# Patient Record
Sex: Male | Born: 1945 | Race: White | Hispanic: No | Marital: Married | Smoking: Former smoker
Health system: Southern US, Community
[De-identification: ages and names within clinical notes are randomized; demographics above are authoritative.]

## PROBLEM LIST (undated history)

## (undated) DIAGNOSIS — L8990 Pressure ulcer of unspecified site, unspecified stage: Secondary | ICD-10-CM

## (undated) DIAGNOSIS — I83009 Varicose veins of unspecified lower extremity with ulcer of unspecified site: Secondary | ICD-10-CM

## (undated) DIAGNOSIS — E785 Hyperlipidemia, unspecified: Secondary | ICD-10-CM

## (undated) DIAGNOSIS — L97909 Non-pressure chronic ulcer of unspecified part of unspecified lower leg with unspecified severity: Secondary | ICD-10-CM

## (undated) DIAGNOSIS — L0390 Cellulitis, unspecified: Secondary | ICD-10-CM

## (undated) DIAGNOSIS — I10 Essential (primary) hypertension: Secondary | ICD-10-CM

## (undated) DIAGNOSIS — R600 Localized edema: Secondary | ICD-10-CM

## (undated) DIAGNOSIS — G4730 Sleep apnea, unspecified: Secondary | ICD-10-CM

## (undated) DIAGNOSIS — I82409 Acute embolism and thrombosis of unspecified deep veins of unspecified lower extremity: Secondary | ICD-10-CM

## (undated) DIAGNOSIS — I4891 Unspecified atrial fibrillation: Secondary | ICD-10-CM

## (undated) DIAGNOSIS — N200 Calculus of kidney: Secondary | ICD-10-CM

## (undated) DIAGNOSIS — E119 Type 2 diabetes mellitus without complications: Secondary | ICD-10-CM

## (undated) DIAGNOSIS — I2721 Secondary pulmonary arterial hypertension: Secondary | ICD-10-CM

## (undated) DIAGNOSIS — R339 Retention of urine, unspecified: Secondary | ICD-10-CM

## (undated) HISTORY — PX: HERNIA REPAIR: SHX51

---

## 1992-04-10 DIAGNOSIS — I82409 Acute embolism and thrombosis of unspecified deep veins of unspecified lower extremity: Secondary | ICD-10-CM

## 1992-04-10 HISTORY — DX: Acute embolism and thrombosis of unspecified deep veins of unspecified lower extremity: I82.409

## 1998-03-11 ENCOUNTER — Ambulatory Visit (HOSPITAL_BASED_OUTPATIENT_CLINIC_OR_DEPARTMENT_OTHER): Admission: RE | Admit: 1998-03-11 | Discharge: 1998-03-11 | Payer: Self-pay | Admitting: Oral Surgery

## 1998-09-08 ENCOUNTER — Ambulatory Visit (HOSPITAL_COMMUNITY): Admission: RE | Admit: 1998-09-08 | Discharge: 1998-09-09 | Payer: Self-pay | Admitting: *Deleted

## 2001-05-16 ENCOUNTER — Encounter: Admission: RE | Admit: 2001-05-16 | Discharge: 2001-05-16 | Payer: Self-pay | Admitting: Internal Medicine

## 2003-06-23 ENCOUNTER — Ambulatory Visit (HOSPITAL_COMMUNITY): Admission: RE | Admit: 2003-06-23 | Discharge: 2003-06-23 | Payer: Self-pay | Admitting: Orthopaedic Surgery

## 2010-06-19 ENCOUNTER — Emergency Department (INDEPENDENT_AMBULATORY_CARE_PROVIDER_SITE_OTHER): Payer: Medicare Other

## 2010-06-19 ENCOUNTER — Emergency Department (HOSPITAL_BASED_OUTPATIENT_CLINIC_OR_DEPARTMENT_OTHER)
Admission: EM | Admit: 2010-06-19 | Discharge: 2010-06-19 | Disposition: A | Discharge status: Home / Self Care | Payer: Medicare Other | Attending: Emergency Medicine | Admitting: Emergency Medicine

## 2010-06-19 DIAGNOSIS — S1093XA Contusion of unspecified part of neck, initial encounter: Secondary | ICD-10-CM | POA: Insufficient documentation

## 2010-06-19 DIAGNOSIS — H0589 Other disorders of orbit: Secondary | ICD-10-CM

## 2010-06-19 DIAGNOSIS — Z79899 Other long term (current) drug therapy: Secondary | ICD-10-CM | POA: Insufficient documentation

## 2010-06-19 DIAGNOSIS — I4891 Unspecified atrial fibrillation: Secondary | ICD-10-CM | POA: Insufficient documentation

## 2010-06-19 DIAGNOSIS — Y92009 Unspecified place in unspecified non-institutional (private) residence as the place of occurrence of the external cause: Secondary | ICD-10-CM | POA: Insufficient documentation

## 2010-06-19 DIAGNOSIS — S0003XA Contusion of scalp, initial encounter: Secondary | ICD-10-CM

## 2010-06-19 DIAGNOSIS — I10 Essential (primary) hypertension: Secondary | ICD-10-CM | POA: Insufficient documentation

## 2010-06-19 DIAGNOSIS — H05819 Cyst of unspecified orbit: Secondary | ICD-10-CM | POA: Insufficient documentation

## 2010-06-19 DIAGNOSIS — W010XXA Fall on same level from slipping, tripping and stumbling without subsequent striking against object, initial encounter: Secondary | ICD-10-CM

## 2010-06-19 DIAGNOSIS — E119 Type 2 diabetes mellitus without complications: Secondary | ICD-10-CM | POA: Insufficient documentation

## 2010-06-19 DIAGNOSIS — W1809XA Striking against other object with subsequent fall, initial encounter: Secondary | ICD-10-CM | POA: Insufficient documentation

## 2010-06-19 DIAGNOSIS — W07XXXA Fall from chair, initial encounter: Secondary | ICD-10-CM

## 2010-06-19 LAB — DIFFERENTIAL
Basophils Absolute: 0.1 10*3/uL (ref 0.0–0.1)
Basophils Relative: 1 % (ref 0–1)
Eosinophils Absolute: 0.1 10*3/uL (ref 0.0–0.7)
Neutro Abs: 8.9 10*3/uL — ABNORMAL HIGH (ref 1.7–7.7)
Neutrophils Relative %: 80 % — ABNORMAL HIGH (ref 43–77)

## 2010-06-19 LAB — COMPREHENSIVE METABOLIC PANEL
ALT: 25 U/L (ref 0–53)
AST: 23 U/L (ref 0–37)
Albumin: 4.3 g/dL (ref 3.5–5.2)
Alkaline Phosphatase: 76 U/L (ref 39–117)
CO2: 26 mEq/L (ref 19–32)
Chloride: 104 mEq/L (ref 96–112)
Creatinine, Ser: 1.1 mg/dL (ref 0.4–1.5)
GFR calc Af Amer: >60 mL/min (ref >60)
Potassium: 4.4 mEq/L (ref 3.5–5.1)
Sodium: 141 mEq/L (ref 135–145)
Total Bilirubin: 1.3 mg/dL — ABNORMAL HIGH (ref 0.3–1.2)

## 2010-06-19 LAB — CBC
Hemoglobin: 14.4 g/dL (ref 13.0–17.0)
Platelets: 321 10*3/uL (ref 150–400)
RBC: 4.99 MIL/uL (ref 4.22–5.81)
WBC: 11.2 10*3/uL — ABNORMAL HIGH (ref 4.0–10.5)

## 2010-06-19 MED ORDER — IOHEXOL 300 MG/ML  SOLN
90.0000 mL | Freq: Once | INTRAMUSCULAR | Status: AC | PRN
  Administered 2010-06-19: 90 mL via INTRAVENOUS

## 2011-01-10 ENCOUNTER — Encounter (HOSPITAL_BASED_OUTPATIENT_CLINIC_OR_DEPARTMENT_OTHER): Payer: Medicare Other

## 2011-02-14 ENCOUNTER — Encounter (HOSPITAL_BASED_OUTPATIENT_CLINIC_OR_DEPARTMENT_OTHER): Payer: Medicare Other

## 2011-10-14 ENCOUNTER — Encounter (HOSPITAL_COMMUNITY): Payer: Self-pay | Admitting: *Deleted

## 2011-10-14 ENCOUNTER — Inpatient Hospital Stay (HOSPITAL_COMMUNITY)
Admission: EM | Admit: 2011-10-14 | Discharge: 2011-10-18 | DRG: 603 | Disposition: A | Discharge status: Home / Self Care | Payer: Medicare Other | Attending: Internal Medicine | Admitting: Internal Medicine

## 2011-10-14 DIAGNOSIS — I83009 Varicose veins of unspecified lower extremity with ulcer of unspecified site: Secondary | ICD-10-CM | POA: Diagnosis present

## 2011-10-14 DIAGNOSIS — Z6841 Body Mass Index (BMI) 40.0 and over, adult: Secondary | ICD-10-CM

## 2011-10-14 DIAGNOSIS — L97909 Non-pressure chronic ulcer of unspecified part of unspecified lower leg with unspecified severity: Secondary | ICD-10-CM | POA: Diagnosis present

## 2011-10-14 DIAGNOSIS — R339 Retention of urine, unspecified: Secondary | ICD-10-CM | POA: Diagnosis not present

## 2011-10-14 DIAGNOSIS — I4891 Unspecified atrial fibrillation: Secondary | ICD-10-CM | POA: Diagnosis present

## 2011-10-14 DIAGNOSIS — E871 Hypo-osmolality and hyponatremia: Secondary | ICD-10-CM | POA: Diagnosis present

## 2011-10-14 DIAGNOSIS — Z86718 Personal history of other venous thrombosis and embolism: Secondary | ICD-10-CM

## 2011-10-14 DIAGNOSIS — I2789 Other specified pulmonary heart diseases: Secondary | ICD-10-CM | POA: Diagnosis present

## 2011-10-14 DIAGNOSIS — N19 Unspecified kidney failure: Secondary | ICD-10-CM

## 2011-10-14 DIAGNOSIS — Z79899 Other long term (current) drug therapy: Secondary | ICD-10-CM

## 2011-10-14 DIAGNOSIS — E872 Acidosis, unspecified: Secondary | ICD-10-CM | POA: Diagnosis present

## 2011-10-14 DIAGNOSIS — E861 Hypovolemia: Secondary | ICD-10-CM | POA: Diagnosis present

## 2011-10-14 DIAGNOSIS — L0390 Cellulitis, unspecified: Secondary | ICD-10-CM

## 2011-10-14 DIAGNOSIS — Z7901 Long term (current) use of anticoagulants: Secondary | ICD-10-CM

## 2011-10-14 DIAGNOSIS — N400 Enlarged prostate without lower urinary tract symptoms: Secondary | ICD-10-CM | POA: Diagnosis present

## 2011-10-14 DIAGNOSIS — I2721 Secondary pulmonary arterial hypertension: Secondary | ICD-10-CM | POA: Diagnosis present

## 2011-10-14 DIAGNOSIS — Z87442 Personal history of urinary calculi: Secondary | ICD-10-CM

## 2011-10-14 DIAGNOSIS — E860 Dehydration: Secondary | ICD-10-CM

## 2011-10-14 DIAGNOSIS — L0291 Cutaneous abscess, unspecified: Secondary | ICD-10-CM

## 2011-10-14 DIAGNOSIS — L03116 Cellulitis of left lower limb: Secondary | ICD-10-CM | POA: Diagnosis present

## 2011-10-14 DIAGNOSIS — R3911 Hesitancy of micturition: Secondary | ICD-10-CM

## 2011-10-14 DIAGNOSIS — L02419 Cutaneous abscess of limb, unspecified: Principal | ICD-10-CM | POA: Diagnosis present

## 2011-10-14 DIAGNOSIS — L03119 Cellulitis of unspecified part of limb: Principal | ICD-10-CM

## 2011-10-14 DIAGNOSIS — E119 Type 2 diabetes mellitus without complications: Secondary | ICD-10-CM | POA: Diagnosis present

## 2011-10-14 DIAGNOSIS — N179 Acute kidney failure, unspecified: Secondary | ICD-10-CM | POA: Diagnosis present

## 2011-10-14 DIAGNOSIS — R600 Localized edema: Secondary | ICD-10-CM | POA: Diagnosis present

## 2011-10-14 DIAGNOSIS — R110 Nausea: Secondary | ICD-10-CM | POA: Diagnosis present

## 2011-10-14 DIAGNOSIS — I959 Hypotension, unspecified: Secondary | ICD-10-CM | POA: Diagnosis present

## 2011-10-14 HISTORY — DX: Calculus of kidney: N20.0

## 2011-10-14 HISTORY — DX: Morbid (severe) obesity due to excess calories: E66.01

## 2011-10-14 HISTORY — DX: Unspecified atrial fibrillation: I48.91

## 2011-10-14 HISTORY — DX: Non-pressure chronic ulcer of unspecified part of unspecified lower leg with unspecified severity: I83.009

## 2011-10-14 HISTORY — DX: Localized edema: R60.0

## 2011-10-14 HISTORY — DX: Type 2 diabetes mellitus without complications: E11.9

## 2011-10-14 HISTORY — DX: Retention of urine, unspecified: R33.9

## 2011-10-14 HISTORY — DX: Acute embolism and thrombosis of unspecified deep veins of unspecified lower extremity: I82.409

## 2011-10-14 HISTORY — DX: Essential (primary) hypertension: I10

## 2011-10-14 HISTORY — DX: Varicose veins of unspecified lower extremity with ulcer of unspecified site: L97.909

## 2011-10-14 HISTORY — DX: Secondary pulmonary arterial hypertension: I27.21

## 2011-10-14 HISTORY — DX: Hyperlipidemia, unspecified: E78.5

## 2011-10-14 LAB — CBC WITH DIFFERENTIAL/PLATELET
Basophils Absolute: 0.1 10*3/uL (ref 0.0–0.1)
Basophils Relative: 0 % (ref 0–1)
Eosinophils Absolute: 0.2 10*3/uL (ref 0.0–0.7)
Hemoglobin: 13.3 g/dL (ref 13.0–17.0)
MCH: 29.7 pg (ref 26.0–34.0)
MCHC: 33.5 g/dL (ref 30.0–36.0)
Monocytes Relative: 6 % (ref 3–12)
Neutrophils Relative %: 82 % — ABNORMAL HIGH (ref 43–77)
RDW: 14.1 % (ref 11.5–15.5)

## 2011-10-14 LAB — BASIC METABOLIC PANEL
BUN: 47 mg/dL — ABNORMAL HIGH (ref 6–23)
Creatinine, Ser: 2.35 mg/dL — ABNORMAL HIGH (ref 0.50–1.35)
GFR calc Af Amer: 32 mL/min — ABNORMAL LOW (ref >90)
GFR calc non Af Amer: 27 mL/min — ABNORMAL LOW (ref >90)
Potassium: 5.1 mEq/L (ref 3.5–5.1)

## 2011-10-14 LAB — TSH: TSH: 1.743 u[IU]/mL (ref 0.350–4.500)

## 2011-10-14 LAB — LIPASE, BLOOD: Lipase: 38 U/L (ref 11–59)

## 2011-10-14 LAB — HEPATIC FUNCTION PANEL
Alkaline Phosphatase: 74 U/L (ref 39–117)
Indirect Bilirubin: 0.3 mg/dL (ref 0.3–0.9)
Total Protein: 7.2 g/dL (ref 6.0–8.3)

## 2011-10-14 LAB — LACTIC ACID, PLASMA: Lactic Acid, Venous: 1.2 mmol/L (ref 0.5–2.2)

## 2011-10-14 MED ORDER — SODIUM CHLORIDE 0.9 % IV SOLN
INTRAVENOUS | Status: DC
  Administered 2011-10-14 – 2011-10-17 (×7): via INTRAVENOUS

## 2011-10-14 MED ORDER — MORPHINE SULFATE 4 MG/ML IJ SOLN
4.0000 mg | Freq: Once | INTRAMUSCULAR | Status: AC
  Administered 2011-10-14: 4 mg via INTRAVENOUS
  Filled 2011-10-14: qty 1

## 2011-10-14 MED ORDER — DILTIAZEM HCL 30 MG PO TABS
30.0000 mg | ORAL_TABLET | Freq: Four times a day (QID) | ORAL | Status: DC
  Administered 2011-10-14 – 2011-10-15 (×5): 30 mg via ORAL
  Filled 2011-10-14 (×6): qty 1

## 2011-10-14 MED ORDER — ALBUTEROL SULFATE (5 MG/ML) 0.5% IN NEBU: 2.5000 mg | INHALATION_SOLUTION | RESPIRATORY_TRACT | Status: DC | PRN

## 2011-10-14 MED ORDER — DOCUSATE SODIUM 100 MG PO CAPS
100.0000 mg | ORAL_CAPSULE | Freq: Two times a day (BID) | ORAL | Status: DC
  Administered 2011-10-14 – 2011-10-18 (×8): 100 mg via ORAL
  Filled 2011-10-14 (×12): qty 1

## 2011-10-14 MED ORDER — VANCOMYCIN HCL 1000 MG IV SOLR
1500.0000 mg | Freq: Once | INTRAVENOUS | Status: AC
  Administered 2011-10-14: 1500 mg via INTRAVENOUS
  Filled 2011-10-14: qty 1500

## 2011-10-14 MED ORDER — ACETAMINOPHEN 650 MG RE SUPP: 650.0000 mg | Freq: Four times a day (QID) | RECTAL | Status: DC | PRN

## 2011-10-14 MED ORDER — OXYCODONE HCL 5 MG PO TABS
5.0000 mg | ORAL_TABLET | ORAL | Status: DC | PRN
  Administered 2011-10-15 – 2011-10-16 (×4): 10 mg via ORAL
  Administered 2011-10-16: 5 mg via ORAL
  Administered 2011-10-17: 10 mg via ORAL
  Administered 2011-10-18: 5 mg via ORAL
  Filled 2011-10-14 (×2): qty 2
  Filled 2011-10-14: qty 1
  Filled 2011-10-14 (×3): qty 2
  Filled 2011-10-14: qty 1
  Filled 2011-10-14: qty 2

## 2011-10-14 MED ORDER — DABIGATRAN ETEXILATE MESYLATE 150 MG PO CAPS
150.0000 mg | ORAL_CAPSULE | Freq: Two times a day (BID) | ORAL | Status: DC
  Administered 2011-10-14 – 2011-10-18 (×8): 150 mg via ORAL
  Filled 2011-10-14 (×12): qty 1

## 2011-10-14 MED ORDER — VANCOMYCIN HCL IN DEXTROSE 1-5 GM/200ML-% IV SOLN
1000.0000 mg | Freq: Once | INTRAVENOUS | Status: AC
  Administered 2011-10-14: 1000 mg via INTRAVENOUS
  Filled 2011-10-14: qty 200

## 2011-10-14 MED ORDER — SODIUM CHLORIDE 0.9 % IV BOLUS (SEPSIS): 250.0000 mL | Freq: Once | INTRAVENOUS | Status: DC

## 2011-10-14 MED ORDER — ONDANSETRON HCL 4 MG PO TABS: 4.0000 mg | ORAL_TABLET | Freq: Four times a day (QID) | ORAL | Status: DC | PRN

## 2011-10-14 MED ORDER — DABIGATRAN ETEXILATE MESYLATE 150 MG PO CAPS
ORAL_CAPSULE | ORAL | Status: AC
  Filled 2011-10-14: qty 1

## 2011-10-14 MED ORDER — ALUM & MAG HYDROXIDE-SIMETH 200-200-20 MG/5ML PO SUSP: 30.0000 mL | Freq: Four times a day (QID) | ORAL | Status: DC | PRN

## 2011-10-14 MED ORDER — ONDANSETRON HCL 4 MG/2ML IJ SOLN: 4.0000 mg | Freq: Four times a day (QID) | INTRAMUSCULAR | Status: DC | PRN

## 2011-10-14 MED ORDER — INSULIN ASPART 100 UNIT/ML ~~LOC~~ SOLN
0.0000 [IU] | Freq: Every day | SUBCUTANEOUS | Status: DC
  Administered 2011-10-14: 2 [IU] via SUBCUTANEOUS

## 2011-10-14 MED ORDER — SIMVASTATIN 10 MG PO TABS
10.0000 mg | ORAL_TABLET | Freq: Every day | ORAL | Status: DC
  Administered 2011-10-14 – 2011-10-18 (×5): 10 mg via ORAL
  Filled 2011-10-14 (×7): qty 1

## 2011-10-14 MED ORDER — SODIUM CHLORIDE 0.9 % IV BOLUS (SEPSIS): 1000.0000 mL | Freq: Once | INTRAVENOUS | Status: DC

## 2011-10-14 MED ORDER — VANCOMYCIN HCL 1000 MG IV SOLR
2000.0000 mg | INTRAVENOUS | Status: DC
  Filled 2011-10-14: qty 2000

## 2011-10-14 MED ORDER — MORPHINE SULFATE 4 MG/ML IJ SOLN
4.0000 mg | INTRAMUSCULAR | Status: DC | PRN
  Administered 2011-10-14 – 2011-10-18 (×4): 4 mg via INTRAVENOUS
  Filled 2011-10-14 (×4): qty 1

## 2011-10-14 MED ORDER — ACETAMINOPHEN 325 MG PO TABS: 650.0000 mg | ORAL_TABLET | Freq: Four times a day (QID) | ORAL | Status: DC | PRN

## 2011-10-14 MED ORDER — ADULT MULTIVITAMIN W/MINERALS CH
1.0000 | ORAL_TABLET | Freq: Every day | ORAL | Status: DC
  Administered 2011-10-14 – 2011-10-18 (×5): 1 via ORAL
  Filled 2011-10-14 (×5): qty 1

## 2011-10-14 MED ORDER — INSULIN ASPART 100 UNIT/ML ~~LOC~~ SOLN
0.0000 [IU] | Freq: Three times a day (TID) | SUBCUTANEOUS | Status: DC
  Administered 2011-10-15 – 2011-10-16 (×2): 2 [IU] via SUBCUTANEOUS
  Administered 2011-10-16 (×2): 1 [IU] via SUBCUTANEOUS
  Administered 2011-10-17: 2 [IU] via SUBCUTANEOUS
  Administered 2011-10-17: 1 [IU] via SUBCUTANEOUS
  Administered 2011-10-17: 4 [IU] via SUBCUTANEOUS
  Administered 2011-10-18: 3 [IU] via SUBCUTANEOUS
  Administered 2011-10-18 (×2): 1 [IU] via SUBCUTANEOUS

## 2011-10-14 NOTE — Progress Notes (Signed)
Pt states that he has "had trouble" with his legs since 2010, but they have been much worse over the past month and a half. Pt states that he recently decided to apply silvadene to both legs with a clean paint brush. Pt has bilateral redness, and 3+ edema. Left lower leg has skin sloughing off, both legs are weeping.

## 2011-10-14 NOTE — Progress Notes (Signed)
Noted to nurse pt hr 130, no resp distress, no chest pain , only increased hr probable due to discomfort. Pt is obese

## 2011-10-14 NOTE — ED Notes (Signed)
Pt states weeping to the legs. NAD. Poor historian.

## 2011-10-14 NOTE — Progress Notes (Signed)
ANTIBIOTIC CONSULT NOTE - INITIAL  Pharmacy Consult for Vancomycin Indication: cellulitis   Allergies  Allergen Reactions  . Furosemide Other (See Comments)    Dizziness , drowsiness    Patient Measurements: Height: 6\' 1"  (185.4 cm) Weight: 355 lb (161.027 kg) IBW/kg (Calculated) : 79.9    Vital Signs: Temp: 97.7 F (36.5 C) (07/06 1315) Temp src: Oral (07/06 1315) BP: 95/53 mmHg (07/06 1502) Pulse Rate: 77  (07/06 1502) Intake/Output from previous day:   Intake/Output from this shift:    Labs:  Basename 10/14/11 1247  WBC 13.5*  HGB 13.3  PLT 394  LABCREA --  CREATININE 2.35*   Estimated Creatinine Clearance: 49.1 ml/min (by C-G formula based on Cr of 2.35). No results found for this basename: VANCOTROUGH:2,VANCOPEAK:2,VANCORANDOM:2,GENTTROUGH:2,GENTPEAK:2,GENTRANDOM:2,TOBRATROUGH:2,TOBRAPEAK:2,TOBRARND:2,AMIKACINPEAK:2,AMIKACINTROU:2,AMIKACIN:2, in the last 72 hours   Microbiology: Recent Results (from the past 720 hour(s))  CULTURE, BLOOD (ROUTINE X 2)     Status: Normal (Preliminary result)   Collection Time   10/14/11  1:55 PM      Component Value Range Status Comment   Specimen Description BLOOD LEFT HAND   Final    Special Requests BOTTLES DRAWN AEROBIC ONLY 4CC   Final    Culture PENDING   Incomplete    Report Status PENDING   Incomplete     Medical History: Past Medical History  Diagnosis Date  . Type 2 diabetes mellitus   . Hypertension   . Bilateral lower extremity edema   . Venous stasis ulcers   . Atrial fibrillation   . Hyperlipidemia   . Morbid obesity   . DVT (deep venous thrombosis) 1994    LLE. Tx with coumadin  . Kidney stones     Medications:  Prescriptions prior to admission  Medication Sig Dispense Refill  . aspirin 325 MG tablet Take 81.25 mg by mouth daily. Takes one-quarter tablet (81.25 mg) daily      . cephALEXin (KEFLEX) 500 MG capsule Take 500 mg by mouth 2 (two) times daily. Started on September 24, 2011      . dabigatran  (PRADAXA) 150 MG CAPS Take 150 mg by mouth every 12 (twelve) hours.      Marland Kitchen diltiazem (TIAZAC) 360 MG 24 hr capsule Take 360 mg by mouth daily.      . Flaxseed, Linseed, (FLAX SEED OIL) 1000 MG CAPS Take 1 capsule by mouth daily.      Marland Kitchen glipiZIDE (GLUCOTROL XL) 10 MG 24 hr tablet Take 10 mg by mouth 2 (two) times daily.      . pioglitazone (ACTOS) 45 MG tablet Take 45 mg by mouth daily.      . pravastatin (PRAVACHOL) 20 MG tablet Take 20 mg by mouth daily.      . quinapril-hydrochlorothiazide (ACCURETIC) 20-12.5 MG per tablet Take 1 tablet by mouth 2 (two) times daily.      . silver sulfADIAZINE (SILVADENE) 1 % cream Apply 1 application topically as needed. Application to legs as needed daily      . Wound Dressings (UNNA-FLEX ELASTIC UNNA BOOT EX) Apply 1 application topically daily.        Assessment: Okay for Protocol Received Vancomycin 1000mg  in ED, Obese DM male with weeping cellulitis Estimated Creatinine Clearance: 49.1 ml/min (by C-G formula based on Cr of 2.35).   Goal of Therapy:  Vancomycin trough = 10-15  Plan:  Additional 1500mg  IV Vancomycin this afternoon for a total load of 2500mg . Vancomycin 2000mg  IV every 24 hours starting tomorrow. Trough at steady state.  Mady Gemma 10/14/2011,5:03 PM

## 2011-10-14 NOTE — ED Provider Notes (Signed)
History  This chart was scribed for Joya Gaskins, MD by Erskine Emery. This patient was seen in room APA03/[facility]03 and the patient's care was started at 11:19AM.  CSN: [id number]  Arrival date & time 10/14/11  1052   First MD Initiated Contact with Patient 10/14/11 1119      Chief Complaint  Patient presents with  . Legs weeping      The history is provided by the patient. No language interpreter was used.    Jonathan Wise is a 66 y.o. male who presents to the Emergency Department complaining of left leg weeping, onset over a month ago. Pt reports he repeatedly soaks through multiple bandages and that recently he attached diapers to his legs to soak up leakage that resulted in deterioration of the skin. Pt reports dizziness, nausea, blurred vision, and weakness as associated symptoms. Pt also reports chronic thirst attributed to DM (drinks multiple bottles of Gatorade without quenched thirst). Pt denies emesis, SOB, and abdominal pain. Pt is currently on antibiotics. Pt has a h/o DM and HTN. Pt is currently on blood thinners, 150 mg of Pradaxa, he is supposed to take 2x/day but takes them 1x/day. Rest improves symptoms Walking worsens symptoms   Dr. Andrey Campanile is the pt's PCP.   Past Medical History  Diagnosis Date  . Diabetes mellitus   . Hypertension   . Poor historian     Past Surgical History  Procedure Date  . Hernia repair     No family history on file.  History  Substance Use Topics  . Smoking status: Never Smoker   . Smokeless tobacco: Not on file  . Alcohol Use: No      Review of Systems  Constitutional: Negative for fever and chills.  Eyes: Positive for visual disturbance.  Respiratory: Negative for shortness of breath.   Gastrointestinal: Positive for nausea. Negative for vomiting and abdominal pain.  Skin: Positive for wound (chronic leg swelling and weeping).  Neurological: Positive for dizziness and weakness.  All other systems reviewed and are  negative.    Allergies  Furosemide  Home Medications  No current outpatient prescriptions on file.  Triage Vitals: Ht 6\' 1"  (1.854 m)  Wt 355 lb (161.027 kg)  BMI 46.84 kg/m2 BP 95/53  Pulse 77  Temp 97.7 F (36.5 C) (Oral)  Resp 18  Ht 6\' 1"  (1.854 m)  Wt 355 lb (161.027 kg)  BMI 46.84 kg/m2  SpO2 99%   Physical Exam CONSTITUTIONAL: Well developed/well nourished HEAD AND FACE: Normocephalic/atraumatic EYES: EOMI/PERRL ENMT: Mucous membranes moist NECK: supple no meningeal signs SPINE:entire spine nontender CV: S1/S2 noted, no murmurs/rubs/gallops noted LUNGS: Lungs are clear to auscultation bilaterally, no apparent distress ABDOMEN: soft, nontender, no rebound or guarding, obese GU:no cva tenderness NEURO: Pt is awake/alert, moves all extremitiesx4 EXTREMITIES: pulses normal, full ROM, bilateral lower extremity edema, with the left greater than the right, redness excoriation and skin breakdown to calf surface, overlying erythema noted.  Distally he has no numbness and foot is warm to touch.  No crepitance noted SKIN: warm, color normal PSYCH: no abnormalities of mood noted   ED Course  Procedures   COORDINATION OF CARE:  12:12PM: I discussed a treatment plan of blood work and admission to the hospital (at least 48 hours, possibly less) with the pt and pt agreed.   2:05 PM BP low but pt does not appear septic, IV fluids ordered and lactate ordered 3:22 PM Vitals improved Lactate normal He responded to fluid He is  nontoxic Doubt deep space infection/necrotizing fascitis Will admit to tele after discussion with triad  MDM  Nursing notes including past medical history and social history reviewed and considered in documentation All labs/vitals reviewed and considered     I personally performed the services described in this documentation, which was scribed in my presence. The recorded information has been reviewed and considered.         Joya Gaskins, MD 10/14/11 [phone]

## 2011-10-14 NOTE — H&P (Addendum)
Jonathan Wise MRN: [id number] DOB/AGE: 67-Feb-1947 67 y.o. Primary Care Physician: Benedetto Goad, M.D. [facility]. Admit date: 10/14/2011 Chief Complaint: Left leg ulcerations, left leg weeping, and pain. HPI: The patient is a 66 year old man with a past medical history significant for venous stasis ulcers of the left leg, remote left leg DVT, diabetes mellitus, and paroxysmal atrial fibrillation and anticoagulated with Pradaxa. He presents today with a chief complaint of worsening left leg weeping, redness, and pain. He has had left leg ulcerations on and off for the past few years. When the drainage and ulcerations worsen, he treats them with Unna boot dressings and antibiotics prescribed by his primary care physician. Recently, he had been prescribed Keflex which he has taken on and off for the past 3 weeks 2-3 times daily. Over the last week to 2 weeks, he has had to change the Unna boot on his left leg on almost a daily basis due to the increased drainage. Before, he could leave it on at least 3-5 days before changing it. When the [employer] was unsuccessful, he bought diapers. After he applied to the diapers to his leg with tape, they were actually effective in absorbing the drainage, but they pulled off a lot of his skin. He has also used Silvadene and a zinc infused ointment over the past week. The drainage has been serous. He denies pustular drainage. He has had some nausea but no vomiting. He has had generalize malaise and generalized weakness. He denies fever, chills, diarrhea, abdominal pain, pain with urination, chest pain, or shortness of breath.   In the emergency department, he was initially hypotensive with a blood pressure of 79/50. Following a liter of IV fluids, his blood pressure improved to 95/53. He is afebrile. His lab data are significant for a serum sodium of 128, potassium of 5.1, BUN of 47, creatinine of 2.35, glucose 145, and WBC of 13.2. His lactic acid is within  normal limits at 1.2. His EKG reveals atrial fibrillation with a heart rate of 105 beats per minute. He is being admitted for further evaluation and management.  Past Medical History  Diagnosis Date  . Type 2 diabetes mellitus   . Hypertension   . Bilateral lower extremity edema   . Venous stasis ulcers   . Atrial fibrillation   . Hyperlipidemia   . Morbid obesity   . DVT (deep venous thrombosis) 1994    LLE. Tx with coumadin  . Kidney stones     Past Surgical History  Procedure Date  . Hernia repair     Umbilical    Prior to Admission medications   Medication Sig Start Date End Date Taking? Authorizing Provider  aspirin 325 MG tablet Take 81.25 mg by mouth daily. Takes one-quarter tablet (81.25 mg) daily   Yes Historical Provider, MD  cephALEXin (KEFLEX) 500 MG capsule Take 500 mg by mouth 2 (two) times daily. Started on September 24, 2011   Yes Historical Provider, MD  dabigatran (PRADAXA) 150 MG CAPS Take 150 mg by mouth every 12 (twelve) hours.   Yes Historical Provider, MD  diltiazem (TIAZAC) 360 MG 24 hr capsule Take 360 mg by mouth daily.   Yes Historical Provider, MD  Flaxseed, Linseed, (FLAX SEED OIL) 1000 MG CAPS Take 1 capsule by mouth daily.   Yes Historical Provider, MD  glipiZIDE (GLUCOTROL XL) 10 MG 24 hr tablet Take 10 mg by mouth 2 (two) times daily.   Yes Historical Provider, MD  pioglitazone (ACTOS) 45 MG  tablet Take 45 mg by mouth daily.   Yes Historical Provider, MD  pravastatin (PRAVACHOL) 20 MG tablet Take 20 mg by mouth daily.   Yes Historical Provider, MD  quinapril-hydrochlorothiazide (ACCURETIC) 20-12.5 MG per tablet Take 1 tablet by mouth 2 (two) times daily.   Yes Historical Provider, MD  silver sulfADIAZINE (SILVADENE) 1 % cream Apply 1 application topically as needed. Application to legs as needed daily   Yes Historical Provider, MD  Wound Dressings (UNNA-FLEX ELASTIC UNNA BOOT EX) Apply 1 application topically daily.   Yes Historical Provider, MD     Allergies:  Allergies  Allergen Reactions  . Furosemide Other (See Comments)    Dizziness , drowsiness   Family history: His mother is deceased. He does not recall the etiology of her death. His father is also deceased. He died of complications of pneumonia and emphysema.  Social History: He is married. He lives in [place]. He stopped smoking in 2005 after smoking 3-4 packs of cigarettes per day. He denies alcohol and illicit drug use. He is a retired [employer].      ROS: As above in history present illness. In addition, his blood sugars have been ranging in the 80s to 90s at home. He has had no problems with urination. He has noticed a slight decrease in his urine output. Otherwise, review of systems is negative.  PHYSICAL EXAM: Blood pressure 95/53, pulse 77, temperature 97.7 F (36.5 C), temperature source Oral, resp. rate 18, height 6\' 1"  (1.854 m), weight 161.027 kg (355 lb), SpO2 99.00%.  General: Pleasant alert morbidly obese 66 year old Caucasian man sitting up in bed, in no acute distress. HEENT: Head is normocephalic, nontraumatic. Pupils are equal, round, and reactive to light. Extraocular movements are intact. Conjunctivae are clear. Sclerae are white. Oropharynx reveals dry mucous membranes. Very poor dentition with cavities and broken off teeth noted. No posterior exudates or erythema. Neck: Supple, no adenopathy, no thyromegaly, no JVD. Lungs: Clear to auscultation bilaterally. Heart: Irregular, irregular, with borderline tachycardia. Abdomen: Morbidly obese, positive bowel sounds, soft, nontender, nondistended. GU and rectal are deferred. Extremities: Left leg with some discoloration from topical Silvadene and brawny skin changes.. Otherwise, multiple superficial and stage II ulcerations on the leg below the knee. General moderate erythema between and over the ulcerations on the left leg below-the-knee. 2+ nonpitting edema. Serous non-malodorous drainage.  Moderately tender to palpation. Left foot with  calluses on the plantar surface and erythema over the dorsal surface, no active drainage from the feet. Right lower extremity with 1+ nonpitting edema and brawny skin changes. Neurologic: He is alert and oriented x3. Cranial nerves II through XII are intact. Strength is 5 over 5 throughout except the left lower extremity which she finds difficult to lift because of the edema and discomfort. Sensation grossly intact throughout. Psychiatric: He has a pleasant affect. He is cooperative. Speech is clear.  Basic Metabolic Panel:  Basename 10/14/11 1247  NA 128*  K 5.1  CL 97  CO2 17*  GLUCOSE 145*  BUN 47*  CREATININE 2.35*  CALCIUM 10.0  MG --  PHOS --   Liver Function Tests: No results found for this basename: AST:2,ALT:2,ALKPHOS:2,BILITOT:2,PROT:2,ALBUMIN:2 in the last 72 hours No results found for this basename: LIPASE:2,AMYLASE:2 in the last 72 hours No results found for this basename: AMMONIA:2 in the last 72 hours CBC:  Basename 10/14/11 1247  WBC 13.5*  NEUTROABS 11.0*  HGB 13.3  HCT 39.7  MCV 88.6  PLT 394   Cardiac Enzymes:  No results found for this basename: CKTOTAL:3,CKMB:3,CKMBINDEX:3,TROPONINI:3 in the last 72 hours BNP: No results found for this basename: PROBNP:3 in the last 72 hours D-Dimer: No results found for this basename: DDIMER:2 in the last 72 hours CBG: No results found for this basename: GLUCAP:6 in the last 72 hours Hemoglobin A1C: No results found for this basename: HGBA1C in the last 72 hours Fasting Lipid Panel: No results found for this basename: CHOL,HDL,LDLCALC,TRIG,CHOLHDL,LDLDIRECT in the last 72 hours Thyroid Function Tests: No results found for this basename: TSH,T4TOTAL,FREET4,T3FREE,THYROIDAB in the last 72 hours Anemia Panel: No results found for this basename: VITAMINB12,FOLATE,FERRITIN,TIBC,IRON,RETICCTPCT in the last 72 hours Coagulation: No results found for this basename:  LABPROT:2,INR:2 in the last 72 hours Urine Drug Screen: Drugs of Abuse  No results found for this basename: labopia, cocainscrnur, labbenz, amphetmu, thcu, labbarb    Alcohol Level: No results found for this basename: ETH:2 in the last 72 hours Urinalysis: No results found for this basename: COLORURINE:2,APPERANCEUR:2,LABSPEC:2,PHURINE:2,GLUCOSEU:2,HGBUR:2,BILIRUBINUR:2,KETONESUR:2,PROTEINUR:2,UROBILINOGEN:2,NITRITE:2,LEUKOCYTESUR:2 in the last 72 hours Misc. Labs:  EKG: Atrial fibrillation with a heart rate of 105 beats per minute.  Recent Results (from the past 240 hour(s))  CULTURE, BLOOD (ROUTINE X 2)     Status: Normal (Preliminary result)   Collection Time   10/14/11  1:55 PM      Component Value Range Status Comment   Specimen Description BLOOD LEFT HAND   Final    Special Requests BOTTLES DRAWN AEROBIC ONLY 4CC   Final    Culture PENDING   Incomplete    Report Status PENDING   Incomplete      Results for orders placed during the hospital encounter of 10/14/11 (from the past 48 hour(s))  CBC WITH DIFFERENTIAL     Status: Abnormal   Collection Time   10/14/11 12:47 PM      Component Value Range Comment   WBC 13.5 (*) 4.0 - 10.5 K/uL    RBC 4.48  4.22 - 5.81 MIL/uL    Hemoglobin 13.3  13.0 - 17.0 g/dL    HCT 16.1  09.6 - 04.5 %    MCV 88.6  78.0 - 100.0 fL    MCH 29.7  26.0 - 34.0 pg    MCHC 33.5  30.0 - 36.0 g/dL    RDW 40.9  81.1 - 91.4 %    Platelets 394  150 - 400 K/uL    Neutrophils Relative 82 (*) 43 - 77 %    Neutro Abs 11.0 (*) 1.7 - 7.7 K/uL    Lymphocytes Relative 11 (*) 12 - 46 %    Lymphs Abs 1.5  0.7 - 4.0 K/uL    Monocytes Relative 6  3 - 12 %    Monocytes Absolute 0.9  0.1 - 1.0 K/uL    Eosinophils Relative 1  0 - 5 %    Eosinophils Absolute 0.2  0.0 - 0.7 K/uL    Basophils Relative 0  0 - 1 %    Basophils Absolute 0.1  0.0 - 0.1 K/uL   BASIC METABOLIC PANEL     Status: Abnormal   Collection Time   10/14/11 12:47 PM      Component Value Range Comment    Sodium 128 (*) 135 - 145 mEq/L    Potassium 5.1  3.5 - 5.1 mEq/L    Chloride 97  96 - 112 mEq/L    CO2 17 (*) 19 - 32 mEq/L    Glucose, Bld 145 (*) 70 - 99 mg/dL    BUN  47 (*) 6 - 23 mg/dL    Creatinine, Ser 4.09 (*) 0.50 - 1.35 mg/dL    Calcium 81.1  8.4 - 10.5 mg/dL    GFR calc non Af Amer 27 (*) >90 mL/min    GFR calc Af Amer 32 (*) >90 mL/min   LACTIC ACID, PLASMA     Status: Normal   Collection Time   10/14/11  1:54 PM      Component Value Range Comment   Lactic Acid, Venous 1.2  0.5 - 2.2 mmol/L   CULTURE, BLOOD (ROUTINE X 2)     Status: Normal (Preliminary result)   Collection Time   10/14/11  1:55 PM      Component Value Range Comment   Specimen Description BLOOD LEFT HAND      Special Requests BOTTLES DRAWN AEROBIC ONLY 4CC      Culture PENDING      Report Status PENDING       No results found.  Impression:  Principal Problem:  *Cellulitis of left leg Active Problems:  Venous stasis ulcers  Bilateral lower extremity edema  Hyponatremia  Hypotension  Hypovolemia  Acute renal failure  Atrial fibrillation  Nausea  Type 2 diabetes mellitus    1. Left lower extremity cellulitis with concomitant acute on chronic venous stasis ulcers and edema. The patient has been self treating with Unna boots. He has also been taking Keflex which was prescribed by his primary care physician 2-3 weeks ago. He is obviously failed outpatient therapy as the ulcerations are weeping/draining more.  2. Hypotension. This is likely secondary to hypovolemia in the setting of antihypertensive medication therapy with an ACE inhibitor and diuretic.  3. Acute renal failure. This is likely secondary to hypovolemia in the setting of ACE inhibitor therapy and diuretic therapy. Also, consider acute renal insufficiency from Keflex and hypoperfusion. Baseline creatinine is unknown, however, the patient reports no history of chronic kidney disease.   4. Chronic atrial fibrillation, chronically  anticoagulated on Pradaxa. He is on diltiazem for rate control. His heart rate is slightly elevated, but this could be mostly secondary to hypovolemia and volume contraction.  5. Type 2 diabetes mellitus. His venous glucose is reasonable. He is treated chronically with glipizide and Actos.  6. Nausea. Likely secondary to overall general malaise, acute renal failure, and cellulitis.    Plan: 1. The patient received a 1 L bolus of normal saline. We'll continue IV fluid hydration with normal saline at 150 cc an hour. 2. We'll hold diltiazem, quinapril, and HCTZ. If his heart rate becomes uncontrolled, will start digoxin or a smaller dose of diltiazem if his blood pressure improves. 3. The patient was given IV vancomycin. We'll continue vancomycin for treatment of cellulitis. 4. We'll hold glipizide and Actos and treat his diabetes with sliding scale NovoLog to avoid symptomatic hypoglycemia. 5. Strict ins and outs. We'll monitor his renal function closely. 6. Symptomatic treatment with analgesics and antiemetics as needed. 7. We'll consult the wound care nurse. 8. For further evaluation, we'll order hepatic function panel, lipase, TSH, hemoglobin A1c, PT/PTT. We'll also order a venous ultrasound of his left leg to rule out DVT.      Yesenia Fontenette 10/14/2011, 4:46 PM

## 2011-10-15 ENCOUNTER — Other Ambulatory Visit (HOSPITAL_COMMUNITY): Payer: Medicare Other

## 2011-10-15 ENCOUNTER — Encounter (HOSPITAL_COMMUNITY): Payer: Self-pay | Admitting: Internal Medicine

## 2011-10-15 ENCOUNTER — Inpatient Hospital Stay (HOSPITAL_COMMUNITY): Payer: Medicare Other

## 2011-10-15 DIAGNOSIS — R3911 Hesitancy of micturition: Secondary | ICD-10-CM

## 2011-10-15 DIAGNOSIS — I4891 Unspecified atrial fibrillation: Secondary | ICD-10-CM

## 2011-10-15 DIAGNOSIS — L97909 Non-pressure chronic ulcer of unspecified part of unspecified lower leg with unspecified severity: Secondary | ICD-10-CM

## 2011-10-15 DIAGNOSIS — R339 Retention of urine, unspecified: Secondary | ICD-10-CM

## 2011-10-15 HISTORY — DX: Retention of urine, unspecified: R33.9

## 2011-10-15 LAB — COMPREHENSIVE METABOLIC PANEL
AST: 32 U/L (ref 0–37)
BUN: 51 mg/dL — ABNORMAL HIGH (ref 6–23)
CO2: 21 mEq/L (ref 19–32)
Chloride: 105 mEq/L (ref 96–112)
Creatinine, Ser: 2.01 mg/dL — ABNORMAL HIGH (ref 0.50–1.35)
GFR calc non Af Amer: 33 mL/min — ABNORMAL LOW (ref >90)
Total Bilirubin: 0.6 mg/dL (ref 0.3–1.2)

## 2011-10-15 LAB — URINALYSIS, ROUTINE W REFLEX MICROSCOPIC
Glucose, UA: NEGATIVE mg/dL
Ketones, ur: NEGATIVE mg/dL
Leukocytes, UA: NEGATIVE
Nitrite: NEGATIVE
Protein, ur: NEGATIVE mg/dL
pH: 5.5 (ref 5.0–8.0)

## 2011-10-15 LAB — CBC
MCH: 28.6 pg (ref 26.0–34.0)
Platelets: 323 10*3/uL (ref 150–400)
RBC: 3.99 MIL/uL — ABNORMAL LOW (ref 4.22–5.81)
WBC: 10.7 10*3/uL — ABNORMAL HIGH (ref 4.0–10.5)

## 2011-10-15 LAB — GLUCOSE, CAPILLARY
Glucose-Capillary: 91 mg/dL (ref 70–99)
Glucose-Capillary: 168 mg/dL — ABNORMAL HIGH (ref 70–99)
Glucose-Capillary: 164 mg/dL — ABNORMAL HIGH (ref 70–99)

## 2011-10-15 LAB — PROTIME-INR: Prothrombin Time: 20.6 seconds — ABNORMAL HIGH (ref 11.6–15.2)

## 2011-10-15 MED ORDER — DIGOXIN 125 MCG PO TABS
0.1250 mg | ORAL_TABLET | Freq: Every day | ORAL | Status: DC
  Filled 2011-10-15: qty 1

## 2011-10-15 MED ORDER — VANCOMYCIN HCL 1000 MG IV SOLR
1500.0000 mg | Freq: Two times a day (BID) | INTRAVENOUS | Status: DC
  Administered 2011-10-15 – 2011-10-16 (×2): 1500 mg via INTRAVENOUS
  Filled 2011-10-15 (×5): qty 1500

## 2011-10-15 MED ORDER — TAMSULOSIN HCL 0.4 MG PO CAPS: 0.4000 mg | ORAL_CAPSULE | Freq: Every day | ORAL | Status: DC

## 2011-10-15 MED ORDER — TAMSULOSIN HCL 0.4 MG PO CAPS
0.4000 mg | ORAL_CAPSULE | Freq: Every day | ORAL | Status: DC
  Administered 2011-10-15 – 2011-10-17 (×3): 0.4 mg via ORAL
  Filled 2011-10-15 (×3): qty 1

## 2011-10-15 MED ORDER — DIGOXIN 0.25 MG/ML IJ SOLN
0.1250 mg | Freq: Once | INTRAMUSCULAR | Status: AC
  Administered 2011-10-15: 0.125 mg via INTRAVENOUS
  Filled 2011-10-15: qty 2

## 2011-10-15 MED ORDER — SODIUM CHLORIDE 0.9 % IJ SOLN
INTRAMUSCULAR | Status: AC
  Administered 2011-10-15: 10:00:00
  Filled 2011-10-15: qty 3

## 2011-10-15 MED ORDER — SODIUM CHLORIDE 0.9 % IJ SOLN
INTRAMUSCULAR | Status: AC
  Administered 2011-10-15: 09:00:00
  Filled 2011-10-15: qty 3

## 2011-10-15 NOTE — Progress Notes (Signed)
Spoke with Dr Sherrie Mustache. Noted elevated heart rate. Added cardizem back but cut dose in half. Now takes 30 mg q 6 hours.

## 2011-10-15 NOTE — Progress Notes (Signed)
Hr 130's pt no chest pain, stated he has afib

## 2011-10-15 NOTE — Progress Notes (Signed)
Pt with low blood pressure, decrease output. Dr. Sherrie Mustache ordered I&O cath, obtained 650cc. Dr. Sherrie Mustache made aware. New order to insert Foley catheter.

## 2011-10-15 NOTE — Progress Notes (Signed)
Subjective: The patient says that the pain in his left leg is tolerable but pain medications are helping. He has had difficulty urinating overnight. He has no pain with urination. Overall, he had a good night sleep.  Objective: Vital signs in last 24 hours: Filed Vitals:   10/14/11 2003 10/14/11 2049 10/15/11 0449 10/15/11 0802  BP:  103/65 91/57   Pulse: 130 111 119 110  Temp:  97.6 F (36.4 C) 97.6 F (36.4 C)   TempSrc:  Oral Oral   Resp: 16 18 18 19   Height:      Weight:      SpO2: 98% 97% 95% 95%    Intake/Output Summary (Last 24 hours) at 10/15/11 1042 Last data filed at 10/15/11 0959  Gross per 24 hour  Intake   2040 ml  Output    350 ml  Net   1690 ml    Weight change:   Physical exam: General: Pleasant alert obese 66 year old Caucasian man sitting up in bed, in no acute distress. Lungs: Decreased breath sounds in the bases, otherwise clear. Heart: Irregular, irregular, with tachycardia. Abdomen: Morbidly obese, positive bowel sounds, soft, nontender, nondistended, but bladder may be full; unable to appreciate this fully due to morbid obesity. Extremities: Sloughed off skin from the multiple ulcerated lesions on the pretibial surfaces and the undersurface of the left leg. Some discoloration from Silvadene. Chronic brawny skin changes. Moderate erythema over the ulcerations and between ulcerations. 2+ nonpitting edema of the left leg. Some serous drainage mixed in with sloughed off skin and scant amount of blood, slightly malodorous. Right lower extremity with only a trace of pedal edema. Pedal pulses not obviously palpable but his feet are warm. Neurologic: He is alert and oriented x3. Cranial nerves II through XII are intact.   Lab Results: Basic Metabolic Panel:  Basename 10/15/11 0656 10/14/11 1247  NA 134* 128*  K 4.9 5.1  CL 105 97  CO2 21 17*  GLUCOSE 96 145*  BUN 51* 47*  CREATININE 2.01* 2.35*  CALCIUM 9.0 10.0  MG -- --  PHOS -- --   Liver  Function Tests:  Basename 10/15/11 0656 10/14/11 1700  AST 32 21  ALT 26 18  ALKPHOS 79 74  BILITOT 0.6 0.5  PROT 6.6 7.2  ALBUMIN 2.8* 2.9*    Basename 10/14/11 1700  LIPASE 38  AMYLASE --   No results found for this basename: AMMONIA:2 in the last 72 hours CBC:  Basename 10/15/11 0656 10/14/11 1247  WBC 10.7* 13.5*  NEUTROABS -- 11.0*  HGB 11.4* 13.3  HCT 36.3* 39.7  MCV 91.0 88.6  PLT 323 394   Cardiac Enzymes: No results found for this basename: CKTOTAL:3,CKMB:3,CKMBINDEX:3,TROPONINI:3 in the last 72 hours BNP: No results found for this basename: PROBNP:3 in the last 72 hours D-Dimer: No results found for this basename: DDIMER:2 in the last 72 hours CBG:  Basename 10/15/11 0719 10/14/11 2048  GLUCAP 91 225*   Hemoglobin A1C:  Basename 10/14/11 1247  HGBA1C 8.0*   Fasting Lipid Panel: No results found for this basename: CHOL,HDL,LDLCALC,TRIG,CHOLHDL,LDLDIRECT in the last 72 hours Thyroid Function Tests:  Basename 10/14/11 1247  TSH 1.743  T4TOTAL --  FREET4 --  T3FREE --  THYROIDAB --   Anemia Panel: No results found for this basename: VITAMINB12,FOLATE,FERRITIN,TIBC,IRON,RETICCTPCT in the last 72 hours Coagulation:  Basename 10/15/11 0656  LABPROT 20.6*  INR 1.73*   Urine Drug Screen: Drugs of Abuse  No results found for this basename: labopia, cocainscrnur, labbenz,  amphetmu, thcu, labbarb    Alcohol Level: No results found for this basename: ETH:2 in the last 72 hours Urinalysis:  Basename 10/15/11 0656  COLORURINE YELLOW  LABSPEC >1.030*  PHURINE 5.5  GLUCOSEU NEGATIVE  HGBUR NEGATIVE  BILIRUBINUR NEGATIVE  KETONESUR NEGATIVE  PROTEINUR NEGATIVE  UROBILINOGEN 1.0  NITRITE NEGATIVE  LEUKOCYTESUR NEGATIVE   Misc. Labs:   Micro: Recent Results (from the past 240 hour(s))  CULTURE, BLOOD (ROUTINE X 2)     Status: Normal (Preliminary result)   Collection Time   10/14/11  1:55 PM      Component Value Range Status Comment    Specimen Description BLOOD LEFT HAND   Final    Special Requests BOTTLES DRAWN AEROBIC ONLY 4CC   Final    Culture PENDING   Incomplete    Report Status PENDING   Incomplete     Studies/Results: US Venous Img Lower Unilateral Left  10/15/2011  *RADIOLOGY REPORT*  Clinical Data: Left leg pain and swelling, ulcerations, history of obesity and diabetes, evaluate for DVT.  LEFT LOWER EXTREMITY VENOUS DUPLEX ULTRASOUND  Technique:  Gray-scale sonography with graded compression, as well as color Doppler and duplex ultrasound were performed to evaluate the deep venous system of the lower extremity from the level of the common femoral vein through the popliteal and proximal calf veins. Spectral Doppler was utilized to evaluate flow at rest and with distal augmentation maneuvers.  Comparison:  None.  Findings:  Examination is degraded secondary to patient body habitus.  Normal compressibility of the common femoral, superficial femoral, and popliteal veins is demonstrated, as well as the visualized proximal calf veins.  No filling defects to suggest DVT on grayscale or color Doppler imaging.  Doppler waveforms show normal direction of venous flow, normal respiratory phasicity and response to augmentation.  IMPRESSION: No evidence of deep vein thrombosis within the left lower extremity.  Original Report Authenticated By: Waynard Reeds, M.D.    Medications:  Scheduled:   . dabigatran  150 mg Oral Q12H  . diltiazem  30 mg Oral QID  . docusate sodium  100 mg Oral BID  . insulin aspart  0-5 Units Subcutaneous QHS  . insulin aspart  0-9 Units Subcutaneous TID WC  .  morphine injection  4 mg Intravenous Once  . multivitamin with minerals  1 tablet Oral Daily  . simvastatin  10 mg Oral q1800  . sodium chloride  1,000 mL Intravenous Once  . sodium chloride  250 mL Intravenous Once  . sodium chloride      . sodium chloride      . vancomycin  1,500 mg Intravenous Once  . vancomycin  1,500 mg Intravenous Q12H    . vancomycin  1,000 mg Intravenous Once  . DISCONTD: vancomycin  2,000 mg Intravenous Q24H   Continuous:   . sodium chloride 150 mL/hr at 10/15/11 0251   KGM:[id number], acetaminophen, albuterol, alum & mag hydroxide-simeth, morphine injection, ondansetron (ZOFRAN) IV, ondansetron, oxyCODONE  Assessment: Principal Problem:  *Cellulitis of left leg Active Problems:  Venous stasis ulcers  Bilateral lower extremity edema  Hyponatremia  Hypotension  Hypovolemia  Acute renal failure  Atrial fibrillation with rapid ventricular response  Nausea  Type 2 diabetes mellitus  Urinary hesitancy    1. Left lower extremity cellulitis with concomitant acute on chronic venous stasis ulcers and edema. We'll continue vancomycin. Wound care consultation was ordered and is pending. We'll continue modest wound care without compressive dressings until the wound care nurse evaluate  the area.  Hypotension. This is secondary to hypovolemia in the setting of antihypertensive medication therapy with an ACE inhibitor and diuretic. He is on IV fluids for hydration. His blood pressure is improving. A lower dose of Cardizem has been started for attempted rate control for atrial fibrillation. Quinapril and hydrochlorothiazide are being held.  Hyponatremia, secondary to hypovolemia. Resolving on normal saline infusion  Chronic atrial fibrillation with rapid ventricular response. His heart rate has increased. Diltiazem was restarted at a lower dose as above. When his blood pressure improves, the dose of diltiazem will be increased. We'll add digoxin.  Type 2 diabetes mellitus. He is treated chronically with glipizide and Actos. His hemoglobin A1c is 8.0 indicating suboptimal control. He is being treated with sliding scale NovoLog only here and his capillary blood glucose is well within normal limits.  Acute renal failure. The patient's urine output has been marginal overnight. His BUN and creatinine have not  improved significantly. Possible bladder outlet obstruction is a concern.  Urinary hesitancy. The patient denied this on admission, but overnight, he has been having difficulty urinating. His urinalysis is not consistent with infection.  Nausea. Resolved. His LFTs and lipase are within normal limits. No abdominal pain on exam.  Plan:  1. Will asked the nursing staff to perform an in and out catheterization to assess for urinary retention. We'll start Flomax each bedtime empirically. 2. Continue IV fluid hydration. 3. We'll give the patient one IV dose of digoxin, followed by daily oral digoxin. Continue every 6 hour dosing of Cardizem at 30 mg. Titrate as his blood pressure improves. 4. Wound consult pending.   LOS: 1 day   Lakina Mcintire 10/15/2011, 10:42 AM

## 2011-10-15 NOTE — Progress Notes (Signed)
ANTIBIOTIC CONSULT NOTE - INITIAL  Pharmacy Consult for Vancomycin Indication: cellulitis   Allergies  Allergen Reactions  . Furosemide Other (See Comments)    Dizziness , drowsiness    Patient Measurements: Height: 6\' 1"  (185.4 cm) Weight: 372 lb 4.8 oz (168.874 kg) (Adjusted to 372 lbs (from erroneous entry of KG).) IBW/kg (Calculated) : 79.9    Vital Signs: Temp: 97.6 F (36.4 C) (07/07 0449) Temp src: Oral (07/07 0449) BP: 91/57 mmHg (07/07 0449) Pulse Rate: 110  (07/07 0802) Intake/Output from previous day: 07/06 0701 - 07/07 0700 In: 2040 [P.O.:240; I.V.:1800] Out: 150 [Urine:150] Intake/Output from this shift:    Labs:  [facility] 10/15/11 0656 10/14/11 1247  WBC 10.7* 13.5*  HGB 11.4* 13.3  PLT 323 394  LABCREA -- --  CREATININE 2.01* 2.35*   Estimated Creatinine Clearance: 59.1 ml/min (by C-G formula based on Cr of 2.01). No results found for this basename: VANCOTROUGH:2,VANCOPEAK:2,VANCORANDOM:2,GENTTROUGH:2,GENTPEAK:2,GENTRANDOM:2,TOBRATROUGH:2,TOBRAPEAK:2,TOBRARND:2,AMIKACINPEAK:2,AMIKACINTROU:2,AMIKACIN:2, in the last 72 hours   Microbiology: Recent Results (from the past 720 hour(s))  CULTURE, BLOOD (ROUTINE X 2)     Status: Normal (Preliminary result)   Collection Time   10/14/11  1:55 PM      Component Value Range Status Comment   Specimen Description BLOOD LEFT HAND   Final    Special Requests BOTTLES DRAWN AEROBIC ONLY 4CC   Final    Culture PENDING   Incomplete    Report Status PENDING   Incomplete     Medical History: Past Medical History  Diagnosis Date  . Type 2 diabetes mellitus   . Hypertension   . Bilateral lower extremity edema   . Venous stasis ulcers   . Atrial fibrillation   . Hyperlipidemia   . Morbid obesity   . DVT (deep venous thrombosis) 1994    LLE. Tx with coumadin  . Kidney stones     Medications:  Prescriptions prior to admission  Medication Sig Dispense Refill  . aspirin 325 MG tablet Take 81.25 mg by mouth  daily. Takes one-quarter tablet (81.25 mg) daily      . cephALEXin (KEFLEX) 500 MG capsule Take 500 mg by mouth 2 (two) times daily. Started on September 24, 2011      . dabigatran (PRADAXA) 150 MG CAPS Take 150 mg by mouth every 12 (twelve) hours.      Marland Kitchen diltiazem (TIAZAC) 360 MG 24 hr capsule Take 360 mg by mouth daily.      . Flaxseed, Linseed, (FLAX SEED OIL) 1000 MG CAPS Take 1 capsule by mouth daily.      Marland Kitchen glipiZIDE (GLUCOTROL XL) 10 MG 24 hr tablet Take 10 mg by mouth 2 (two) times daily.      . pioglitazone (ACTOS) 45 MG tablet Take 45 mg by mouth daily.      . pravastatin (PRAVACHOL) 20 MG tablet Take 20 mg by mouth daily.      . quinapril-hydrochlorothiazide (ACCURETIC) 20-12.5 MG per tablet Take 1 tablet by mouth 2 (two) times daily.      . silver sulfADIAZINE (SILVADENE) 1 % cream Apply 1 application topically as needed. Application to legs as needed daily      . Wound Dressings (UNNA-FLEX ELASTIC UNNA BOOT EX) Apply 1 application topically daily.        Assessment: Okay for Protocol Received Vancomycin 1000mg  in ED, Obese DM male with weeping cellulitis Estimated Creatinine Clearance: 59.1 ml/min (by C-G formula based on Cr of 2.01).   Goal of Therapy:  Vancomycin trough = 10-15  Plan:  Renal Function improved some, will change dose to 1500mg  IV every 12 hours. Trough at steady state.   Mady Gemma 10/15/2011,9:29 AM

## 2011-10-16 ENCOUNTER — Inpatient Hospital Stay (HOSPITAL_COMMUNITY): Payer: Medicare Other

## 2011-10-16 DIAGNOSIS — E872 Acidosis: Secondary | ICD-10-CM | POA: Diagnosis present

## 2011-10-16 LAB — BASIC METABOLIC PANEL
BUN: 34 mg/dL — ABNORMAL HIGH (ref 6–23)
Calcium: 8.9 mg/dL (ref 8.4–10.5)
Creatinine, Ser: 1.03 mg/dL (ref 0.50–1.35)
GFR calc Af Amer: 85 mL/min — ABNORMAL LOW (ref >90)
GFR calc non Af Amer: 74 mL/min — ABNORMAL LOW (ref >90)

## 2011-10-16 LAB — GLUCOSE, CAPILLARY: Glucose-Capillary: 139 mg/dL — ABNORMAL HIGH (ref 70–99)

## 2011-10-16 LAB — PRO B NATRIURETIC PEPTIDE: Pro B Natriuretic peptide (BNP): 695 pg/mL — ABNORMAL HIGH (ref 0–125)

## 2011-10-16 LAB — VANCOMYCIN, TROUGH: Vancomycin Tr: 23.6 ug/mL — ABNORMAL HIGH (ref 10.0–20.0)

## 2011-10-16 MED ORDER — DILTIAZEM HCL 60 MG PO TABS: 60.0000 mg | ORAL_TABLET | Freq: Four times a day (QID) | ORAL | Status: DC

## 2011-10-16 MED ORDER — VANCOMYCIN HCL 1000 MG IV SOLR
2000.0000 mg | INTRAVENOUS | Status: DC
  Administered 2011-10-16 – 2011-10-17 (×2): 2000 mg via INTRAVENOUS
  Filled 2011-10-16 (×3): qty 2000

## 2011-10-16 MED ORDER — DILTIAZEM HCL 60 MG PO TABS
60.0000 mg | ORAL_TABLET | Freq: Four times a day (QID) | ORAL | Status: DC
  Administered 2011-10-16 – 2011-10-17 (×5): 60 mg via ORAL
  Filled 2011-10-16 (×5): qty 1

## 2011-10-16 MED ORDER — METOPROLOL TARTRATE 25 MG PO TABS
25.0000 mg | ORAL_TABLET | Freq: Two times a day (BID) | ORAL | Status: DC
  Administered 2011-10-16 – 2011-10-18 (×4): 25 mg via ORAL
  Filled 2011-10-16 (×4): qty 1

## 2011-10-16 MED ORDER — DIGOXIN 250 MCG PO TABS: 0.2500 mg | ORAL_TABLET | Freq: Every day | ORAL | Status: DC

## 2011-10-16 MED ORDER — DIGOXIN 250 MCG PO TABS
0.2500 mg | ORAL_TABLET | Freq: Every day | ORAL | Status: DC
  Administered 2011-10-16 – 2011-10-18 (×3): 0.25 mg via ORAL
  Filled 2011-10-16 (×3): qty 1

## 2011-10-16 NOTE — Progress Notes (Signed)
PT applied new dressing to patients leg today but it is already weeping through the bandage. Scheduled to be changed on Wednesday but will need to be done each day. Please make sure patient is premedicated for pain prior to changing.

## 2011-10-16 NOTE — Progress Notes (Signed)
Physical Therapy Evaluation Patient Details Name: Jonathan Wise MRN: [id number] DOB: 1946-03-02 Today's Date: 10/16/2011 Time:  -     PT Assessment / Plan / Recommendation Clinical Impression       PT Assessment       Follow Up Recommendations       Barriers to Discharge        Equipment Recommendations       Recommendations for Other Services     Frequency      Precautions / Restrictions     Pertinent Vitals/Pain       Mobility       Exercises     PT Diagnosis:    PT Problem List:   PT Treatment Interventions:     PT Goals    Visit Information       Subjective Data      Prior Functioning       Cognition       Extremity/Trunk Assessment     Balance    End of Session    GP     Konrad Penta 10/16/2011, 4:36 PM

## 2011-10-16 NOTE — Progress Notes (Signed)
ANTIBIOTIC CONSULT NOTE  Pharmacy Consult for Vancomycin Indication: cellulitis   Allergies  Allergen Reactions  . Furosemide Other (See Comments)    Dizziness , drowsiness    Patient Measurements: Height: 6\' 1"  (185.4 cm) Weight: 375 lb 9.6 oz (170.371 kg) IBW/kg (Calculated) : 79.9    Vital Signs: Temp: 97.7 F (36.5 C) (07/08 0528) Temp src: Oral (07/08 0528) BP: 97/60 mmHg (07/08 0528) Pulse Rate: 132  (07/08 0847) Intake/Output from previous day: 07/07 0701 - 07/08 0700 In: 4610 [P.O.:840; I.V.:3270; IV Piggyback:500] Out: 2900 [Urine:2900] Intake/Output from this shift:    Labs:  Basename 10/16/11 0538 10/15/11 0656 10/14/11 1247  WBC -- 10.7* 13.5*  HGB -- 11.4* 13.3  PLT -- 323 394  LABCREA -- -- --  CREATININE 1.03 2.01* 2.35*   Estimated Creatinine Clearance: 115.8 ml/min (by C-G formula based on Cr of 1.03).  Basename 10/16/11 1114  VANCOTROUGH 23.6*  VANCOPEAK --  Drue Dun --  GENTTROUGH --  GENTPEAK --  GENTRANDOM --  TOBRATROUGH --  TOBRAPEAK --  TOBRARND --  AMIKACINPEAK --  AMIKACINTROU --  AMIKACIN --    Microbiology: Recent Results (from the past 720 hour(s))  CULTURE, BLOOD (ROUTINE X 2)     Status: Normal (Preliminary result)   Collection Time   10/14/11  1:55 PM      Component Value Range Status Comment   Specimen Description BLOOD LEFT HAND   Final    Special Requests BOTTLES DRAWN AEROBIC ONLY 4CC   Final    Culture NO GROWTH 2 DAYS   Final    Report Status PENDING   Incomplete   CULTURE, BLOOD (ROUTINE X 2)     Status: Normal (Preliminary result)   Collection Time   10/14/11  2:00 PM      Component Value Range Status Comment   Specimen Description Blood   Final    Special Requests NONE   Final    Culture NO GROWTH 2 DAYS   Final    Report Status PENDING   Incomplete    Assessment: Okay for Protocol Obese DM male with weeping cellulitis Estimated Creatinine Clearance: 115.8 ml/min (by C-G formula based on Cr of  1.03). Elevated Trough Level.  Goal of Therapy:  Vancomycin trough = 10-15  Plan:  Change Vancomycin to 2000mg  IV every 24 hours. Repeat trough later this week if continues.   Mady Gemma 10/16/2011,12:29 PM

## 2011-10-16 NOTE — Progress Notes (Signed)
Subjective: The patient had a Foley catheter inserted yesterday after he was unable to urinate. The Foley catheter drained 650 cc of urine. He has no other complaints currently.  Objective: Vital signs in last 24 hours: Filed Vitals:   10/15/11 1952 10/15/11 2132 10/16/11 0528 10/16/11 0847  BP:  99/62 97/60   Pulse: 132 129 121 132  Temp:  98.1 F (36.7 C) 97.7 F (36.5 C)   TempSrc:  Oral Oral   Resp:  20 20   Height:      Weight:      SpO2: 95% 95% 95% 91%    Intake/Output Summary (Last 24 hours) at 10/16/11 1344 Last data filed at 10/16/11 0700  Gross per 24 hour  Intake   3630 ml  Output   2050 ml  Net   1580 ml    Weight change: 9.344 kg (20 lb 9.6 oz)  Physical exam: General: Pleasant alert obese 66 year old Caucasian man sitting up in bed, in no acute distress. Lungs: Decreased breath sounds in the bases, otherwise clear. Heart: Irregular, irregular, with tachycardia. Abdomen: Morbidly obese, positive bowel sounds, soft, nontender, nondistended, but bladder may be full; unable to appreciate this fully due to morbid obesity. Extremities: Sloughed off skin from the multiple ulcerated lesions on the pretibial surfaces and the undersurface of the left leg. Some discoloration from Silvadene. Chronic brawny skin changes. Less erythema over the ulcerations and between ulcerations, compared to yesterday. 2+ nonpitting edema of the left leg. Some serous drainage mixed in with sloughed off skin and scant amount of blood, slightly malodorous. Right lower extremity with only a trace of pedal edema. Pedal pulses not obviously palpable but his feet are warm. Neurologic: He is alert and oriented x3. Cranial nerves II through XII are intact.   Lab Results: Basic Metabolic Panel:  Basename 10/16/11 0538 10/15/11 0656  NA 137 134*  K 4.7 4.9  CL 108 105  CO2 22 21  GLUCOSE 141* 96  BUN 34* 51*  CREATININE 1.03 2.01*  CALCIUM 8.9 9.0  MG -- --  PHOS -- --   Liver Function  Tests:  Basename 10/15/11 0656 10/14/11 1700  AST 32 21  ALT 26 18  ALKPHOS 79 74  BILITOT 0.6 0.5  PROT 6.6 7.2  ALBUMIN 2.8* 2.9*    Basename 10/14/11 1700  LIPASE 38  AMYLASE --   No results found for this basename: AMMONIA:2 in the last 72 hours CBC:  Basename 10/15/11 0656 10/14/11 1247  WBC 10.7* 13.5*  NEUTROABS -- 11.0*  HGB 11.4* 13.3  HCT 36.3* 39.7  MCV 91.0 88.6  PLT 323 394   Cardiac Enzymes: No results found for this basename: CKTOTAL:3,CKMB:3,CKMBINDEX:3,TROPONINI:3 in the last 72 hours BNP:  Basename 10/16/11 0538  PROBNP 695.0*   D-Dimer: No results found for this basename: DDIMER:2 in the last 72 hours CBG:  Basename 10/16/11 1123 10/16/11 0729 10/15/11 2138 10/15/11 1630 10/15/11 1206 10/15/11 0719  GLUCAP 127* 139* 164* 84 168* 91   Hemoglobin A1C:  Basename 10/14/11 1247  HGBA1C 8.0*   Fasting Lipid Panel: No results found for this basename: CHOL,HDL,LDLCALC,TRIG,CHOLHDL,LDLDIRECT in the last 72 hours Thyroid Function Tests:  Basename 10/14/11 1247  TSH 1.743  T4TOTAL --  FREET4 --  T3FREE --  THYROIDAB --   Anemia Panel: No results found for this basename: VITAMINB12,FOLATE,FERRITIN,TIBC,IRON,RETICCTPCT in the last 72 hours Coagulation:  Basename 10/15/11 0656  LABPROT 20.6*  INR 1.73*   Urine Drug Screen: Drugs of Abuse  No  results found for this basename: labopia,  cocainscrnur,  labbenz,  amphetmu,  thcu,  labbarb    Alcohol Level: No results found for this basename: ETH:2 in the last 72 hours Urinalysis:  Basename 10/15/11 0656  COLORURINE YELLOW  LABSPEC >1.030*  PHURINE 5.5  GLUCOSEU NEGATIVE  HGBUR NEGATIVE  BILIRUBINUR NEGATIVE  KETONESUR NEGATIVE  PROTEINUR NEGATIVE  UROBILINOGEN 1.0  NITRITE NEGATIVE  LEUKOCYTESUR NEGATIVE   Misc. Labs:   Micro: Recent Results (from the past 240 hour(s))  CULTURE, BLOOD (ROUTINE X 2)     Status: Normal (Preliminary result)   Collection Time   10/14/11  1:55 PM       Component Value Range Status Comment   Specimen Description BLOOD LEFT HAND   Final    Special Requests BOTTLES DRAWN AEROBIC ONLY 4CC   Final    Culture NO GROWTH 2 DAYS   Final    Report Status PENDING   Incomplete   CULTURE, BLOOD (ROUTINE X 2)     Status: Normal (Preliminary result)   Collection Time   10/14/11  2:00 PM      Component Value Range Status Comment   Specimen Description Blood   Final    Special Requests NONE   Final    Culture NO GROWTH 2 DAYS   Final    Report Status PENDING   Incomplete     Studies/Results: US Venous Img Lower Unilateral Left  10/15/2011  *RADIOLOGY REPORT*  Clinical Data: Left leg pain and swelling, ulcerations, history of obesity and diabetes, evaluate for DVT.  LEFT LOWER EXTREMITY VENOUS DUPLEX ULTRASOUND  Technique:  Gray-scale sonography with graded compression, as well as color Doppler and duplex ultrasound were performed to evaluate the deep venous system of the lower extremity from the level of the common femoral vein through the popliteal and proximal calf veins. Spectral Doppler was utilized to evaluate flow at rest and with distal augmentation maneuvers.  Comparison:  None.  Findings:  Examination is degraded secondary to patient body habitus.  Normal compressibility of the common femoral, superficial femoral, and popliteal veins is demonstrated, as well as the visualized proximal calf veins.  No filling defects to suggest DVT on grayscale or color Doppler imaging.  Doppler waveforms show normal direction of venous flow, normal respiratory phasicity and response to augmentation.  IMPRESSION: No evidence of deep vein thrombosis within the left lower extremity.  Original Report Authenticated By: Waynard Reeds, M.D.    Medications:  Scheduled:    . dabigatran  150 mg Oral Q12H  . digoxin  0.25 mg Oral Daily  . diltiazem  60 mg Oral QID  . docusate sodium  100 mg Oral BID  . insulin aspart  0-5 Units Subcutaneous QHS  . insulin aspart  0-9  Units Subcutaneous TID WC  . multivitamin with minerals  1 tablet Oral Daily  . simvastatin  10 mg Oral q1800  . sodium chloride  1,000 mL Intravenous Once  . sodium chloride  250 mL Intravenous Once  . Tamsulosin HCl  0.4 mg Oral QHS  . vancomycin  2,000 mg Intravenous Q24H  . DISCONTD: digoxin  0.125 mg Oral Daily  . DISCONTD: digoxin  0.25 mg Oral Daily  . DISCONTD: diltiazem  30 mg Oral QID  . DISCONTD: diltiazem  60 mg Oral QID  . DISCONTD: vancomycin  1,500 mg Intravenous Q12H   Continuous:    . sodium chloride 150 mL/hr at 10/16/11 0820   ZOX:[id number], acetaminophen, albuterol, alum &  mag hydroxide-simeth, morphine injection, ondansetron (ZOFRAN) IV, ondansetron, oxyCODONE  Assessment: Principal Problem:  *Cellulitis of left leg Active Problems:  Venous stasis ulcers  Bilateral lower extremity edema  Hyponatremia  Hypotension  Hypovolemia  Acute renal failure  Atrial fibrillation with rapid ventricular response  Nausea  Type 2 diabetes mellitus  Urinary hesitancy    1. Left lower extremity cellulitis with concomitant acute on chronic venous stasis ulcers and edema. We'll continue vancomycin. Wound care consultation and recommendations noted and appreciated. Continue wound care as recommended.  Hypotension. This is secondary to hypovolemia in the setting of antihypertensive medication therapy with an ACE inhibitor and diuretic. He is on IV fluids for hydration. His blood pressure is improving. A lower dose of Cardizem has been started for attempted rate control for atrial fibrillation. Quinapril and hydrochlorothiazide are being held.  Hyponatremia, secondary to hypovolemia. Resolved on normal saline infusion  Chronic atrial fibrillation with rapid ventricular response. His heart rate has increased. Diltiazem was restarted at a lower dose as above. When his blood pressure improves, the dose of diltiazem will be increased. Added digoxin.  Type 2 diabetes  mellitus. He is treated chronically with glipizide and Actos. His hemoglobin A1c is 8.0 indicating suboptimal control. He is being treated with sliding scale NovoLog only here and his capillary blood glucose is well within normal limits.  Acute renal failure/urinary retention. Status post insertion of Foley catheter. Urine output is now excellent. Creatinine has normalized. Flomax was started.  His urinalysis is not consistent with infection.  Metabolic acidosis secondary to infection and acute renal failure. Resolved.  Nausea. Resolved. His LFTs and lipase are within normal limits. No abdominal pain on exam.  Plan:  1. [facility] consult urology. 2. Will increase the dose of diltiazem to 60 mg every 6 hours. When his blood pressure improves consistently, will change diltiazem to once daily dosing. 3. Will increase the dose of digoxin. 4. Arterial Doppler ordered for evaluation of his arterial system. 5. Ordered a 2-D echocardiogram to assess LV function in light of atrial fibrillation and relative hypotension.   LOS: 2 days   Deangleo Passage 10/16/2011, 1:44 PM

## 2011-10-16 NOTE — Consult Note (Signed)
WOC consult Note Reason for Consult: LLE open wounds Wound type:Venous insufficiency, long standing (chronic), with exacerbation Pressure Ulcer POA: No Measurement:Denuded area in gaiter region secondary to brief trial using baby diapers as a management strategy for LE weeping.  Exacerbation of chronic problem.  Some erythema as well as new (per patient) ulcerations noted in a 17cm x 28cm x .2cm field. Patient has managed this condition with Unna's boots (wife and daughter apply) for some time (greater than 4 years) in the past, but lately his exudate has overwhelmed the Unna's boots. Wound WUJ:WJXBJ, red, irregular margins, moist Drainage (amount, consistency, odor) Serous Periwound:Erythematous and with edema, approximately 2+; dry skin with hemosiderin staining Dressing procedure/placement/frequency:Wound seen in conjunction with PT today for multidisciplinary approach in developing this patient's POC.  We concur that compression therapy is indicated, but also that Unna's boot cannot support the edema and exudate with which the patient presents today.  Recommend placement of a non-contact layer over the denuded area, followed by placement of a 4-layer compression product (Profore) for maximum absorbency.  Initial change frequency will be twice weekly (Monday/Thursday), but this first application is certain to express a fair amount of exudate and may require a dressing change prior to Thursday (i.e., Wednesday).  It is additionally recommended that patient have HHRN for twice weekly dressing changes until the LLE edema is in better control and wounds have decreased in size. I will not follow, but will remain available to my PT colleague, this patient and his nursing and medical teams.  Please re-consult if needed. Thanks, Ladona Mow, MSN, RN, [facility], CWOCN [phone])

## 2011-10-16 NOTE — Progress Notes (Signed)
Called urology consult into office to find that Dr. Jerre Simon is out of town until tomorrow (10/17/11). Will pass on in report for RN to follow-up tomorrow. Schonewitz, Candelaria Stagers 10/16/2011

## 2011-10-16 NOTE — Progress Notes (Signed)
2D Echo will have to be performed when heart rate is less than 130 per [place] tech.  HR is consistently 130-135, md aware.  [place] tech said she would try back tomorrow.  Will continue to monitor. Schonewitz, Candelaria Stagers 10/16/2011

## 2011-10-17 ENCOUNTER — Encounter (HOSPITAL_COMMUNITY): Payer: Self-pay | Admitting: Internal Medicine

## 2011-10-17 LAB — GLUCOSE, CAPILLARY
Glucose-Capillary: 127 mg/dL — ABNORMAL HIGH (ref 70–99)
Glucose-Capillary: 172 mg/dL — ABNORMAL HIGH (ref 70–99)
Glucose-Capillary: 172 mg/dL — ABNORMAL HIGH (ref 70–99)

## 2011-10-17 LAB — BASIC METABOLIC PANEL
BUN: 22 mg/dL (ref 6–23)
CO2: 24 mEq/L (ref 19–32)
Chloride: 108 mEq/L (ref 96–112)
Creatinine, Ser: 0.83 mg/dL (ref 0.50–1.35)
GFR calc Af Amer: >90 mL/min (ref >90)
Glucose, Bld: 141 mg/dL — ABNORMAL HIGH (ref 70–99)
Potassium: 4.7 mEq/L (ref 3.5–5.1)

## 2011-10-17 LAB — URINE CULTURE: Colony Count: NO GROWTH

## 2011-10-17 MED ORDER — DILTIAZEM HCL ER COATED BEADS 180 MG PO CP24
360.0000 mg | ORAL_CAPSULE | Freq: Every day | ORAL | Status: DC
  Administered 2011-10-17 – 2011-10-18 (×2): 360 mg via ORAL
  Filled 2011-10-17 (×2): qty 2

## 2011-10-17 NOTE — Progress Notes (Signed)
Subjective: The patient has no new complaints. Awaiting urology to see him.  Objective: Vital signs in last 24 hours: Filed Vitals:   10/16/11 0847 10/16/11 1401 10/17/11 0701 10/17/11 1429  BP:  114/68 134/77 127/86  Pulse: 132 132 133 119  Temp:  98.2 F (36.8 C) 98.2 F (36.8 C) 97.8 F (36.6 C)  TempSrc:  Oral Oral   Resp:  20 20 20   Height:      Weight:      SpO2: 91% 96% 95% 96%    Intake/Output Summary (Last 24 hours) at 10/17/11 1443 Last data filed at 10/17/11 1300  Gross per 24 hour  Intake    120 ml  Output   3450 ml  Net  -3330 ml    Weight change:   Physical exam: General: Pleasant alert obese 66 year old Caucasian man sitting up in bed, in no acute distress. Lungs: Decreased breath sounds in the bases, otherwise clear. Heart: Irregular, irregular, with tachycardia. Abdomen: Morbidly obese, positive bowel sounds, soft, nontender, nondistended,  Extremities: Compressive dressing in place on the left lower extremity. Trace of edema on the right lower extremity. Neurologic: He is alert and oriented x3. Cranial nerves II through XII are intact.  Lab Results: Basic Metabolic Panel:  Basename 10/17/11 0606 10/16/11 0538  NA 138 137  K 4.7 4.7  CL 108 108  CO2 24 22  GLUCOSE 141* 141*  BUN 22 34*  CREATININE 0.83 1.03  CALCIUM 9.1 8.9  MG -- --  PHOS -- --   Liver Function Tests:  Basename 10/15/11 0656 10/14/11 1700  AST 32 21  ALT 26 18  ALKPHOS 79 74  BILITOT 0.6 0.5  PROT 6.6 7.2  ALBUMIN 2.8* 2.9*    Basename 10/14/11 1700  LIPASE 38  AMYLASE --   No results found for this basename: AMMONIA:2 in the last 72 hours CBC:  Basename 10/15/11 0656  WBC 10.7*  NEUTROABS --  HGB 11.4*  HCT 36.3*  MCV 91.0  PLT 323   Cardiac Enzymes: No results found for this basename: CKTOTAL:3,CKMB:3,CKMBINDEX:3,TROPONINI:3 in the last 72 hours BNP:  Basename 10/16/11 0538  PROBNP 695.0*   D-Dimer: No results found for this basename: DDIMER:2  in the last 72 hours CBG:  Basename 10/17/11 1111 10/17/11 0733 10/16/11 2102 10/16/11 1634 10/16/11 1123 10/16/11 0729  GLUCAP 172* 127* 170* 164* 127* 139*   Hemoglobin A1C: No results found for this basename: HGBA1C in the last 72 hours Fasting Lipid Panel: No results found for this basename: CHOL,HDL,LDLCALC,TRIG,CHOLHDL,LDLDIRECT in the last 72 hours Thyroid Function Tests: No results found for this basename: TSH,T4TOTAL,FREET4,T3FREE,THYROIDAB in the last 72 hours Anemia Panel: No results found for this basename: VITAMINB12,FOLATE,FERRITIN,TIBC,IRON,RETICCTPCT in the last 72 hours Coagulation:  Basename 10/15/11 0656  LABPROT 20.6*  INR 1.73*   Urine Drug Screen: Drugs of Abuse  No results found for this basename: labopia,  cocainscrnur,  labbenz,  amphetmu,  thcu,  labbarb    Alcohol Level: No results found for this basename: ETH:2 in the last 72 hours Urinalysis:  Basename 10/15/11 0656  COLORURINE YELLOW  LABSPEC >1.030*  PHURINE 5.5  GLUCOSEU NEGATIVE  HGBUR NEGATIVE  BILIRUBINUR NEGATIVE  KETONESUR NEGATIVE  PROTEINUR NEGATIVE  UROBILINOGEN 1.0  NITRITE NEGATIVE  LEUKOCYTESUR NEGATIVE   Misc. Labs:   Micro: Recent Results (from the past 240 hour(s))  CULTURE, BLOOD (ROUTINE X 2)     Status: Normal (Preliminary result)   Collection Time   10/14/11  1:55 PM  Component Value Range Status Comment   Specimen Description BLOOD LEFT HAND   Final    Special Requests BOTTLES DRAWN AEROBIC ONLY 4CC   Final    Culture NO GROWTH 3 DAYS   Final    Report Status PENDING   Incomplete   CULTURE, BLOOD (ROUTINE X 2)     Status: Normal (Preliminary result)   Collection Time   10/14/11  2:00 PM      Component Value Range Status Comment   Specimen Description BLOOD LEFT HAND   Final    Special Requests BOTTLES DRAWN AEROBIC ONLY 4CC   Final    Culture NO GROWTH 3 DAYS   Final    Report Status PENDING   Incomplete   URINE CULTURE     Status: Normal   Collection  Time   10/15/11  6:56 AM      Component Value Range Status Comment   Specimen Description URINE, CLEAN CATCH   Final    Special Requests NONE   Final    Culture  Setup Time 10/15/2011 19:37   Final    Colony Count NO GROWTH   Final    Culture NO GROWTH   Final    Report Status 10/17/2011 FINAL   Final     Studies/Results: US Arterial Seg Single  10/16/2011  *RADIOLOGY REPORT*  Clinical Data: Left sided posterior calf ulcer, bilateral lower extremity erythema, history of hypertension, hyperlipidemia, obesity, diabetes, prior history of smoking.  Bilateral lower extremity rest pain.  Evaluate for PAD.  ULTRASOUND ANKLE/BRACHIAL INDICES BILATERAL  Comparison: Left lower extremity venous Doppler ultrasound - 10/15/2011  Findings:  Right lower extremity - ABI - 1.28  Left lower extremity - A B I - 1.5  Wave forms are grossly symmetric bilaterally.  IMPRESSION: 1.  No evidence of vasoocclusive disease within either lower extremity. 2.  Mildly elevated ABIs bilaterally, left greater than right, most suggestive of small-vessel calcification likely secondary to reported history of diabetes.  Original Report Authenticated By: Waynard Reeds, M.D.    Medications:  Scheduled:    . dabigatran  150 mg Oral Q12H  . digoxin  0.25 mg Oral Daily  . diltiazem  360 mg Oral Daily  . docusate sodium  100 mg Oral BID  . insulin aspart  0-5 Units Subcutaneous QHS  . insulin aspart  0-9 Units Subcutaneous TID WC  . metoprolol tartrate  25 mg Oral BID  . multivitamin with minerals  1 tablet Oral Daily  . simvastatin  10 mg Oral q1800  . sodium chloride  1,000 mL Intravenous Once  . sodium chloride  250 mL Intravenous Once  . Tamsulosin HCl  0.4 mg Oral QHS  . vancomycin  2,000 mg Intravenous Q24H  . DISCONTD: diltiazem  60 mg Oral QID   Continuous:    . sodium chloride 100 mL/hr at 10/17/11 4098   JXB:[id number], acetaminophen, albuterol, alum & mag hydroxide-simeth, morphine injection, ondansetron  (ZOFRAN) IV, ondansetron, oxyCODONE  Assessment: Principal Problem:  *Cellulitis of left leg Active Problems:  Venous stasis ulcers  Bilateral lower extremity edema  Hyponatremia  Hypotension  Hypovolemia  Acute renal failure  Atrial fibrillation with rapid ventricular response  Nausea  Type 2 diabetes mellitus  Urinary hesitancy  Metabolic acidosis    1. Left lower extremity cellulitis with concomitant acute on chronic venous stasis ulcers and edema. We'll continue vancomycin. Wound care consultation and recommendations noted and appreciated. Continue wound care as recommended. Of note, arterial ultrasound  revealed peripheral vascular disease.  Hypotension. Resolved following IV fluid hydration.   Hyponatremia, secondary to hypovolemia. Resolved on normal saline infusion  Chronic atrial fibrillation with rapid ventricular response. His heart rate is still above 100 beats per minute. Full dose diltiazem will be restarted. We'll continue digoxin and metoprolol added yesterday.  Type 2 diabetes mellitus. He is treated chronically with glipizide and Actos. His hemoglobin A1c is 8.0 indicating suboptimal control. He is being treated with sliding scale NovoLog only here and his capillary blood glucose is well within normal limits.  Acute renal failure/urinary retention. Status post insertion of Foley catheter. Urine output is now excellent. Creatinine has normalized. Flomax was started.  His urinalysis is not consistent with infection.  Metabolic acidosis secondary to infection and acute renal failure. Resolved.  Nausea. Resolved. His LFTs and lipase are within normal limits. No abdominal pain on exam.  Plan:  1. urology consultation pending. 2. Changed diltiazem to 360 mg daily. 3. Decreased IV fluids. 4. We'll check the results of the 2-D echocardiogram pending. 5. Possible discharge tomorrow with home health assistance for dressing changes.    LOS: 3 days    Jonathan Wise 10/17/2011, 2:43 PM

## 2011-10-17 NOTE — Progress Notes (Signed)
*  PRELIMINARY RESULTS* Echocardiogram 2D Echocardiogram has been performed.  Jonathan Wise 10/17/2011, 1:43 PM

## 2011-10-17 NOTE — Care Management Note (Signed)
    Page 1 of 1   10/18/2011     4:03:48 PM   CARE MANAGEMENT NOTE 10/18/2011  Patient:  ZEESHAN, KORTE   Account Number:  [id number]  Date Initiated:  10/17/2011  Documentation initiated by:  Rosemary Holms  Subjective/Objective Assessment:   Pt admitted from home where he lives with his wife. His wife has been doing his dressing changes PTA. Admitted with L. Leg cellulitis.     Action/Plan:   Plan to DC home with [facility] RN and wound care. Per husband, wife would be able to learn dressing change technique. Lives in [place]   Anticipated DC Date:  10/18/2011   Anticipated DC Plan:  HOME W HOME HEALTH SERVICES      DC Planning Services  CM consult      Choice offered to / List presented to:          [facility] arranged  HH-1 RN  HH-10 DISEASE MANAGEMENT      HH agency  [facility]   Status of service:  Completed, signed off Medicare Important Message given?   (If response is "NO", the following Medicare IM given date fields will be blank) Date Medicare IM given:   Date Additional Medicare IM given:    Discharge Disposition:  HOME W HOME HEALTH SERVICES  Per UR Regulation:    If discussed at Long Length of Stay Meetings, dates discussed:    Comments:  10/17/11 1300 Raziah Funnell Leanord Hawking RN BSN CM

## 2011-10-17 NOTE — Progress Notes (Signed)
Pt was seen twice today to inspect Profore wrap on LLE.  There is NO drainage weeping through the bandage as mentioned in RN note from last night.  The Profore wrap has some absorbent layers underneath the [place] outer wrap which have absorbed drainage, but it is not so excessive that the bandage should be changed.  I will be inspecting the bandage daily and changing it as needed or on Thursday.

## 2011-10-18 ENCOUNTER — Encounter (HOSPITAL_COMMUNITY)
Admission: EM | Disposition: A | Discharge status: Home / Self Care | Payer: Self-pay | Source: Home / Self Care | Attending: Internal Medicine

## 2011-10-18 ENCOUNTER — Encounter (HOSPITAL_COMMUNITY): Payer: Self-pay | Admitting: Internal Medicine

## 2011-10-18 DIAGNOSIS — R339 Retention of urine, unspecified: Secondary | ICD-10-CM

## 2011-10-18 DIAGNOSIS — I2721 Secondary pulmonary arterial hypertension: Secondary | ICD-10-CM | POA: Diagnosis present

## 2011-10-18 HISTORY — PX: CYSTOSCOPY: SHX5120

## 2011-10-18 LAB — SURGICAL PCR SCREEN: Staphylococcus aureus: NEGATIVE

## 2011-10-18 LAB — GLUCOSE, CAPILLARY
Glucose-Capillary: 135 mg/dL — ABNORMAL HIGH (ref 70–99)
Glucose-Capillary: 131 mg/dL — ABNORMAL HIGH (ref 70–99)
Glucose-Capillary: 228 mg/dL — ABNORMAL HIGH (ref 70–99)

## 2011-10-18 LAB — BASIC METABOLIC PANEL
Calcium: 9.4 mg/dL (ref 8.4–10.5)
GFR calc Af Amer: >90 mL/min (ref >90)
GFR calc non Af Amer: >90 mL/min (ref >90)
Glucose, Bld: 143 mg/dL — ABNORMAL HIGH (ref 70–99)
Potassium: 4.6 mEq/L (ref 3.5–5.1)
Sodium: 136 mEq/L (ref 135–145)

## 2011-10-18 SURGERY — CYSTOSCOPY, FLEXIBLE
Anesthesia: LOCAL | Site: Bladder | Wound class: Clean Contaminated

## 2011-10-18 MED ORDER — LIDOCAINE HCL 2 % EX GEL
CUTANEOUS | Status: DC | PRN
  Administered 2011-10-18: 1 via URETHRAL

## 2011-10-18 MED ORDER — LIDOCAINE HCL (CARDIAC) 20 MG/ML IV SOLN
INTRAVENOUS | Status: AC
  Filled 2011-10-18: qty 5

## 2011-10-18 MED ORDER — OXYCODONE-ACETAMINOPHEN 5-325 MG PO TABS: 1.0000 | ORAL_TABLET | ORAL | Status: AC | PRN

## 2011-10-18 MED ORDER — DIGOXIN 125 MCG PO TABS: 0.1250 mg | ORAL_TABLET | Freq: Every day | ORAL | Status: DC

## 2011-10-18 MED ORDER — STERILE WATER FOR IRRIGATION IR SOLN
Status: DC | PRN
  Administered 2011-10-18: 1000 mL

## 2011-10-18 MED ORDER — SODIUM CHLORIDE 0.9 % IR SOLN
Status: DC | PRN
  Administered 2011-10-18: 3000 mL

## 2011-10-18 MED ORDER — AMOXICILLIN 500 MG PO CAPS: 500.0000 mg | ORAL_CAPSULE | Freq: Two times a day (BID) | ORAL | Status: AC

## 2011-10-18 MED ORDER — POLYETHYLENE GLYCOL 3350 17 G PO PACK: 17.0000 g | PACK | Freq: Every day | ORAL | Status: AC

## 2011-10-18 SURGICAL SUPPLY — 21 items
CLOTH BEACON ORANGE TIMEOUT ST (SAFETY) ×2 IMPLANT
DRAPE LAPAROTOMY TRNSV 102X78 (DRAPE) ×2 IMPLANT
DRAPE PROXIMA HALF (DRAPES) ×2 IMPLANT
GLOVE BIO SURGEON STRL SZ7 (GLOVE) ×2 IMPLANT
GLOVE BIOGEL PI IND STRL 6.5 (GLOVE) ×1 IMPLANT
GLOVE BIOGEL PI IND STRL 7.0 (GLOVE) ×1 IMPLANT
GLOVE BIOGEL PI INDICATOR 6.5 (GLOVE) ×1
GLOVE BIOGEL PI INDICATOR 7.0 (GLOVE) ×1
GLOVE EXAM NITRILE MD LF STRL (GLOVE) ×2 IMPLANT
GLOVE OPTIFIT SS 6.5 STRL BRWN (GLOVE) ×2 IMPLANT
GOWN STRL REIN XL XLG (GOWN DISPOSABLE) ×4 IMPLANT
IV NS IRRIG 3000ML ARTHROMATIC (IV SOLUTION) ×2 IMPLANT
MARKER SKIN DUAL TIP RULER LAB (MISCELLANEOUS) ×2 IMPLANT
SET IRRIG Y TYPE TUR BLADDER L (SET/KITS/TRAYS/PACK) ×2 IMPLANT
SPONGE GAUZE 4X4 12PLY (GAUZE/BANDAGES/DRESSINGS) ×2 IMPLANT
SYR 5ML LL (SYRINGE) ×2 IMPLANT
TOWEL OR 17X26 4PK STRL BLUE (TOWEL DISPOSABLE) ×2 IMPLANT
TRAY FOLEY CATH 14FR (SET/KITS/TRAYS/PACK) IMPLANT
VALVE DISPOSABLE (MISCELLANEOUS) ×2 IMPLANT
WATER STERILE IRR 1000ML POUR (IV SOLUTION) ×2 IMPLANT
YANKAUER SUCT BULB TIP 10FT TU (MISCELLANEOUS) ×2 IMPLANT

## 2011-10-18 NOTE — Consult Note (Signed)
[phone]

## 2011-10-18 NOTE — Brief Op Note (Signed)
10/14/2011 - 10/18/2011  2:51 PM  PATIENT:  Jonathan Wise  66 y.o. male  PRE-OPERATIVE DIAGNOSIS:  urinary retention  POST-OPERATIVE DIAGNOSIS:  urinary retention  PROCEDURE:  Procedure(s) (LRB): CYSTOSCOPY FLEXIBLE (N/A)  SURGEON:  Surgeon(s) and Role:    * Ky Barban, MD - Primary  PHYSICIAN ASSISTANT:   ASSISTANTS: none   ANESTHESIA:   local  EBL:  Total I/O In: -  Out: 300 [Urine:300]  BLOOD ADMINISTERED:none  DRAINS: none   LOCAL MEDICATIONS USED:  NONE  SPECIMEN:  No Specimen  DISPOSITION OF SPECIMEN:  N/A  COUNTS:  YES  TOURNIQUET:  * No tourniquets in log *  DICTATION: .Other Dictation: Dictation Number dictation [phone]  PLAN OF CARE: Admit to inpatient   PATIENT DISPOSITION:  PACU - hemodynamically stable.   Delay start of Pharmacological VTE agent (>24hrs) due to surgical blood loss or risk of bleeding: 2

## 2011-10-18 NOTE — Discharge Summary (Signed)
Physician Discharge Summary  Jonathan Wise MRN: [id number] DOB/AGE: 1945/11/15 66 y.o.  PCP: Benedetto Goad, M.D.   Admit date: 10/14/2011 Discharge date: 10/18/2011  Discharge Diagnoses:  1. Cellulitis of the left leg with concomitant chronic venous stasis ulcers.     Left lower extremity venous ultrasound revealed no DVT. Bilateral lower extremity arterial ultrasound revealed no evidence of PVD. 2. Chronic bilateral lower extremity edema. 3. Hyponatremia and hypotension, secondary to hypovolemia. 4. Acute renal failure secondary to prerenal azotemia and possibly urinary retention. 5. Metabolic acidosis secondary to acute renal failure. Resolved. 6. Chronic atrial fibrillation with rapid ventricular response. 7. Type 2 diabetes mellitus, well controlled. Hemoglobin A1c was 8.0. 8. Morbid obesity. 9. Urinary retention. Resolved. Cystoscopy performed by Dr. Jerre Simon on 10/18/2011 was unremarkable per verbal report. 10. Pulmonary artery hypertension, per 2-D echocardiogram on 09/17/2011. The echo was very limited.    Medication List  As of 10/18/2011  5:36 PM   STOP taking these medications         cephALEXin 500 MG capsule      quinapril-hydrochlorothiazide 20-12.5 MG per tablet      silver sulfADIAZINE 1 % cream      UNNA-FLEX ELASTIC UNNA BOOT EX         TAKE these medications         amoxicillin 500 MG capsule   Commonly known as: AMOXIL   Take 1 capsule (500 mg total) by mouth 2 (two) times daily. Antibiotic to take for 7 more days.      aspirin 325 MG tablet   Take 81.25 mg by mouth daily. Takes one-quarter tablet (81.25 mg) daily      dabigatran 150 MG Caps   Commonly known as: PRADAXA   Take 150 mg by mouth every 12 (twelve) hours.      digoxin 0.125 MG tablet   Commonly known as: LANOXIN   Take 1 tablet (0.125 mg total) by mouth daily. Medication for your heart rate.      diltiazem 360 MG 24 hr capsule   Commonly known as: TIAZAC   Take 360 mg by mouth  daily.      Flax Seed Oil 1000 MG Caps   Take 1 capsule by mouth daily.      glipiZIDE 10 MG 24 hr tablet   Commonly known as: GLUCOTROL XL   Take 10 mg by mouth 2 (two) times daily.      oxyCODONE-acetaminophen 5-325 MG per tablet   Commonly known as: PERCOCET   Take 1 tablet by mouth every 4 (four) hours as needed for pain.      pioglitazone 45 MG tablet   Commonly known as: ACTOS   Take 45 mg by mouth daily.      polyethylene glycol packet   Commonly known as: MIRALAX / GLYCOLAX   Take 17 g by mouth daily. For constipation.      pravastatin 20 MG tablet   Commonly known as: PRAVACHOL   Take 20 mg by mouth daily.            Discharge Condition: Improved.  Disposition: 01-Home or Self Care   Consults: Argentina Donovan, M.D.   Significant Diagnostic Studies: US Venous Img Lower Unilateral Left  10/15/2011  *RADIOLOGY REPORT*  Clinical Data: Left leg pain and swelling, ulcerations, history of obesity and diabetes, evaluate for DVT.  LEFT LOWER EXTREMITY VENOUS DUPLEX ULTRASOUND  Technique:  Gray-scale sonography with graded compression, as well as color Doppler and duplex  ultrasound were performed to evaluate the deep venous system of the lower extremity from the level of the common femoral vein through the popliteal and proximal calf veins. Spectral Doppler was utilized to evaluate flow at rest and with distal augmentation maneuvers.  Comparison:  None.  Findings:  Examination is degraded secondary to patient body habitus.  Normal compressibility of the common femoral, superficial femoral, and popliteal veins is demonstrated, as well as the visualized proximal calf veins.  No filling defects to suggest DVT on grayscale or color Doppler imaging.  Doppler waveforms show normal direction of venous flow, normal respiratory phasicity and response to augmentation.  IMPRESSION: No evidence of deep vein thrombosis within the left lower extremity.  Original Report Authenticated By: Waynard Reeds, M.D.   US Arterial Seg Single  10/16/2011  *RADIOLOGY REPORT*  Clinical Data: Left sided posterior calf ulcer, bilateral lower extremity erythema, history of hypertension, hyperlipidemia, obesity, diabetes, prior history of smoking.  Bilateral lower extremity rest pain.  Evaluate for PAD.  ULTRASOUND ANKLE/BRACHIAL INDICES BILATERAL  Comparison: Left lower extremity venous Doppler ultrasound - 10/15/2011  Findings:  Right lower extremity - ABI - 1.28  Left lower extremity - A B I - 1.5  Wave forms are grossly symmetric bilaterally.  IMPRESSION: 1.  No evidence of vasoocclusive disease within either lower extremity. 2.  Mildly elevated ABIs bilaterally, left greater than right, most suggestive of small-vessel calcification likely secondary to reported history of diabetes.  Original Report Authenticated By: Waynard Reeds, M.D.   ECHO:Impressions:  - Severely limited study. No comment can be made about left ventricular size, function or wall motion. The inferior vena cava is dilated, consistent with high right atrial pressure. The left atrium is at least moderately dilated. The mitral and aortic valves, while very poorly seen, ae probably normal in structure. The Doppler data is not interpretable. There is mild tricuspid insufficiency. The systolic PA pressure is at least 46 mm Hg, but could be grossly underestimated. There is probably no meaningful pericardial effusion. Transthoracic echocardiography. M-mode, complete 2D, spectral Doppler, and color Doppler. Height: Height: 185.4cm. Height: 73in. Weight: Weight: 168.7kg. Weight: 371.2lb. Body mass index: BMI: 49.1kg/m^2. Body surface area: BSA: 2.25m^2. Patient status: Inpatient. Location: Bedside.     Microbiology: Recent Results (from the past 240 hour(s))  CULTURE, BLOOD (ROUTINE X 2)     Status: Normal (Preliminary result)   Collection Time   10/14/11  1:55 PM      Component Value Range Status Comment   Specimen  Description BLOOD LEFT HAND   Final    Special Requests BOTTLES DRAWN AEROBIC ONLY 4CC   Final    Culture NO GROWTH 4 DAYS   Final    Report Status PENDING   Incomplete   CULTURE, BLOOD (ROUTINE X 2)     Status: Normal (Preliminary result)   Collection Time   10/14/11  2:00 PM      Component Value Range Status Comment   Specimen Description BLOOD LEFT HAND   Final    Special Requests BOTTLES DRAWN AEROBIC ONLY 4CC   Final    Culture NO GROWTH 4 DAYS   Final    Report Status PENDING   Incomplete   URINE CULTURE     Status: Normal   Collection Time   10/15/11  6:56 AM      Component Value Range Status Comment   Specimen Description URINE, CLEAN CATCH   Final    Special Requests NONE  Final    Culture  Setup Time 10/15/2011 19:37   Final    Colony Count NO GROWTH   Final    Culture NO GROWTH   Final    Report Status 10/17/2011 FINAL   Final   SURGICAL PCR SCREEN     Status: Normal   Collection Time   10/18/11 12:01 PM      Component Value Range Status Comment   MRSA, PCR NEGATIVE  NEGATIVE Final    Staphylococcus aureus NEGATIVE  NEGATIVE Final      Labs: Results for orders placed during the hospital encounter of 10/14/11 (from the past 48 hour(s))  GLUCOSE, CAPILLARY     Status: Abnormal   Collection Time   10/16/11  9:02 PM      Component Value Range Comment   Glucose-Capillary 170 (*) 70 - 99 mg/dL    Comment 1 Notify RN     BASIC METABOLIC PANEL     Status: Abnormal   Collection Time   10/17/11  6:06 AM      Component Value Range Comment   Sodium 138  135 - 145 mEq/L    Potassium 4.7  3.5 - 5.1 mEq/L    Chloride 108  96 - 112 mEq/L    CO2 24  19 - 32 mEq/L    Glucose, Bld 141 (*) 70 - 99 mg/dL    BUN 22  6 - 23 mg/dL DELTA CHECK NOTED   Creatinine, Ser 0.83  0.50 - 1.35 mg/dL    Calcium 9.1  8.4 - 16.1 mg/dL    GFR calc non Af Amer 90 (*) >90 mL/min    GFR calc Af Amer >90  >90 mL/min   GLUCOSE, CAPILLARY     Status: Abnormal   Collection Time   10/17/11  7:33 AM       Component Value Range Comment   Glucose-Capillary 127 (*) 70 - 99 mg/dL    Comment 1 Notify RN     GLUCOSE, CAPILLARY     Status: Abnormal   Collection Time   10/17/11 11:11 AM      Component Value Range Comment   Glucose-Capillary 172 (*) 70 - 99 mg/dL    Comment 1 Notify RN     GLUCOSE, CAPILLARY     Status: Abnormal   Collection Time   10/17/11  4:53 PM      Component Value Range Comment   Glucose-Capillary 144 (*) 70 - 99 mg/dL    Comment 1 Notify RN      Comment 2 Documented in Chart     GLUCOSE, CAPILLARY     Status: Abnormal   Collection Time   10/17/11  8:32 PM      Component Value Range Comment   Glucose-Capillary 172 (*) 70 - 99 mg/dL    Comment 1 Notify RN      Comment 2 Documented in Chart     BASIC METABOLIC PANEL     Status: Abnormal   Collection Time   10/18/11  6:19 AM      Component Value Range Comment   Sodium 136  135 - 145 mEq/L    Potassium 4.6  3.5 - 5.1 mEq/L    Chloride 105  96 - 112 mEq/L    CO2 25  19 - 32 mEq/L    Glucose, Bld 143 (*) 70 - 99 mg/dL    BUN 15  6 - 23 mg/dL    Creatinine, Ser 0.96  0.50 - 1.35  mg/dL    Calcium 9.4  8.4 - 16.1 mg/dL    GFR calc non Af Amer >90  >90 mL/min    GFR calc Af Amer >90  >90 mL/min   GLUCOSE, CAPILLARY     Status: Abnormal   Collection Time   10/18/11  7:39 AM      Component Value Range Comment   Glucose-Capillary 135 (*) 70 - 99 mg/dL   GLUCOSE, CAPILLARY     Status: Abnormal   Collection Time   10/18/11 11:35 AM      Component Value Range Comment   Glucose-Capillary 131 (*) 70 - 99 mg/dL   SURGICAL PCR SCREEN     Status: Normal   Collection Time   10/18/11 12:01 PM      Component Value Range Comment   MRSA, PCR NEGATIVE  NEGATIVE    Staphylococcus aureus NEGATIVE  NEGATIVE   GLUCOSE, CAPILLARY     Status: Abnormal   Collection Time   10/18/11  4:46 PM      Component Value Range Comment   Glucose-Capillary 228 (*) 70 - 99 mg/dL    Comment 1 Notify RN      Comment 2 Documented in Chart        HPI :  The patient is a 66 year old man with a history significant for 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 He presented to the emergency department on 10/14/2011 with a chief complaint of worsening left leg ulcerations, left leg weeping drainage, and pain. In the emergency department, he was initially hypotensive with a blood pressure of 79/50. Following a liter of IV fluids, his blood pressure improved to 95/53. He was afebrile. His lab data were significant for a serum sodium of 128, potassium of 5.1, CO2 of 17, BUN of 47, creatinine of 2.35, glucose of 145, and WBC of 13.2. His EKG revealed atrial fibrillation with a heart rate of 105 beats per minute. He was admitted for further evaluation and management. For additional details please see the dictated history and physical.  HOSPITAL COURSE: The patient was continued on IV fluids for hydration. Given his lab data and clinical appearance, it appeared that he was volume depleted. His hyponatremia, hypotension, and acute renal failure, were all thought to be secondary to hypovolemia and not sepsis. Although his CO2 was decreased, his lactic acid level was within normal limits, and therefore, the metabolic acidosis is likely secondary to acute renal failure. Blood cultures were ordered. He was started empirically on vancomycin for treatment of left lower extremity cellulitis and acute on chronic venous stasis ulcers. Diltiazem and quinapril/HCTZ were withheld due to to his low normal blood pressures. Eventually, his heart rate became slightly uncontrolled in the setting of chronic paroxysmal atrial fibrillation and off of diltiazem. Once his blood pressure improved, diltiazem was restarted at a slightly lower dose and digoxin was added. He remained anticoagulated with Pradaxa. A 2-D echocardiogram was ordered for evaluation. The exam was very limited technically, but appear to have shown  mild pulmonary hypertension. Actos and glipizide were withheld during the hospitalization. His diabetes was treated with sliding scale NovoLog. Although his hemoglobin A1c was 8.0, his glycemic control during the hospitalization was excellent.  The wound care nurse was consulted. Following her evaluation, she recommended Profore dressing changes which appear to have been more effective than the Unna boots he had been treating the chronic ulcers with at home. The dressings were changed  several times during the hospital course, but eventually, the wound care nurse recommended twice weekly dressing changes. The physical therapist was consulted to provide dressing changes as recommended. Toward the end of the hospitalization, the extent of erythema and edema of the left leg subsided. The ulcerations were cleaner but still had some serous weeping. There was no purulent drainage. Occasionally there was some malodor. Home health registered nursing was ordered for wound care monitoring and dressing changes.  For further evaluation of chronic lower extremity edema and left lower extremity ulcerations, a venous duplex ultrasound of his left leg was ordered to rule out DVT. It was negative for DVT. Also, arterial ultrasound was ordered to evaluate for peripheral vascular disease. The arterial ultrasound revealed good blood flow and no evidence of vaso-occlusive disease.  The patient complained of difficulty urinating. Flomax was started. A Foley catheter was placed and it drained 650 cc of urine. A urinalysis was ordered. It was essentially negative for infection. Dr. Jerre Simon was consulted. Following his initial evaluation, he recommended cystoscopy. The cystoscopy was performed today. The official report is unavailable, however, it was reported that the cystoscopy was normal. The Foley catheter was discontinued. The patient was able to spontaneously urinate on 2 different occasions. The patient also was noted to be  constipated, and perhaps, constipation caused some urinary retention. He was given a laxative and subsequently had a large bowel movement.  The patient's blood cultures remained negative. MRSA by PCR was negative. Staphylococcus aureus screen was negative. His renal function normalized. At the time of hospital discharge, his creatinine was 0.77. His serum sodium also normalized. His blood pressure improved enough that full dose diltiazem was restarted. His heart rate improved. However, he was discharged on digoxin, which is new for him. He was instructed to continue to hold quinapril and HCTZ until he was reevaluated by his primary care physician next week. He received intravenous vancomycin for 4 days during hospital course. He was discharged on 7 more days of amoxicillin empirically. He was discharged home in improved and stable condition.      Discharge Exam: Blood pressure 111/68, pulse 102, temperature 98.3 F (36.8 C), temperature source Oral, resp. rate 20, height 6\' 1"  (1.854 m), weight 170.371 kg (375 lb 9.6 oz), SpO2 94.00%.  Lungs: Clear anteriorly with decreased breath sounds in the bases. Heart: Irregular, irregular, with borderline tachycardia. Abdomen: Morbidly obese, positive bowel sounds, soft, nontender, nondistended. Extremities: Trace of pedal edema on the right. Left lower extremity with significantly less erythema and edema. Ulcerations have a clean base. Small amount of slightly malodorous drainage, nonpurulent.    Discharge Orders    Future Orders Please Complete By Expires   Diet - low sodium heart healthy      Increase activity slowly      Discharge wound care:      Comments:   Profore compression dressings to be changed twice weekly until the edema is under better control. Place noncontact layer over the denuded areas on the left leg followed by placement of a 4 layer compression product (Profore) for maximum absorbency. Initial change frequency will be twice  weekly on every Monday and Thursday.   Discharge instructions      Comments:   Do not take quinapril/HCTZ until you followup with your primary care physician next week. Your blood pressure will need to be checked again.      Follow-up Information    Follow up with [facility]. (Your appointment is on July 18  at  11:40am)       Follow up with [facility]. (Wound Care two time a week [phone])          Discharge time: Greater than 35 minutes.   Signed: Shaindy Reader 10/18/2011, 5:36 PM

## 2011-10-18 NOTE — Evaluation (Signed)
Physical Therapy Evaluation Patient Details Name: Jonathan Wise MRN: [id number] DOB: [birth year] Today's Date: 10/18/2011 Time: 4098-1191 PT Time Calculation (min): 76 min  PT Assessment / Plan / Recommendation Clinical Impression  Pt is close to functional baseline and has adequate DME at home.  His primary problem continues to be the wounds on his LLE.  The drainage head seeped through the Coban layer and is very foul smelling .  The bandage was carefully removed, sterile water used to prevant tearing of the tissue.  The wounds on the medial border of his calf are now healed but the lateral wounds remail unchanged.  The drainage is thick, yellow and copious in amount.  I am concerned about the odor present and notified RN and have called the WOC nurse to notify.  she has not called me back.  The wounds were gently cleansed with sterile saline and Profore was applied.  The wrapping keeps him free of pain.   I am going to ask nursing to continue to ambulate him in the hallway to maintain mobility.             PT Assessment  Patient needs continued PT services    Follow Up Recommendations       Barriers to Discharge        Equipment Recommendations       Recommendations for Other Services     Frequency      Precautions / Restrictions Precautions Precautions: None Restrictions Weight Bearing Restrictions: No   Pertinent Vitals/Pain       Mobility  Bed Mobility Bed Mobility: Supine to Sit;Sit to Supine Supine to Sit: 4: Min assist Sit to Supine: 4: Min assist Details for Bed Mobility Assistance: pt needs some assist to move LLE in bed...at home, wife helps him Ambulation/Gait Ambulation/Gait Assistance: 6: Modified independent (Device/Increase time) Ambulation Distance (Feet): 100 Feet Assistive device: Rolling walker Gait Pattern: Within Functional Limits Stairs: No Wheelchair Mobility Wheelchair Mobility: No    Exercises     PT Diagnosis:    PT Problem List:   PT  Treatment Interventions:     PT Goals Acute Rehab PT Goals PT Goal Formulation: With patient  Visit Information  Last PT Received On: 10/18/11    Subjective Data  Subjective: no c/o Patient Stated Goal: wnats LLE healed   Prior Functioning  Home Living Lives With: Spouse Available Help at Discharge: Family Type of Home: House Home Access: Level entry Home Layout: One level Home Adaptive Equipment: Environmental consultant - rolling;Straight cane Prior Function Level of Independence: Independent with assistive device(s) Able to Take Stairs?: Yes Driving: Yes Vocation: Retired Musician: No difficulties    Cognition  Overall Cognitive Status: Appears within functional limits for tasks assessed/performed Arousal/Alertness: Awake/alert Orientation Level: Appears intact for tasks assessed Behavior During Session: [facility] for tasks performed    Extremity/Trunk Assessment Right Upper Extremity Assessment RUE ROM/Strength/Tone: [facility] for tasks assessed Left Upper Extremity Assessment LUE ROM/Strength/Tone: WFL for tasks assessed Right Lower Extremity Assessment RLE ROM/Strength/Tone: [facility] for tasks assessed Left Lower Extremity Assessment LLE ROM/Strength/Tone: Deficits LLE ROM/Strength/Tone Deficits: strength 3-/5 due to wounds, obseity   Balance Balance Balance Assessed: No  End of Session PT - End of Session Equipment Utilized During Treatment: Gait belt Activity Tolerance: Patient tolerated treatment well Patient left: in chair;with call bell/phone within reach Nurse Communication: Mobility status  GP     Konrad Penta 10/18/2011, 10:41 AM

## 2011-10-18 NOTE — OR Nursing (Signed)
Pre-op VS at 1440 BP 144/62, P 100, resp 20, O2% 96%; VS @ 1445 intraop BP 121/85, P 101, Resp 23, Pulse ox 97%; Post op VS @ 1447 BP 137/71, P 96, Resp 27, Pulse ox 97%;

## 2011-10-18 NOTE — Progress Notes (Signed)
SCDs were not able to be applied due to condition of patient's legs.

## 2011-10-19 LAB — CULTURE, BLOOD (ROUTINE X 2): Culture: NO GROWTH

## 2011-10-19 NOTE — Consult Note (Signed)
NAME:  Jonathan Wise, STOCKS NO.:  [id number]  MEDICAL RECORD NO.:  [id number]  LOCATION:  A328                          FACILITY:  [facility]  PHYSICIAN:  Ky Barban, M.D.DATE OF BIRTH:  [birth year]  DATE OF CONSULTATION: DATE OF DISCHARGE:  10/18/2011                                CONSULTATION   CHIEF COMPLAINT:  Acute urinary retention.  HISTORY:  66 year old gentleman he was not having any symptoms of prostatism, until he came to the hospital where he was admitted primarily, he has lot of problems with venous stasis of his left leg, history of remote left leg DVT, diabetes mellitus, atrial fibrillation, anticoagulation with Pradaxa.  His chief complaint of this admission was worsening left leg weeping, redness and pain.  He is being treated with Unna boots, dressing, and antibiotics.  When the symptoms became worse he came to the emergency room where he is admitted for further management of this left leg ulceration and during the hospitalization, he went into urinary retention although he was not having any significant symptoms of prostatism, so Foley catheter was inserted. When I asked him, he denies any history of having prostatism.  However, he says he started to have difficulty voiding and dribbling the day he is admitted in the hospital.  He has multiple other medical problems which include hypertension, morbid obesity, atrial fibrillation, and hyperlipidemia.  He has history of having kidney stones.  PHYSICAL EXAMINATION:  GENERAL:  Moderately obese male, not in acute distress, fully conscious, alert, oriented. ABDOMEN:  Soft, flat.  Liver, spleen, and kidneys are not palpable. GENITOURINARY:  External genitalia has Foley catheter in place. RECTAL:  Normal sphincter tone.  Prostate 1-1/5 plus smooth and firm.  LAB DATA:  Urinalysis shows grossly it is hazy and rest of the urinalysis is negative.  His sodium is 136, potassium 4.6, chloride 105, CO2 is  25, BUN is 15, creatinine 0.77.  His WBC count on October 15, 2011, is 10.7, hematocrit 36.3.  IMPRESSION:  Benign prostatic hypertrophy and acute urinary retention. Recommend cystoscopy under local anesthesia.     Ky Barban, M.D.    MIJ/MEDQ  D:  10/18/2011  T:  10/19/2011  Job:  [id number]

## 2011-10-19 NOTE — Progress Notes (Signed)
Discharge instructions and prescriptions given, verbalized understanding, out in stable condition via w/c with staff. 

## 2011-10-19 NOTE — Op Note (Signed)
NAME:  Jonathan Wise, Jonathan Wise NO.:  [id number]  MEDICAL RECORD NO.:  [id number]  LOCATION:  A328                          FACILITY:  [facility]  PHYSICIAN:  Ky Barban, M.D.DATE OF BIRTH:  [birth year]  DATE OF PROCEDURE: DATE OF DISCHARGE:  10/18/2011                              OPERATIVE REPORT   PREOPERATIVE DIAGNOSES:  Benign prostatic hypertrophy, acute urinary retention.  POSTOPERATIVE DIAGNOSES:  Benign prostatic hypertrophy, no bladder neck obstruction.  PROCEDURE:  Cystoscopy.  DESCRIPTION OF PROCEDURE:  The patient placed in the supine position. After usual prep and drape, flexible cystoscope introduced into the bladder under direct vision.  Anterior urethra looks normal.  Prostatic urethra shows no lateral lobe hypertrophy.  Bladder neck is open. Bladder is smooth.  No tumor, stone, foreign body, or inflammation. Cystoscope was removed.  The patient left the procedure room in satisfactory condition.     Ky Barban, M.D.     MIJ/MEDQ  D:  10/18/2011  T:  10/19/2011  Job:  [id number]

## 2011-10-20 ENCOUNTER — Encounter (HOSPITAL_COMMUNITY): Payer: Self-pay | Admitting: Urology

## 2011-10-28 ENCOUNTER — Other Ambulatory Visit: Payer: Self-pay | Admitting: Internal Medicine

## 2011-11-07 ENCOUNTER — Encounter (HOSPITAL_BASED_OUTPATIENT_CLINIC_OR_DEPARTMENT_OTHER): Payer: Medicare Other | Attending: General Surgery

## 2011-11-07 DIAGNOSIS — L0291 Cutaneous abscess, unspecified: Secondary | ICD-10-CM | POA: Insufficient documentation

## 2011-11-07 DIAGNOSIS — E7800 Pure hypercholesterolemia, unspecified: Secondary | ICD-10-CM | POA: Insufficient documentation

## 2011-11-07 DIAGNOSIS — Z7982 Long term (current) use of aspirin: Secondary | ICD-10-CM | POA: Insufficient documentation

## 2011-11-07 DIAGNOSIS — I872 Venous insufficiency (chronic) (peripheral): Secondary | ICD-10-CM | POA: Insufficient documentation

## 2011-11-07 DIAGNOSIS — I83009 Varicose veins of unspecified lower extremity with ulcer of unspecified site: Secondary | ICD-10-CM | POA: Insufficient documentation

## 2011-11-07 DIAGNOSIS — Z79899 Other long term (current) drug therapy: Secondary | ICD-10-CM | POA: Insufficient documentation

## 2011-11-07 DIAGNOSIS — E119 Type 2 diabetes mellitus without complications: Secondary | ICD-10-CM | POA: Insufficient documentation

## 2011-11-07 DIAGNOSIS — I10 Essential (primary) hypertension: Secondary | ICD-10-CM | POA: Insufficient documentation

## 2011-11-07 NOTE — H&P (Signed)
NAME:  Jonathan Wise, Jonathan Wise NO.:  [id number]  MEDICAL RECORD NO.:  [id number]  LOCATION:  FOOT                         FACILITY:  [facility]  PHYSICIAN:  Joanne Gavel, M.D.        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  HISTORY OF PRESENT ILLNESS:  This is a 66 year old male with a long history of varicose venous insufficiency.  He was treated more than 20 years ago with a thrombophlebitis of the right leg.  The present wounds have been present for approximately 2 months.  He has been self medicating by applying Unna boots and Profore Lite himself.  He was hospitalized recently with medical problem of unknown cause.  He is not sure why he was hospitalized.  He was in urinary retention at that time and after cystoscopy, catheter removed.  He is having no trouble voiding at present.  PAST MEDICAL HISTORY:  Quite extensive and includes chronic atrial fibrillation, treatment of cellulitis recently, diabetes mellitus, hypercholesterolemia, hypertension.  PAST SURGICAL HISTORY:  Hernia repair, knee surgery, and removal of kidney stone.  Cigarettes none since 2005.  Alcohol occasionally.  MEDICATIONS:  Amoxicillin, aspirin, dabigatran 150 b.i.d., digoxin, diltiazem, glipizide, pioglitazone, and pravastatin.  ALLERGY:  FENOFIBRATE causes dizziness.  REVIEW OF SYSTEMS:  The patient's main problem lately has been severe drainage from the leg requiring multiple changes.  He is being seen by a home health care 3 times a week.  PHYSICAL EXAMINATION:  VITAL SIGNS:  Temperature of 98.1, pulse 100 and slightly irregular, glucose was 98.1, blood pressure 122/75. GENERAL APPEARANCE:  Well developed, obese, in no distress. HEENT:  Cranium normocephalic. NECK:  Supple. CHEST:  Clear. HEART:  Slightly irregular. ABDOMEN:  Not  examined. EXTREMITIES:  Examination of extremities reveals severe stasis changes. There are numerous ulcers covered with thick slough in the left lower extremity.  The total area of ulceration is 15.4 x 28 cm.  The surrounding skin is extremely red and blanches.  There is no tenderness however.  Peripheral pulses are only weakly palpable, although ABI was measured at 0.96 on the left.  IMPRESSION:  Severe varicose insufficiency.  He has recently been treated and is now on treatment for cellulitis.  PLAN OF TREATMENT:  Silver alginate and Unna boots.  I have urged elevation at every opportunity.  We have taken a culture.  I have also urged him to take his temperature at least 4 times a day as he may be having some systemic symptoms of cellulitis and might need intravenous antibiotics.     Joanne Gavel, M.D.     RA/MEDQ  D:  11/07/2011  T:  11/07/2011  Job:  [id number]  cc:   Marjory Lies, M.D.

## 2011-11-14 ENCOUNTER — Encounter (HOSPITAL_BASED_OUTPATIENT_CLINIC_OR_DEPARTMENT_OTHER): Payer: Medicare Other | Attending: General Surgery

## 2011-11-14 DIAGNOSIS — I872 Venous insufficiency (chronic) (peripheral): Secondary | ICD-10-CM | POA: Insufficient documentation

## 2011-11-14 DIAGNOSIS — L97809 Non-pressure chronic ulcer of other part of unspecified lower leg with unspecified severity: Secondary | ICD-10-CM | POA: Insufficient documentation

## 2011-11-14 DIAGNOSIS — L02419 Cutaneous abscess of limb, unspecified: Secondary | ICD-10-CM | POA: Insufficient documentation

## 2011-12-05 ENCOUNTER — Encounter (HOSPITAL_BASED_OUTPATIENT_CLINIC_OR_DEPARTMENT_OTHER): Payer: Medicare Other

## 2011-12-12 ENCOUNTER — Encounter (HOSPITAL_BASED_OUTPATIENT_CLINIC_OR_DEPARTMENT_OTHER): Payer: Medicare Other | Attending: General Surgery

## 2011-12-12 DIAGNOSIS — L97809 Non-pressure chronic ulcer of other part of unspecified lower leg with unspecified severity: Secondary | ICD-10-CM | POA: Insufficient documentation

## 2011-12-12 DIAGNOSIS — I872 Venous insufficiency (chronic) (peripheral): Secondary | ICD-10-CM | POA: Insufficient documentation

## 2012-01-09 ENCOUNTER — Encounter (HOSPITAL_BASED_OUTPATIENT_CLINIC_OR_DEPARTMENT_OTHER): Payer: Medicare Other | Attending: General Surgery

## 2012-01-09 DIAGNOSIS — I872 Venous insufficiency (chronic) (peripheral): Secondary | ICD-10-CM | POA: Insufficient documentation

## 2012-01-09 DIAGNOSIS — I890 Lymphedema, not elsewhere classified: Secondary | ICD-10-CM | POA: Insufficient documentation

## 2012-01-09 DIAGNOSIS — L97809 Non-pressure chronic ulcer of other part of unspecified lower leg with unspecified severity: Secondary | ICD-10-CM | POA: Insufficient documentation

## 2012-02-13 ENCOUNTER — Encounter (HOSPITAL_BASED_OUTPATIENT_CLINIC_OR_DEPARTMENT_OTHER): Payer: Medicare Other | Attending: General Surgery

## 2012-02-13 DIAGNOSIS — L97909 Non-pressure chronic ulcer of unspecified part of unspecified lower leg with unspecified severity: Secondary | ICD-10-CM | POA: Insufficient documentation

## 2012-02-13 DIAGNOSIS — I87319 Chronic venous hypertension (idiopathic) with ulcer of unspecified lower extremity: Secondary | ICD-10-CM | POA: Insufficient documentation

## 2012-02-14 ENCOUNTER — Encounter (HOSPITAL_BASED_OUTPATIENT_CLINIC_OR_DEPARTMENT_OTHER): Payer: Medicare Other

## 2012-03-12 ENCOUNTER — Encounter (HOSPITAL_BASED_OUTPATIENT_CLINIC_OR_DEPARTMENT_OTHER): Payer: Medicare Other | Attending: General Surgery

## 2012-04-16 ENCOUNTER — Encounter (HOSPITAL_BASED_OUTPATIENT_CLINIC_OR_DEPARTMENT_OTHER): Payer: Medicare Other

## 2012-06-22 ENCOUNTER — Emergency Department (HOSPITAL_COMMUNITY): Payer: Medicare Other

## 2012-06-22 ENCOUNTER — Observation Stay (HOSPITAL_COMMUNITY): Payer: Medicare Other

## 2012-06-22 ENCOUNTER — Encounter (HOSPITAL_COMMUNITY): Payer: Self-pay

## 2012-06-22 ENCOUNTER — Inpatient Hospital Stay (HOSPITAL_COMMUNITY)
Admission: EM | Admit: 2012-06-22 | Discharge: 2012-06-24 | DRG: 872 | Disposition: A | Discharge status: Home / Self Care | Payer: Medicare Other | Attending: Internal Medicine | Admitting: Internal Medicine

## 2012-06-22 DIAGNOSIS — I4891 Unspecified atrial fibrillation: Secondary | ICD-10-CM | POA: Diagnosis present

## 2012-06-22 DIAGNOSIS — Z86718 Personal history of other venous thrombosis and embolism: Secondary | ICD-10-CM

## 2012-06-22 DIAGNOSIS — N179 Acute kidney failure, unspecified: Secondary | ICD-10-CM

## 2012-06-22 DIAGNOSIS — R55 Syncope and collapse: Secondary | ICD-10-CM | POA: Diagnosis present

## 2012-06-22 DIAGNOSIS — E785 Hyperlipidemia, unspecified: Secondary | ICD-10-CM | POA: Diagnosis present

## 2012-06-22 DIAGNOSIS — Z7901 Long term (current) use of anticoagulants: Secondary | ICD-10-CM

## 2012-06-22 DIAGNOSIS — R6510 Systemic inflammatory response syndrome (SIRS) of non-infectious origin without acute organ dysfunction: Secondary | ICD-10-CM

## 2012-06-22 DIAGNOSIS — R824 Acetonuria: Secondary | ICD-10-CM | POA: Diagnosis present

## 2012-06-22 DIAGNOSIS — I872 Venous insufficiency (chronic) (peripheral): Secondary | ICD-10-CM | POA: Diagnosis present

## 2012-06-22 DIAGNOSIS — I959 Hypotension, unspecified: Secondary | ICD-10-CM

## 2012-06-22 DIAGNOSIS — Z79899 Other long term (current) drug therapy: Secondary | ICD-10-CM

## 2012-06-22 DIAGNOSIS — Z9181 History of falling: Secondary | ICD-10-CM

## 2012-06-22 DIAGNOSIS — Z7982 Long term (current) use of aspirin: Secondary | ICD-10-CM

## 2012-06-22 DIAGNOSIS — M1710 Unilateral primary osteoarthritis, unspecified knee: Secondary | ICD-10-CM | POA: Diagnosis present

## 2012-06-22 DIAGNOSIS — A419 Sepsis, unspecified organism: Principal | ICD-10-CM | POA: Diagnosis present

## 2012-06-22 DIAGNOSIS — R339 Retention of urine, unspecified: Secondary | ICD-10-CM

## 2012-06-22 DIAGNOSIS — M1711 Unilateral primary osteoarthritis, right knee: Secondary | ICD-10-CM | POA: Diagnosis present

## 2012-06-22 DIAGNOSIS — R110 Nausea: Secondary | ICD-10-CM

## 2012-06-22 DIAGNOSIS — Z66 Do not resuscitate: Secondary | ICD-10-CM | POA: Diagnosis present

## 2012-06-22 DIAGNOSIS — R739 Hyperglycemia, unspecified: Secondary | ICD-10-CM | POA: Diagnosis present

## 2012-06-22 DIAGNOSIS — I493 Ventricular premature depolarization: Secondary | ICD-10-CM | POA: Diagnosis present

## 2012-06-22 DIAGNOSIS — E861 Hypovolemia: Secondary | ICD-10-CM

## 2012-06-22 DIAGNOSIS — I10 Essential (primary) hypertension: Secondary | ICD-10-CM | POA: Diagnosis present

## 2012-06-22 DIAGNOSIS — L03116 Cellulitis of left lower limb: Secondary | ICD-10-CM | POA: Diagnosis present

## 2012-06-22 DIAGNOSIS — L03119 Cellulitis of unspecified part of limb: Secondary | ICD-10-CM | POA: Diagnosis present

## 2012-06-22 DIAGNOSIS — E119 Type 2 diabetes mellitus without complications: Secondary | ICD-10-CM | POA: Diagnosis present

## 2012-06-22 DIAGNOSIS — Z6841 Body Mass Index (BMI) 40.0 and over, adult: Secondary | ICD-10-CM

## 2012-06-22 DIAGNOSIS — E1165 Type 2 diabetes mellitus with hyperglycemia: Secondary | ICD-10-CM | POA: Diagnosis present

## 2012-06-22 DIAGNOSIS — L02419 Cutaneous abscess of limb, unspecified: Secondary | ICD-10-CM | POA: Diagnosis present

## 2012-06-22 DIAGNOSIS — R7309 Other abnormal glucose: Secondary | ICD-10-CM

## 2012-06-22 DIAGNOSIS — I83009 Varicose veins of unspecified lower extremity with ulcer of unspecified site: Secondary | ICD-10-CM | POA: Diagnosis present

## 2012-06-22 DIAGNOSIS — IMO0001 Reserved for inherently not codable concepts without codable children: Secondary | ICD-10-CM | POA: Diagnosis present

## 2012-06-22 DIAGNOSIS — E871 Hypo-osmolality and hyponatremia: Secondary | ICD-10-CM | POA: Diagnosis present

## 2012-06-22 DIAGNOSIS — Z87442 Personal history of urinary calculi: Secondary | ICD-10-CM

## 2012-06-22 DIAGNOSIS — R600 Localized edema: Secondary | ICD-10-CM

## 2012-06-22 LAB — COMPREHENSIVE METABOLIC PANEL
AST: 28 U/L (ref 0–37)
BUN: 11 mg/dL (ref 6–23)
CO2: 24 mEq/L (ref 19–32)
Calcium: 9.3 mg/dL (ref 8.4–10.5)
Chloride: 96 mEq/L (ref 96–112)
Creatinine, Ser: 0.74 mg/dL (ref 0.50–1.35)
GFR calc Af Amer: >90 mL/min (ref >90)
GFR calc non Af Amer: >90 mL/min (ref >90)
Glucose, Bld: 392 mg/dL — ABNORMAL HIGH (ref 70–99)
Total Bilirubin: 1.4 mg/dL — ABNORMAL HIGH (ref 0.3–1.2)

## 2012-06-22 LAB — CBC WITH DIFFERENTIAL/PLATELET
Eosinophils Absolute: 0 10*3/uL (ref 0.0–0.7)
Eosinophils Relative: 0 % (ref 0–5)
HCT: 42.5 % (ref 39.0–52.0)
Lymphocytes Relative: 2 % — ABNORMAL LOW (ref 12–46)
Lymphs Abs: 0.4 10*3/uL — ABNORMAL LOW (ref 0.7–4.0)
MCH: 28.4 pg (ref 26.0–34.0)
MCV: 88 fL (ref 78.0–100.0)
Monocytes Absolute: 1 10*3/uL (ref 0.1–1.0)
Monocytes Relative: 4 % (ref 3–12)
Platelets: 271 10*3/uL (ref 150–400)
RBC: 4.83 MIL/uL (ref 4.22–5.81)
WBC: 28.8 10*3/uL — ABNORMAL HIGH (ref 4.0–10.5)

## 2012-06-22 LAB — URINALYSIS, ROUTINE W REFLEX MICROSCOPIC
Bilirubin Urine: NEGATIVE
Glucose, UA: >1000 mg/dL — AB
Specific Gravity, Urine: 1.01 (ref 1.005–1.030)
pH: 6 (ref 5.0–8.0)

## 2012-06-22 LAB — BLOOD GAS, VENOUS
Bicarbonate: 24.6 mEq/L — ABNORMAL HIGH (ref 20.0–24.0)
TCO2: 21.4 mmol/L (ref 0–100)
pCO2, Ven: 35.1 mmHg — ABNORMAL LOW (ref 45.0–50.0)
pH, Ven: 7.458 — ABNORMAL HIGH (ref 7.250–7.300)

## 2012-06-22 LAB — GLUCOSE, CAPILLARY
Glucose-Capillary: 388 mg/dL — ABNORMAL HIGH (ref 70–99)
Glucose-Capillary: 348 mg/dL — ABNORMAL HIGH (ref 70–99)
Glucose-Capillary: 345 mg/dL — ABNORMAL HIGH (ref 70–99)
Glucose-Capillary: 246 mg/dL — ABNORMAL HIGH (ref 70–99)

## 2012-06-22 LAB — HEPATIC FUNCTION PANEL
AST: 28 U/L (ref 0–37)
Bilirubin, Direct: 0.3 mg/dL (ref 0.0–0.3)

## 2012-06-22 LAB — URINE MICROSCOPIC-ADD ON

## 2012-06-22 MED ORDER — SODIUM CHLORIDE 0.9 % IV SOLN
1500.0000 mg | Freq: Once | INTRAVENOUS | Status: AC
  Administered 2012-06-22: 1500 mg via INTRAVENOUS
  Filled 2012-06-22: qty 1500

## 2012-06-22 MED ORDER — DABIGATRAN ETEXILATE MESYLATE 150 MG PO CAPS
ORAL_CAPSULE | ORAL | Status: AC
  Filled 2012-06-22: qty 1

## 2012-06-22 MED ORDER — GLIPIZIDE ER 10 MG PO TB24
10.0000 mg | ORAL_TABLET | Freq: Two times a day (BID) | ORAL | Status: DC
  Administered 2012-06-22 – 2012-06-24 (×4): 10 mg via ORAL
  Filled 2012-06-22 (×2): qty 2
  Filled 2012-06-22 (×2): qty 1

## 2012-06-22 MED ORDER — DABIGATRAN ETEXILATE MESYLATE 150 MG PO CAPS
150.0000 mg | ORAL_CAPSULE | Freq: Two times a day (BID) | ORAL | Status: DC
  Administered 2012-06-23 – 2012-06-24 (×4): 150 mg via ORAL
  Filled 2012-06-22 (×8): qty 1

## 2012-06-22 MED ORDER — SODIUM CHLORIDE 0.9 % IV SOLN
INTRAVENOUS | Status: DC
  Filled 2012-06-22 (×7): qty 1000

## 2012-06-22 MED ORDER — SODIUM CHLORIDE 0.9 % IV SOLN
INTRAVENOUS | Status: DC
  Administered 2012-06-22: 3.3 [IU]/h via INTRAVENOUS
  Administered 2012-06-22: 5.8 [IU]/h via INTRAVENOUS
  Filled 2012-06-22: qty 1

## 2012-06-22 MED ORDER — INSULIN ASPART 100 UNIT/ML ~~LOC~~ SOLN
0.0000 [IU] | Freq: Three times a day (TID) | SUBCUTANEOUS | Status: DC
  Administered 2012-06-23: 5 [IU] via SUBCUTANEOUS
  Administered 2012-06-23: 8 [IU] via SUBCUTANEOUS
  Administered 2012-06-23 – 2012-06-24 (×3): 5 [IU] via SUBCUTANEOUS

## 2012-06-22 MED ORDER — SODIUM CHLORIDE 0.9 % IV SOLN
INTRAVENOUS | Status: DC
  Administered 2012-06-22: 17:00:00 via INTRAVENOUS

## 2012-06-22 MED ORDER — PIPERACILLIN-TAZOBACTAM 3.375 G IVPB
3.3750 g | Freq: Three times a day (TID) | INTRAVENOUS | Status: DC
  Administered 2012-06-23 – 2012-06-24 (×5): 3.375 g via INTRAVENOUS
  Filled 2012-06-22 (×9): qty 50

## 2012-06-22 MED ORDER — POLYETHYLENE GLYCOL 3350 17 G PO PACK: 17.0000 g | PACK | Freq: Every day | ORAL | Status: DC | PRN

## 2012-06-22 MED ORDER — SODIUM CHLORIDE 0.9 % IV BOLUS (SEPSIS)
1000.0000 mL | Freq: Once | INTRAVENOUS | Status: AC
  Administered 2012-06-23: 1000 mL via INTRAVENOUS

## 2012-06-22 MED ORDER — ACETAMINOPHEN 325 MG PO TABS: 650.0000 mg | ORAL_TABLET | ORAL | Status: DC | PRN

## 2012-06-22 MED ORDER — MAGNESIUM CITRATE PO SOLN: 1.0000 | Freq: Once | ORAL | Status: AC | PRN

## 2012-06-22 MED ORDER — SODIUM CHLORIDE 0.9 % IV BOLUS (SEPSIS)
1000.0000 mL | Freq: Once | INTRAVENOUS | Status: AC
  Administered 2012-06-22: 1000 mL via INTRAVENOUS

## 2012-06-22 MED ORDER — TRAZODONE HCL 50 MG PO TABS: 50.0000 mg | ORAL_TABLET | Freq: Every evening | ORAL | Status: DC | PRN

## 2012-06-22 MED ORDER — ASPIRIN EC 81 MG PO TBEC
81.0000 mg | DELAYED_RELEASE_TABLET | Freq: Every day | ORAL | Status: DC
  Administered 2012-06-22 – 2012-06-24 (×3): 81 mg via ORAL
  Filled 2012-06-22 (×3): qty 1

## 2012-06-22 MED ORDER — DEXTROSE-NACL 5-0.45 % IV SOLN: INTRAVENOUS | Status: DC

## 2012-06-22 MED ORDER — RISAQUAD PO CAPS
2.0000 | ORAL_CAPSULE | Freq: Every day | ORAL | Status: DC
  Administered 2012-06-23 – 2012-06-24 (×3): 2 via ORAL
  Filled 2012-06-22 (×5): qty 2

## 2012-06-22 MED ORDER — INSULIN ASPART 100 UNIT/ML ~~LOC~~ SOLN
0.0000 [IU] | Freq: Every day | SUBCUTANEOUS | Status: DC
  Administered 2012-06-22: 2 [IU] via SUBCUTANEOUS
  Administered 2012-06-23: 3 [IU] via SUBCUTANEOUS

## 2012-06-22 MED ORDER — SORBITOL 70 % SOLN: 30.0000 mL | Freq: Every day | Status: DC | PRN

## 2012-06-22 MED ORDER — PIPERACILLIN-TAZOBACTAM 3.375 G IVPB
INTRAVENOUS | Status: AC
  Filled 2012-06-22: qty 100

## 2012-06-22 MED ORDER — METFORMIN HCL 500 MG PO TABS
500.0000 mg | ORAL_TABLET | Freq: Two times a day (BID) | ORAL | Status: DC
  Administered 2012-06-23 – 2012-06-24 (×3): 500 mg via ORAL
  Filled 2012-06-22 (×3): qty 1

## 2012-06-22 MED ORDER — INSULIN REGULAR HUMAN 100 UNIT/ML IJ SOLN
INTRAMUSCULAR | Status: AC
  Filled 2012-06-22: qty 3

## 2012-06-22 MED ORDER — ONDANSETRON HCL 4 MG/2ML IJ SOLN: 4.0000 mg | INTRAMUSCULAR | Status: DC | PRN

## 2012-06-22 MED ORDER — LINAGLIPTIN 5 MG PO TABS
5.0000 mg | ORAL_TABLET | Freq: Every day | ORAL | Status: DC
  Administered 2012-06-22 – 2012-06-24 (×3): 5 mg via ORAL
  Filled 2012-06-22 (×3): qty 1

## 2012-06-22 MED ORDER — DILTIAZEM HCL ER COATED BEADS 240 MG PO CP24
360.0000 mg | ORAL_CAPSULE | Freq: Every day | ORAL | Status: DC
  Administered 2012-06-22 – 2012-06-24 (×3): 360 mg via ORAL
  Filled 2012-06-22 (×7): qty 1

## 2012-06-22 MED ORDER — SIMVASTATIN 10 MG PO TABS
10.0000 mg | ORAL_TABLET | Freq: Every day | ORAL | Status: DC
  Administered 2012-06-23: 10 mg via ORAL
  Filled 2012-06-22: qty 1

## 2012-06-22 MED ORDER — DEXTROSE 50 % IV SOLN: 25.0000 mL | INTRAVENOUS | Status: DC | PRN

## 2012-06-22 MED ORDER — DIGOXIN 125 MCG PO TABS
0.1250 mg | ORAL_TABLET | Freq: Every day | ORAL | Status: DC
  Administered 2012-06-22 – 2012-06-24 (×3): 0.125 mg via ORAL
  Filled 2012-06-22 (×3): qty 1

## 2012-06-22 NOTE — H&P (Signed)
[facility] History and Physical  Jonathan Wise  WUJ:[id number]  DOB: July 14, 1945   DOA: 06/22/2012   PCP:   Pamelia Hoit, MD   Chief Complaint:  Weakness since this morning  HPI: Jonathan Wise is an 67 y.o. male.   Morbidly obese Caucasian gentleman diabetes and chronic venous stasis ulcers, started having cold chills and nausea at about 4:30 this morning. He did not notice any specific fever or any focal painful symptoms. He went back to bed the symptoms continued at about 10:30 he got up to use the bathroom. Used to walk up up to the bathroom door, then walked into the bathroom unaided but then fell and couldn't get back up. He called EMS and was brought to the emergency room for evaluation.  there is no history of head trauma  Patient notes that she's been having marked polyuria and polydipsia increased blurring of vision the past few days. Denies increase chest pains or shortness of denies cough denies urinary frequency. He has UNNA boots applied by his family members.   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  Rewiew of Systems:   All systems negative except as marked bold or noted in the HPI;  Constitutional:    malaise, fever and chills. ;  Eyes:   eye pain, redness and discharge. ;  ENMT:   ear pain, hoarseness, nasal congestion, sinus pressure and sore throat. ;  Cardiovascular:    chest pain, palpitations, diaphoresis, dyspnea and   Respiratory:    hemoptysis, wheezing and stridor. ;  Gastrointestinal:  nausea, vomiting, diarrhea, constipation, abdominal pain, melena, blood in stool, hematemesis, jaundice and rectal bleeding. unusual weight loss..   Genitourinary:     dysuria, incontinence,flank pain and hematuria; Musculoskeletal:   back pain and neck pain.  swelling and trauma.;  Skin: .  pruritus, rash, abrasions, bruising and skin lesion.;  ulcerations Neuro:    headache, lightheadedness and neck stiffness.  weakness, altered level of consciousness, altered mental status, extremity weakness, burning feet, involuntary movement, seizure and syncope.  Psych:    anxiety, depression, insomnia, tearfulness, panic attacks, hallucinations, paranoia, suicidal or homicidal ideation    Past Medical History  Diagnosis Date  . Type 2 diabetes mellitus   . Hypertension   . Bilateral lower extremity edema   . Venous stasis ulcers   . Atrial fibrillation     On Pradaxa  . Hyperlipidemia   . Morbid obesity   . DVT (deep venous thrombosis) 1994    LLE. Completed tx with coumadin  . Kidney stones   . Urinary retention 10/15/2011  . Pulmonary artery hypertension 10/18/2011    Past Surgical History  Procedure Laterality Date  . Hernia repair      Umbilical  . Cystoscopy  10/18/2011    Procedure: CYSTOSCOPY FLEXIBLE;  Surgeon: Ky Barban, MD;  Location: [facility];  Service: Urology;  Laterality: N/A;    Medications:  HOME MEDS: Prior to Admission medications   Medication Sig Start Date End Date Taking? Authorizing Provider  aspirin 325 MG tablet Take 81.25 mg by mouth daily. Takes one-quarter tablet (81.25 mg) daily   Yes Historical Provider, MD  dabigatran (PRADAXA) 150 MG CAPS Take 150 mg by mouth every 12 (twelve) hours.   Yes Historical Provider, MD  digoxin (LANOXIN) 0.125 MG tablet Take 1 tablet (0.125 mg total) by mouth daily. Medication for your  heart rate. 10/18/11 10/17/12 Yes [name], MD  diltiazem (TIAZAC) 360 MG 24 hr capsule Take 360 mg by mouth daily.   Yes Historical Provider, MD  Flaxseed, Linseed, (FLAX SEED OIL) 1000 MG CAPS Take 1-4 capsules by mouth daily.    Yes Historical Provider, MD  glipiZIDE (GLUCOTROL XL) 10 MG 24 hr tablet Take 10 mg by mouth 2 (two) times daily.   Yes Historical Provider, MD  linagliptin (TRADJENTA) 5 MG TABS tablet Take 5 mg by mouth daily.   Yes Historical Provider, MD  metFORMIN  (GLUCOPHAGE) 500 MG tablet Take 500 mg by mouth 2 (two) times daily.   Yes Historical Provider, MD  pravastatin (PRAVACHOL) 20 MG tablet Take 20 mg by mouth daily.   Yes Historical Provider, MD     Allergies:  Allergies  Allergen Reactions  . Furosemide Other (See Comments)    Dizziness , drowsiness    Social History:   reports that he has never smoked. He does not have any smokeless tobacco history on file. He reports that he does not drink alcohol. His drug history is not on file.  Family History: Family History  Problem Relation Age of Onset  . Diabetes Mother   . Emphysema Father      Physical Exam: Filed Vitals:   06/22/12 1229 06/22/12 1523 06/22/12 1655 06/22/12 1747  BP: 135/67 104/54 154/63 135/62  Pulse: 114 113  107  Temp: 98.7 F (37.1 C)   99.6 F (37.6 C)  TempSrc: Oral     Resp: 24 20 28 23   SpO2: 93% 94%  94%   Blood pressure 135/62, pulse 107, temperature 99.6 F (37.6 C), temperature source Oral, resp. rate 23, SpO2 94.00%.  GEN:  Pleasant obese Caucasian gentleman  lying bed in no acute distress; cooperative with exam PSYCH:  alert and oriented x4;  neither  anxious or depressed; affect is appropriate. HEENT: Mucous membranes pink, dry and anicteric; PERRLA; EOM intact; thick neck no carotid bruit; no JVD; Breasts:: Not examined CHEST WALL: No tenderness CHEST: Normal respiration, clear to auscultation bilaterally HEART: Regular rate and rhythm; no murmurs rubs or gallops BACK: No kyphosis no scoliosis; no CVA tenderness ABDOMEN: massively obese , soft non-tender; no masses, no organomegaly, normal abdominal bowel sounds;  large pannus; no intertriginous candida. Rectal Exam: Not done EXTREMITIES:  age-appropriate arthropathy of the hands and knees; both legs in Unna boots; but left leg from the top of the boot to above his knee markedly red and inflamed compared to the right  Genitalia: not examined PULSES: 2+ and symmetric SKIN: Normal hydration  no rash or ulceration, other than noted to his legs CNS: Cranial nerves 2-12 grossly intact no focal lateralizing neurologic deficit   Labs on Admission:  Basic Metabolic Panel:  Recent Labs Lab 06/22/12 1408  NA 133*  K 4.2  CL 96  CO2 24  GLUCOSE 392*  BUN 11  CREATININE 0.74  CALCIUM 9.3   Liver Function Tests:  Recent Labs Lab 06/22/12 1408  AST 28  ALT 23  ALKPHOS 70  BILITOT 1.4*  PROT 7.2  ALBUMIN 3.4*   No results found for this basename: LIPASE, AMYLASE,  in the last 168 hours No results found for this basename: AMMONIA,  in the last 168 hours CBC:  Recent Labs Lab 06/22/12 1408  WBC 28.8*  NEUTROABS 27.3*  HGB 13.7  HCT 42.5  MCV 88.0  PLT 271   Cardiac Enzymes: No results found for this basename: CKTOTAL,  CKMB, CKMBINDEX, TROPONINI,  in the last 168 hours BNP: No components found with this basename: POCBNP,  D-dimer: No components found with this basename: D-DIMER,  CBG:  Recent Labs Lab 06/22/12 1636 06/22/12 1746 06/22/12 1927  GLUCAP 388* 348* 299*    Radiological Exams on Admission: Dg Chest 2 View  06/22/2012  *RADIOLOGY REPORT*  Clinical Data: Fatigue.  Obesity.  Hyperglycemia.  CHEST - 2 VIEW  Comparison: 06/19/2003  Findings: Heart is enlarged.  There is pulmonary vascular congestion.  No overt edema.  The there are no focal consolidations or pleural effusions.  Degenerative changes are seen in the spine.  IMPRESSION: Cardiomegaly and vascular congestion.   Original Report Authenticated By: Norva Pavlov, M.D.    Dg Knee Complete 4 Views Left  06/22/2012  *RADIOLOGY REPORT*  Clinical Data: Fall.  Knee weakness and pain.  LEFT KNEE - COMPLETE 4+ VIEW  Comparison: None.  Findings: No evidence of fracture or dislocation.  No evidence of knee joint effusion.  Osteoarthritis is seen with moderate to severe medial joint space narrowing and secondary tibia varus.  IMPRESSION:  1.  No acute findings. 2.  Osteoarthritis with predominant  involvement of the medial compartment, and secondary tibia varus.   Original Report Authenticated By: Myles Rosenthal, M.D.     EKG: Independently reviewed. sinus rhythm no ST segment changes    Assessment/Plan Present on Admission:  . SIRS (systemic inflammatory response syndrome)  We'll treat with hydration, antibiotics after blood cultures. and correction of elevated blood sugars; monitor white count and left shift . Cellulitis of leg, left  The likely cause of SIRS . Hyperglycemia  There is no evidence of DKA nor hyperosmolar state; Once blood sugar at is in a reasonable range we'll switch to a sliding scale insulin with his home dose of antidiabetic medication  . Venous stasis ulcers  Continue Unna boots  . Morbid obesity  The diabetic low calorie diet  . Osteoarthritis of right knee  Patient will be mostly nonweightbearing in hospital physical therapy when he is improved    Other plans as per orders.  Code Status: FULL CODE  Family Communication:  discussed with patient at bedside  Disposition Plan:  likely home when more stable  Critical care time: 60 minutes.  Chiyoko Torrico Nocturnist [facility] Pager [phone]   06/22/2012, 8:38 PM

## 2012-06-22 NOTE — Progress Notes (Signed)
ANTIBIOTIC CONSULT NOTE-Preliminary  Pharmacy Consult for Vancomycin and Zosyn  Indication: Cellulitis  Allergies  Allergen Reactions  . Furosemide Other (See Comments)    Dizziness , drowsiness    Patient Measurements:   Weight 170kg in July 2013, Current Height and Weight pending.  Vital Signs: Temp: 99.6 F (37.6 C) (03/15 1747) Temp src: Oral (03/15 1229) BP: 135/62 mmHg (03/15 1747) Pulse Rate: 107 (03/15 1747)  Labs:  Recent Labs  06/22/12 1408  WBC 28.8*  HGB 13.7  PLT 271  CREATININE 0.74    The CrCl is unknown because both a height and weight (above a minimum accepted value) are required for this calculation.  No results found for this basename: VANCOTROUGH, VANCOPEAK, VANCORANDOM, GENTTROUGH, GENTPEAK, GENTRANDOM, TOBRATROUGH, TOBRAPEAK, TOBRARND, AMIKACINPEAK, AMIKACINTROU, AMIKACIN,  in the last 72 hours   Microbiology: No results found for this or any previous visit (from the past 720 hour(s)).  Medical History: Past Medical History  Diagnosis Date  . Type 2 diabetes mellitus   . Hypertension   . Bilateral lower extremity edema   . Venous stasis ulcers   . Atrial fibrillation     On Pradaxa  . Hyperlipidemia   . Morbid obesity   . DVT (deep venous thrombosis) 1994    LLE. Completed tx with coumadin  . Kidney stones   . Urinary retention 10/15/2011  . Pulmonary artery hypertension 10/18/2011    Medications:  Prescriptions prior to admission  Medication Sig Dispense Refill  . aspirin 325 MG tablet Take 81.25 mg by mouth daily. Takes one-quarter tablet (81.25 mg) daily      . dabigatran (PRADAXA) 150 MG CAPS Take 150 mg by mouth every 12 (twelve) hours.      . digoxin (LANOXIN) 0.125 MG tablet Take 1 tablet (0.125 mg total) by mouth daily. Medication for your heart rate.  30 tablet  2  . diltiazem (TIAZAC) 360 MG 24 hr capsule Take 360 mg by mouth daily.      . Flaxseed, Linseed, (FLAX SEED OIL) 1000 MG CAPS Take 1-4 capsules by mouth daily.        Marland Kitchen glipiZIDE (GLUCOTROL XL) 10 MG 24 hr tablet Take 10 mg by mouth 2 (two) times daily.      Marland Kitchen linagliptin (TRADJENTA) 5 MG TABS tablet Take 5 mg by mouth daily.      . metFORMIN (GLUCOPHAGE) 500 MG tablet Take 500 mg by mouth 2 (two) times daily.      . pravastatin (PRAVACHOL) 20 MG tablet Take 20 mg by mouth daily.        Assessment: Okay for Protocol.  Goal of Therapy:  Vancomycin trough level 10-15 mcg/ml  Plan:  Preliminary review of pertinent patient information completed.  Protocol will be initiated with a one-time dose(s) of Vancomycin 1500mg  x 1 and Zosyn 3.375gm IV every 8 hours.  I will complete review during morning rounds to assess patient and finalize treatment regimen.  Mady Gemma, [facility] 06/22/2012,8:48 PM

## 2012-06-22 NOTE — ED Provider Notes (Signed)
History     This chart was scribed for Gerhard Munch, MD, MD by Smitty Pluck, ED Scribe. The patient was seen in room APA04/[facility]04 and the patient's care was started at 1:07 PM.   CSN: [id number]  Arrival date & time 06/22/12  1203     Chief Complaint  Patient presents with  . Fall    The history is provided by the patient. No language interpreter was used.   Jonathan Wise is a 67 y.o. male with hx of DM, HTN, DVT (legs) and atrial fibrillation who presents to the Emergency Department complaining of fall today due to weakness and his L knee giving way. Pt uses walker at home for ambulation. Pt states he fell while walking to the bathroom. Patient was not able to ambulate after the fall and is complaining of weakness.  He states that he does not have any pain currently. Pt states that he has increased urinary frequency, nausea and chills. He denies changes in wounds on lower extremities. Pt denies fever, nausea, vomiting, diarrhea, cough, SOB and any other symptoms. Denies hx of MI and CHF, does have Hx of afib.  Prior Hx of knee repair on R  Pt reports that he stopped smoking cigarettes years ago.    Past Medical History  Diagnosis Date  . Type 2 diabetes mellitus   . Hypertension   . Bilateral lower extremity edema   . Venous stasis ulcers   . Atrial fibrillation     On Pradaxa  . Hyperlipidemia   . Morbid obesity   . DVT (deep venous thrombosis) 1994    LLE. Completed tx with coumadin  . Kidney stones   . Urinary retention 10/15/2011  . Pulmonary artery hypertension 10/18/2011    Past Surgical History  Procedure Laterality Date  . Hernia repair      Umbilical  . Cystoscopy  10/18/2011    Procedure: CYSTOSCOPY FLEXIBLE;  Surgeon: Ky Barban, MD;  Location: [facility];  Service: Urology;  Laterality: N/A;    No family history on file.  History  Substance Use Topics  . Smoking status: Never Smoker   . Smokeless tobacco: Not on file  . Alcohol Use: No       Review of Systems  Constitutional: Positive for chills.       Per HPI, otherwise negative  HENT:       Per HPI, otherwise negative  Respiratory:       Per HPI, otherwise negative  Cardiovascular:       Per HPI, otherwise negative  Endocrine:       Negative aside from HPI  Genitourinary: Positive for frequency.       Neg aside from HPI   Musculoskeletal:       Per HPI, otherwise negative  Skin: Negative.   Neurological: Negative for syncope.    Allergies  Furosemide  Home Medications   Current Outpatient Rx  Name  Route  Sig  Dispense  Refill  . aspirin 325 MG tablet   Oral   Take 81.25 mg by mouth daily. Takes one-quarter tablet (81.25 mg) daily         . dabigatran (PRADAXA) 150 MG CAPS   Oral   Take 150 mg by mouth every 12 (twelve) hours.         . digoxin (LANOXIN) 0.125 MG tablet   Oral   Take 1 tablet (0.125 mg total) by mouth daily. Medication for your heart rate.   30 tablet  2   . diltiazem (TIAZAC) 360 MG 24 hr capsule   Oral   Take 360 mg by mouth daily.         . Flaxseed, Linseed, (FLAX SEED OIL) 1000 MG CAPS   Oral   Take 1 capsule by mouth daily.         Marland Kitchen glipiZIDE (GLUCOTROL XL) 10 MG 24 hr tablet   Oral   Take 10 mg by mouth 2 (two) times daily.         . pioglitazone (ACTOS) 45 MG tablet   Oral   Take 45 mg by mouth daily.         . pravastatin (PRAVACHOL) 20 MG tablet   Oral   Take 20 mg by mouth daily.           BP 135/67  Pulse 114  Temp(Src) 98.7 F (37.1 C) (Oral)  Resp 24  SpO2 93%  Physical Exam  Nursing note and vitals reviewed. Constitutional: He is oriented to person, place, and time. He appears well-developed. No distress.  HENT:  Head: Normocephalic and atraumatic.  Eyes: Conjunctivae and EOM are normal.  Cardiovascular: Regular rhythm.  Tachycardia present.   Pulmonary/Chest: Effort normal. No stridor. No respiratory distress.  Abdominal: He exhibits no distension.  Musculoskeletal:  He exhibits no edema.  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  Skin: Skin is warm and dry.  Psychiatric: He has a normal mood and affect.    ED Course  Procedures (including critical care time) DIAGNOSTIC STUDIES: Oxygen Saturation is 93% on room air, low by my interpretation.    COORDINATION OF CARE: 1:11 PM Discussed ED treatment with pt and pt agrees.     Labs Reviewed  CBC WITH DIFFERENTIAL - Abnormal; Notable for the following:    WBC 28.8 (*)    Neutrophils Relative 95 (*)    Neutro Abs 27.3 (*)    Lymphocytes Relative 2 (*)    Lymphs Abs 0.4 (*)    All other components within normal limits  COMPREHENSIVE METABOLIC PANEL - Abnormal; Notable for the following:    Sodium 133 (*)    Glucose, Bld 392 (*)    Albumin 3.4 (*)    Total Bilirubin 1.4 (*)    All other components within normal limits  URINALYSIS, ROUTINE W REFLEX MICROSCOPIC - Abnormal; Notable for the following:    Glucose, UA >1000 (*)    Hgb urine dipstick TRACE (*)    Ketones, ur 15 (*)    All other components within normal limits  BLOOD GAS, VENOUS - Abnormal; Notable for the following:    pH, Ven 7.458 (*)    pCO2, Ven 35.1 (*)    pO2, Ven 52.1 (*)    Bicarbonate 24.6 (*)    All other components within normal limits  URINE MICROSCOPIC-ADD ON - Abnormal; Notable for the following:    Squamous Epithelial / LPF FEW (*)    All other components within normal limits  LACTIC ACID, PLASMA   Dg Knee Complete 4 Views Left  06/22/2012  *RADIOLOGY REPORT*  Clinical Data: Fall.  Knee weakness and pain.  LEFT KNEE - COMPLETE 4+ VIEW  Comparison: None.  Findings: No evidence of fracture or dislocation.  No evidence of knee joint effusion.  Osteoarthritis is seen with moderate to severe medial joint space narrowing and secondary tibia varus.  IMPRESSION:  1.  No acute findings. 2.  Osteoarthritis with  predominant involvement of the medial compartment, and secondary tibia varus.   Original Report Authenticated By: Myles Rosenthal, M.D.      No diagnosis found.  Cardiac: 110st, abnormal  O2- 95%Christie, abnormal   Date: 06/22/2012  Rate: 114  Rhythm: sinus tachycardia  QRS Axis: normal  Intervals: normal  ST/T Wave abnormalities: normal  Conduction Disutrbances:none  Narrative Interpretation:   Old EKG Reviewed: none available PVC - and tachy, otherwise unremarkable  Update: Patient remains clinically the same.  Mild tachycardia persists.  Patient continues to complain of generalized discomfort.   Update: Initial labs reviewed with the patient, including hyperglycemia, leukocytosis.  Patient continues to receive fluid hydration.  Now: Patient also has ketones in his urine.  The patient does not have evidence of the KA, there is some suspicion for hyperosmolar state.  With the patient's ketones, his leukocytosis, his hyperglycemia he was started on insulin stabilizer protocol.     MDM  I personally performed the services described in this documentation, which was scribed in my presence. The recorded information has been reviewed and is accurate.  This patient with multiple medical problems, including obesity, atrophic relation, pulmonary artery hypertension, non-insulin-dependent diabetes now presents after an episode of weakness, with generalized complaints, generalized fatigue.  On exam the patient is borderline hypoxic, as well as tachycardic, uncomfortable appearing.  The patient does have mild knee pain, though this will required additional evaluation after this episode of metabolic abnormalities is addressed. With consideration of hyperosmolar state, the patient required admission to the step down unit.  Notably, with the patient's leukocytosis, dyspnea, x-ray of the chest is pending on admission, though the patient's denial of significant subjective dyspnea is a somewhat reassuring,  and the patient is afebrile, with low suspicion for concurrent pneumonia.  The patient also has a history of pulmonary artery hypertension which is likely contributory to his hypoxia.  CRITICAL CARE Performed by: Gerhard Munch   Total critical care time: 35  Critical care time was exclusive of separately billable procedures and treating other patients.  Critical care was necessary to treat or prevent imminent or life-threatening deterioration.  Critical care was time spent personally by me on the following activities: development of treatment plan with patient and/or surrogate as well as nursing, discussions with consultants, evaluation of patient's response to treatment, examination of patient, obtaining history from patient or surrogate, ordering and performing treatments and interventions, ordering and review of laboratory studies, ordering and review of radiographic studies, pulse oximetry and re-evaluation of patient's condition.    Gerhard Munch, MD 06/22/12 [phone]

## 2012-06-22 NOTE — ED Notes (Signed)
EMS initially called out for a public assist to help patient out of the floor. Patient uses walker at home for ambulation. Patient was not able to ambulate after the fall and is complaining of weakness. No injuries noted.

## 2012-06-23 DIAGNOSIS — R55 Syncope and collapse: Secondary | ICD-10-CM | POA: Diagnosis present

## 2012-06-23 DIAGNOSIS — I4891 Unspecified atrial fibrillation: Secondary | ICD-10-CM

## 2012-06-23 DIAGNOSIS — I493 Ventricular premature depolarization: Secondary | ICD-10-CM | POA: Diagnosis present

## 2012-06-23 LAB — GLUCOSE, CAPILLARY
Glucose-Capillary: 232 mg/dL — ABNORMAL HIGH (ref 70–99)
Glucose-Capillary: 250 mg/dL — ABNORMAL HIGH (ref 70–99)
Glucose-Capillary: 252 mg/dL — ABNORMAL HIGH (ref 70–99)
Glucose-Capillary: 270 mg/dL — ABNORMAL HIGH (ref 70–99)

## 2012-06-23 LAB — BASIC METABOLIC PANEL
Calcium: 8.5 mg/dL (ref 8.4–10.5)
Chloride: 101 mEq/L (ref 96–112)
Creatinine, Ser: 0.73 mg/dL (ref 0.50–1.35)
GFR calc Af Amer: >90 mL/min (ref >90)

## 2012-06-23 LAB — HEMOGLOBIN A1C
Hgb A1c MFr Bld: 11.4 % — ABNORMAL HIGH (ref <5.7)
Mean Plasma Glucose: 280 mg/dL — ABNORMAL HIGH (ref <117)

## 2012-06-23 LAB — HEPATIC FUNCTION PANEL
ALT: 18 U/L (ref 0–53)
AST: 27 U/L (ref 0–37)
Albumin: 2.8 g/dL — ABNORMAL LOW (ref 3.5–5.2)
Alkaline Phosphatase: 117 U/L (ref 39–117)
Total Bilirubin: 1.1 mg/dL (ref 0.3–1.2)
Total Protein: 6.3 g/dL (ref 6.0–8.3)

## 2012-06-23 LAB — CBC
HCT: 36.6 % — ABNORMAL LOW (ref 39.0–52.0)
Platelets: 251 10*3/uL (ref 150–400)
RDW: 15.4 % (ref 11.5–15.5)
WBC: 16.2 10*3/uL — ABNORMAL HIGH (ref 4.0–10.5)

## 2012-06-23 LAB — TSH: TSH: 0.553 u[IU]/mL (ref 0.350–4.500)

## 2012-06-23 MED ORDER — POTASSIUM CHLORIDE IN NACL 20-0.9 MEQ/L-% IV SOLN
INTRAVENOUS | Status: DC
  Administered 2012-06-23: 14:00:00 via INTRAVENOUS

## 2012-06-23 MED ORDER — POTASSIUM CHLORIDE CRYS ER 20 MEQ PO TBCR
30.0000 meq | EXTENDED_RELEASE_TABLET | Freq: Two times a day (BID) | ORAL | Status: AC
  Administered 2012-06-23 (×2): 30 meq via ORAL
  Filled 2012-06-23 (×2): qty 1

## 2012-06-23 MED ORDER — POTASSIUM CHLORIDE IN NACL 20-0.9 MEQ/L-% IV SOLN
INTRAVENOUS | Status: DC
  Administered 2012-06-23: 02:00:00 via INTRAVENOUS

## 2012-06-23 MED ORDER — VANCOMYCIN HCL 10 G IV SOLR
1500.0000 mg | Freq: Two times a day (BID) | INTRAVENOUS | Status: DC
  Administered 2012-06-23 – 2012-06-24 (×3): 1500 mg via INTRAVENOUS
  Filled 2012-06-23 (×5): qty 1500

## 2012-06-23 NOTE — Progress Notes (Signed)
ANTIBIOTIC CONSULT NOTE  Pharmacy Consult for Vancomycin and Zosyn Indication: Cellulitis/SIRS  Allergies  Allergen Reactions  . Furosemide Other (See Comments)    Dizziness , drowsiness    Patient Measurements: Height: 6\' 1"  (185.4 cm) Weight: 345 lb 14.4 oz (156.9 kg) IBW/kg (Calculated) : 79.9  Vital Signs: Temp: 99.4 F (37.4 C) (03/16 0400) Temp src: Axillary (03/16 0400) BP: 111/50 mmHg (03/16 0600) Pulse Rate: 66 (03/16 0600) Intake/Output from previous day: 03/15 0701 - 03/16 0700 In: 600 [I.V.:600] Out: 225 [Urine:225] Intake/Output from this shift:   Labs:  Recent Labs  06/22/12 1408 06/23/12 0446  WBC 28.8* 16.2*  HGB 13.7 11.8*  PLT 271 251  CREATININE 0.74 0.73   Estimated Creatinine Clearance: 140.3 ml/min (by C-G formula based on Cr of 0.73). No results found for this basename: VANCOTROUGH, Leodis Binet, VANCORANDOM, GENTTROUGH, GENTPEAK, GENTRANDOM, TOBRATROUGH, TOBRAPEAK, TOBRARND, AMIKACINPEAK, AMIKACINTROU, AMIKACIN,  in the last 72 hours   Microbiology: Recent Results (from the past 720 hour(s))  MRSA PCR SCREENING     Status: None   Collection Time    06/22/12  7:43 PM      Result Value Range Status   MRSA by PCR NEGATIVE  NEGATIVE Final   Comment:            The GeneXpert MRSA Assay (FDA     approved for NASAL specimens     only), is one component of a     comprehensive MRSA colonization     surveillance program. It is not     intended to diagnose MRSA     infection nor to guide or     monitor treatment for     MRSA infections.  CULTURE, BLOOD (ROUTINE X 2)     Status: None   Collection Time    06/22/12  8:41 PM      Result Value Range Status   Specimen Description Blood   Final   Special Requests NONE   Final   Culture NO GROWTH 1 DAY   Final   Report Status PENDING   Incomplete  CULTURE, BLOOD (ROUTINE X 2)     Status: None   Collection Time    06/22/12  8:47 PM      Result Value Range Status   Specimen Description Blood    Final   Special Requests NONE   Final   Culture NO GROWTH 1 DAY   Final   Report Status PENDING   Incomplete    Medical History: Past Medical History  Diagnosis Date  . Type 2 diabetes mellitus   . Hypertension   . Bilateral lower extremity edema   . Venous stasis ulcers   . Atrial fibrillation     On Pradaxa  . Hyperlipidemia   . Morbid obesity   . DVT (deep venous thrombosis) 1994    LLE. Completed tx with coumadin  . Kidney stones   . Urinary retention 10/15/2011  . Pulmonary artery hypertension 10/18/2011   Medications:  Scheduled:  . acidophilus  2 capsule Oral Daily  . aspirin EC  81 mg Oral Daily  . dabigatran  150 mg Oral Q12H  . digoxin  0.125 mg Oral Daily  . diltiazem (CARDIZEM CD) 24 hr capsule 360 mg  360 mg Oral Daily  . glipiZIDE  10 mg Oral BID  . insulin aspart  0-15 Units Subcutaneous TID WC  . insulin aspart  0-5 Units Subcutaneous QHS  . linagliptin  5 mg Oral Daily  .  metFORMIN  500 mg Oral BID WC  . piperacillin-tazobactam (ZOSYN)  IV  3.375 g Intravenous Q8H  . potassium chloride  30 mEq Oral BID  . simvastatin  10 mg Oral q1800  . [COMPLETED] sodium chloride  1,000 mL Intravenous Once  . [COMPLETED] sodium chloride  1,000 mL Intravenous Once  . [COMPLETED] vancomycin  1,500 mg Intravenous Once  . vancomycin  1,500 mg Intravenous Q12H   Assessment: Okay for Protocol Estimated Creatinine Clearance: 140.3 ml/min (by C-G formula based on Cr of 0.73). Obese male being treated for cellulitis/SIRS. Normalized CrCl (140-67.244)/(0.73)*(1-0.15*(2-2 )) = 99.666 ml/min  Goal of Therapy:  Vancomycin trough level 15-20 mcg/ml  Plan:  Zosyn 3.375gm IV every 8 hours. Vancomycin 1500mg  IV every 12 hours. Measure antibiotic drug levels at steady state Follow up culture results  Mady Gemma 06/23/2012,8:29 AM

## 2012-06-23 NOTE — Progress Notes (Signed)
Insulin drip stopped per MD order. Sliding scale is initiated, pt received night coverage with Novolog per SS parameters.

## 2012-06-23 NOTE — Progress Notes (Signed)
Pt transferred to unit 300. Report given to Zannie Kehr RN. Vital signs stable at transfer.

## 2012-06-23 NOTE — Progress Notes (Signed)
Subjective: The patient feels "a whole lot better". No complaints of chest pain, shortness of breath, or abdominal pain. He says that the redness in his left leg has subsided some.   Objective: Vital signs in last 24 hours: Filed Vitals:   06/23/12 0200 06/23/12 0400 06/23/12 0500 06/23/12 0600  BP: 124/55 116/55  111/50  Pulse: 84 80  66  Temp:  99.4 F (37.4 C)    TempSrc:  Axillary    Resp: 30     Height:      Weight:   156.9 kg (345 lb 14.4 oz)   SpO2: 91% 93%  92%    Intake/Output Summary (Last 24 hours) at 06/23/12 0827 Last data filed at 06/23/12 0600  Gross per 24 hour  Intake    600 ml  Output    225 ml  Net    375 ml    Weight change:   Physical exam: General: Morbidly obese 67 year old Caucasian man sitting up in bed, in no acute distress. Lungs: Clear anteriorly with decreased breath sounds in the bases. Heart: Irregular, irregular. Abdomen: Morbidly obese, positive bowel sounds, soft, nontender, nondistended. Extremities: Left greater than proximal right lower extremity mild to moderate erythema. On the boots are still present on the distal lower extremities bilaterally. (Will be taken off later for examination). Globally, trace to 1+ nonpitting edema. Arthritic changes of both knees with mild tenderness palpated on the left knee. No acute hot red joints. No effusion palpated. Neurologic: He is alert and oriented x3. Cranial nerves II through XII are intact.   Lab Results: Basic Metabolic Panel:  Recent Labs  16/10/96 1408 06/23/12 0446  NA 133* 137  K 4.2 3.4*  CL 96 101  CO2 24 26  GLUCOSE 392* 223*  BUN 11 11  CREATININE 0.74 0.73  CALCIUM 9.3 8.5  MG  --  1.8   Liver Function Tests:  Recent Labs  06/22/12 1408  AST 28  28  ALT 23  23  ALKPHOS 70  68  BILITOT 1.4*  1.4*  PROT 7.2  7.3  ALBUMIN 3.4*  3.4*   No results found for this basename: LIPASE, AMYLASE,  in the last 72 hours No results found for this basename: AMMONIA,  in  the last 72 hours CBC:  Recent Labs  06/22/12 1408 06/23/12 0446  WBC 28.8* 16.2*  NEUTROABS 27.3*  --   HGB 13.7 11.8*  HCT 42.5 36.6*  MCV 88.0 88.4  PLT 271 251   Cardiac Enzymes: No results found for this basename: CKTOTAL, CKMB, CKMBINDEX, TROPONINI,  in the last 72 hours BNP: No results found for this basename: PROBNP,  in the last 72 hours D-Dimer: No results found for this basename: DDIMER,  in the last 72 hours CBG:  Recent Labs  06/22/12 1636 06/22/12 1746 06/22/12 1927 06/22/12 2032 06/22/12 2140 06/23/12 0744  GLUCAP 388* 348* 299* 345* 246* 232*   Hemoglobin A1C: No results found for this basename: HGBA1C,  in the last 72 hours Fasting Lipid Panel: No results found for this basename: CHOL, HDL, LDLCALC, TRIG, CHOLHDL, LDLDIRECT,  in the last 72 hours Thyroid Function Tests: No results found for this basename: TSH, T4TOTAL, FREET4, T3FREE, THYROIDAB,  in the last 72 hours Anemia Panel: No results found for this basename: VITAMINB12, FOLATE, FERRITIN, TIBC, IRON, RETICCTPCT,  in the last 72 hours Coagulation: No results found for this basename: LABPROT, INR,  in the last 72 hours Urine Drug Screen: Drugs of Abuse  No results found for this basename: labopia,  cocainscrnur,  labbenz,  amphetmu,  thcu,  labbarb    Alcohol Level: No results found for this basename: ETH,  in the last 72 hours Urinalysis:  Recent Labs  06/22/12 1345  COLORURINE YELLOW  LABSPEC 1.010  PHURINE 6.0  GLUCOSEU >1000*  HGBUR TRACE*  BILIRUBINUR NEGATIVE  KETONESUR 15*  PROTEINUR NEGATIVE  UROBILINOGEN 0.2  NITRITE NEGATIVE  LEUKOCYTESUR NEGATIVE   Misc. Labs:   Micro: Recent Results (from the past 240 hour(s))  MRSA PCR SCREENING     Status: None   Collection Time    06/22/12  7:43 PM      Result Value Range Status   MRSA by PCR NEGATIVE  NEGATIVE Final   Comment:            The GeneXpert MRSA Assay (FDA     approved for NASAL specimens     only), is one  component of a     comprehensive MRSA colonization     surveillance program. It is not     intended to diagnose MRSA     infection nor to guide or     monitor treatment for     MRSA infections.  CULTURE, BLOOD (ROUTINE X 2)     Status: None   Collection Time    06/22/12  8:41 PM      Result Value Range Status   Specimen Description Blood   Final   Special Requests NONE   Final   Culture NO GROWTH 1 DAY   Final   Report Status PENDING   Incomplete  CULTURE, BLOOD (ROUTINE X 2)     Status: None   Collection Time    06/22/12  8:47 PM      Result Value Range Status   Specimen Description Blood   Final   Special Requests NONE   Final   Culture NO GROWTH 1 DAY   Final   Report Status PENDING   Incomplete    Studies/Results: Dg Chest 2 View  06/22/2012  *RADIOLOGY REPORT*  Clinical Data: Fatigue.  Obesity.  Hyperglycemia.  CHEST - 2 VIEW  Comparison: 06/19/2003  Findings: Heart is enlarged.  There is pulmonary vascular congestion.  No overt edema.  The there are no focal consolidations or pleural effusions.  Degenerative changes are seen in the spine.  IMPRESSION: Cardiomegaly and vascular congestion.   Original Report Authenticated By: Norva Pavlov, M.D.    Dg Knee Complete 4 Views Left  06/22/2012  *RADIOLOGY REPORT*  Clinical Data: Fall.  Knee weakness and pain.  LEFT KNEE - COMPLETE 4+ VIEW  Comparison: None.  Findings: No evidence of fracture or dislocation.  No evidence of knee joint effusion.  Osteoarthritis is seen with moderate to severe medial joint space narrowing and secondary tibia varus.  IMPRESSION:  1.  No acute findings. 2.  Osteoarthritis with predominant involvement of the medial compartment, and secondary tibia varus.   Original Report Authenticated By: Myles Rosenthal, M.D.     Medications:  Scheduled: . acidophilus  2 capsule Oral Daily  . aspirin EC  81 mg Oral Daily  . dabigatran  150 mg Oral Q12H  . digoxin  0.125 mg Oral Daily  . diltiazem (CARDIZEM CD) 24 hr  capsule 360 mg  360 mg Oral Daily  . glipiZIDE  10 mg Oral BID  . insulin aspart  0-15 Units Subcutaneous TID WC  . insulin aspart  0-5 Units Subcutaneous QHS  .  linagliptin  5 mg Oral Daily  . metFORMIN  500 mg Oral BID WC  . piperacillin-tazobactam (ZOSYN)  IV  3.375 g Intravenous Q8H  . potassium chloride  30 mEq Oral BID  . simvastatin  10 mg Oral q1800  . vancomycin  1,500 mg Intravenous Q12H   Continuous: . 0.9 % NaCl with KCl 20 mEq / L     WUJ:[id number], ondansetron (ZOFRAN) IV, polyethylene glycol, sorbitol, traZODone  Assessment: Principal Problem:   SIRS (systemic inflammatory response syndrome) Active Problems:   Venous stasis ulcers   Cellulitis of leg, left   Ketonuria   Hyperglycemia   Morbid obesity   Osteoarthritis of right knee   Current use of long term anticoagulation   Atrial fibrillation   PVC's (premature ventricular contractions)   1. Generalized weakness, secondary to SIRS associated with left lower extremity cellulitis. Clinically improving.  SIRS associated with left lower extremity cellulitis. We'll continue vancomycin,  Zosyn, and volume repletion. He is afebrile. His white blood cell count is improving.  Left knee DJD, status post nontraumatic fall at home. We'll manage with analgesics as ordered. We'll order PT consultation.  Chronic venous stasis ulcers of both legs. Continue [employer].  Uncontrolled type 2 diabetes mellitus. We'll continue sliding scale NovoLog and chronic management with his oral hypoglycemic agents. The patient may benefit from Lantus at home.  Chronic atrial fibrillation with reported PVCs per nursing. EKG pending. His magnesium level is within normal limits. His serum potassium is borderline low. Will replete. His rate is controlled. He is anticoagulated with Pr.adaxa   Plan:   1. Continue IV antibiotics. 2. Decrease the rate of IV fluids. 3. Replete potassium chloride orally. We'll add potassium to  the IV fluids. 4. Change Unna boots. We'll examine the patient's lower extremities following their removal. 5. PT consult. 6. Transfer out of the ICU.   7. TSH/free-T4 and hemoglobin A1c ordered and pending.     LOS: 1 day   Sherran Margolis 06/23/2012, 8:27 AM

## 2012-06-24 LAB — CBC WITH DIFFERENTIAL/PLATELET
Eosinophils Absolute: 0.2 10*3/uL (ref 0.0–0.7)
Hemoglobin: 11.8 g/dL — ABNORMAL LOW (ref 13.0–17.0)
Lymphocytes Relative: 8 % — ABNORMAL LOW (ref 12–46)
Lymphs Abs: 0.8 10*3/uL (ref 0.7–4.0)
Monocytes Relative: 8 % (ref 3–12)
Neutro Abs: 7.6 10*3/uL (ref 1.7–7.7)
Neutrophils Relative %: 81 % — ABNORMAL HIGH (ref 43–77)
Platelets: 231 10*3/uL (ref 150–400)
RBC: 4.1 MIL/uL — ABNORMAL LOW (ref 4.22–5.81)
WBC: 9.4 10*3/uL (ref 4.0–10.5)

## 2012-06-24 LAB — GLUCOSE, CAPILLARY
Glucose-Capillary: 230 mg/dL — ABNORMAL HIGH (ref 70–99)
Glucose-Capillary: 245 mg/dL — ABNORMAL HIGH (ref 70–99)

## 2012-06-24 LAB — BASIC METABOLIC PANEL
BUN: 15 mg/dL (ref 6–23)
Chloride: 101 mEq/L (ref 96–112)
GFR calc Af Amer: >90 mL/min (ref >90)
GFR calc non Af Amer: 88 mL/min — ABNORMAL LOW (ref >90)
Glucose, Bld: 229 mg/dL — ABNORMAL HIGH (ref 70–99)
Potassium: 4 mEq/L (ref 3.5–5.1)
Sodium: 135 mEq/L (ref 135–145)

## 2012-06-24 MED ORDER — AMOXICILLIN-POT CLAVULANATE 500-125 MG PO TABS: 1.0000 | ORAL_TABLET | Freq: Two times a day (BID) | ORAL | Status: DC

## 2012-06-24 MED ORDER — BD GETTING STARTED TAKE HOME KIT: 3/10ML X 30G SYRINGES
1.0000 | Freq: Once | Status: AC
  Administered 2012-06-24: 1
  Filled 2012-06-24: qty 1

## 2012-06-24 MED ORDER — BD GETTING STARTED TAKE HOME KIT: 3/10ML X 30G SYRINGES: 1.0000 | Freq: Once | Status: DC

## 2012-06-24 MED ORDER — INSULIN DETEMIR 100 UNIT/ML ~~LOC~~ SOLN: 20.0000 [IU] | Freq: Every day | SUBCUTANEOUS | Status: DC

## 2012-06-24 NOTE — Progress Notes (Signed)
PT Cancellation Note  Patient Details Name: Jonathan Wise MRN: [id number] DOB: 02-24-1946   Cancelled Treatment:    Reason Eval/Treat Not Completed:  (pt declines PT) Pt states that he has been getting up and walking in his room.  He states that he has no need for PT.  I asked if he had any questions or concerns that I could help him with and he said "no".  He is very anxious to go home and is expecting to go home this afternoon.  Myrlene Broker L 06/24/2012, 1:11 PM

## 2012-06-24 NOTE — Progress Notes (Signed)
Inpatient Diabetes Program Recommendations  AACE/ADA: New Consensus Statement on Inpatient Glycemic Control (2013)  Target Ranges:  Prepandial:   less than 140 mg/dL      Peak postprandial:   less than 180 mg/dL (1-2 hours)      Critically ill patients:  140 - 180 mg/dL    Note: Spoke with pt about diabetes. Discussed A1C results (11.4%) with him and explained what an A1C is, basic pathophysiology of DM Type 2, basic home care, importance of checking CBGs and maintaining good CBG control to prevent long-term and short-term complications. Reviewed signs and symptoms of hyperglycemia and hypoglycemia along with proper treatment. Provided patient with insulin syringe starter kit, Living Well with Diabetes, and a diabetes tracker booklet. Have asked patient to check his blood sugar four times a day (before breakfast, before lunch, before supper, and at bedtime) and to keep a record of his blood sugars to take with him to his follow up appointment with his primary doctor.  Patient verbalizes that he understands instructions and will do as requested.  Reviewed insulin injection technique and patient was able to demonstrate proper technique of drawing up insulin via syringe and proper technique of administering insulin injection.  In talking with the patient, he states that he does not always follow a diabetic diet and he drinks a lot of juice.  Explained how the carbohydrates he takes in from food and drinks turn into sugar and impact his glucose levels.  Patient verbalizes understanding and states that he plans to do much better and change his diet so that perhaps he does not have to stay on insulin.  Patient did ask about cost of insulin and I instructed him to call his insurance company and ask about how much his co-pay will be.  Also provided patient with a savings card for Levemir which will save him up to $50 off his first prescription out of pocket cost of Levemir and up to $25 on his next three  prescriptions.  RNs to provide ongoing basic DM education at bedside with this patient.  When asked if patient has any additional issues, questions, or concerns patient states that he has no further questions at this time.  Thanks, Orlando Penner, RN, BSN, CCRN Diabetes Coordinator Inpatient Diabetes Program [phone]

## 2012-06-24 NOTE — Consult Note (Signed)
    CARDIOLOGY CONSULT NOTE    Error  

## 2012-06-24 NOTE — Progress Notes (Signed)
Pt was taught how to administer his insulin.  He returned demonstration by administering his lunch time dose of 5 units of novolog.  I also changed the patients left leg dressing.  He refused to let me change the one on the right.  He stated he would change it once he got home.  Pt was given prescriptions, care notes, and instructions.  He verbalized understanding.  Pt left the floor via w/c with staff in stable condition.  I text paged Dr. Sherrie Mustache and notified her that he refused PT.

## 2012-06-24 NOTE — Care Management Note (Signed)
    Page 1 of 1   06/24/2012     3:18:56 PM   CARE MANAGEMENT NOTE 06/24/2012  Patient:  Jonathan, Wise   Account Number:  [id number]  Date Initiated:  06/24/2012  Documentation initiated by:  Rosemary Holms  Subjective/Objective Assessment:   Pt admitted with cellulitis. Lives at home with spouse. Declines need for HH.     Action/Plan:   Anticipated DC Date:  06/24/2012   Anticipated DC Plan:  HOME/SELF CARE      DC Planning Services  CM consult      Choice offered to / List presented to:             Status of service:  Completed, signed off Medicare Important Message given?  NA - LOS <3 / Initial given by admissions (If response is "NO", the following Medicare IM given date fields will be blank) Date Medicare IM given:   Date Additional Medicare IM given:    Discharge Disposition:  HOME/SELF CARE  Per UR Regulation:    If discussed at Long Length of Stay Meetings, dates discussed:    Comments:  06/24/12 Rosemary Holms RN BSN CM

## 2012-06-24 NOTE — Discharge Summary (Signed)
Physician Discharge Summary  Jonathan Wise ZOX:[id number] DOB: [birth year] DOA: 06/22/2012  PCP: Pamelia Hoit, MD  Admit date: 06/22/2012 Discharge date: 06/24/2012  Time spent: Greater than 30 minutes  Recommendations for Outpatient Follow-up:  1. The patient was instructed to continue Unna boot dressing changes of both legs.  Discharge Diagnoses:  1. SIRS secondary to left lower extremity cellulitis and query urinary tract infection. 2. Bilateral lower extremity venous stasis ulcers, chronic. 3. Uncontrolled type 2 diabetes mellitus. Hemoglobin A1c was 11.4. 4. Osteoarthritis of both knees, status post nontraumatic fall at home. 5. Chronic atrial fibrillation, on chronic anticoagulation. 6. Occasional PVCs. 7. Morbid obesity. 8. Mild hyponatremia. Resolved.  Discharge Condition: Improved.  Diet recommendation: Heart healthy and carbohydrate modified.  Filed Weights   06/23/12 0000 06/23/12 0500  Weight: 155.493 kg (342 lb 12.8 oz) 156.9 kg (345 lb 14.4 oz)    History of present illness:  The patient is a 67 year old man with a history significant for morbid obesity, diabetes mellitus, and chronic venous stasis ulcers of both legs, who presented to the emergency department on 06/22/2012 with a chief complaint of generalized weakness, cold chills, and nausea. He also complained of symptomatology consistent with polyuria and polydipsia. He also stated that his left leg gave way and he fell. There was no trauma. In the emergency department, he was afebrile and mildly tachycardic with a heart rate of 107 beats per minute. His blood pressure was within normal limits. His lab data were significant for a WBC of 28.8, serum sodium of 133, glucose of 392, and a urinalysis that reveals greater than 1000 glucose, mild ketones, 11-20 WBCs, and rare bacteria. His EKG reveals sinus tachycardia with PVCs. He was admitted for further evaluation and management.  Hospital Course:  The patient was  noted to have left leg cellulitis, and this was felt to be the source of his infection/SIRS. His urinalysis was abnormal, but it was not necessarily thought to be a source of infection. The urine culture was not ordered. He was started on IV fluid hydration. He was started on vancomycin and Zosyn. For treatment of his uncontrolled diabetes, he was restarted on his oral medications. However, sliding scale NovoLog and Lantus were added as well. He was continued on  Pradaxa and diltiazem for treatment of chronic atrial fibrillation. His Unna boots were left on and eventually taken off when they were scheduled to be changed. He was treated supportively for pain as needed.  For further evaluation, a number of studies were ordered. X-ray of his left knee revealed osteoarthritis. His chest x-ray revealed vascular congestion. His blood cultures have been negative to date. His TSH was within normal limits at 0.5. His free T4 was within normal limits at 1.27. Both his blood magnesium and phosphorus levels were within normal limits. His hemoglobin A1c was 11.4, indicating poor outpatient control of his diabetes.  Over the course of the hospitalization, the patient improved clinically and symptomatically. There was significantly less erythema of his left leg. He still had a few noninfected appearing stasis ulcers on both legs. His white blood cell count completely normalized. He remained afebrile. Because of his uncontrolled diabetes, once daily dosing of Levimer was recommended for additional management following discharge. He was informed that better control of his diabetes would help with wound healing. The patient was receptive. He was instructed on insulin injections. He was discharged on 20 units of Levimer nightly. He was discharged on further antibiotic therapy with Augmentin twice a day to  be taken for 7 more days. He manages his wound care himself at home with the assistance of his  family.    Procedures:  None  Consultations:  None  Discharge Exam: Filed Vitals:   06/23/12 1933 06/23/12 2125 06/23/12 2241 06/24/12 0633  BP: 105/41 114/50  120/60  Pulse: 69 65 63 64  Temp: 98.5 F (36.9 C) 98.1 F (36.7 C)  98.3 F (36.8 C)  TempSrc: Oral Oral  Oral  Resp: 20 20 20 20   Height:      Weight:      SpO2: 96% 98% 93% 94%    General: Morbidly obese 67 year old man sitting up in bed, in no acute distress. Cardiovascular: Irregular, irregular. Respiratory: Clear to auscultation bilaterally. Extremities: Mild erythema of the left leg with a few ulcerated areas on the pretibial and medial surfaces. 1+ global nonpitting edema of the left leg. Scant nonpitting edema and erythema of the right lower extremity. Arthritic changes noted in both knees.  Discharge Instructions  Discharge Orders   Future Orders Complete By Expires     Diet - low sodium heart healthy  As directed     Diet Carb Modified  As directed     Discharge instructions  As directed     Comments:      Take new medications as prescribed. Check your blood sugars at least twice daily, ideally 3 times daily.    Increase activity slowly  As directed         Medication List    TAKE these medications       amoxicillin-clavulanate 500-125 MG per tablet  Commonly known as:  AUGMENTIN  Take 1 tablet (500 mg total) by mouth 2 (two) times daily. Take antibiotics for 7 more days.     aspirin 325 MG tablet  Take 81.25 mg by mouth daily. Takes one-quarter tablet (81.25 mg) daily     dabigatran 150 MG Caps  Commonly known as:  PRADAXA  Take 150 mg by mouth every 12 (twelve) hours.     digoxin 0.125 MG tablet  Commonly known as:  LANOXIN  Take 1 tablet (0.125 mg total) by mouth daily. Medication for your heart rate.     diltiazem 360 MG 24 hr capsule  Commonly known as:  TIAZAC  Take 360 mg by mouth daily.     Flax Seed Oil 1000 MG Caps  Take 1-4 capsules by mouth daily.     glipiZIDE 10  MG 24 hr tablet  Commonly known as:  GLUCOTROL XL  Take 10 mg by mouth 2 (two) times daily.     insulin detemir 100 UNIT/ML injection  Commonly known as:  LEVEMIR  Inject 20 Units into the skin at bedtime.     linagliptin 5 MG Tabs tablet  Commonly known as:  TRADJENTA  Take 5 mg by mouth daily.     metFORMIN 500 MG tablet  Commonly known as:  GLUCOPHAGE  Take 500 mg by mouth 2 (two) times daily.     pravastatin 20 MG tablet  Commonly known as:  PRAVACHOL  Take 20 mg by mouth daily.           Follow-up Information   Follow up with Pamelia Hoit, MD. Schedule an appointment as soon as possible for a visit in 1 week.   Contact information:   [address] [place] [address] [phone]        The results of significant diagnostics from this hospitalization (including imaging, microbiology,  ancillary and laboratory) are listed below for reference.    Significant Diagnostic Studies: Dg Chest 2 View  06/22/2012  *RADIOLOGY REPORT*  Clinical Data: Fatigue.  Obesity.  Hyperglycemia.  CHEST - 2 VIEW  Comparison: 06/19/2003  Findings: Heart is enlarged.  There is pulmonary vascular congestion.  No overt edema.  The there are no focal consolidations or pleural effusions.  Degenerative changes are seen in the spine.  IMPRESSION: Cardiomegaly and vascular congestion.   Original Report Authenticated By: Norva Pavlov, M.D.    Dg Knee Complete 4 Views Left  06/22/2012  *RADIOLOGY REPORT*  Clinical Data: Fall.  Knee weakness and pain.  LEFT KNEE - COMPLETE 4+ VIEW  Comparison: None.  Findings: No evidence of fracture or dislocation.  No evidence of knee joint effusion.  Osteoarthritis is seen with moderate to severe medial joint space narrowing and secondary tibia varus.  IMPRESSION:  1.  No acute findings. 2.  Osteoarthritis with predominant involvement of the medial compartment, and secondary tibia varus.   Original Report Authenticated By: Myles Rosenthal, M.D.      Microbiology: Recent Results (from the past 240 hour(s))  MRSA PCR SCREENING     Status: None   Collection Time    06/22/12  7:43 PM      Result Value Range Status   MRSA by PCR NEGATIVE  NEGATIVE Final   Comment:            The GeneXpert MRSA Assay (FDA     approved for NASAL specimens     only), is one component of a     comprehensive MRSA colonization     surveillance program. It is not     intended to diagnose MRSA     infection nor to guide or     monitor treatment for     MRSA infections.  CULTURE, BLOOD (ROUTINE X 2)     Status: None   Collection Time    06/22/12  8:41 PM      Result Value Range Status   Specimen Description Blood   Final   Special Requests NONE   Final   Culture NO GROWTH 2 DAYS   Final   Report Status PENDING   Incomplete  CULTURE, BLOOD (ROUTINE X 2)     Status: None   Collection Time    06/22/12  8:47 PM      Result Value Range Status   Specimen Description Blood   Final   Special Requests NONE   Final   Culture NO GROWTH 2 DAYS   Final   Report Status PENDING   Incomplete     Labs: Basic Metabolic Panel:  Recent Labs Lab 06/22/12 1408 06/23/12 0446 06/23/12 0800 06/24/12 0436  NA 133* 137  --  135  K 4.2 3.4*  --  4.0  CL 96 101  --  101  CO2 24 26  --  24  GLUCOSE 392* 223*  --  229*  BUN 11 11  --  15  CREATININE 0.74 0.73  --  0.85  CALCIUM 9.3 8.5  --  8.2*  MG  --  1.8  --   --   PHOS  --   --  2.6  --    Liver Function Tests:  Recent Labs Lab 06/22/12 1408 06/23/12 0824  AST 28  28 27   ALT 23  23 18   ALKPHOS 70  68 117  BILITOT 1.4*  1.4* 1.1  PROT 7.2  7.3 6.3  ALBUMIN 3.4*  3.4* 2.8*   No results found for this basename: LIPASE, AMYLASE,  in the last 168 hours No results found for this basename: AMMONIA,  in the last 168 hours CBC:  Recent Labs Lab 06/22/12 1408 06/23/12 0446 06/24/12 0436  WBC 28.8* 16.2* 9.4  NEUTROABS 27.3*  --  7.6  HGB 13.7 11.8* 11.8*  HCT 42.5 36.6* 36.8*  MCV 88.0  88.4 89.8  PLT 271 251 231   Cardiac Enzymes: No results found for this basename: CKTOTAL, CKMB, CKMBINDEX, TROPONINI,  in the last 168 hours BNP: BNP (last 3 results)  Recent Labs  10/16/11 0538  PROBNP 695.0*   CBG:  Recent Labs Lab 06/23/12 1626 06/23/12 1704 06/23/12 2114 06/24/12 0809 06/24/12 1211  GLUCAP 252* 261* 270* 230* 245*       Signed:  Tonyia Marschall  [facility] 06/24/2012, 12:40 PM

## 2012-06-24 NOTE — Progress Notes (Signed)
UR Chart Review Completed  

## 2012-06-27 LAB — CULTURE, BLOOD (ROUTINE X 2): Culture: NO GROWTH

## 2012-08-28 ENCOUNTER — Encounter (INDEPENDENT_AMBULATORY_CARE_PROVIDER_SITE_OTHER): Payer: Self-pay | Admitting: Ophthalmology

## 2014-03-15 ENCOUNTER — Encounter (HOSPITAL_COMMUNITY): Payer: Self-pay

## 2014-03-15 ENCOUNTER — Inpatient Hospital Stay (HOSPITAL_COMMUNITY)
Admission: EM | Admit: 2014-03-15 | Discharge: 2014-03-16 | DRG: 315 | Disposition: A | Discharge status: Home / Self Care | Payer: Medicare Other | Attending: Internal Medicine | Admitting: Internal Medicine

## 2014-03-15 ENCOUNTER — Emergency Department (HOSPITAL_COMMUNITY): Payer: Medicare Other

## 2014-03-15 DIAGNOSIS — Z825 Family history of asthma and other chronic lower respiratory diseases: Secondary | ICD-10-CM

## 2014-03-15 DIAGNOSIS — Z833 Family history of diabetes mellitus: Secondary | ICD-10-CM | POA: Diagnosis not present

## 2014-03-15 DIAGNOSIS — Z79899 Other long term (current) drug therapy: Secondary | ICD-10-CM

## 2014-03-15 DIAGNOSIS — I952 Hypotension due to drugs: Secondary | ICD-10-CM

## 2014-03-15 DIAGNOSIS — D72829 Elevated white blood cell count, unspecified: Secondary | ICD-10-CM

## 2014-03-15 DIAGNOSIS — E785 Hyperlipidemia, unspecified: Secondary | ICD-10-CM | POA: Diagnosis not present

## 2014-03-15 DIAGNOSIS — I4891 Unspecified atrial fibrillation: Secondary | ICD-10-CM | POA: Diagnosis present

## 2014-03-15 DIAGNOSIS — Z86718 Personal history of other venous thrombosis and embolism: Secondary | ICD-10-CM | POA: Diagnosis not present

## 2014-03-15 DIAGNOSIS — I959 Hypotension, unspecified: Secondary | ICD-10-CM | POA: Diagnosis present

## 2014-03-15 DIAGNOSIS — E872 Acidosis, unspecified: Secondary | ICD-10-CM

## 2014-03-15 DIAGNOSIS — N390 Urinary tract infection, site not specified: Secondary | ICD-10-CM | POA: Diagnosis not present

## 2014-03-15 DIAGNOSIS — R001 Bradycardia, unspecified: Secondary | ICD-10-CM | POA: Diagnosis not present

## 2014-03-15 DIAGNOSIS — Z6841 Body Mass Index (BMI) 40.0 and over, adult: Secondary | ICD-10-CM

## 2014-03-15 DIAGNOSIS — Z792 Long term (current) use of antibiotics: Secondary | ICD-10-CM | POA: Diagnosis not present

## 2014-03-15 DIAGNOSIS — R531 Weakness: Secondary | ICD-10-CM | POA: Diagnosis present

## 2014-03-15 DIAGNOSIS — Z794 Long term (current) use of insulin: Secondary | ICD-10-CM

## 2014-03-15 DIAGNOSIS — E119 Type 2 diabetes mellitus without complications: Secondary | ICD-10-CM | POA: Diagnosis not present

## 2014-03-15 DIAGNOSIS — I10 Essential (primary) hypertension: Secondary | ICD-10-CM | POA: Diagnosis not present

## 2014-03-15 DIAGNOSIS — Z7982 Long term (current) use of aspirin: Secondary | ICD-10-CM | POA: Diagnosis not present

## 2014-03-15 DIAGNOSIS — Z7901 Long term (current) use of anticoagulants: Secondary | ICD-10-CM | POA: Diagnosis not present

## 2014-03-15 LAB — I-STAT CHEM 8, ED
BUN: 22 mg/dL (ref 6–23)
CALCIUM ION: 1.17 mmol/L (ref 1.13–1.30)
CREATININE: 1.1 mg/dL (ref 0.50–1.35)
Chloride: 104 mEq/L (ref 96–112)
GLUCOSE: 290 mg/dL — AB (ref 70–99)
HCT: 43 % (ref 39.0–52.0)
HEMOGLOBIN: 14.6 g/dL (ref 13.0–17.0)
Potassium: 4.6 mEq/L (ref 3.7–5.3)
Sodium: 140 mEq/L (ref 137–147)
TCO2: 24 mmol/L (ref 0–100)

## 2014-03-15 LAB — COMPREHENSIVE METABOLIC PANEL
ALBUMIN: 3.3 g/dL — AB (ref 3.5–5.2)
ALK PHOS: 71 U/L (ref 39–117)
ALT: 18 U/L (ref 0–53)
ANION GAP: 14 (ref 5–15)
AST: 24 U/L (ref 0–37)
BILIRUBIN TOTAL: 0.7 mg/dL (ref 0.3–1.2)
BUN: 23 mg/dL (ref 6–23)
CHLORIDE: 103 meq/L (ref 96–112)
CO2: 22 mEq/L (ref 19–32)
Calcium: 8.7 mg/dL (ref 8.4–10.5)
Creatinine, Ser: 1.14 mg/dL (ref 0.50–1.35)
GFR calc Af Amer: 74 mL/min — ABNORMAL LOW (ref >90)
GFR calc non Af Amer: 64 mL/min — ABNORMAL LOW (ref >90)
Glucose, Bld: 295 mg/dL — ABNORMAL HIGH (ref 70–99)
POTASSIUM: 4.8 meq/L (ref 3.7–5.3)
SODIUM: 139 meq/L (ref 137–147)
TOTAL PROTEIN: 6.9 g/dL (ref 6.0–8.3)

## 2014-03-15 LAB — CBC WITH DIFFERENTIAL/PLATELET
BASOS ABS: 0 10*3/uL (ref 0.0–0.1)
BASOS PCT: 0 % (ref 0–1)
EOS ABS: 0.1 10*3/uL (ref 0.0–0.7)
Eosinophils Relative: 1 % (ref 0–5)
HEMATOCRIT: 40 % (ref 39.0–52.0)
HEMOGLOBIN: 12.4 g/dL — AB (ref 13.0–17.0)
LYMPHS ABS: 0.9 10*3/uL (ref 0.7–4.0)
Lymphocytes Relative: 7 % — ABNORMAL LOW (ref 12–46)
MCH: 27.3 pg (ref 26.0–34.0)
MCHC: 31 g/dL (ref 30.0–36.0)
MCV: 88.1 fL (ref 78.0–100.0)
Monocytes Absolute: 0.7 10*3/uL (ref 0.1–1.0)
Monocytes Relative: 6 % (ref 3–12)
NEUTROS PCT: 86 % — AB (ref 43–77)
Neutro Abs: 10.8 10*3/uL — ABNORMAL HIGH (ref 1.7–7.7)
PLATELETS: 262 10*3/uL (ref 150–400)
RBC: 4.54 MIL/uL (ref 4.22–5.81)
RDW: 14.8 % (ref 11.5–15.5)
WBC: 12.5 10*3/uL — AB (ref 4.0–10.5)

## 2014-03-15 LAB — URINALYSIS, ROUTINE W REFLEX MICROSCOPIC
BILIRUBIN URINE: NEGATIVE
Glucose, UA: >1000 mg/dL — AB
Hgb urine dipstick: NEGATIVE
KETONES UR: NEGATIVE mg/dL
NITRITE: NEGATIVE
SPECIFIC GRAVITY, URINE: 1.02 (ref 1.005–1.030)
UROBILINOGEN UA: 0.2 mg/dL (ref 0.0–1.0)
pH: 5 (ref 5.0–8.0)

## 2014-03-15 LAB — GLUCOSE, CAPILLARY
Glucose-Capillary: 126 mg/dL — ABNORMAL HIGH (ref 70–99)
Glucose-Capillary: 124 mg/dL — ABNORMAL HIGH (ref 70–99)

## 2014-03-15 LAB — URINE MICROSCOPIC-ADD ON

## 2014-03-15 LAB — LACTIC ACID, PLASMA: Lactic Acid, Venous: 2.3 mmol/L — ABNORMAL HIGH (ref 0.5–2.2)

## 2014-03-15 LAB — DIGOXIN LEVEL: Digoxin Level: 0.8 ng/mL (ref 0.8–2.0)

## 2014-03-15 LAB — PROTIME-INR
INR: 1.12 (ref 0.00–1.49)
PROTHROMBIN TIME: 14.5 s (ref 11.6–15.2)

## 2014-03-15 MED ORDER — INSULIN DETEMIR 100 UNIT/ML ~~LOC~~ SOLN
20.0000 [IU] | Freq: Every day | SUBCUTANEOUS | Status: DC
  Administered 2014-03-15: 20 [IU] via SUBCUTANEOUS
  Filled 2014-03-15 (×2): qty 0.2

## 2014-03-15 MED ORDER — SENNOSIDES-DOCUSATE SODIUM 8.6-50 MG PO TABS: 1.0000 | ORAL_TABLET | Freq: Every evening | ORAL | Status: DC | PRN

## 2014-03-15 MED ORDER — ACETAMINOPHEN 325 MG PO TABS: 650.0000 mg | ORAL_TABLET | Freq: Four times a day (QID) | ORAL | Status: DC | PRN

## 2014-03-15 MED ORDER — ASPIRIN EC 81 MG PO TBEC
81.0000 mg | DELAYED_RELEASE_TABLET | Freq: Every day | ORAL | Status: DC
  Administered 2014-03-16: 81 mg via ORAL
  Filled 2014-03-15: qty 1

## 2014-03-15 MED ORDER — SODIUM CHLORIDE 0.9 % IV BOLUS (SEPSIS)
1000.0000 mL | Freq: Once | INTRAVENOUS | Status: AC
  Administered 2014-03-15: 1000 mL via INTRAVENOUS

## 2014-03-15 MED ORDER — CANAGLIFLOZIN 100 MG PO TABS
100.0000 mg | ORAL_TABLET | Freq: Every day | ORAL | Status: DC
  Administered 2014-03-16: 100 mg via ORAL
  Filled 2014-03-15 (×3): qty 1

## 2014-03-15 MED ORDER — CEFTRIAXONE SODIUM IN DEXTROSE 20 MG/ML IV SOLN
1.0000 g | INTRAVENOUS | Status: DC
  Administered 2014-03-15: 1 g via INTRAVENOUS
  Filled 2014-03-15 (×2): qty 50

## 2014-03-15 MED ORDER — LISINOPRIL 5 MG PO TABS
5.0000 mg | ORAL_TABLET | Freq: Every day | ORAL | Status: DC
  Administered 2014-03-16: 5 mg via ORAL
  Filled 2014-03-15: qty 1

## 2014-03-15 MED ORDER — OXYCODONE HCL 5 MG PO TABS: 5.0000 mg | ORAL_TABLET | ORAL | Status: DC | PRN

## 2014-03-15 MED ORDER — VERAPAMIL HCL 120 MG PO TABS
240.0000 mg | ORAL_TABLET | Freq: Every day | ORAL | Status: DC
  Administered 2014-03-16: 240 mg via ORAL
  Filled 2014-03-15: qty 2

## 2014-03-15 MED ORDER — PIOGLITAZONE HCL 15 MG PO TABS
45.0000 mg | ORAL_TABLET | Freq: Every day | ORAL | Status: DC
  Administered 2014-03-16: 45 mg via ORAL
  Filled 2014-03-15: qty 1
  Filled 2014-03-15 (×2): qty 3

## 2014-03-15 MED ORDER — ONDANSETRON HCL 4 MG/2ML IJ SOLN: 4.0000 mg | Freq: Four times a day (QID) | INTRAMUSCULAR | Status: DC | PRN

## 2014-03-15 MED ORDER — ACETAMINOPHEN 650 MG RE SUPP: 650.0000 mg | Freq: Four times a day (QID) | RECTAL | Status: DC | PRN

## 2014-03-15 MED ORDER — PNEUMOCOCCAL VAC POLYVALENT 25 MCG/0.5ML IJ INJ
0.5000 mL | INJECTION | INTRAMUSCULAR | Status: AC
  Administered 2014-03-16: 0.5 mL via INTRAMUSCULAR
  Filled 2014-03-15: qty 0.5

## 2014-03-15 MED ORDER — INSULIN ASPART 100 UNIT/ML ~~LOC~~ SOLN
6.0000 [IU] | Freq: Three times a day (TID) | SUBCUTANEOUS | Status: DC
  Administered 2014-03-15 – 2014-03-16 (×3): 6 [IU] via SUBCUTANEOUS

## 2014-03-15 MED ORDER — SODIUM CHLORIDE 0.9 % IV SOLN: INTRAVENOUS | Status: DC

## 2014-03-15 MED ORDER — ATROPINE SULFATE 0.1 MG/ML IJ SOLN
1.0000 mg | Freq: Once | INTRAMUSCULAR | Status: AC
  Administered 2014-03-15: 1 mg via INTRAVENOUS
  Filled 2014-03-15: qty 10

## 2014-03-15 MED ORDER — METFORMIN HCL 500 MG PO TABS
500.0000 mg | ORAL_TABLET | Freq: Two times a day (BID) | ORAL | Status: DC
  Filled 2014-03-15: qty 1

## 2014-03-15 MED ORDER — DIGOXIN 125 MCG PO TABS
0.1250 mg | ORAL_TABLET | Freq: Every day | ORAL | Status: DC
  Administered 2014-03-16: 0.125 mg via ORAL
  Filled 2014-03-15: qty 1

## 2014-03-15 MED ORDER — INSULIN ASPART 100 UNIT/ML ~~LOC~~ SOLN
0.0000 [IU] | Freq: Three times a day (TID) | SUBCUTANEOUS | Status: DC
  Administered 2014-03-15: 3 [IU] via SUBCUTANEOUS

## 2014-03-15 MED ORDER — SODIUM CHLORIDE 0.9 % IV SOLN
INTRAVENOUS | Status: DC
  Administered 2014-03-15: 75 mL/h via INTRAVENOUS

## 2014-03-15 MED ORDER — ENOXAPARIN SODIUM 40 MG/0.4ML ~~LOC~~ SOLN
40.0000 mg | SUBCUTANEOUS | Status: DC
  Administered 2014-03-15: 40 mg via SUBCUTANEOUS
  Filled 2014-03-15: qty 0.4

## 2014-03-15 MED ORDER — PRAVASTATIN SODIUM 10 MG PO TABS
20.0000 mg | ORAL_TABLET | Freq: Every day | ORAL | Status: DC
  Administered 2014-03-16: 20 mg via ORAL
  Filled 2014-03-15: qty 2

## 2014-03-15 MED ORDER — SODIUM CHLORIDE 0.9 % IJ SOLN
3.0000 mL | Freq: Two times a day (BID) | INTRAMUSCULAR | Status: DC
  Administered 2014-03-15 – 2014-03-16 (×2): 3 mL via INTRAVENOUS

## 2014-03-15 MED ORDER — ONDANSETRON HCL 4 MG PO TABS: 4.0000 mg | ORAL_TABLET | Freq: Four times a day (QID) | ORAL | Status: DC | PRN

## 2014-03-15 MED ORDER — DEXTROSE 5 % IV SOLN
INTRAVENOUS | Status: AC
  Filled 2014-03-15: qty 10

## 2014-03-15 NOTE — ED Notes (Signed)
Pt complain of pain in his neck. States he was looking out the window and everything was a white blur. States he does have a history of a irregular heartbeat but denies pain.

## 2014-03-15 NOTE — ED Notes (Signed)
Pt sitting up on side of bed per request, states he feels better like before incident this morning, denies any dizziness or other symptoms

## 2014-03-15 NOTE — ED Notes (Signed)
Pt states he was eating breakfast when he stood up he felt a pain across his upper back and in his neck pt states its was very strong but dull. Pt states he came slightly nauseated. Pt States back has improved and he is no longer nauseated. Pt in NAD. Pt reports fatigue. Pt denies any chest pain or SHOB at this time

## 2014-03-15 NOTE — ED Notes (Signed)
Dr.Ray at bedside for exam

## 2014-03-15 NOTE — ED Provider Notes (Signed)
CSN: [id number]     Arrival date & time 03/15/14  1030 History  This chart was scribed for Shaune Pollack, MD by Jeanell Sparrow, ED Scribe. This patient was seen in room [facility]16A/[facility]A and the patient's care was started at 10:40 AM.   Chief Complaint  Patient presents with  . Fatigue   Patient is a 68 y.o. male presenting with musculoskeletal pain. The history is provided by the patient. No language interpreter was used.  Muscle Pain This is a new problem. The current episode started 3 to 5 hours ago. The problem occurs rarely. The problem has been resolved. Pertinent negatives include no chest pain and no shortness of breath. Nothing aggravates the symptoms. Nothing relieves the symptoms. He has tried nothing for the symptoms.   HPI Comments: Jonathan Wise is a 68 y.o. male who presents to the Emergency Department complaining of constant moderate neck pain that started about 2 hours ago. He reports that the pain started after eating. He states that the pain was so bad that he couldn't hold his neck up and he had blurry vision at the time. He reports that the pain and blurry vision are gone now. He describes the pain as a dull sensation at the base of the head. He reports no prior hx of neck pain. He reports that he recently lost 70 lbs unintentionally. He states that he has a hx of DM, AFib, and HTN. He denies any use of blood thinners. He also denies any fever, chest pain, or SOB.   Past Medical History  Diagnosis Date  . Type 2 diabetes mellitus   . Hypertension   . Bilateral lower extremity edema   . Venous stasis ulcers   . Atrial fibrillation     On Pradaxa  . Hyperlipidemia   . Morbid obesity   . DVT (deep venous thrombosis) 1994    LLE. Completed tx with coumadin  . Kidney stones   . Urinary retention 10/15/2011  . Pulmonary artery hypertension 10/18/2011   Past Surgical History  Procedure Laterality Date  . Hernia repair      Umbilical  . Cystoscopy  10/18/2011    Procedure:  CYSTOSCOPY FLEXIBLE;  Surgeon: Marissa Nestle, MD;  Location: [facility];  Service: Urology;  Laterality: N/A;   Family History  Problem Relation Age of Onset  . Diabetes Mother   . Emphysema Father    History  Substance Use Topics  . Smoking status: Never Smoker   . Smokeless tobacco: Not on file  . Alcohol Use: No    Review of Systems  Constitutional: Positive for unexpected weight change. Negative for fever and chills.  Eyes: Positive for visual disturbance.  Respiratory: Negative for shortness of breath.   Cardiovascular: Negative for chest pain.  Musculoskeletal: Positive for neck pain.  All other systems reviewed and are negative.   Allergies  Furosemide  Home Medications   Prior to Admission medications   Medication Sig Start Date End Date Taking? Authorizing Provider  amoxicillin-clavulanate (AUGMENTIN) 500-125 MG per tablet Take 1 tablet (500 mg total) by mouth 2 (two) times daily. Take antibiotics for 7 more days. 06/24/12   Rexene Alberts, MD  aspirin 325 MG tablet Take 81.25 mg by mouth daily. Takes one-quarter tablet (81.25 mg) daily    Historical Provider, MD  bd getting started take home kit MISC 1 kit by Other route once. 06/24/12   Rexene Alberts, MD  dabigatran (PRADAXA) 150 MG CAPS Take 150 mg by mouth  every 12 (twelve) hours.    Historical Provider, MD  digoxin (LANOXIN) 0.125 MG tablet Take 1 tablet (0.125 mg total) by mouth daily. Medication for your heart rate. 10/18/11 10/17/12  Rexene Alberts, MD  diltiazem (TIAZAC) 360 MG 24 hr capsule Take 360 mg by mouth daily.    Historical Provider, MD  Flaxseed, Linseed, (FLAX SEED OIL) 1000 MG CAPS Take 1-4 capsules by mouth daily.     Historical Provider, MD  glipiZIDE (GLUCOTROL XL) 10 MG 24 hr tablet Take 10 mg by mouth 2 (two) times daily.    Historical Provider, MD  insulin detemir (LEVEMIR) 100 UNIT/ML injection Inject 20 Units into the skin at bedtime. 06/24/12   Rexene Alberts, MD  linagliptin (TRADJENTA) 5 MG TABS  tablet Take 5 mg by mouth daily.    Historical Provider, MD  metFORMIN (GLUCOPHAGE) 500 MG tablet Take 500 mg by mouth 2 (two) times daily.    Historical Provider, MD  pravastatin (PRAVACHOL) 20 MG tablet Take 20 mg by mouth daily.    Historical Provider, MD   BP 85/54 mmHg  Pulse 48  Temp(Src) 97.5 F (36.4 C) (Oral)  Resp 20  SpO2 99% Physical Exam  Constitutional: He is oriented to person, place, and time. He appears well-developed.  Morbidly obese  HENT:  Head: Normocephalic and atraumatic.  Right Ear: External ear normal.  Left Ear: External ear normal.  Nose: Nose normal.  Mouth/Throat: Oropharynx is clear and moist.  Eyes: Conjunctivae and EOM are normal.  Neck: Normal range of motion. Neck supple. No JVD present. No tracheal deviation present. No thyromegaly present.  Cardiovascular: An irregularly irregular rhythm present.  Pulmonary/Chest: Effort normal and breath sounds normal.  Abdominal: Soft. Bowel sounds are normal.  Musculoskeletal: Normal range of motion.  Bilateral peripheral venous stasis, dressing in place  Lymphadenopathy:    He has no cervical adenopathy.  Neurological: He is alert and oriented to person, place, and time. He has normal reflexes. He displays normal reflexes. No cranial nerve deficit. He exhibits normal muscle tone. Coordination normal.  Skin: Skin is warm.  Psychiatric: He has a normal mood and affect.  Nursing note and vitals reviewed.   ED Course  Procedures (including critical care time) DIAGNOSTIC STUDIES: Oxygen Saturation is 99% on RA, normal by my interpretation.    COORDINATION OF CARE: 10:44 AM- Pt advised of plan for treatment which includes IV fluids, and radiology and pt agrees.  Labs Review Labs Reviewed  COMPREHENSIVE METABOLIC PANEL - Abnormal; Notable for the following:    Glucose, Bld 295 (*)    Albumin 3.3 (*)    GFR calc non Af Amer 64 (*)    GFR calc Af Amer 74 (*)    All other components within normal limits   CBC WITH DIFFERENTIAL - Abnormal; Notable for the following:    WBC 12.5 (*)    Hemoglobin 12.4 (*)    Neutrophils Relative % 86 (*)    Neutro Abs 10.8 (*)    Lymphocytes Relative 7 (*)    All other components within normal limits  URINALYSIS, ROUTINE W REFLEX MICROSCOPIC - Abnormal; Notable for the following:    Glucose, UA >1000 (*)    Protein, ur TRACE (*)    Leukocytes, UA SMALL (*)    All other components within normal limits  URINE MICROSCOPIC-ADD ON - Abnormal; Notable for the following:    Squamous Epithelial / LPF FEW (*)    Bacteria, UA FEW (*)    All  other components within normal limits  LACTIC ACID, PLASMA - Abnormal; Notable for the following:    Lactic Acid, Venous 2.3 (*)    All other components within normal limits  I-STAT CHEM 8, ED - Abnormal; Notable for the following:    Glucose, Bld 290 (*)    All other components within normal limits  CULTURE, BLOOD (ROUTINE X 2)  CULTURE, BLOOD (ROUTINE X 2)  PROTIME-INR  DIGOXIN LEVEL    Imaging Review No results found.   EKG Interpretation None      Date: 03/15/2014  Rate: 48  Rhythm: atrial fibrillation  QRS Axis: normal  Intervals: normal  ST/T Wave abnormalities: normal  Conduction Disutrbances:afib with slow ventricular rate  Narrative Interpretation:   Old EKG Reviewed: none available   MDM   Final diagnoses:  Weak  Hypotension, unspecified hypotension type   Patient with episode of neck discomfort and weakness- I think likely what he describes is severe weakness due to hypotension.  .  He has a known a fib and is on lanoxin and pradaxa.  Patient being given iv fluids and atropine.  Pacer to bedside.  Labs pending.  Patient is morbidly obese with chronic venous stasis but exam ow normal except bradycardia.  Awaiting labs and dig level.    2:02 PM  Lab results normal except for uti.  Patient with hypotension and bradycardia responsive to atropine and iv fluids.  HR increased to 55 and bp 114/54  now.  Discussed with Dr. Jerilee Hoh and plan admission to telemetry bed. patient aware of plan and voices understanding   I personally performed the services described in this documentation, which was scribed in my presence. The recorded information has been reviewed and considered.   Shaune Pollack, MD 03/15/14 [phone]

## 2014-03-15 NOTE — H&P (Signed)
[facility]          History and Physical    PCP:   Woody Seller, MD   Chief Complaint:  Neck pain, "couldn't hold my head up, felt dizzy", blurry vision  HPI: Patient is a 68 year old man with complex medical history including diabetes, hypertension, hyperlipidemia, chronic lower extremity ulcers, morbid obesity who presents to the hospital today with the above-mentioned complaints. Patient states this morning right after he had finished eating breakfast and taken all his medications, he stood up and all of a sudden felt very dizzy and lightheaded, developed posterior neck pain and felt like he couldn't hold his head up. He looked about his back window and everything was blurry. His wife assisted him to sit down and they called EMS. By the time EMS arrived his symptoms had resolved. Upon arrival to the emergency department he was found to have a blood pressure with a systolic in the 42A and a heart rate of 37. This responded quickly to a bolus of IV fluids and some atropine. He is also found to have a urinary tract infection and lactic acidosis and we have been asked to admit him for further evaluation and management.  Allergies:   Allergies  Allergen Reactions  . Furosemide Other (See Comments)    Dizziness , drowsiness      Past Medical History  Diagnosis Date  . Type 2 diabetes mellitus   . Hypertension   . Bilateral lower extremity edema   . Venous stasis ulcers   . Atrial fibrillation     On Pradaxa  . Hyperlipidemia   . Morbid obesity   . DVT (deep venous thrombosis) 1994    LLE. Completed tx with coumadin  . Kidney stones   . Urinary retention 10/15/2011  . Pulmonary artery hypertension 10/18/2011    Past Surgical History  Procedure Laterality Date  . Hernia repair      Umbilical  . Cystoscopy  10/18/2011    Procedure: CYSTOSCOPY FLEXIBLE;  Surgeon: Marissa Nestle, MD;  Location: [facility];  Service: Urology;  Laterality: N/A;    Prior  to Admission medications   Medication Sig Start Date End Date Taking? Authorizing Provider  aspirin EC 81 MG tablet Take 81 mg by mouth daily.   Yes Historical Provider, MD  canagliflozin (INVOKANA) 100 MG TABS tablet Take 100 mg by mouth daily.   Yes Historical Provider, MD  digoxin (LANOXIN) 0.125 MG tablet Take 1 tablet (0.125 mg total) by mouth daily. Medication for your heart rate. 10/18/11 03/15/14 Yes Rexene Alberts, MD  Flaxseed, Linseed, (FLAX SEED OIL) 1000 MG CAPS Take 1-4 capsules by mouth daily.    Yes Historical Provider, MD  insulin detemir (LEVEMIR) 100 UNIT/ML injection Inject 20 Units into the skin at bedtime. Patient taking differently: Inject 30 Units into the skin daily. Injects around 1700 hours 06/24/12  Yes Rexene Alberts, MD  lisinopril (PRINIVIL,ZESTRIL) 5 MG tablet Take 5 mg by mouth daily.   Yes Historical Provider, MD  metFORMIN (GLUCOPHAGE) 500 MG tablet Take 500-1,000 mg by mouth 2 (two) times daily. Takes 500 mg in the morning and takes 1000 mg in the evening   Yes Historical Provider, MD  pioglitazone (ACTOS) 45 MG tablet Take 45 mg by mouth daily.   Yes Historical Provider, MD  pravastatin (PRAVACHOL) 20 MG tablet Take 20 mg by mouth daily.   Yes Historical Provider, MD  verapamil (  CALAN) 120 MG tablet Take 360 mg by mouth daily. 12/08/13  Yes Historical Provider, MD  amoxicillin-clavulanate (AUGMENTIN) 500-125 MG per tablet Take 1 tablet (500 mg total) by mouth 2 (two) times daily. Take antibiotics for 7 more days. Patient not taking: Reported on 03/15/2014 06/24/12   Rexene Alberts, MD  bd getting started take home kit MISC 1 kit by Other route once. 06/24/12   Rexene Alberts, MD    Social History:  reports that he has never smoked. He does not have any smokeless tobacco history on file. He reports that he does not drink alcohol. His drug history is not on file.  Family History  Problem Relation Age of Onset  . Diabetes Mother   . Emphysema Father     Review of  Systems:  Constitutional: Denies fever, chills, diaphoresis, appetite change and fatigue.  HEENT: Denies photophobia, eye pain, redness, hearing loss, ear pain, congestion, sore throat, rhinorrhea, sneezing, mouth sores, trouble swallowing, neck pain, neck stiffness and tinnitus.   Respiratory: Denies SOB, DOE, cough, chest tightness,  and wheezing.   Cardiovascular: Denies chest pain, palpitations and leg swelling.  Gastrointestinal: Denies nausea, vomiting, abdominal pain, diarrhea, constipation, blood in stool and abdominal distention.  Genitourinary: Denies dysuria, urgency, frequency, hematuria, flank pain and difficulty urinating.  Endocrine: Denies: hot or cold intolerance, sweats, changes in hair or nails, polyuria, polydipsia. Musculoskeletal: Denies myalgias, back pain, joint swelling, arthralgias and gait problem.  Skin: Denies pallor, rash and wound.  Neurological: Denies dizziness, seizures, syncope, weakness, light-headedness, numbness and headaches.  Hematological: Denies adenopathy. Easy bruising, personal or family bleeding history  Psychiatric/Behavioral: Denies suicidal ideation, mood changes, confusion, nervousness, sleep disturbance and agitation   Physical Exam: Blood pressure 128/62, pulse 55, temperature 97.5 F (36.4 C), temperature source Oral, resp. rate 14, SpO2 97 %. General: Alert, awake, oriented 3, no current distress, morbidly obese. HEENT: Normocephalic, atraumatic, pupils equal round and reactive to light, extra movements intact, wears corrective lenses, dry mucous membranes with cracked lips and tongue, poor dentition, no pharyngeal erythema. Cardiovascular: Bradycardic, regular rhythm, no murmurs, rubs or gallops. Lungs: Clear to auscultation bilaterally. Abdomen: Obese, soft, nontender, nondistended, positive bowel sounds.  Extremities: Bilateral Unna boots in place, has not allowed me to remove them. Neurologic: Grossly intact and nonfocal.  Labs on  Admission:  Results for orders placed or performed during the hospital encounter of 03/15/14 (from the past 48 hour(s))  Comprehensive metabolic panel     Status: Abnormal   Collection Time: 03/15/14 11:08 AM  Result Value Ref Range   Sodium 139 137 - 147 mEq/L   Potassium 4.8 3.7 - 5.3 mEq/L   Chloride 103 96 - 112 mEq/L   CO2 22 19 - 32 mEq/L   Glucose, Bld 295 (H) 70 - 99 mg/dL   BUN 23 6 - 23 mg/dL   Creatinine, Ser 1.14 0.50 - 1.35 mg/dL   Calcium 8.7 8.4 - 10.5 mg/dL   Total Protein 6.9 6.0 - 8.3 g/dL   Albumin 3.3 (L) 3.5 - 5.2 g/dL   AST 24 0 - 37 U/L   ALT 18 0 - 53 U/L   Alkaline Phosphatase 71 39 - 117 U/L   Total Bilirubin 0.7 0.3 - 1.2 mg/dL   GFR calc non Af Amer 64 (L) >90 mL/min   GFR calc Af Amer 74 (L) >90 mL/min    Comment: (NOTE) The eGFR has been calculated using the CKD EPI equation. This calculation has not been validated in all  clinical situations. eGFR's persistently <90 mL/min signify possible Chronic Kidney Disease.    Anion gap 14 5 - 15  CBC with Differential     Status: Abnormal   Collection Time: 03/15/14 11:08 AM  Result Value Ref Range   WBC 12.5 (H) 4.0 - 10.5 K/uL   RBC 4.54 4.22 - 5.81 MIL/uL   Hemoglobin 12.4 (L) 13.0 - 17.0 g/dL   HCT 40.0 39.0 - 52.0 %   MCV 88.1 78.0 - 100.0 fL   MCH 27.3 26.0 - 34.0 pg   MCHC 31.0 30.0 - 36.0 g/dL   RDW 14.8 11.5 - 15.5 %   Platelets 262 150 - 400 K/uL   Neutrophils Relative % 86 (H) 43 - 77 %   Neutro Abs 10.8 (H) 1.7 - 7.7 K/uL   Lymphocytes Relative 7 (L) 12 - 46 %   Lymphs Abs 0.9 0.7 - 4.0 K/uL   Monocytes Relative 6 3 - 12 %   Monocytes Absolute 0.7 0.1 - 1.0 K/uL   Eosinophils Relative 1 0 - 5 %   Eosinophils Absolute 0.1 0.0 - 0.7 K/uL   Basophils Relative 0 0 - 1 %   Basophils Absolute 0.0 0.0 - 0.1 K/uL  Protime-INR     Status: None   Collection Time: 03/15/14 11:08 AM  Result Value Ref Range   Prothrombin Time 14.5 11.6 - 15.2 seconds   INR 1.12 0.00 - 1.49  Digoxin level      Status: None   Collection Time: 03/15/14 11:08 AM  Result Value Ref Range   Digoxin Level 0.8 0.8 - 2.0 ng/mL  Lactic acid, plasma     Status: Abnormal   Collection Time: 03/15/14 11:08 AM  Result Value Ref Range   Lactic Acid, Venous 2.3 (H) 0.5 - 2.2 mmol/L  I-stat chem 8, ed     Status: Abnormal   Collection Time: 03/15/14 11:22 AM  Result Value Ref Range   Sodium 140 137 - 147 mEq/L   Potassium 4.6 3.7 - 5.3 mEq/L   Chloride 104 96 - 112 mEq/L   BUN 22 6 - 23 mg/dL   Creatinine, Ser 1.10 0.50 - 1.35 mg/dL   Glucose, Bld 290 (H) 70 - 99 mg/dL   Calcium, Ion 1.17 1.13 - 1.30 mmol/L   TCO2 24 0 - 100 mmol/L   Hemoglobin 14.6 13.0 - 17.0 g/dL   HCT 43.0 39.0 - 52.0 %  Urinalysis, Routine w reflex microscopic     Status: Abnormal   Collection Time: 03/15/14 12:54 PM  Result Value Ref Range   Color, Urine YELLOW YELLOW   APPearance CLEAR CLEAR   Specific Gravity, Urine 1.020 1.005 - 1.030   pH 5.0 5.0 - 8.0   Glucose, UA >1000 (A) NEGATIVE mg/dL   Hgb urine dipstick NEGATIVE NEGATIVE   Bilirubin Urine NEGATIVE NEGATIVE   Ketones, ur NEGATIVE NEGATIVE mg/dL   Protein, ur TRACE (A) NEGATIVE mg/dL   Urobilinogen, UA 0.2 0.0 - 1.0 mg/dL   Nitrite NEGATIVE NEGATIVE   Leukocytes, UA SMALL (A) NEGATIVE  Urine microscopic-add on     Status: Abnormal   Collection Time: 03/15/14 12:54 PM  Result Value Ref Range   Squamous Epithelial / LPF FEW (A) RARE   WBC, UA 21-50 <3 WBC/hpf   Bacteria, UA FEW (A) RARE    Radiological Exams on Admission: Dg Chest Port 1 View  03/15/2014   CLINICAL DATA:  Weakness, upper back pain  EXAM: PORTABLE CHEST - 1 VIEW  COMPARISON:  06/22/2012  FINDINGS: Cardiomegaly is reidentified. The lungs are clear. No pleural effusion. No acute osseous finding.  IMPRESSION: Cardiomegaly without focal acute finding.   Electronically Signed   By: Conchita Paris M.D.   On: 03/15/2014 11:58    Assessment/Plan Principal Problem:   Bradycardia Active Problems:    Hypotension   Type 2 diabetes mellitus   Morbid obesity   UTI (urinary tract infection)   Leukocytosis   Lactic acidosis    Hypotension/bradycardia  -Suspect this to be the etiology of his presenting complaints. -His digoxin level is within range. -We'll decrease verapamil dose from 360 to 240 mg once a day.  -Monitor overnight on telemetry.  Type 2 diabetes  -Check hemoglobin A1c. -Continue home dose of Levemir and metformin, start sliding scale insulin.  UTI -Continue Rocephin pending culture data.  Leukocytosis -Likely related to UTI.  Lactic acidosis -Likely related to hypotension and poor circulating volume.  -Recheck lactic acid levels in the morning.  Morbid obesity  DVT prophylaxis -Lovenox  CODE STATUS -Full code as discussed with patient and wife [place] at bedside.   Time Spent on Admission: 75 minutes  HERNANDEZ ACOSTA,ESTELA [facility] Pager: [phone] 03/15/2014, 4:09 PM

## 2014-03-15 NOTE — Progress Notes (Signed)
Discussed changing his unna boots.  He stated that he did not like to use what we used here last time on his legs, and that he used a specific kind that he orders at home.  I asked him to have his wife to bring in what he  Uses at home on them and he stated that he may just change them at home since he would probably get out before lunch tomorrow.

## 2014-03-15 NOTE — Plan of Care (Signed)
Problem: Consults Goal: General Medical Patient Education See Patient Education Module for specific education. Outcome: Progressing Talked about bradycardia

## 2014-03-16 DIAGNOSIS — I959 Hypotension, unspecified: Secondary | ICD-10-CM | POA: Diagnosis not present

## 2014-03-16 LAB — BASIC METABOLIC PANEL
Anion gap: 12 (ref 5–15)
BUN: 20 mg/dL (ref 6–23)
CALCIUM: 9.1 mg/dL (ref 8.4–10.5)
CHLORIDE: 105 meq/L (ref 96–112)
CO2: 25 meq/L (ref 19–32)
CREATININE: 0.85 mg/dL (ref 0.50–1.35)
GFR calc Af Amer: >90 mL/min (ref >90)
GFR calc non Af Amer: 88 mL/min — ABNORMAL LOW (ref >90)
GLUCOSE: 117 mg/dL — AB (ref 70–99)
Potassium: 4.4 mEq/L (ref 3.7–5.3)
Sodium: 142 mEq/L (ref 137–147)

## 2014-03-16 LAB — CBC
HCT: 40.7 % (ref 39.0–52.0)
HEMOGLOBIN: 12.7 g/dL — AB (ref 13.0–17.0)
MCH: 27.5 pg (ref 26.0–34.0)
MCHC: 31.2 g/dL (ref 30.0–36.0)
MCV: 88.3 fL (ref 78.0–100.0)
Platelets: 269 10*3/uL (ref 150–400)
RBC: 4.61 MIL/uL (ref 4.22–5.81)
RDW: 14.8 % (ref 11.5–15.5)
WBC: 9.1 10*3/uL (ref 4.0–10.5)

## 2014-03-16 LAB — HEMOGLOBIN A1C
HEMOGLOBIN A1C: 7.7 % — AB (ref <5.7)
Mean Plasma Glucose: 174 mg/dL — ABNORMAL HIGH (ref <117)

## 2014-03-16 LAB — URINE CULTURE

## 2014-03-16 LAB — GLUCOSE, CAPILLARY
Glucose-Capillary: 120 mg/dL — ABNORMAL HIGH (ref 70–99)
Glucose-Capillary: 119 mg/dL — ABNORMAL HIGH (ref 70–99)

## 2014-03-16 LAB — LACTIC ACID, PLASMA: Lactic Acid, Venous: 0.8 mmol/L (ref 0.5–2.2)

## 2014-03-16 MED ORDER — METFORMIN HCL 500 MG PO TABS: 1000.0000 mg | ORAL_TABLET | Freq: Every day | ORAL | Status: DC

## 2014-03-16 MED ORDER — METFORMIN HCL 500 MG PO TABS
500.0000 mg | ORAL_TABLET | Freq: Every day | ORAL | Status: DC
  Administered 2014-03-16: 500 mg via ORAL
  Filled 2014-03-16: qty 1

## 2014-03-16 MED ORDER — CIPROFLOXACIN HCL 250 MG PO TABS: 250.0000 mg | ORAL_TABLET | Freq: Two times a day (BID) | ORAL | Status: DC

## 2014-03-16 MED ORDER — VERAPAMIL HCL 120 MG PO TABS: 240.0000 mg | ORAL_TABLET | Freq: Every day | ORAL | Status: DC

## 2014-03-16 NOTE — Care Management Note (Signed)
    Page 1 of 1   03/16/2014     11:24:17 AM CARE MANAGEMENT NOTE 03/16/2014  Patient:  Ferd HibbsBARHAM,Christopher L   Account Number:  [id number]  Date Initiated:  03/16/2014  Documentation initiated by:  Sharrie RothmanBLACKWELL,Kam Kushnir C  Subjective/Objective Assessment:   Pt admitted from home with bradycardia and UTi. Pt lives with his wife and will return home at discharge. Pt has a cane and walker for home use. Pt is fairly independent with ADL's and does his own unaboot wraps to legs.     Action/Plan:   Pt for discharge home today. No CM needs noted.   Anticipated DC Date:  03/16/2014   Anticipated DC Plan:  HOME/SELF CARE      DC Planning Services  CM consult      Choice offered to / List presented to:             Status of service:  Completed, signed off Medicare Important Message given?   (If response is "NO", the following Medicare IM given date fields will be blank) Date Medicare IM given:   Medicare IM given by:   Date Additional Medicare IM given:   Additional Medicare IM given by:    Discharge Disposition:  HOME/SELF CARE  Per UR Regulation:    If discussed at Long Length of Stay Meetings, dates discussed:    Comments:  03/16/14 1120 Arlyss Queenammy Quanta Roher, RN BSN CM

## 2014-03-16 NOTE — Progress Notes (Signed)
Nutrition Brief Note  Patient identified on the Malnutrition Screening Tool (MST) Report  Wt Readings from Last 15 Encounters:  03/15/14 317 lb 12.8 oz (144.153 kg)  06/23/12 345 lb 14.4 oz (156.9 kg)  10/15/11 375 lb 9.6 oz (170.371 kg)    Body mass index is 41.94 kg/(m^2). Patient meets criteria for obesity class III based on current BMI. Desirable weight over past 2 years.  Appetite is very good. Current diet order is Low Sodium/CHO Modified, patient is consuming approximately 100% of meals at this time. Labs and medications reviewed.   No nutrition interventions warranted at this time. If nutrition issues arise, please consult RD.   Royann ShiversLynn Shailee Foots MS,RD,CSG,LDN Office: [phone] Pager: [phone]

## 2014-03-16 NOTE — Progress Notes (Signed)
Patient discharged with instructions, prescription, and care notes.  Verbalized understanding via teach back.  IV was removed and the site was WNL. Patient voiced no further complaints or concerns at the time of discharge.  Appointments scheduled per instructions.  Patient left the floor via w/c with staff and family in stable condition.  Prior to discharge the MD was made aware of both BP and no new orders were given.  Both were above SBP of 100.  The patient was stable and worried about not being able to go home.  I voiced to him that I had discussed the situation with the MD and agrees that he is okay to be discharged.

## 2014-03-16 NOTE — Discharge Summary (Signed)
Physician Discharge Summary  Jonathan Wise:[id number] DOB: 02-Jan-1946 DOA: 03/15/2014  PCP: Woody Seller, MD  Admit date: 03/15/2014 Discharge date: 03/16/2014  Time spent: 45 minutes  Recommendations for Outpatient Follow-up:  -Will be discharged home today. -Advised to follow up with PCP and cardiology in 2 weeks.   Discharge Diagnoses:  Principal Problem:   Bradycardia Active Problems:   Hypotension   Type 2 diabetes mellitus   Morbid obesity   UTI (urinary tract infection)   Leukocytosis   Lactic acidosis   Discharge Condition: Stable and improved  Filed Weights   03/15/14 1700  Weight: 144.153 kg (317 lb 12.8 oz)    History of present illness:  Patient is a 68 year old man with complex medical history including diabetes, hypertension, hyperlipidemia, chronic lower extremity ulcers, morbid obesity who presents to the hospital today with the above-mentioned complaints. Patient states this morning right after he had finished eating breakfast and taken all his medications, he stood up and all of a sudden felt very dizzy and lightheaded, developed posterior neck pain and felt like he couldn't hold his head up. He looked about his back window and everything was blurry. His wife assisted him to sit down and they called EMS. By the time EMS arrived his symptoms had resolved. Upon arrival to the emergency department he was found to have a blood pressure with a systolic in the 83J and a heart rate of 37. This responded quickly to a bolus of IV fluids and some atropine. He is also found to have a urinary tract infection and lactic acidosis and we have been asked to admit him for further evaluation and management.  Hospital Course:   Hypotension/bradycardia -Resolved with decrease of verapamil from 360 to 240 mg daily.  -Advised to follow-up with PCP in 2 weeks for continued blood pressure monitoring.  Type 2 diabetes -Well-Controlled. -Continue Outpatient  Regimen.  UTI -Culture is pending. -Cipro twice a day for 3 days.  Lactic acidosis -Likely related to hypotension and poor circulating volume. -Resolved.  Procedures:  None   Consultations:  None  Discharge Instructions  Discharge Instructions    Diet - low sodium heart healthy    Complete by:  As directed      Increase activity slowly    Complete by:  As directed             Medication List    STOP taking these medications        amoxicillin-clavulanate 500-125 MG per tablet  Commonly known as:  AUGMENTIN      TAKE these medications        aspirin EC 81 MG tablet  Take 81 mg by mouth daily.     bd getting started take home kit Misc  1 kit by Other route once.     ciprofloxacin 250 MG tablet  Commonly known as:  CIPRO  Take 1 tablet (250 mg total) by mouth 2 (two) times daily.     digoxin 0.125 MG tablet  Commonly known as:  LANOXIN  Take 1 tablet (0.125 mg total) by mouth daily. Medication for your heart rate.     Flax Seed Oil 1000 MG Caps  Take 1-4 capsules by mouth daily.     insulin detemir 100 UNIT/ML injection  Commonly known as:  LEVEMIR  Inject 20 Units into the skin at bedtime.     INVOKANA 100 MG Tabs tablet  Generic drug:  canagliflozin  Take 100 mg by mouth daily.  lisinopril 5 MG tablet  Commonly known as:  PRINIVIL,ZESTRIL  Take 5 mg by mouth daily.     metFORMIN 500 MG tablet  Commonly known as:  GLUCOPHAGE  Take 500-1,000 mg by mouth 2 (two) times daily. Takes 500 mg in the morning and takes 1000 mg in the evening     pioglitazone 45 MG tablet  Commonly known as:  ACTOS  Take 45 mg by mouth daily.     pravastatin 20 MG tablet  Commonly known as:  PRAVACHOL  Take 20 mg by mouth daily.     verapamil 120 MG tablet  Commonly known as:  CALAN  Take 2 tablets (240 mg total) by mouth daily.       Allergies  Allergen Reactions  . Furosemide Other (See Comments)    Dizziness , drowsiness       Follow-up  Information    Follow up with Woody Seller, MD. Schedule an appointment as soon as possible for a visit in 2 weeks.   Specialty:  Family Medicine   Contact information:   [address] [place] [address] [phone]       Follow up with Jory Sims, NP On 03/30/2014.   Specialty:  Nurse Practitioner   Why:  04/01/2014   Contact information:   [place] [facility] [place] [address] [phone]       Follow up with Woody Seller, MD On 04/01/2014.   Specialty:  Family Medicine   Why:  be there at 11:00   Contact information:   [address] [place] [address]        The results of significant diagnostics from this hospitalization (including imaging, microbiology, ancillary and laboratory) are listed below for reference.    Significant Diagnostic Studies: Dg Chest Port 1 View  03/15/2014   CLINICAL DATA:  Weakness, upper back pain  EXAM: PORTABLE CHEST - 1 VIEW  COMPARISON:  06/22/2012  FINDINGS: Cardiomegaly is reidentified. The lungs are clear. No pleural effusion. No acute osseous finding.  IMPRESSION: Cardiomegaly without focal acute finding.   Electronically Signed   By: Conchita Paris M.D.   On: 03/15/2014 11:58    Microbiology: Recent Results (from the past 240 hour(s))  Culture, blood (routine x 2)     Status: None (Preliminary result)   Collection Time: 03/15/14 11:08 AM  Result Value Ref Range Status   Specimen Description BLOOD LEFT HAND  Final   Special Requests BOTTLES DRAWN AEROBIC AND ANAEROBIC 10CC  Final   Culture NO GROWTH 1 DAY  Final   Report Status PENDING  Incomplete  Culture, blood (routine x 2)     Status: None (Preliminary result)   Collection Time: 03/15/14 11:17 AM  Result Value Ref Range Status   Specimen Description BLOOD LEFT HAND  Final   Special Requests BOTTLES DRAWN AEROBIC AND ANAEROBIC 10CC  Final   Culture NO GROWTH 1 DAY  Final   Report Status PENDING  Incomplete     Labs: Basic Metabolic  Panel:  Recent Labs Lab 03/15/14 1108 03/15/14 1122 03/16/14 0632  NA 139 140 142  K 4.8 4.6 4.4  CL 103 104 105  CO2 22  --  25  GLUCOSE 295* 290* 117*  BUN 23 22 20   CREATININE 1.14 1.10 0.85  CALCIUM 8.7  --  9.1   Liver Function Tests:  Recent Labs Lab 03/15/14 1108  AST 24  ALT 18  ALKPHOS 71  BILITOT 0.7  PROT  6.9  ALBUMIN 3.3*   No results for input(s): LIPASE, AMYLASE in the last 168 hours. No results for input(s): AMMONIA in the last 168 hours. CBC:  Recent Labs Lab 03/15/14 1108 03/15/14 1122 03/16/14 0632  WBC 12.5*  --  9.1  NEUTROABS 10.8*  --   --   HGB 12.4* 14.6 12.7*  HCT 40.0 43.0 40.7  MCV 88.1  --  88.3  PLT 262  --  269   Cardiac Enzymes: No results for input(s): CKTOTAL, CKMB, CKMBINDEX, TROPONINI in the last 168 hours. BNP: BNP (last 3 results) No results for input(s): PROBNP in the last 8760 hours. CBG:  Recent Labs Lab 03/15/14 1637 03/15/14 2039 03/16/14 0821 03/16/14 1124  GLUCAP 126* 124* 120* 119*       Signed:  [place]  [facility] Pager: [phone] 03/16/2014, 3:41 PM

## 2014-03-16 NOTE — Progress Notes (Signed)
UR chart review completed.  

## 2014-03-22 LAB — CULTURE, BLOOD (ROUTINE X 2)
CULTURE: NO GROWTH
Culture: NO GROWTH

## 2014-03-29 NOTE — Progress Notes (Signed)
HPI: [name] is a 68 y/o patient to be established with Dr. Bronson Ing or Dr. Harl Bowie. We are following for ongoing assessment and management of bradycardia, atrial fib. The patient was recently admitted to [facility] in the setting of dizziness after standing up, with lightheadedness and posterior neck pain. The patient was found to be hypotensive with a heart rate of 37 bpm. He was treated with IV fluids and atropine. He was found to have a urinary tract infection and lactic acidosis. The patient was treated with Cipro, verapamil was decreased from  360 mg to 240 mg daily. He is here for post hospitalization follow-up for ongoing assessment, and need for cardiac monitor.  He comes today without complaint. No further chest pain or dizziness. He has been out of Pradaxa for over one month. In "doughnut hole." He is able to afford his other medications through [employer].      Allergies  Allergen Reactions  . Furosemide Other (See Comments)    Dizziness , drowsiness    Current Outpatient Prescriptions  Medication Sig Dispense Refill  . aspirin EC 81 MG tablet Take 81 mg by mouth daily.    . bd getting started take home kit MISC 1 kit by Other route once.    . canagliflozin (INVOKANA) 100 MG TABS tablet Take 100 mg by mouth daily.    . dabigatran (PRADAXA) 150 MG CAPS capsule Take 150 mg by mouth 2 (two) times daily.    . digoxin (LANOXIN) 0.125 MG tablet Take 1 tablet (0.125 mg total) by mouth daily. Medication for your heart rate. 30 tablet 2  . Flaxseed, Linseed, (FLAX SEED OIL) 1000 MG CAPS Take 1-4 capsules by mouth daily.     . insulin detemir (LEVEMIR) 100 UNIT/ML injection Inject 20 Units into the skin at bedtime. (Patient taking differently: Inject 30 Units into the skin daily. Injects around 1700 hours) 10 mL 12  . lisinopril (PRINIVIL,ZESTRIL) 5 MG tablet Take 5 mg by mouth daily.    . metFORMIN (GLUCOPHAGE) 500 MG tablet Take 500-1,000 mg by mouth 2 (two) times daily.  Takes 500 mg in the morning and takes 1000 mg in the evening    . pioglitazone (ACTOS) 45 MG tablet Take 45 mg by mouth daily.    . pravastatin (PRAVACHOL) 20 MG tablet Take 20 mg by mouth daily.    . verapamil (CALAN) 120 MG tablet Take 2 tablets (240 mg total) by mouth daily.     No current facility-administered medications for this visit.    Past Medical History  Diagnosis Date  . Type 2 diabetes mellitus   . Hypertension   . Bilateral lower extremity edema   . Venous stasis ulcers   . Atrial fibrillation     On Pradaxa  . Hyperlipidemia   . Morbid obesity   . DVT (deep venous thrombosis) 1994    LLE. Completed tx with coumadin  . Kidney stones   . Urinary retention 10/15/2011  . Pulmonary artery hypertension 10/18/2011    Past Surgical History  Procedure Laterality Date  . Hernia repair      Umbilical  . Cystoscopy  10/18/2011    Procedure: CYSTOSCOPY FLEXIBLE;  Surgeon: Marissa Nestle, MD;  Location: [facility];  Service: Urology;  Laterality: N/A;    ROS: Complete review of systems performed and found to be negative unless outlined above  PHYSICAL EXAM BP 134/70 mmHg  Pulse 115  Ht _0  (1.88 m)  Wt 310  lb (140.615 kg)  BMI 39.78 kg/m2   General: Well developed, well nourished, in no acute distress Head: Eyes PERRLA, No xanthomas.   Normal cephalic and atramatic  Lungs: Clear bilaterally to auscultation and percussion. Heart: HRIR S1 S2, without MRG.  Pulses are 2+ & equal.            No carotid bruit. No JVD.  No abdominal bruits. No femoral bruits. Abdomen: Bowel sounds are positive, abdomen soft and non-tender without masses or                  Hernia's noted. Msk:  Back normal, normal gait. Normal strength and tone for age. Extremities: No clubbing, cyanosis or edema. Una boot bilaterally. DP +1 Neuro: Alert and oriented X 3. Psych:  Good affect, responds appropriately   EKG: Atrial fib rate of 88 bpm.  ASSESSMENT AND PLAN

## 2014-03-30 ENCOUNTER — Encounter: Payer: Self-pay | Admitting: Adult Health

## 2014-03-30 ENCOUNTER — Ambulatory Visit (INDEPENDENT_AMBULATORY_CARE_PROVIDER_SITE_OTHER): Payer: Medicare Other | Admitting: Adult Health

## 2014-03-30 VITALS — BP 134/70 | HR 115 | Ht 74.0 in | Wt 310.0 lb

## 2014-03-30 DIAGNOSIS — I4819 Other persistent atrial fibrillation: Secondary | ICD-10-CM

## 2014-03-30 DIAGNOSIS — R001 Bradycardia, unspecified: Secondary | ICD-10-CM

## 2014-03-30 DIAGNOSIS — I481 Persistent atrial fibrillation: Secondary | ICD-10-CM

## 2014-03-30 NOTE — Progress Notes (Deleted)
Name: Jonathan Wise    DOB: 19-Jun-1945  Age: 68 y.o.  MR#: [id number]       PCP:  Woody Seller, MD      Insurance: Payor: [employer] MEDICARE / Plan: AARP MEDICARE COMPLETE / Product Type: *No Product type* /   CC:    Chief Complaint  Patient presents with  . Bradycardia    VS Filed Vitals:   03/30/14 1244  BP: 134/70  Pulse: 115  Height: 6' 2" (1.88 m)  Weight: 310 lb (140.615 kg)    Weights Current Weight  03/30/14 310 lb (140.615 kg)  03/15/14 317 lb 12.8 oz (144.153 kg)  06/23/12 345 lb 14.4 oz (156.9 kg)    Blood Pressure  BP Readings from Last 3 Encounters:  03/30/14 134/70  03/16/14 115/63  06/24/12 120/60     Admit date:  (Not on file) Last encounter with RMR:  Visit date not found   Allergy Furosemide  Current Outpatient Prescriptions  Medication Sig Dispense Refill  . aspirin EC 81 MG tablet Take 81 mg by mouth daily.    . bd getting started take home kit MISC 1 kit by Other route once.    . canagliflozin (INVOKANA) 100 MG TABS tablet Take 100 mg by mouth daily.    . digoxin (LANOXIN) 0.125 MG tablet Take 1 tablet (0.125 mg total) by mouth daily. Medication for your heart rate. 30 tablet 2  . Flaxseed, Linseed, (FLAX SEED OIL) 1000 MG CAPS Take 1-4 capsules by mouth daily.     . insulin detemir (LEVEMIR) 100 UNIT/ML injection Inject 20 Units into the skin at bedtime. (Patient taking differently: Inject 30 Units into the skin daily. Injects around 1700 hours) 10 mL 12  . lisinopril (PRINIVIL,ZESTRIL) 5 MG tablet Take 5 mg by mouth daily.    . metFORMIN (GLUCOPHAGE) 500 MG tablet Take 500-1,000 mg by mouth 2 (two) times daily. Takes 500 mg in the morning and takes 1000 mg in the evening    . pioglitazone (ACTOS) 45 MG tablet Take 45 mg by mouth daily.    . pravastatin (PRAVACHOL) 20 MG tablet Take 20 mg by mouth daily.    . verapamil (CALAN) 120 MG tablet Take 2 tablets (240 mg total) by mouth daily.     No current facility-administered  medications for this visit.    Discontinued Meds:    Medications Discontinued During This Encounter  Medication Reason  . ciprofloxacin (CIPRO) 250 MG tablet Error    Patient Active Problem List   Diagnosis Date Noted  . Bradycardia 03/15/2014  . UTI (urinary tract infection) 03/15/2014  . Leukocytosis 03/15/2014  . Lactic acidosis 03/15/2014  . Atrial fibrillation 06/23/2012  . PVC's (premature ventricular contractions) 06/23/2012  . Cellulitis of leg, left 06/22/2012  . SIRS (systemic inflammatory response syndrome) 06/22/2012  . Ketonuria 06/22/2012  . Morbid obesity 06/22/2012  . Osteoarthritis of right knee 06/22/2012  . Current use of long term anticoagulation 06/22/2012  . Pulmonary artery hypertension 10/18/2011  . Metabolic acidosis 89/21/1941  . Urinary retention 10/15/2011  . Cellulitis of left leg 10/14/2011  . Bilateral lower extremity edema 10/14/2011  . Hypotension 10/14/2011  . Hypovolemia 10/14/2011  . Acute renal failure 10/14/2011  . Venous stasis ulcers 10/14/2011  . Hyponatremia 10/14/2011  . Atrial fibrillation with rapid ventricular response 10/14/2011  . Nausea 10/14/2011  . Type 2 diabetes mellitus 10/14/2011    LABS    Component Value Date/Time   NA 142 03/16/2014 7408  NA 140 03/15/2014 1122   NA 139 03/15/2014 1108   K 4.4 03/16/2014 0632   K 4.6 03/15/2014 1122   K 4.8 03/15/2014 1108   CL 105 03/16/2014 0632   CL 104 03/15/2014 1122   CL 103 03/15/2014 1108   CO2 25 03/16/2014 0632   CO2 22 03/15/2014 1108   CO2 24 06/24/2012 0436   GLUCOSE 117* 03/16/2014 0632   GLUCOSE 290* 03/15/2014 1122   GLUCOSE 295* 03/15/2014 1108   BUN 20 03/16/2014 0632   BUN 22 03/15/2014 1122   BUN 23 03/15/2014 1108   CREATININE 0.85 03/16/2014 0632   CREATININE 1.10 03/15/2014 1122   CREATININE 1.14 03/15/2014 1108   CALCIUM 9.1 03/16/2014 0632   CALCIUM 8.7 03/15/2014 1108   CALCIUM 8.2* 06/24/2012 0436   GFRNONAA 88* 03/16/2014 0632    GFRNONAA 64* 03/15/2014 1108   GFRNONAA 88* 06/24/2012 0436   GFRAA >90 03/16/2014 0632   GFRAA 74* 03/15/2014 1108   GFRAA >90 06/24/2012 0436   CMP     Component Value Date/Time   NA 142 03/16/2014 0632   K 4.4 03/16/2014 0632   CL 105 03/16/2014 0632   CO2 25 03/16/2014 0632   GLUCOSE 117* 03/16/2014 0632   BUN 20 03/16/2014 0632   CREATININE 0.85 03/16/2014 0632   CALCIUM 9.1 03/16/2014 0632   PROT 6.9 03/15/2014 1108   ALBUMIN 3.3* 03/15/2014 1108   AST 24 03/15/2014 1108   ALT 18 03/15/2014 1108   ALKPHOS 71 03/15/2014 1108   BILITOT 0.7 03/15/2014 1108   GFRNONAA 88* 03/16/2014 0632   GFRAA >90 03/16/2014 0632       Component Value Date/Time   WBC 9.1 03/16/2014 0632   WBC 12.5* 03/15/2014 1108   WBC 9.4 06/24/2012 0436   HGB 12.7* 03/16/2014 0632   HGB 14.6 03/15/2014 1122   HGB 12.4* 03/15/2014 1108   HCT 40.7 03/16/2014 0632   HCT 43.0 03/15/2014 1122   HCT 40.0 03/15/2014 1108   MCV 88.3 03/16/2014 0632   MCV 88.1 03/15/2014 1108   MCV 89.8 06/24/2012 0436    Lipid Panel  No results found for: CHOL, TRIG, HDL, CHOLHDL, VLDL, LDLCALC, LDLDIRECT  ABG    Component Value Date/Time   HCO3 24.6* 06/22/2012 1415   TCO2 24 03/15/2014 1122   ACIDBASEDEF 1.5 06/22/2012 1415   O2SAT 88.0 06/22/2012 1415     Lab Results  Component Value Date   TSH 0.553 06/23/2012   BNP (last 3 results) No results for input(s): PROBNP in the last 8760 hours. Cardiac Panel (last 3 results) No results for input(s): CKTOTAL, CKMB, TROPONINI, RELINDX in the last 72 hours.  Iron/TIBC/Ferritin/ %Sat No results found for: IRON, TIBC, FERRITIN, IRONPCTSAT   EKG Orders placed or performed during the hospital encounter of 03/15/14  . EKG     Prior Assessment and Plan Problem List as of 03/30/2014      Cardiovascular and Mediastinum   Atrial fibrillation   Hypotension   Atrial fibrillation with rapid ventricular response   Pulmonary artery hypertension   PVC's  (premature ventricular contractions)     Endocrine   Type 2 diabetes mellitus     Musculoskeletal and Integument   Venous stasis ulcers   Osteoarthritis of right knee     Genitourinary   Acute renal failure   Urinary retention   UTI (urinary tract infection)     Other   Cellulitis of left leg   Bilateral lower extremity edema  Hypovolemia   Hyponatremia   Nausea   Metabolic acidosis   Cellulitis of leg, left   SIRS (systemic inflammatory response syndrome)   Ketonuria   Morbid obesity   Current use of long term anticoagulation   Bradycardia   Leukocytosis   Lactic acidosis       Imaging: Dg Chest Port 1 View  03/15/2014   CLINICAL DATA:  Weakness, upper back pain  EXAM: PORTABLE CHEST - 1 VIEW  COMPARISON:  06/22/2012  FINDINGS: Cardiomegaly is reidentified. The lungs are clear. No pleural effusion. No acute osseous finding.  IMPRESSION: Cardiomegaly without focal acute finding.   Electronically Signed   By: Conchita Paris M.D.   On: 03/15/2014 11:58

## 2014-03-30 NOTE — Patient Instructions (Signed)
Your physician recommends that you schedule a follow-up appointment in: 2 months  Your physician has recommended you make the following change in your medication:   Start Pradaxa 150mg  Twice a day   You were given samples of Pradaxa in the office  Thank you for choosing [facility]!

## 2014-03-30 NOTE — Assessment & Plan Note (Signed)
Currently stable. No other changes in medications at this time.

## 2014-03-30 NOTE — Assessment & Plan Note (Signed)
He continues essentially rate controlled on verapamil and digoxin. He has been out of Pradaxa for over a month. He is dependent on samples until he is "out of doughnut hole" . I have given him 6 weeks of samples to assist him until he is back on the plan to get his medications. He will need to come back in 2 months to see Dr. Purvis SheffieldKoneswaran or Dr. Wyline MoodBranch to be established with our local cardiologist. He has seen Dr. Jacinto HalimGanji in the past but wants to establish locally. Reviewed last labs with normal creatinine.

## 2014-03-30 NOTE — Assessment & Plan Note (Addendum)
HR is much better now with adjustment in verapamil dose.

## 2014-05-26 ENCOUNTER — Telehealth: Payer: Self-pay | Admitting: *Deleted

## 2014-05-26 NOTE — Telephone Encounter (Signed)
Pt needs pradaxa samples at next office visit. Scheduled next Monday/tmj

## 2014-05-26 NOTE — Telephone Encounter (Signed)
Noted  

## 2014-05-31 ENCOUNTER — Emergency Department (HOSPITAL_COMMUNITY)
Admission: EM | Admit: 2014-05-31 | Discharge: 2014-05-31 | Disposition: A | Discharge status: Home / Self Care | Payer: Medicare Other | Attending: Emergency Medicine | Admitting: Emergency Medicine

## 2014-05-31 ENCOUNTER — Encounter (HOSPITAL_COMMUNITY): Payer: Self-pay

## 2014-05-31 DIAGNOSIS — Z7902 Long term (current) use of antithrombotics/antiplatelets: Secondary | ICD-10-CM | POA: Diagnosis not present

## 2014-05-31 DIAGNOSIS — E119 Type 2 diabetes mellitus without complications: Secondary | ICD-10-CM | POA: Insufficient documentation

## 2014-05-31 DIAGNOSIS — E785 Hyperlipidemia, unspecified: Secondary | ICD-10-CM | POA: Diagnosis not present

## 2014-05-31 DIAGNOSIS — Z86718 Personal history of other venous thrombosis and embolism: Secondary | ICD-10-CM | POA: Diagnosis not present

## 2014-05-31 DIAGNOSIS — IMO0001 Reserved for inherently not codable concepts without codable children: Secondary | ICD-10-CM

## 2014-05-31 DIAGNOSIS — Z79899 Other long term (current) drug therapy: Secondary | ICD-10-CM | POA: Diagnosis not present

## 2014-05-31 DIAGNOSIS — Z7982 Long term (current) use of aspirin: Secondary | ICD-10-CM | POA: Diagnosis not present

## 2014-05-31 DIAGNOSIS — Z87442 Personal history of urinary calculi: Secondary | ICD-10-CM | POA: Diagnosis not present

## 2014-05-31 DIAGNOSIS — L97919 Non-pressure chronic ulcer of unspecified part of right lower leg with unspecified severity: Secondary | ICD-10-CM | POA: Diagnosis not present

## 2014-05-31 DIAGNOSIS — Z794 Long term (current) use of insulin: Secondary | ICD-10-CM | POA: Insufficient documentation

## 2014-05-31 DIAGNOSIS — I10 Essential (primary) hypertension: Secondary | ICD-10-CM | POA: Diagnosis not present

## 2014-05-31 DIAGNOSIS — I4891 Unspecified atrial fibrillation: Secondary | ICD-10-CM | POA: Diagnosis not present

## 2014-05-31 DIAGNOSIS — M79604 Pain in right leg: Secondary | ICD-10-CM | POA: Insufficient documentation

## 2014-05-31 DIAGNOSIS — M79605 Pain in left leg: Secondary | ICD-10-CM | POA: Diagnosis present

## 2014-05-31 DIAGNOSIS — I83028 Varicose veins of left lower extremity with ulcer other part of lower leg: Secondary | ICD-10-CM | POA: Insufficient documentation

## 2014-05-31 MED ORDER — DOXYCYCLINE HYCLATE 100 MG PO CAPS: 100.0000 mg | ORAL_CAPSULE | Freq: Two times a day (BID) | ORAL | Status: DC

## 2014-05-31 NOTE — ED Notes (Signed)
No bleeding from legs at this time.

## 2014-05-31 NOTE — ED Notes (Addendum)
Bilateral lower legs rewrapped with patients supplies.  Silvadene, telfa and pt kling wrap used.  No bleeding .

## 2014-05-31 NOTE — ED Provider Notes (Signed)
CSN: [id number]     Arrival date & time 05/31/14  1413 History   This chart was scribed for Orpah Greek, * by Dellis Filbert, ED Scribe. The patient was seen in [facility] and the patient's care was started at 2:20 PM.  Chief Complaint  Patient presents with  . Leg Pain   Patient is a 69 y.o. male presenting with leg pain. The history is provided by the patient. No language interpreter was used.  Leg Pain Location:  Ankle, leg and foot Leg location:  L lower leg and R lower leg Ankle location:  L ankle and R ankle Pain details:    Quality:  Unable to specify   Radiates to:  Does not radiate   Severity:  Mild   Timing:  Constant Dislocation: no   Associated symptoms: swelling     HPI Comments: Jonathan Wise is a 69 y.o. male who presents to the Emergency Department complaining of leg pain Unwrapped his legs and was getting ready to shower and he noticed his right leg was shooting blood out of his right legs. He wraps and treats his legs on his own. Will only go to [facility] when needed. Takes pradaxa 250 mg every day.  Past Medical History  Diagnosis Date  . Type 2 diabetes mellitus   . Hypertension   . Bilateral lower extremity edema   . Venous stasis ulcers   . Atrial fibrillation     On Pradaxa  . Hyperlipidemia   . Morbid obesity   . DVT (deep venous thrombosis) 1994    LLE. Completed tx with coumadin  . Kidney stones   . Urinary retention 10/15/2011  . Pulmonary artery hypertension 10/18/2011   Past Surgical History  Procedure Laterality Date  . Hernia repair      Umbilical  . Cystoscopy  10/18/2011    Procedure: CYSTOSCOPY FLEXIBLE;  Surgeon: Marissa Nestle, MD;  Location: [facility];  Service: Urology;  Laterality: N/A;   Family History  Problem Relation Age of Onset  . Diabetes Mother   . Emphysema Father    History  Substance Use Topics  . Smoking status: Never Smoker   . Smokeless tobacco: Not on file  . Alcohol Use: No     Review of Systems  Cardiovascular: Positive for leg swelling.  All other systems reviewed and are negative.     Allergies  Furosemide  Home Medications   Prior to Admission medications   Medication Sig Start Date End Date Taking? Authorizing Provider  aspirin EC 81 MG tablet Take 81 mg by mouth daily.    Historical Provider, MD  bd getting started take home kit MISC 1 kit by Other route once. 06/24/12   Rexene Alberts, MD  canagliflozin (INVOKANA) 100 MG TABS tablet Take 100 mg by mouth daily.    Historical Provider, MD  dabigatran (PRADAXA) 150 MG CAPS capsule Take 150 mg by mouth 2 (two) times daily.    Historical Provider, MD  digoxin (LANOXIN) 0.125 MG tablet Take 1 tablet (0.125 mg total) by mouth daily. Medication for your heart rate. 10/18/11 03/30/14  Rexene Alberts, MD  Flaxseed, Linseed, (FLAX SEED OIL) 1000 MG CAPS Take 1-4 capsules by mouth daily.     Historical Provider, MD  insulin detemir (LEVEMIR) 100 UNIT/ML injection Inject 20 Units into the skin at bedtime. Patient taking differently: Inject 30 Units into the skin daily. Injects around 1700 hours 06/24/12   Rexene Alberts, MD  lisinopril (  PRINIVIL,ZESTRIL) 5 MG tablet Take 5 mg by mouth daily.    Historical Provider, MD  metFORMIN (GLUCOPHAGE) 500 MG tablet Take 500-1,000 mg by mouth 2 (two) times daily. Takes 500 mg in the morning and takes 1000 mg in the evening    Historical Provider, MD  pioglitazone (ACTOS) 45 MG tablet Take 45 mg by mouth daily.    Historical Provider, MD  pravastatin (PRAVACHOL) 20 MG tablet Take 20 mg by mouth daily.    Historical Provider, MD  verapamil (CALAN) 120 MG tablet Take 2 tablets (240 mg total) by mouth daily. 03/16/14   Erline Hau, MD   There were no vitals taken for this visit. Physical Exam  Constitutional: He is oriented to person, place, and time. He appears well-developed and well-nourished. No distress.  HENT:  Head: Normocephalic and atraumatic.  Right Ear:  Hearing normal.  Left Ear: Hearing normal.  Nose: Nose normal.  Mouth/Throat: Oropharynx is clear and moist and mucous membranes are normal.  Eyes: Conjunctivae and EOM are normal. Pupils are equal, round, and reactive to light.  Neck: Normal range of motion. Neck supple.  Cardiovascular: Regular rhythm, S1 normal and S2 normal.  Exam reveals no gallop and no friction rub.   No murmur heard. Pulmonary/Chest: Effort normal and breath sounds normal. No respiratory distress. He exhibits no tenderness.  Abdominal: Soft. Normal appearance and bowel sounds are normal. There is no hepatosplenomegaly. There is no tenderness. There is no rebound, no guarding, no tenderness at McBurney's point and negative Murphy's sign. No hernia.  Musculoskeletal: Normal range of motion. He exhibits edema.  Neurological: He is alert and oriented to person, place, and time. He has normal strength. No cranial nerve deficit or sensory deficit. Coordination normal. GCS eye subscore is 4. GCS verbal subscore is 5. GCS motor subscore is 6.  Skin: Skin is warm, dry and intact. No rash noted. No cyanosis.     Psychiatric: He has a normal mood and affect. His speech is normal and behavior is normal. Thought content normal.  Nursing note and vitals reviewed.   ED Course  Procedures  DIAGNOSTIC STUDIES:    COORDINATION OF CARE: 2:27 PM Discussed treatment plan with pt at bedside and pt agreed to plan.   Labs Review Labs Reviewed - No data to display  Imaging Review No results found.   EKG Interpretation None      MDM   Final diagnoses:  None   venous stasis ulcer   Patient presents to the ER for evaluation of bleeding from his right lower leg. Patient reports that he has a history of chronic lower extremity edema and ulceration. He generally wraps his legs because of this. He was unwrapped, getting into the shower when he noticed bleeding from his right lower leg. He reports a stream of blood coming out of  the upper portion of the right lower leg. He wrapped it with Coban and and the bleeding has stopped upon arrival. The area was unwrapped and he does have multiple superficial ulcerations, one of which appears to be the source of bleeding. The area was rewrapped and he was observed, no further bleeding. Cannot rule out infection, but most of the changes are likely chronic. Will empirically cover with doxycycline, recommend follow-up with wound care center where he has been before. Follow-up with primary care doctor soon as possible. Return if symptoms worsen.  I personally performed the services described in this documentation, which was scribed in my presence. The  recorded information has been reviewed and is accurate.       Orpah Greek, MD 05/31/14 [phone]

## 2014-05-31 NOTE — ED Notes (Signed)
EMS reports pt has poor circulation in legs with redness and swelling.  Pt wraps legs routinely.  Reports was getting in the shower today and legs started bleeding.  Reports legs "ache."

## 2014-05-31 NOTE — Discharge Instructions (Signed)
Stasis Ulcer Stasis ulcers occur in the legs when the circulation is damaged. An ulcer may look like a small hole in the skin.  CAUSES Stasis ulcers occur because your veins do not work properly. Veins have valves that help the blood return to the heart. If these valves do not work right, blood flows backwards and backs up into the veins near the skin. This condition causes the veins to become larger because of increased pressure and may lead to a stasis ulcer. SYMPTOMS   Shallow (superficial) sore on the leg.  Clear drainage or weeping from the sore.  Leg pain or a feeling of heaviness. This may be worse at the end of the day.  Leg swelling.  Skin color changes. DIAGNOSIS  Your caregiver will make a diagnosis by examining your leg. Your caregiver may order tests such as an ultrasound or other studies to evaluate the blood flow of the leg. HOME CARE INSTRUCTIONS   Do not stand or sit in one position for long periods of time. Do not sit with your legs crossed. Rest with your legs raised during the day. If possible, it is best if you can elevate your legs above your heart for 30 minutes, 3 to 4 times a day.  Wear elastic stockings or support hose. Do not wear other tight encircling garments around legs, pelvis, or waist. This causes increased pressure in your veins. If your caregiver has applied compressive medicated wraps, use them as instructed.  Walk as much as possible to increase blood flow. If you are taking long rides in a car or plane, take a break to walk around every 2 hours. If not already on aspirin, take a baby aspirin before long trips unless you have medical reasons that prohibit this.  Raise the foot of your bed at night with 2-inch blocks if approved by your caregiver. This may not be desirable if you have heart failure or breathing problems.  If you get a cut in the skin over the vein and the vein bleeds, lie down with your leg raised and gently clean the area with a clean  cloth. Apply pressure on the cut until the bleeding stops. Then place a dressing on the cut. See your caregiver if it continues to bleed or needs stitches. Also, see your caregiver if you develop an infection.Signs of an infection include a fever, redness, increased pain, and drainage of pus.  If your caregiver has given you a follow-up appointment, it is very important to keep that appointment. Not keeping the appointment could result in a chronic or permanent injury, pain, and disability. If there is any problem keeping the appointment, call your caregiver for assistance. SEEK IMMEDIATE MEDICAL CARE IF:  The ulcer area starts to break down.  You have pain, redness, tenderness, pus, or hard swelling in your leg over a vein or near the ulcer.  Your leg pain is uncomfortable.  You develop an unexplained fever.  You develop chest pain or shortness of breath. Document Released: 12/20/2000 Document Revised: 06/19/2011 Document Reviewed: 07/17/2010 [facility] Patient Information 2015 [place], [place]. This information is not intended to replace advice given to you by your health care provider. Make sure you discuss any questions you have with your health care provider.  Bleeding Varicose Veins Varicose veins are veins that have become enlarged and twisted. Valves in the veins help return blood from the leg to the heart. If these valves are damaged, blood flows backwards and backs up into the veins in  the leg near the skin. This causes the veins to become larger because of increased pressure within. Sometimes these veins bleed. CAUSES  Factors that can lead to bleeding varicose veins include:  Thinning of the skin that covers the veins. This skin is stretched as the veins enlarge.  Weak and thinning walls of the varicose veins. These thin walls are part of the reason why blood is not flowing normally to the heart.  Having high pressure in the veins. This high pressure occurs because the blood is  not flowing freely back up to the heart.  Injury. Even a small injury to a varicose vein can cause bleeding.  Open wounds. A sore may develop near a varicose vein and not heal. This makes bleeding more likely.  Taking medicine that thins the blood. These medicines may include aspirin, anti-inflammatory medicine, and other blood thinners. SYMPTOMS  If bleeding is on the outside surface of the skin, blood can be seen. Sometimes, the bleeding stays under the skin. If this happens, the blue or purple area will spread beyond the vein. This discoloration may be visible. DIAGNOSIS  To decide if you have a bleeding varicose vein, your caregiver may:  Ask about your symptoms. This will include when you first saw bleeding.  Ask about how long you have had varicose veins and if they cause you problems.  Ask about your overall health.  Ask about possible causes, like recent cuts or if the area near the varicose veins was bumped or injured.  Examine the skin or leg that concerns you. Your caregiver will probably feel the veins.  Order imaging tests. These create detailed pictures of the veins. TREATMENT  The first goal of treating bleeding varicose veins is to stop the bleeding. Then, the aim is to keep any bleeding from happening again. Treatment will depend on the cause of the bleeding and how bad it is. Ask your caregiver about what would be best for you. Options include:  Raising (elevating) your leg. Lie down with your leg propped up on a pillow or cushion. Your foot should be above your heart.  Applying pressure to the spot that is bleeding. The bleeding should stop in a short time.  Wearing elastic stocking that "compress" your legs (compression stockings). An elastic bandage may do the same thing.  Applying an antibiotic cream on sores that are not healing.  Surgically removing or closing off the bleeding varicose veins. HOME CARE INSTRUCTIONS   Apply any creams that your caregiver  prescribed. Follow the directions carefully.  Wear compression stockings or any special wraps that were prescribed. Make sure you know:  If you should wear them every day.  How long you should wear them.  If veins were removed or closed, a bandage (dressing) will probably cover the area. Make sure you know:  How often the dressing should be changed.  Whether the area can get wet.  When you can leave the skin uncovered.  Check your skin every day. Look for new sores and signs of bleeding.  To prevent future bleeding:  Use extra care in situations where you could cut your legs. Shaving, for example, or working outside in the garden.  Try to keep your legs elevated as much as possible. Lie down when you can. SEEK MEDICAL CARE IF:   You have any questions about how to wear compression stockings or elastic bandages.  Your veins continue to bleed.  Sores develop near your varicose veins.  You have a sore  that does not heal or gets bigger.  Pain increases in your leg.  The area around a varicose vein becomes warm, red, or tender to the touch.  You notice a yellowish fluid that smells bad coming from a spot where there was bleeding.  You develop a fever of more than 100.5 F (38.1 C). SEEK IMMEDIATE MEDICAL CARE IF:   You develop a fever of more than 102 F (38.9 C). Document Released: 08/13/2008 Document Revised: 06/19/2011 Document Reviewed: 07/29/2013 [facility] Patient Information 2015 [place], [place]. This information is not intended to replace advice given to you by your health care provider. Make sure you discuss any questions you have with your health care provider.

## 2014-06-01 ENCOUNTER — Ambulatory Visit (INDEPENDENT_AMBULATORY_CARE_PROVIDER_SITE_OTHER): Payer: Medicare Other | Admitting: Cardiovascular Disease

## 2014-06-01 ENCOUNTER — Encounter: Payer: Self-pay | Admitting: Cardiovascular Disease

## 2014-06-01 VITALS — BP 126/68 | HR 115 | Ht 73.0 in | Wt 305.0 lb

## 2014-06-01 DIAGNOSIS — R0989 Other specified symptoms and signs involving the circulatory and respiratory systems: Secondary | ICD-10-CM

## 2014-06-01 DIAGNOSIS — I10 Essential (primary) hypertension: Secondary | ICD-10-CM

## 2014-06-01 DIAGNOSIS — I480 Paroxysmal atrial fibrillation: Secondary | ICD-10-CM

## 2014-06-01 DIAGNOSIS — R600 Localized edema: Secondary | ICD-10-CM

## 2014-06-01 DIAGNOSIS — R000 Tachycardia, unspecified: Secondary | ICD-10-CM

## 2014-06-01 DIAGNOSIS — Z136 Encounter for screening for cardiovascular disorders: Secondary | ICD-10-CM

## 2014-06-01 DIAGNOSIS — E785 Hyperlipidemia, unspecified: Secondary | ICD-10-CM

## 2014-06-01 MED ORDER — VERAPAMIL HCL 120 MG PO TABS: ORAL_TABLET | ORAL | Status: DC

## 2014-06-01 NOTE — Addendum Note (Signed)
Addended by: Kerney ElbePINNIX, Betty Brooks G on: 06/01/2014 10:18 AM   Modules accepted: Level of Service

## 2014-06-01 NOTE — Patient Instructions (Addendum)
Your physician recommends that you schedule a follow-up appointment in: 3-4 weeks with Dr. Purvis SheffieldKoneswaran  Your physician has recommended you make the following change in your medication:  Stop Lisinopril Increase Verapamil to 240 mg in the AM and 120 mg in the PM  Your physician has requested that you have a carotid duplex. This test is an ultrasound of the carotid arteries in your neck. It looks at blood flow through these arteries that supply the brain with blood. Allow one hour for this exam. There are no restrictions or special instructions.  You have been given samples of Pradaxa today   Thank you for choosing [facility]!

## 2014-06-01 NOTE — Progress Notes (Signed)
Patient ID: Jonathan Wise, male   DOB: Nov 22, 1945, 69 y.o.   MRN: [id number]      SUBJECTIVE: The patient is a 69 year old male with a past medical history significant for morbid obesity, atrial fibrillation, chronic lower extremity swelling, type 2 diabetes mellitus, hypertension, and hyperlipidemia. He is maintained on verapamil for rate control and dabigatran for anticoagulation. He currently denies chest pain, shortness of breath, and palpitations. He had been on a higher dose of verapamil in 2015 but due to hypotension it was reduced. He was evaluated in the ED for right leg bleeding yesterday. He was given an antibiotic which he has yet to begin.   He said he was evaluated by a cardiologist in [place] in the recent past, Dr. Einar Gip. He was initially told he needed cardiac catheterization for "a problem with the widow maker", but was then told the problem disappeared. I do not have these records at present.  ECG performed in the office today demonstrates sinus tachycardia, heart rate 118 bpm.  Review of Systems: As per "subjective", otherwise negative.  Allergies  Allergen Reactions  . Furosemide Other (See Comments)    Dizziness , drowsiness    Current Outpatient Prescriptions  Medication Sig Dispense Refill  . aspirin EC 81 MG tablet Take 81 mg by mouth daily.    . bd getting started take home kit MISC 1 kit by Other route once.    . canagliflozin (INVOKANA) 100 MG TABS tablet Take 100 mg by mouth daily.    . dabigatran (PRADAXA) 150 MG CAPS capsule Take 150 mg by mouth 2 (two) times daily.    Marland Kitchen doxycycline (VIBRAMYCIN) 100 MG capsule Take 1 capsule (100 mg total) by mouth 2 (two) times daily. 20 capsule 0  . Flaxseed, Linseed, (FLAX SEED OIL) 1000 MG CAPS Take 1-4 capsules by mouth daily.     . insulin detemir (LEVEMIR) 100 UNIT/ML injection Inject 20 Units into the skin at bedtime. (Patient taking differently: Inject 30 Units into the skin daily. Injects around 1700 hours) 10  mL 12  . lisinopril (PRINIVIL,ZESTRIL) 5 MG tablet Take 5 mg by mouth daily.    . metFORMIN (GLUCOPHAGE) 500 MG tablet Take 500-1,000 mg by mouth 2 (two) times daily. Takes 500 mg in the morning and takes 1000 mg in the evening    . pioglitazone (ACTOS) 45 MG tablet Take 45 mg by mouth daily.    . pravastatin (PRAVACHOL) 20 MG tablet Take 20 mg by mouth daily.    . verapamil (CALAN) 120 MG tablet Take 2 tablets (240 mg total) by mouth daily.    . digoxin (LANOXIN) 0.125 MG tablet Take 1 tablet (0.125 mg total) by mouth daily. Medication for your heart rate. 30 tablet 2   No current facility-administered medications for this visit.    Past Medical History  Diagnosis Date  . Type 2 diabetes mellitus   . Hypertension   . Bilateral lower extremity edema   . Venous stasis ulcers   . Atrial fibrillation     On Pradaxa  . Hyperlipidemia   . Morbid obesity   . DVT (deep venous thrombosis) 1994    LLE. Completed tx with coumadin  . Kidney stones   . Urinary retention 10/15/2011  . Pulmonary artery hypertension 10/18/2011    Past Surgical History  Procedure Laterality Date  . Hernia repair      Umbilical  . Cystoscopy  10/18/2011    Procedure: CYSTOSCOPY FLEXIBLE;  Surgeon: Marissa Nestle, MD;  Location: [facility];  Service: Urology;  Laterality: N/A;    History   Social History  . Marital Status: Married    Spouse Name: N/A  . Number of Children: N/A  . Years of Education: N/A   Occupational History  . Not on file.   Social History Main Topics  . Smoking status: Never Smoker   . Smokeless tobacco: Never Used  . Alcohol Use: No  . Drug Use: No  . Sexual Activity: Not on file   Other Topics Concern  . Not on file   Social History Narrative     Filed Vitals:   06/01/14 0913  BP: 126/68  Pulse: 115  Height: 6' 1"  (1.854 m)  Weight: 305 lb (138.347 kg)  SpO2: 94%    PHYSICAL EXAM General: NAD HEENT: Normal. Neck: Difficult to assess JVP due to body  habitus. Lungs: Clear to auscultation bilaterally with normal respiratory effort. CV: Tachycardic, regular rhythm, normal S1/S2, no S3/S4, no murmur. Legs are bandaged to knees. Right carotid bruit.   Abdomen: Morbidly obese, no obvious distention.  Neurologic: Alert and oriented x 3.  Psych: Normal affect. Musculoskeletal: No gross deformities. Extremities: No upper extremity clubbing or cyanosis.   ECG: Most recent ECG reviewed.      ASSESSMENT AND PLAN: 1. Atrial fibrillation: Currently in sinus tachycardia with no obvious precipitating factors (febrile illness, anemia). I will increase verapamil to 240 mg in the morning and 120 mg in the evening. I will discontinue lisinopril so as to avoid precipitating hypotension. I will continue digoxin 0.125 mg daily for the time being. I will continue Pradaxa 150 mg twice daily for anticoagulation. I have also provided him with samples.  2. Essential hypertension: Well controlled. I am discontinuing lisinopril and increasing verapamil.  3. Right carotid bruit: Will obtain carotid Dopplers.  4. Chronic lower extremity edema: Not on a diuretic. Previously followed with vascular surgery. Would recommend follow up given his comorbidities putting him at risk for the development of peripheral vascular disease.  5. Hyperlipidemia: On pravastatin 20 mg. Will obtain copy of lipids.  Dispo: f/u 1 month.  Time spent: 40 minutes, of which >50% spent reviewing current findings and management strategies.  Kate Sable, M.D., F.A.C.C.

## 2014-06-05 ENCOUNTER — Encounter: Payer: Self-pay | Admitting: Cardiovascular Disease

## 2014-06-08 ENCOUNTER — Ambulatory Visit (HOSPITAL_COMMUNITY)
Admission: RE | Admit: 2014-06-08 | Discharge: 2014-06-08 | Disposition: A | Discharge status: Home / Self Care | Payer: Medicare Other | Source: Ambulatory Visit | Attending: Cardiovascular Disease | Admitting: Cardiovascular Disease

## 2014-06-08 ENCOUNTER — Other Ambulatory Visit (HOSPITAL_COMMUNITY): Payer: Medicare Other

## 2014-06-08 DIAGNOSIS — Z720 Tobacco use: Secondary | ICD-10-CM | POA: Insufficient documentation

## 2014-06-08 DIAGNOSIS — R0989 Other specified symptoms and signs involving the circulatory and respiratory systems: Secondary | ICD-10-CM | POA: Diagnosis not present

## 2014-06-08 DIAGNOSIS — E119 Type 2 diabetes mellitus without complications: Secondary | ICD-10-CM | POA: Insufficient documentation

## 2014-06-08 DIAGNOSIS — I10 Essential (primary) hypertension: Secondary | ICD-10-CM | POA: Insufficient documentation

## 2014-06-08 DIAGNOSIS — E785 Hyperlipidemia, unspecified: Secondary | ICD-10-CM | POA: Diagnosis not present

## 2014-06-09 ENCOUNTER — Other Ambulatory Visit: Payer: Self-pay | Admitting: *Deleted

## 2014-06-09 ENCOUNTER — Telehealth: Payer: Self-pay | Admitting: Cardiovascular Disease

## 2014-06-09 DIAGNOSIS — I6523 Occlusion and stenosis of bilateral carotid arteries: Secondary | ICD-10-CM

## 2014-06-09 NOTE — Telephone Encounter (Signed)
Spoke with patient regarding CT. States that his insurance co pay is around $300 but he will have it done. All questions answered. Patient voiced understanding.

## 2014-06-12 ENCOUNTER — Telehealth: Payer: Self-pay | Admitting: *Deleted

## 2014-06-12 DIAGNOSIS — Z01818 Encounter for other preprocedural examination: Secondary | ICD-10-CM

## 2014-06-12 NOTE — Telephone Encounter (Signed)
-----   Message from Ashley Akinerry G Stewart sent at 06/12/2014  9:05 AM EST ----- Regarding: FW: Please order CTA Patient scheduled for Wednesday March 9th. Register @ 10:00am @ Radiology Department.  Please advise patient of this appointment and that he needs lab work (BMP) prior to.   Thanks, Aurther Lofterry  ----- Message -----    From: San Jettyanya M Jackson    Sent: 06/09/2014   3:25 PM      To: Ashley Akinerry G Stewart Subject: FW: Please order CTA                             ----- Message -----    From: Kerney ElbeLukisha G Chizara Mena, LPN    Sent: 1/4/78293/04/2014   8:17 AM      To: San Jettyanya M Jackson Subject: Please order CTA                               Notes Recorded by Laqueta LindenSuresh A Koneswaran, MD on 06/08/2014 at 1:41 PM Please order CTA as recommended by radiology for further delineation of intracranial stenosis (vertebral vs basilar), with possible right sided occlusion. Carotid stenosis is not significant.   Details

## 2014-06-12 NOTE — Telephone Encounter (Signed)
Patient notified. Patient states that he may not do CT due to the price. All questions answered. Patient voiced understanding.

## 2014-06-17 ENCOUNTER — Ambulatory Visit (HOSPITAL_COMMUNITY): Payer: Medicare Other

## 2014-06-23 ENCOUNTER — Ambulatory Visit: Payer: Medicare Other | Admitting: Cardiovascular Disease

## 2014-06-24 ENCOUNTER — Ambulatory Visit (INDEPENDENT_AMBULATORY_CARE_PROVIDER_SITE_OTHER): Payer: Medicare Other | Admitting: Cardiovascular Disease

## 2014-06-24 ENCOUNTER — Encounter: Payer: Self-pay | Admitting: Cardiovascular Disease

## 2014-06-24 VITALS — BP 108/76 | HR 54 | Ht 73.0 in | Wt 310.0 lb

## 2014-06-24 DIAGNOSIS — I679 Cerebrovascular disease, unspecified: Secondary | ICD-10-CM

## 2014-06-24 DIAGNOSIS — E785 Hyperlipidemia, unspecified: Secondary | ICD-10-CM

## 2014-06-24 DIAGNOSIS — I480 Paroxysmal atrial fibrillation: Secondary | ICD-10-CM

## 2014-06-24 DIAGNOSIS — Z5181 Encounter for therapeutic drug level monitoring: Secondary | ICD-10-CM

## 2014-06-24 DIAGNOSIS — I739 Peripheral vascular disease, unspecified: Secondary | ICD-10-CM

## 2014-06-24 DIAGNOSIS — R000 Tachycardia, unspecified: Secondary | ICD-10-CM

## 2014-06-24 DIAGNOSIS — I10 Essential (primary) hypertension: Secondary | ICD-10-CM

## 2014-06-24 DIAGNOSIS — Z7901 Long term (current) use of anticoagulants: Secondary | ICD-10-CM

## 2014-06-24 LAB — LIPID PANEL
CHOLESTEROL: 144 mg/dL (ref 0–200)
HDL: 35 mg/dL — AB (ref >=40)
LDL Cholesterol: 81 mg/dL (ref 0–99)
Total CHOL/HDL Ratio: 4.1 Ratio
Triglycerides: 140 mg/dL (ref <150)
VLDL: 28 mg/dL (ref 0–40)

## 2014-06-24 NOTE — Addendum Note (Signed)
Addended by: Kerney ElbePINNIX, LUKISHA G on: 06/24/2014 09:14 AM   Modules accepted: Orders

## 2014-06-24 NOTE — Progress Notes (Signed)
Patient ID: Jonathan Wise, male   DOB: 31-Oct-1945, 69 y.o.   MRN: [id number]      SUBJECTIVE: The patient presents for follow up of atrial fibrillation. He denies palpitations, chest pain, dizziness, lightheadedness, and syncope. His primary complaint relates to the heaviness in both of his legs and he is scheduled to see a vascular physician next month. He has questions about potentially coming off Pradaxa and switching to warfarin due to cost.   Review of Systems: As per "subjective", otherwise negative.  Allergies  Allergen Reactions  . Furosemide Other (See Comments)    Dizziness , drowsiness    Current Outpatient Prescriptions  Medication Sig Dispense Refill  . aspirin EC 81 MG tablet Take 81 mg by mouth daily.    . bd getting started take home kit MISC 1 kit by Other route once.    . canagliflozin (INVOKANA) 100 MG TABS tablet Take 100 mg by mouth daily.    . dabigatran (PRADAXA) 150 MG CAPS capsule Take 150 mg by mouth 2 (two) times daily.    Marland Kitchen doxycycline (VIBRAMYCIN) 100 MG capsule Take 1 capsule (100 mg total) by mouth 2 (two) times daily. 20 capsule 0  . Flaxseed, Linseed, (FLAX SEED OIL) 1000 MG CAPS Take 1-4 capsules by mouth daily.     . insulin detemir (LEVEMIR) 100 UNIT/ML injection Inject 20 Units into the skin at bedtime. (Patient taking differently: Inject 30 Units into the skin daily. Injects around 1700 hours) 10 mL 12  . metFORMIN (GLUCOPHAGE) 500 MG tablet Take 500-1,000 mg by mouth 2 (two) times daily. Takes 500 mg in the morning and takes 1000 mg in the evening    . pioglitazone (ACTOS) 45 MG tablet Take 45 mg by mouth daily.    . pravastatin (PRAVACHOL) 20 MG tablet Take 20 mg by mouth daily.    . verapamil (CALAN) 120 MG tablet Take 240 mg every AM and 120 mg every PM 90 tablet 3  . digoxin (LANOXIN) 0.125 MG tablet Take 1 tablet (0.125 mg total) by mouth daily. Medication for your heart rate. 30 tablet 2   No current facility-administered medications for  this visit.    Past Medical History  Diagnosis Date  . Type 2 diabetes mellitus   . Hypertension   . Bilateral lower extremity edema   . Venous stasis ulcers   . Atrial fibrillation     On Pradaxa  . Hyperlipidemia   . Morbid obesity   . DVT (deep venous thrombosis) 1994    LLE. Completed tx with coumadin  . Kidney stones   . Urinary retention 10/15/2011  . Pulmonary artery hypertension 10/18/2011    Past Surgical History  Procedure Laterality Date  . Hernia repair      Umbilical  . Cystoscopy  10/18/2011    Procedure: CYSTOSCOPY FLEXIBLE;  Surgeon: Marissa Nestle, MD;  Location: [facility];  Service: Urology;  Laterality: N/A;    History   Social History  . Marital Status: Married    Spouse Name: N/A  . Number of Children: N/A  . Years of Education: N/A   Occupational History  . Not on file.   Social History Main Topics  . Smoking status: Never Smoker   . Smokeless tobacco: Never Used  . Alcohol Use: No  . Drug Use: No  . Sexual Activity: Not on file   Other Topics Concern  . Not on file   Social History Narrative     Filed Vitals:  06/24/14 0810  BP: 108/76  Pulse: 54  Height: 6' 1"  (1.854 m)  Weight: 310 lb (140.615 kg)  SpO2: 98%    PHYSICAL EXAM General: NAD, poor dentition. HEENT: Normal. Neck: Difficult to assess JVP due to body habitus. Lungs: Clear to auscultation bilaterally with normal respiratory effort. CV: Irregular rhythm, normal rate, normal S1/S2, no S3, no murmur. Legs are bandaged to knees. Right carotid bruit.  Abdomen: Morbidly obese, no obvious distention.  Neurologic: Alert and oriented x 3.  Psych: Normal affect. Musculoskeletal: No gross deformities. Extremities: No upper extremity clubbing or cyanosis.   ECG: Most recent ECG reviewed.    ASSESSMENT AND PLAN: 1. Atrial fibrillation: Now rate controlled after increase of verapamil to 240 mg in the morning and 120 mg in the evening. I will discontinue digoxin. I  will continue Pradaxa 150 mg twice daily for anticoagulation for now, but he is considering switching to warfarin. I previously provided him with samples.  2. Essential hypertension: Well controlled. No changes.  3. 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 CTA was ordered but patient has not pursued.  4. Chronic lower extremity edema: Not on a diuretic. Previously followed with vascular surgery. He plans to follow up with them next month. He has several comorbidities which put him at risk for the development of peripheral vascular disease.  5. Hyperlipidemia: On pravastatin 20 mg. Will obtain copy of lipid panel.  Dispo: f/u 6 months.   Kate Sable, M.D., F.A.C.C.

## 2014-06-24 NOTE — Patient Instructions (Addendum)
Your physician wants you to follow-up in: 6 month with Koneswaran.  You will receive a reminder letter in the mail two months in advance. If you don't receive a letter, please call our office to schedule the follow-up appointment.   STOP TAKING: Digoxin  Thank you for choosing [facility]!

## 2014-07-08 ENCOUNTER — Encounter: Payer: Self-pay | Admitting: Cardiovascular Disease

## 2014-08-18 ENCOUNTER — Telehealth: Payer: Self-pay | Admitting: Cardiovascular Disease

## 2014-08-18 NOTE — Telephone Encounter (Signed)
Needs samples of Pradaxa 150 mg / tg

## 2014-08-19 NOTE — Telephone Encounter (Signed)
Patient would like a callback today in reference to him needing Pradaxa samples

## 2014-08-19 NOTE — Telephone Encounter (Signed)
Samples at front desk 

## 2015-11-24 ENCOUNTER — Ambulatory Visit: Payer: Medicare Other | Admitting: Orthopaedic Surgery

## 2015-11-27 ENCOUNTER — Inpatient Hospital Stay (HOSPITAL_COMMUNITY)
Admission: EM | Admit: 2015-11-27 | Discharge: 2015-12-01 | DRG: 603 | Disposition: A | Discharge status: Home / Self Care | Payer: Medicare Other | Attending: Internal Medicine | Admitting: Internal Medicine

## 2015-11-27 ENCOUNTER — Emergency Department (HOSPITAL_COMMUNITY): Payer: Medicare Other

## 2015-11-27 ENCOUNTER — Encounter (HOSPITAL_COMMUNITY): Payer: Self-pay | Admitting: Emergency Medicine

## 2015-11-27 DIAGNOSIS — Z79899 Other long term (current) drug therapy: Secondary | ICD-10-CM

## 2015-11-27 DIAGNOSIS — Z7982 Long term (current) use of aspirin: Secondary | ICD-10-CM | POA: Diagnosis not present

## 2015-11-27 DIAGNOSIS — E861 Hypovolemia: Secondary | ICD-10-CM | POA: Diagnosis present

## 2015-11-27 DIAGNOSIS — E785 Hyperlipidemia, unspecified: Secondary | ICD-10-CM | POA: Diagnosis present

## 2015-11-27 DIAGNOSIS — E11622 Type 2 diabetes mellitus with other skin ulcer: Secondary | ICD-10-CM | POA: Diagnosis present

## 2015-11-27 DIAGNOSIS — Z794 Long term (current) use of insulin: Secondary | ICD-10-CM | POA: Diagnosis not present

## 2015-11-27 DIAGNOSIS — I878 Other specified disorders of veins: Secondary | ICD-10-CM | POA: Diagnosis present

## 2015-11-27 DIAGNOSIS — Z6841 Body Mass Index (BMI) 40.0 and over, adult: Secondary | ICD-10-CM

## 2015-11-27 DIAGNOSIS — I83009 Varicose veins of unspecified lower extremity with ulcer of unspecified site: Secondary | ICD-10-CM | POA: Diagnosis present

## 2015-11-27 DIAGNOSIS — E119 Type 2 diabetes mellitus without complications: Secondary | ICD-10-CM | POA: Diagnosis not present

## 2015-11-27 DIAGNOSIS — Z7901 Long term (current) use of anticoagulants: Secondary | ICD-10-CM

## 2015-11-27 DIAGNOSIS — Z833 Family history of diabetes mellitus: Secondary | ICD-10-CM

## 2015-11-27 DIAGNOSIS — L0291 Cutaneous abscess, unspecified: Secondary | ICD-10-CM | POA: Diagnosis not present

## 2015-11-27 DIAGNOSIS — I4891 Unspecified atrial fibrillation: Secondary | ICD-10-CM | POA: Diagnosis present

## 2015-11-27 DIAGNOSIS — L03116 Cellulitis of left lower limb: Principal | ICD-10-CM | POA: Diagnosis present

## 2015-11-27 DIAGNOSIS — I10 Essential (primary) hypertension: Secondary | ICD-10-CM | POA: Diagnosis present

## 2015-11-27 DIAGNOSIS — R509 Fever, unspecified: Secondary | ICD-10-CM | POA: Diagnosis not present

## 2015-11-27 DIAGNOSIS — L0390 Cellulitis, unspecified: Secondary | ICD-10-CM

## 2015-11-27 DIAGNOSIS — I482 Chronic atrial fibrillation: Secondary | ICD-10-CM | POA: Diagnosis present

## 2015-11-27 DIAGNOSIS — A419 Sepsis, unspecified organism: Secondary | ICD-10-CM

## 2015-11-27 DIAGNOSIS — I272 Other secondary pulmonary hypertension: Secondary | ICD-10-CM | POA: Diagnosis present

## 2015-11-27 DIAGNOSIS — L97909 Non-pressure chronic ulcer of unspecified part of unspecified lower leg with unspecified severity: Secondary | ICD-10-CM | POA: Diagnosis present

## 2015-11-27 DIAGNOSIS — Z825 Family history of asthma and other chronic lower respiratory diseases: Secondary | ICD-10-CM

## 2015-11-27 LAB — CBC WITH DIFFERENTIAL/PLATELET
Basophils Absolute: 0 10*3/uL (ref 0.0–0.1)
Basophils Relative: 0 %
EOS PCT: 0 %
Eosinophils Absolute: 0 10*3/uL (ref 0.0–0.7)
HEMATOCRIT: 40.7 % (ref 39.0–52.0)
Hemoglobin: 12.9 g/dL — ABNORMAL LOW (ref 13.0–17.0)
LYMPHS ABS: 0.4 10*3/uL — AB (ref 0.7–4.0)
LYMPHS PCT: 2 %
MCH: 29.4 pg (ref 26.0–34.0)
MCHC: 31.7 g/dL (ref 30.0–36.0)
MCV: 92.7 fL (ref 78.0–100.0)
MONO ABS: 1.2 10*3/uL — AB (ref 0.1–1.0)
MONOS PCT: 6 %
NEUTROS ABS: 18.1 10*3/uL — AB (ref 1.7–7.7)
Neutrophils Relative %: 92 %
PLATELETS: 212 10*3/uL (ref 150–400)
RBC: 4.39 MIL/uL (ref 4.22–5.81)
RDW: 14.5 % (ref 11.5–15.5)
WBC: 19.6 10*3/uL — ABNORMAL HIGH (ref 4.0–10.5)

## 2015-11-27 LAB — URINALYSIS, ROUTINE W REFLEX MICROSCOPIC
Bilirubin Urine: NEGATIVE
KETONES UR: NEGATIVE mg/dL
Leukocytes, UA: NEGATIVE
Nitrite: NEGATIVE
PH: 6 (ref 5.0–8.0)
PROTEIN: 100 mg/dL — AB
Specific Gravity, Urine: 1.02 (ref 1.005–1.030)

## 2015-11-27 LAB — I-STAT CHEM 8, ED
BUN: 24 mg/dL — ABNORMAL HIGH (ref 6–20)
CALCIUM ION: 1.16 mmol/L (ref 1.12–1.23)
CHLORIDE: 101 mmol/L (ref 101–111)
CREATININE: 1.1 mg/dL (ref 0.61–1.24)
GLUCOSE: 117 mg/dL — AB (ref 65–99)
HCT: 41 % (ref 39.0–52.0)
Hemoglobin: 13.9 g/dL (ref 13.0–17.0)
POTASSIUM: 4.4 mmol/L (ref 3.5–5.1)
Sodium: 140 mmol/L (ref 135–145)
TCO2: 24 mmol/L (ref 0–100)

## 2015-11-27 LAB — URINE MICROSCOPIC-ADD ON

## 2015-11-27 LAB — COMPREHENSIVE METABOLIC PANEL
ALT: 15 U/L — ABNORMAL LOW (ref 17–63)
AST: 20 U/L (ref 15–41)
Albumin: 3.3 g/dL — ABNORMAL LOW (ref 3.5–5.0)
Alkaline Phosphatase: 64 U/L (ref 38–126)
Anion gap: 5 (ref 5–15)
BILIRUBIN TOTAL: 1.9 mg/dL — AB (ref 0.3–1.2)
BUN: 24 mg/dL — AB (ref 6–20)
CO2: 25 mmol/L (ref 22–32)
CREATININE: 1.11 mg/dL (ref 0.61–1.24)
Calcium: 8.7 mg/dL — ABNORMAL LOW (ref 8.9–10.3)
Chloride: 107 mmol/L (ref 101–111)
GFR calc non Af Amer: >60 mL/min (ref >60)
Glucose, Bld: 114 mg/dL — ABNORMAL HIGH (ref 65–99)
Potassium: 4.2 mmol/L (ref 3.5–5.1)
Sodium: 137 mmol/L (ref 135–145)
Total Protein: 6.9 g/dL (ref 6.5–8.1)

## 2015-11-27 LAB — APTT: aPTT: 38 seconds — ABNORMAL HIGH (ref 24–36)

## 2015-11-27 LAB — I-STAT CG4 LACTIC ACID, ED: Lactic Acid, Venous: 1.17 mmol/L (ref 0.5–1.9)

## 2015-11-27 MED ORDER — SODIUM CHLORIDE 0.9 % IV BOLUS (SEPSIS)
1000.0000 mL | Freq: Once | INTRAVENOUS | Status: AC
  Administered 2015-11-27: 1000 mL via INTRAVENOUS

## 2015-11-27 MED ORDER — VANCOMYCIN HCL IN DEXTROSE 1-5 GM/200ML-% IV SOLN
1000.0000 mg | Freq: Once | INTRAVENOUS | Status: AC
  Administered 2015-11-27: 1000 mg via INTRAVENOUS
  Filled 2015-11-27: qty 200

## 2015-11-27 MED ORDER — SODIUM CHLORIDE 0.9 % IV BOLUS (SEPSIS)
500.0000 mL | Freq: Once | INTRAVENOUS | Status: AC
  Administered 2015-11-27: 500 mL via INTRAVENOUS

## 2015-11-27 MED ORDER — PIPERACILLIN-TAZOBACTAM 3.375 G IVPB 30 MIN
3.3750 g | Freq: Once | INTRAVENOUS | Status: AC
  Administered 2015-11-27: 3.375 g via INTRAVENOUS
  Filled 2015-11-27: qty 50

## 2015-11-27 MED ORDER — DABIGATRAN ETEXILATE MESYLATE 150 MG PO CAPS
150.0000 mg | ORAL_CAPSULE | Freq: Once | ORAL | Status: AC
  Administered 2015-11-28: 150 mg via ORAL
  Filled 2015-11-27: qty 1

## 2015-11-27 MED ORDER — PIPERACILLIN-TAZOBACTAM 3.375 G IVPB
3.3750 g | Freq: Three times a day (TID) | INTRAVENOUS | Status: DC
  Administered 2015-11-28 – 2015-12-01 (×10): 3.375 g via INTRAVENOUS
  Filled 2015-11-27 (×8): qty 50

## 2015-11-27 MED ORDER — SODIUM CHLORIDE 0.9 % IV SOLN
1750.0000 mg | INTRAVENOUS | Status: DC
  Administered 2015-11-28 – 2015-11-30 (×3): 1750 mg via INTRAVENOUS
  Filled 2015-11-27: qty 1750
  Filled 2015-11-27: qty 500
  Filled 2015-11-27 (×2): qty 1750

## 2015-11-27 NOTE — ED Notes (Signed)
Pt has chronic lower leg edema with redness and venous stasis ulcers.  Leg wraps in place, last changed last Thursday.  Redness extending up beyond knee on left.

## 2015-11-27 NOTE — H&P (Addendum)
History and Physical    Jonathan Wise JWJ:[id number] DOB: [birth year] DOA: 11/27/2015  Referring MD/NP/PA: Margarita Grizzleanielle Ray, MD PCP: [name],[name], MD  Outpatient Specialists:   Cardiology: Laqueta LindenSuresh A Koneswaran, MD Patient coming from: Home  Chief Complaint: fever and generalized weakness  HPI: Jonathan Wise is a 70 y.o. male with medical history significant of A-fib, morbid obesity, HTN, and HLD presents to the ED with complaint of fever and generalized weakness that onset earlier today. He denies vomiting and diarrhea, but he reports feeling weak since 1 am last night. He also admits to having a fever of 101. He has not ate anything since 3 pm today. Pt usually is able to ambulate with a walker but has not been able to ambulate as well for the past 2 days. He also reports erythema on his legs.  Review of chart showed that he has contemplated changing Pradaxa to Coumadin, however, this has not been accomplished.  For his DM, he has been on several oral meds, and has been on Treshiba (Deglunec insulin) for the past several months.   ED Course: While in the ED, pt was febrile, BUN 24, Glucose 117, lactic acid 1.17, WBC 19.6, and Hgb 13.9. CXR was unremarkable. EKG showed sinus rhythm. Pt was further admitted for sepsis management with suspicious source at least from  LLE cellulitis.  Review of Systems: As per HPI otherwise 10 point review of systems negative.   Past Medical History:  Diagnosis Date  . Atrial fibrillation (HCC)    On Pradaxa  . Bilateral lower extremity edema   . DVT (deep venous thrombosis) (HCC) 1994   LLE. Completed tx with coumadin  . Hyperlipidemia   . Hypertension   . Kidney stones   . Morbid obesity (HCC)   . Pulmonary artery hypertension (HCC) 10/18/2011  . Type 2 diabetes mellitus (HCC)   . Urinary retention 10/15/2011  . Venous stasis ulcers (HCC)     Past Surgical History:  Procedure Laterality Date  . CYSTOSCOPY  10/18/2011   Procedure: CYSTOSCOPY  FLEXIBLE;  Surgeon: Ky BarbanMohammad I Javaid, MD;  Location: [facility];  Service: Urology;  Laterality: N/A;  . HERNIA REPAIR     Umbilical     reports that he has never smoked. He has never used smokeless tobacco. He reports that he does not drink alcohol or use drugs.  Allergies  Allergen Reactions  . Furosemide Other (See Comments)    Dizziness , drowsiness    Family History  Problem Relation Age of Onset  . Diabetes Mother   . Emphysema Father     Prior to Admission medications   Medication Sig Start Date End Date Taking? Authorizing Provider  aspirin EC 81 MG tablet Take 81 mg by mouth every evening.   Yes Historical Provider, MD  canagliflozin (INVOKANA) 100 MG TABS tablet Take 100 mg by mouth daily.   Yes Historical Provider, MD  Flaxseed, Linseed, (FLAX SEED OIL) 1000 MG CAPS Take 1-4 capsules by mouth daily.    Yes Historical Provider, MD  Insulin Degludec 200 UNIT/ML SOPN Inject 30 Units into the skin every evening. tresiba   Yes Historical Provider, MD  lisinopril (PRINIVIL,ZESTRIL) 5 MG tablet Take 5 mg by mouth daily.   Yes Historical Provider, MD  metFORMIN (GLUCOPHAGE) 500 MG tablet Take 1,000 mg by mouth every evening.    Yes Historical Provider, MD  pioglitazone (ACTOS) 45 MG tablet Take 45 mg by mouth daily.   Yes Historical Provider, MD  pravastatin (  PRAVACHOL) 20 MG tablet Take 20 mg by mouth daily.   Yes Historical Provider, MD  verapamil (CALAN) 120 MG tablet Take 240 mg every AM and 120 mg every PM Patient taking differently: Take 120-240 mg by mouth 2 (two) times daily. Take 240 mg every AM and 120 mg every PM 06/01/14  Yes Laqueta LindenSuresh A Koneswaran, MD    Physical Exam: Vitals:   11/27/15 1930 11/27/15 2007 11/27/15 2008 11/27/15 2047  BP: (!) 99/50 (!) 109/49  125/62  Pulse: 69  87 83  Resp: (!) 33 20 20 20   Temp:      TempSrc:      SpO2: 97%  99% 100%  Weight:      Height:          Constitutional: NAD, calm, comfortable Vitals:   11/27/15 1930 11/27/15 2007  11/27/15 2008 11/27/15 2047  BP: (!) 99/50 (!) 109/49  125/62  Pulse: 69  87 83  Resp: (!) 33 20 20 20   Temp:      TempSrc:      SpO2: 97%  99% 100%  Weight:      Height:       Eyes: PERRL, lids and conjunctivae normal ENMT: Mucous membranes are moist. Posterior pharynx clear of any exudate or lesions.Normal dentition.  Neck: normal, supple, no masses, no thyromegaly Respiratory: clear to auscultation bilaterally, no wheezing, no crackles. Normal respiratory effort. No accessory muscle use.  Cardiovascular: Regular rate and rhythm, no murmurs / rubs / gallops. No extremity edema. 2+ pedal pulses. No carotid bruits.  Abdomen: no tenderness, no masses palpated. No hepatosplenomegaly. Bowel sounds positive.  Musculoskeletal: no clubbing / cyanosis. No joint deformity upper and lower extremities. Good ROM, no contractures. Normal muscle tone.  Skin: no rashes, lesions, ulcers. No induration there are venous changes and scaling skin with erythema on the lower lower extremitites.  Neurologic: CN 2-12 grossly intact. Sensation intact, DTR normal. Strength 5/5 in all 4.  Psychiatric: Normal judgment and insight. Alert and oriented x 3. Normal mood.    Labs on Admission: I have personally reviewed following labs and imaging studies  CBC:  Recent Labs Lab 11/27/15 1840 11/27/15 1853  WBC 19.6*  --   NEUTROABS 18.1*  --   HGB 12.9* 13.9  HCT 40.7 41.0  MCV 92.7  --   PLT 212  --    Basic Metabolic Panel:  Recent Labs Lab 11/27/15 1840 11/27/15 1853  NA 137 140  K 4.2 4.4  CL 107 101  CO2 25  --   GLUCOSE 114* 117*  BUN 24* 24*  CREATININE 1.11 1.10  CALCIUM 8.7*  --    Liver Function Tests:  Recent Labs Lab 11/27/15 1840  AST 20  ALT 15*  ALKPHOS 64  BILITOT 1.9*  PROT 6.9  ALBUMIN 3.3*   Urine analysis:    Component Value Date/Time   COLORURINE YELLOW 03/15/2014 1254   APPEARANCEUR CLEAR 03/15/2014 1254   LABSPEC 1.020 03/15/2014 1254   PHURINE 5.0  03/15/2014 1254   GLUCOSEU >1000 (A) 03/15/2014 1254   HGBUR NEGATIVE 03/15/2014 1254   BILIRUBINUR NEGATIVE 03/15/2014 1254   KETONESUR NEGATIVE 03/15/2014 1254   PROTEINUR TRACE (A) 03/15/2014 1254   UROBILINOGEN 0.2 03/15/2014 1254   NITRITE NEGATIVE 03/15/2014 1254   LEUKOCYTESUR SMALL (A) 03/15/2014 1254    Radiological Exams on Admission: Dg Chest Port 1 View  Result Date: 11/27/2015 CLINICAL DATA:  70 year old male with weakness and presyncope. Possible hypoglycemia. Shortness of breath  and fever. Initial encounter. EXAM: PORTABLE CHEST 1 VIEW COMPARISON:  03/15/2014 portable chest and earlier. FINDINGS: Portable AP supine view at 1854 hours. Large body habitus. Stable cardiomegaly and mediastinal contours. Pulmonary vascularity appears stable. No overt edema. Otherwise when Allowing for portable technique the lungs are clear. Visualized tracheal air column is within normal limits. No pneumothorax or pleural effusion. IMPRESSION: No acute cardiopulmonary abnormality. Electronically Signed   By: Odessa Fleming M.D.   On: 11/27/2015 19:09    EKG: Independently reviewed. Sinus Rhythm  Assessment/Plan Principal Problem:   Cellulitis of leg, left Active Problems:   Hypovolemia   Venous stasis ulcers (HCC)   Type 2 diabetes mellitus (HCC)   Morbid obesity (HCC)   Current use of long term anticoagulation   Atrial fibrillation (HCC)   Cellulitis and abscess  1. Possible sepsis:  The only clear source is cellulitis of leg, left. WBC 19.6.  He was started on IV Van/Zosyn in the ER.  Will continue daily. Monitor and recheck CBC in am. 2. UA shows many bacteria. But no pyuria, and he has no dysuria.   Follow up urine culture. 3. Cellulitis and abscess. 4. Hypotension:  Suspect he has volume depletion, aggravated with his BP meds.  Will hold CCB, Losartan, and diuretics.  Will give him IVF.  Follow BP carefully.  It has already improved.  5. Venous stasis ulcers.  No evidence of ulcer.  Routine  wound care.  6. A-fib. Rate controlled. EKG showed sinus rhythm. Will continue with his Pradaxa as per Dr Ellin Saba plan.  7. DM type 2. Stable. Hold metformin, Actos, and other oral hypoglycemic agents.  Will use SSI and carb modified diet.  8. HTN.  Will need to hold his meds until his BP is better.  9. Morbid obesity.  DVT prophylaxis: Heparin Code Status: Full Family Communication: Wife at bedside Disposition Plan: Discharge home once improved Consults called: None Admission status: Admit to inpatient  Houston Siren, MD FACP.  [facility] If 7PM-7AM, please contact night-coverage [email or web address] Password [facility]  11/27/2015, 9:53 PM   By signing my name below, I, Bobbie Stack, attest that this documentation has been prepared under the direction and in the presence of Houston Siren, MD. Electronically signed: Bobbie Stack, Scribe.  11/27/15, 10:20 PM

## 2015-11-27 NOTE — ED Provider Notes (Addendum)
[facility] DEPT Provider Note   CSN: [id number] Arrival date & time: 11/27/15  1820     History   Chief Complaint Chief Complaint  Patient presents with  . Hypotension    HPI Jonathan Wise is a 70 y.o. male.  HPI   [name] is a 70 year old male with atrial fibrillation on Pradaxa, chronic lower extremity edema with inability, morbid obesity, hypertension, and hyper hyperlipidemia who presents today with fever and generalized weakness. He has been generally weak today and unable to ambulate as usual. He reports some dyspnea, increased frequency of urination. He denies any chills, vomiting, or diarrhea. Fever and weak today with some dyspnea, nausea with without vomiting, no diarrhea, increased frequency of urination, bilateral lower extremity swelling now with some erythema creeping up on the left lower extremity Past Medical History:  Diagnosis Date  . Atrial fibrillation ([place])    On Pradaxa  . Bilateral lower extremity edema   . DVT (deep venous thrombosis) (HCC) 1994   LLE. Completed tx with coumadin  . Hyperlipidemia   . Hypertension   . Kidney stones   . Morbid obesity ([place])   . Pulmonary artery hypertension ([place]) 10/18/2011  . Type 2 diabetes mellitus ([place])   . Urinary retention 10/15/2011  . Venous stasis ulcers [facility])     Patient Active Problem List   Diagnosis Date Noted  . Bradycardia 03/15/2014  . UTI (urinary tract infection) 03/15/2014  . Leukocytosis 03/15/2014  . Lactic acidosis 03/15/2014  . Atrial fibrillation ([place]) 06/23/2012  . PVC's (premature ventricular contractions) 06/23/2012  . Cellulitis of leg, left 06/22/2012  . SIRS (systemic inflammatory response syndrome) ([place]) 06/22/2012  . Ketonuria 06/22/2012  . Morbid obesity ([place]) 06/22/2012  . Osteoarthritis of right knee 06/22/2012  . Current use of long term anticoagulation 06/22/2012  . Pulmonary artery hypertension ([place]) 10/18/2011  . Metabolic acidosis 00/86/7619  . Urinary retention  10/15/2011  . Cellulitis of left leg 10/14/2011  . Bilateral lower extremity edema 10/14/2011  . Hypotension 10/14/2011  . Hypovolemia 10/14/2011  . Acute renal failure ([place]) 10/14/2011  . Venous stasis ulcers ([place]) 10/14/2011  . Hyponatremia 10/14/2011  . Atrial fibrillation with rapid ventricular response ([place]) 10/14/2011  . Nausea 10/14/2011  . Type 2 diabetes mellitus ([place]) 10/14/2011    Past Surgical History:  Procedure Laterality Date  . CYSTOSCOPY  10/18/2011   Procedure: CYSTOSCOPY FLEXIBLE;  Surgeon: Marissa Nestle, MD;  Location: [facility];  Service: Urology;  Laterality: N/A;  . HERNIA REPAIR     Umbilical       Home Medications    Prior to Admission medications   Medication Sig Start Date End Date Taking? Authorizing Provider  aspirin EC 81 MG tablet Take 81 mg by mouth daily.    Historical Provider, MD  bd getting started take home kit MISC 1 kit by Other route once. 06/24/12   Rexene Alberts, MD  canagliflozin (INVOKANA) 100 MG TABS tablet Take 100 mg by mouth daily.    Historical Provider, MD  dabigatran (PRADAXA) 150 MG CAPS capsule Take 150 mg by mouth 2 (two) times daily.    Historical Provider, MD  doxycycline (VIBRAMYCIN) 100 MG capsule Take 1 capsule (100 mg total) by mouth 2 (two) times daily. 05/31/14   Orpah Greek, MD  Flaxseed, Linseed, (FLAX SEED OIL) 1000 MG CAPS Take 1-4 capsules by mouth daily.     Historical Provider, MD  insulin detemir (LEVEMIR) 100 UNIT/ML injection Inject 20 Units into the skin  at bedtime. Patient taking differently: Inject 30 Units into the skin daily. Injects around 1700 hours 06/24/12   Rexene Alberts, MD  metFORMIN (GLUCOPHAGE) 500 MG tablet Take 500-1,000 mg by mouth 2 (two) times daily. Takes 500 mg in the morning and takes 1000 mg in the evening    Historical Provider, MD  pioglitazone (ACTOS) 45 MG tablet Take 45 mg by mouth daily.    Historical Provider, MD  pravastatin (PRAVACHOL) 20 MG tablet Take 20 mg by mouth  daily.    Historical Provider, MD  verapamil (CALAN) 120 MG tablet Take 240 mg every AM and 120 mg every PM 06/01/14   Herminio Commons, MD    Family History Family History  Problem Relation Age of Onset  . Diabetes Mother   . Emphysema Father     Social History Social History  Substance Use Topics  . Smoking status: Never Smoker  . Smokeless tobacco: Never Used  . Alcohol use No     Allergies   Furosemide   Review of Systems Review of Systems  Constitutional: Positive for appetite change.  HENT: Negative.   Eyes: Negative.   Respiratory: Positive for shortness of breath.   Cardiovascular: Positive for leg swelling.  Gastrointestinal: Negative.   Endocrine: Negative.   Genitourinary: Positive for frequency.  Allergic/Immunologic: Negative.   All other systems reviewed and are negative.    Physical Exam Updated Vital Signs BP (!) 92/48 (BP Location: Right Arm)   Pulse 64   Temp 100 F (37.8 C) (Oral)   Resp 20   Ht 6' (1.829 m)   Wt (!) 145.2 kg   SpO2 97%   BMI 43.40 kg/m   Physical Exam  Constitutional: He appears well-developed and well-nourished.  Morbidly obese  HENT:  Head: Normocephalic and atraumatic.  Eyes: EOM are normal. Pupils are equal, round, and reactive to light.  Neck: Normal range of motion. Neck supple.  Cardiovascular: Normal rate.   Pulmonary/Chest: Effort normal and breath sounds normal.  Abdominal: Soft. Bowel sounds are normal.  Musculoskeletal: He exhibits edema.  Unna boots bilateral lower extremities with erythema lle creeping proximally  Skin: Skin is warm. Capillary refill takes less than 2 seconds.  Nursing note and vitals reviewed.    ED Treatments / Results  Labs (all labs ordered are listed, but only abnormal results are displayed) Labs Reviewed  COMPREHENSIVE METABOLIC PANEL - Abnormal; Notable for the following:       Result Value   Glucose, Bld 114 (*)    BUN 24 (*)    Calcium 8.7 (*)    Albumin 3.3 (*)     ALT 15 (*)    Total Bilirubin 1.9 (*)    All other components within normal limits  CBC WITH DIFFERENTIAL/PLATELET - Abnormal; Notable for the following:    WBC 19.6 (*)    Hemoglobin 12.9 (*)    Neutro Abs 18.1 (*)    Lymphs Abs 0.4 (*)    Monocytes Absolute 1.2 (*)    All other components within normal limits  URINALYSIS, ROUTINE W REFLEX MICROSCOPIC (NOT AT [facility]) - Abnormal; Notable for the following:    Color, Urine AMBER (*)    Glucose, UA >1000 (*)    Hgb urine dipstick TRACE (*)    Protein, ur 100 (*)    All other components within normal limits  URINE MICROSCOPIC-ADD ON - Abnormal; Notable for the following:    Squamous Epithelial / LPF 0-5 (*)    Bacteria, UA  MANY (*)    Casts GRANULAR CAST (*)    All other components within normal limits  APTT - Abnormal; Notable for the following:    aPTT 38 (*)    All other components within normal limits  COMPREHENSIVE METABOLIC PANEL - Abnormal; Notable for the following:    BUN 25 (*)    Calcium 8.5 (*)    Albumin 3.1 (*)    ALT 15 (*)    Total Bilirubin 1.6 (*)    All other components within normal limits  CBC - Abnormal; Notable for the following:    WBC 18.2 (*)    RBC 4.14 (*)    Hemoglobin 12.1 (*)    HCT 38.8 (*)    All other components within normal limits  GLUCOSE, CAPILLARY - Abnormal; Notable for the following:    Glucose-Capillary 150 (*)    All other components within normal limits  GLUCOSE, CAPILLARY - Abnormal; Notable for the following:    Glucose-Capillary 126 (*)    All other components within normal limits  GLUCOSE, CAPILLARY - Abnormal; Notable for the following:    Glucose-Capillary 116 (*)    All other components within normal limits  BASIC METABOLIC PANEL - Abnormal; Notable for the following:    BUN 24 (*)    Calcium 8.2 (*)    All other components within normal limits  CBC - Abnormal; Notable for the following:    WBC 11.7 (*)    RBC 4.14 (*)    Hemoglobin 12.1 (*)    All other  components within normal limits  GLUCOSE, CAPILLARY - Abnormal; Notable for the following:    Glucose-Capillary 100 (*)    All other components within normal limits  GLUCOSE, CAPILLARY - Abnormal; Notable for the following:    Glucose-Capillary 103 (*)    All other components within normal limits  GLUCOSE, CAPILLARY - Abnormal; Notable for the following:    Glucose-Capillary 102 (*)    All other components within normal limits  I-STAT CHEM 8, ED - Abnormal; Notable for the following:    BUN 24 (*)    Glucose, Bld 117 (*)    All other components within normal limits  CULTURE, BLOOD (ROUTINE X 2)  CULTURE, BLOOD (ROUTINE X 2)  MRSA PCR SCREENING  URINE CULTURE  TSH  GLUCOSE, CAPILLARY  GLUCOSE, CAPILLARY  GLUCOSE, CAPILLARY  GLUCOSE, CAPILLARY  GLUCOSE, CAPILLARY  GLUCOSE, CAPILLARY  HEMOGLOBIN A1C  I-STAT CG4 LACTIC ACID, ED    EKG  EKG Interpretation  Date/Time:  Saturday November 27 2015 18:22:36 EDT Ventricular Rate:  61 PR Interval:    QRS Duration: 90 QT Interval:  545 QTC Calculation: 550 R Axis:   50 Text Interpretation:  Atrial fibrillation Confirmed by Randy Castrejon MD, Andee Poles [phone]) on 11/27/2015 10:12:18 PM       Radiology Dg Chest Port 1 View  Result Date: 11/27/2015 CLINICAL DATA:  70 year old male with weakness and presyncope. Possible hypoglycemia. Shortness of breath and fever. Initial encounter. EXAM: PORTABLE CHEST 1 VIEW COMPARISON:  03/15/2014 portable chest and earlier. FINDINGS: Portable AP supine view at 1854 hours. Large body habitus. Stable cardiomegaly and mediastinal contours. Pulmonary vascularity appears stable. No overt edema. Otherwise when Allowing for portable technique the lungs are clear. Visualized tracheal air column is within normal limits. No pneumothorax or pleural effusion. IMPRESSION: No acute cardiopulmonary abnormality. Electronically Signed   By: Genevie Ann M.D.   On: 11/27/2015 19:09    Procedures Procedures (including critical care  time)  Medications Ordered in ED Medications  sodium chloride 0.9 % bolus 1,000 mL (not administered)    And  sodium chloride 0.9 % bolus 1,000 mL (not administered)    And  sodium chloride 0.9 % bolus 1,000 mL (not administered)    And  sodium chloride 0.9 % bolus 1,000 mL (not administered)    And  sodium chloride 0.9 % bolus 500 mL (not administered)  piperacillin-tazobactam (ZOSYN) IVPB 3.375 g (not administered)  vancomycin (VANCOCIN) IVPB 1000 mg/200 mL premix (not administered)     Initial Impression / Assessment and Plan / ED Course  I have reviewed the triage vital signs and the nursing notes.  Pertinent labs & imaging results that were available during my care of the patient were reviewed by me and considered in my medical decision making (see chart for details).  Vitals:   11/29/15 1200 11/29/15 1300 11/29/15 1400 11/29/15 1500  BP:      Pulse: 89 88 80 76  Resp: (!) 21 (!) 21 (!) 23 (!) 25  Temp:      TempSrc:      SpO2: 98% 97% 97% 95%  Weight:      Height:         Clinical Course      Final Clinical Impressions(s) / ED Diagnoses   Final diagnoses:  Sepsis, due to unspecified organism ([place])   1- sepsis 2- cellulitis 3-hypotension-?secondary to volume depletion  New Prescriptions New Prescriptions   No medications on file     Pattricia Boss, MD 11/29/15 1519    Pattricia Boss, MD 11/29/15 1521

## 2015-11-27 NOTE — ED Triage Notes (Signed)
Pt called ems for suspected hypoglycemia as he felt weak and like he was going to pass out.  EMS found pt to be hypotensive with systolic pressure in 70s.  Pt states he has been very nauseated with no vomiting.  CBG 209.

## 2015-11-27 NOTE — ED Notes (Signed)
Pt arrived via EMS with 1 Liter of NS hanging.  This bag completed infusing on arrival.

## 2015-11-27 NOTE — Progress Notes (Signed)
Pharmacy Antibiotic Note  Jonathan HibbsRobert L Wise is a 70 y.o. male admitted on 11/27/2015 with sepsis.  Pharmacy has been consulted for vancomycin and zosyn dosing. Initial doses ordered in the ED  Plan: Give additional 1 gm vanc for total load of 2 gm then 1750 mg IV q24 hours Cont zosyn 3.375 g IV q8 hours F/u renal function, cultures and clinical course  Height: 6' (182.9 cm) Weight: (!) 320 lb (145.2 kg) IBW/kg (Calculated) : 77.6  Temp (24hrs), Avg:100 F (37.8 C), Min:100 F (37.8 C), Max:100 F (37.8 C)   Recent Labs Lab 11/27/15 1853  CREATININE 1.10  LATICACIDVEN 1.17    Estimated Creatinine Clearance: 92.4 mL/min (by C-G formula based on SCr of 1.1 mg/dL).    Allergies  Allergen Reactions  . Furosemide Other (See Comments)    Dizziness , drowsiness    Antimicrobials this admission: vanc 8/19 >>  zosyn 8/19 >>    Thank you for allowing pharmacy to be a part of this patient's care.  Woodfin GanjaSeay, Dareen Gutzwiller Poteet 11/27/2015 7:48 PM

## 2015-11-27 NOTE — Progress Notes (Addendum)
ANTICOAGULATION CONSULT NOTE   Pharmacy Consult for dabigatran Indication: nonvalvular atrial fibrillation  Allergies  Allergen Reactions  . Furosemide Other (See Comments)    Dizziness , drowsiness    Patient Measurements: Height: 6' (182.9 cm) Weight: (!) 320 lb (145.2 kg) IBW/kg (Calculated) : 77.6 HEPARIN DW (KG): 111.4   Vital Signs: Temp: 100 F (37.8 C) (08/19 1825) Temp Source: Oral (08/19 1825) BP: 105/56 (08/19 2230) Pulse Rate: 101 (08/19 2200)  Labs:  Recent Labs  11/27/15 1840 11/27/15 1853  HGB 12.9* 13.9  HCT 40.7 41.0  PLT 212  --   CREATININE 1.11 1.10   Estimated Creatinine Clearance: 92.4 mL/min (by C-G formula based on SCr of 1.1 mg/dL).  Medical History: Past Medical History:  Diagnosis Date  . Atrial fibrillation (HCC)    On Pradaxa  . Bilateral lower extremity edema   . DVT (deep venous thrombosis) (HCC) 1994   LLE. Completed tx with coumadin  . Hyperlipidemia   . Hypertension   . Kidney stones   . Morbid obesity (HCC)   . Pulmonary artery hypertension (HCC) 10/18/2011  . Type 2 diabetes mellitus (HCC)   . Urinary retention 10/15/2011  . Venous stasis ulcers (HCC)     Medications:  Scheduled:  . dabigatran  150 mg Oral Once  . [START ON 11/28/2015] piperacillin-tazobactam (ZOSYN)  IV  3.375 g Intravenous Q8H  . [START ON 11/28/2015] vancomycin  1,750 mg Intravenous Q24H   Infusions:   PRN:  Anti-infectives    Start     Dose/Rate Route Frequency Ordered Stop   11/28/15 2000  vancomycin (VANCOCIN) 1,750 mg in sodium chloride 0.9 % 500 mL IVPB     1,750 mg 250 mL/hr over 120 Minutes Intravenous Every 24 hours 11/27/15 1947     11/28/15 0330  piperacillin-tazobactam (ZOSYN) IVPB 3.375 g     3.375 g 12.5 mL/hr over 240 Minutes Intravenous Every 8 hours 11/27/15 1943     11/27/15 1945  vancomycin (VANCOCIN) IVPB 1000 mg/200 mL premix     1,000 mg 200 mL/hr over 60 Minutes Intravenous  Once 11/27/15 1943 11/27/15 2153   11/27/15  1900  piperacillin-tazobactam (ZOSYN) IVPB 3.375 g     3.375 g 100 mL/hr over 30 Minutes Intravenous  Once 11/27/15 1847 11/27/15 2013   11/27/15 1900  vancomycin (VANCOCIN) IVPB 1000 mg/200 mL premix     1,000 mg 200 mL/hr over 60 Minutes Intravenous  Once 11/27/15 1847 11/27/15 2055      Assessment: 70 yo male with hx afib, obesity, HTN, and HLD. Starting dabigatran for nonvalvular a. Fib to reduce risk stroke, systemic embolism. Baseline aPTT pending.    Plan:  Dabigatran 150mg  once.  Preliminary review of pertinent patient information completed.  Jeani HawkingAnnie Penn clinical pharmacist will complete review during morning rounds to assess the patient and finalize treatment regimen.  [place], Tiffany Scarlett, RPH 11/27/2015,11:47 PM  Addum:  Continue pradaxa 150 mg po bid Talbert CageLora Jaspal Pultz, PharmD

## 2015-11-28 DIAGNOSIS — L97909 Non-pressure chronic ulcer of unspecified part of unspecified lower leg with unspecified severity: Secondary | ICD-10-CM

## 2015-11-28 DIAGNOSIS — I482 Chronic atrial fibrillation: Secondary | ICD-10-CM

## 2015-11-28 DIAGNOSIS — I83009 Varicose veins of unspecified lower extremity with ulcer of unspecified site: Secondary | ICD-10-CM

## 2015-11-28 DIAGNOSIS — E861 Hypovolemia: Secondary | ICD-10-CM

## 2015-11-28 DIAGNOSIS — Z7901 Long term (current) use of anticoagulants: Secondary | ICD-10-CM

## 2015-11-28 LAB — GLUCOSE, CAPILLARY
GLUCOSE-CAPILLARY: 100 mg/dL — AB (ref 65–99)
GLUCOSE-CAPILLARY: 126 mg/dL — AB (ref 65–99)
GLUCOSE-CAPILLARY: 150 mg/dL — AB (ref 65–99)
GLUCOSE-CAPILLARY: 69 mg/dL (ref 65–99)
GLUCOSE-CAPILLARY: 73 mg/dL (ref 65–99)
GLUCOSE-CAPILLARY: 86 mg/dL (ref 65–99)
GLUCOSE-CAPILLARY: 95 mg/dL (ref 65–99)
Glucose-Capillary: 116 mg/dL — ABNORMAL HIGH (ref 65–99)

## 2015-11-28 LAB — CBC
HCT: 38.8 % — ABNORMAL LOW (ref 39.0–52.0)
HEMOGLOBIN: 12.1 g/dL — AB (ref 13.0–17.0)
MCH: 29.2 pg (ref 26.0–34.0)
MCHC: 31.2 g/dL (ref 30.0–36.0)
MCV: 93.7 fL (ref 78.0–100.0)
Platelets: 209 10*3/uL (ref 150–400)
RBC: 4.14 MIL/uL — AB (ref 4.22–5.81)
RDW: 14.8 % (ref 11.5–15.5)
WBC: 18.2 10*3/uL — ABNORMAL HIGH (ref 4.0–10.5)

## 2015-11-28 LAB — MRSA PCR SCREENING: MRSA BY PCR: NEGATIVE

## 2015-11-28 LAB — COMPREHENSIVE METABOLIC PANEL
ALBUMIN: 3.1 g/dL — AB (ref 3.5–5.0)
ALK PHOS: 61 U/L (ref 38–126)
ALT: 15 U/L — AB (ref 17–63)
ANION GAP: 6 (ref 5–15)
AST: 21 U/L (ref 15–41)
BUN: 25 mg/dL — ABNORMAL HIGH (ref 6–20)
CALCIUM: 8.5 mg/dL — AB (ref 8.9–10.3)
CHLORIDE: 107 mmol/L (ref 101–111)
CO2: 26 mmol/L (ref 22–32)
Creatinine, Ser: 1.08 mg/dL (ref 0.61–1.24)
GFR calc non Af Amer: >60 mL/min (ref >60)
GLUCOSE: 88 mg/dL (ref 65–99)
Potassium: 4 mmol/L (ref 3.5–5.1)
SODIUM: 139 mmol/L (ref 135–145)
Total Bilirubin: 1.6 mg/dL — ABNORMAL HIGH (ref 0.3–1.2)
Total Protein: 6.6 g/dL (ref 6.5–8.1)

## 2015-11-28 LAB — TSH: TSH: 0.787 u[IU]/mL (ref 0.350–4.500)

## 2015-11-28 MED ORDER — DABIGATRAN ETEXILATE MESYLATE 150 MG PO CAPS
150.0000 mg | ORAL_CAPSULE | Freq: Two times a day (BID) | ORAL | Status: DC
  Administered 2015-11-28 – 2015-12-01 (×5): 150 mg via ORAL
  Filled 2015-11-28 (×12): qty 1

## 2015-11-28 MED ORDER — SENNOSIDES-DOCUSATE SODIUM 8.6-50 MG PO TABS
1.0000 | ORAL_TABLET | Freq: Every evening | ORAL | Status: DC | PRN
  Administered 2015-11-30: 1 via ORAL
  Filled 2015-11-28: qty 1

## 2015-11-28 MED ORDER — DABIGATRAN ETEXILATE MESYLATE 150 MG PO CAPS
ORAL_CAPSULE | ORAL | Status: AC
  Filled 2015-11-28: qty 1

## 2015-11-28 MED ORDER — PRAVASTATIN SODIUM 10 MG PO TABS
20.0000 mg | ORAL_TABLET | Freq: Every day | ORAL | Status: DC
  Administered 2015-11-28 – 2015-12-01 (×4): 20 mg via ORAL
  Filled 2015-11-28 (×4): qty 2

## 2015-11-28 MED ORDER — SODIUM CHLORIDE 0.9% FLUSH
3.0000 mL | Freq: Two times a day (BID) | INTRAVENOUS | Status: DC
  Administered 2015-11-28 – 2015-12-01 (×7): 3 mL via INTRAVENOUS

## 2015-11-28 MED ORDER — SODIUM CHLORIDE 0.9 % IV SOLN
INTRAVENOUS | Status: DC
  Administered 2015-11-28: 01:00:00 via INTRAVENOUS

## 2015-11-28 MED ORDER — INSULIN ASPART 100 UNIT/ML ~~LOC~~ SOLN
0.0000 [IU] | SUBCUTANEOUS | Status: DC
  Administered 2015-11-28 – 2015-11-29 (×2): 3 [IU] via SUBCUTANEOUS
  Administered 2015-11-30: 4 [IU] via SUBCUTANEOUS
  Administered 2015-11-30 – 2015-12-01 (×4): 3 [IU] via SUBCUTANEOUS

## 2015-11-28 MED ORDER — ASPIRIN EC 81 MG PO TBEC
81.0000 mg | DELAYED_RELEASE_TABLET | Freq: Every evening | ORAL | Status: DC
  Administered 2015-11-28 – 2015-11-29 (×2): 81 mg via ORAL
  Filled 2015-11-28 (×2): qty 1

## 2015-11-28 NOTE — Progress Notes (Signed)
PROGRESS NOTE    Jonathan Wise  ZOX:[id number] DOB: [birth year] DOA: 11/27/2015 PCP: [name],[name], MD    Brief Narrative:  Patient is a 70 year old man with chronic A. fib on Pradaxa, morbid obesity, DM, and chronic lower extremity venous stasis who presented to the ED on 11/27/15 with a complaint of generalized weakness, fever, and worsening redness of his left leg. In the ED, he was febrile with a temperature of 100 and hemodynamically stable, although his blood pressure was in the low 90s systolically. His white blood cell count was elevated at 19.6. His lactic acid was within normal limits. His UA revealed many bacteria but no WBCs. His CXR revealed no acute disease. He was admitted for management of LLE cellulitis.    Assessment & Plan:   Principal Problem:   Cellulitis of leg, left Active Problems:   Venous stasis ulcers (HCC)   Hypovolemia   Type 2 diabetes mellitus (HCC)   Morbid obesity (HCC)   Current use of long term anticoagulation   Atrial fibrillation (HCC)   1. Left lower extremity cellulitis. Patient had a fever and leukocytosis on admission. The most obvious source was the LLE cellulitis. I do not believe he was septic as he was not tachycardic or hypotensive and his lactic acid level was within normal limits. -Blood cultures were ordered in the ED. Zosyn and vancomycin were started. We'll continue current management.  Chronic lower extremity venous stasis changes with ulcerations. The patient is followed by the wound care clinic and is treated with what appeared compression dressings. -Wound Care consult was ordered. We'll follow the recommendations regarding dressing changes.  Chronic atrial fibrillation. The patient is treated with verapamil for rate control and Pradaxa for anticoagulation. Pradaxa was continued. Verapamil was held initially due to low-normal blood pressures. -As his blood pressure improves, will restart the verapamil. -His TSH was within  normal limits.  Hypertension with hypovolemia. Patient is treated chronically with lisinopril and verapamil. Both were placed on hold due to low-normal blood pressures and hypovolemia. As his blood pressure improves, they will be restarted. -We'll continue IV fluids as started.  Type II insulin requiring diabetes. Patient is treated chronically with Degludec insulin, and for conduct, metformin, and Actos. These meds/insulin were withheld on admission. Sliding scale NovoLog started. -We'll continue to monitor and restart chronic medications as his blood glucose increases. Currently stable and controlled on sliding-scale NovoLog.   DVT prophylaxis: Pradaxa Code Status: Full code Family Communication: Family not available Disposition Plan: Discharge on clinically appropriate   Consultants:   Wound care  Procedures:   None  Antimicrobials:   Vancomycin 11/28/15  Zosyn 11/28/15    Subjective: Patient has no chest pain or shortness of breath. He has no leg pain. He is concerned about dressing changes.  Objective: Vitals:   11/28/15 0300 11/28/15 0400 11/28/15 0500 11/28/15 0725  BP:  (!) 92/52    Pulse: (!) 113 (!) 113 (!) 113   Resp: (!) 35     Temp:  98.2 F (36.8 C)  97.4 F (36.3 C)  TempSrc:  Oral  Oral  SpO2: (!) 89% 92% 96%   Weight:   (!) 173.3 kg (382 lb 0.9 oz)   Height:        Intake/Output Summary (Last 24 hours) at 11/28/15 1031 Last data filed at 11/28/15 0816  Gross per 24 hour  Intake           4462.5 ml  Output  1125 ml  Net           3337.5 ml   Filed Weights   11/27/15 1824 11/28/15 0000 11/28/15 0500  Weight: (!) 145.2 kg (320 lb) (!) 173.1 kg (381 lb 9.9 oz) (!) 173.3 kg (382 lb 0.9 oz)    Examination:  General exam: Appears calm and comfortable  Respiratory system: Clear to auscultation anteriorly with decreased breath sounds in the bases. Respiratory effort normal. Cardiovascular system: Irregular, irregular, with borderline  tachycardia. 1+ nonpitting lower extremity edema bilaterally. Gastrointestinal system: Abdomen is obese, nondistended, soft and nontender. No organomegaly or masses felt. Normal bowel sounds heard. Central nervous system: Alert and oriented. No focal neurological deficits. Extremities: Symmetric 5 x 5 power. Skin: Dressings on both legs below the knee (will examine later). Left thigh with excoriated erythema. Psychiatry: Judgement and insight appear normal. Mood & affect appropriate.     Data Reviewed: I have personally reviewed following labs and imaging studies  CBC:  Recent Labs Lab 11/27/15 1840 11/27/15 1853 11/28/15 0434  WBC 19.6*  --  18.2*  NEUTROABS 18.1*  --   --   HGB 12.9* 13.9 12.1*  HCT 40.7 41.0 38.8*  MCV 92.7  --  93.7  PLT 212  --  209   Basic Metabolic Panel:  Recent Labs Lab 11/27/15 1840 11/27/15 1853 11/28/15 0434  NA 137 140 139  K 4.2 4.4 4.0  CL 107 101 107  CO2 25  --  26  GLUCOSE 114* 117* 88  BUN 24* 24* 25*  CREATININE 1.11 1.10 1.08  CALCIUM 8.7*  --  8.5*   GFR: Estimated Creatinine Clearance: 105.6 mL/min (by C-G formula based on SCr of 1.08 mg/dL). Liver Function Tests:  Recent Labs Lab 11/27/15 1840 11/28/15 0434  AST 20 21  ALT 15* 15*  ALKPHOS 64 61  BILITOT 1.9* 1.6*  PROT 6.9 6.6  ALBUMIN 3.3* 3.1*   No results for input(s): LIPASE, AMYLASE in the last 168 hours. No results for input(s): AMMONIA in the last 168 hours. Coagulation Profile: No results for input(s): INR, PROTIME in the last 168 hours. Cardiac Enzymes: No results for input(s): CKTOTAL, CKMB, CKMBINDEX, TROPONINI in the last 168 hours. BNP (last 3 results) No results for input(s): PROBNP in the last 8760 hours. HbA1C: No results for input(s): HGBA1C in the last 72 hours. CBG:  Recent Labs Lab 11/28/15 0014 11/28/15 0442 11/28/15 0724 11/28/15 0813  GLUCAP 150* 126* 69 73   Lipid Profile: No results for input(s): CHOL, HDL, LDLCALC, TRIG,  CHOLHDL, LDLDIRECT in the last 72 hours. Thyroid Function Tests:  Recent Labs  11/28/15 0434  TSH 0.787   Anemia Panel: No results for input(s): VITAMINB12, FOLATE, FERRITIN, TIBC, IRON, RETICCTPCT in the last 72 hours. Sepsis Labs:  Recent Labs Lab 11/27/15 1853  LATICACIDVEN 1.17    Recent Results (from the past 240 hour(s))  MRSA PCR Screening     Status: None   Collection Time: 11/27/15 11:44 PM  Result Value Ref Range Status   MRSA by PCR NEGATIVE NEGATIVE Final    Comment:        The GeneXpert MRSA Assay (FDA approved for NASAL specimens only), is one component of a comprehensive MRSA colonization surveillance program. It is not intended to diagnose MRSA infection nor to guide or monitor treatment for MRSA infections.          Radiology Studies: Dg Chest Port 1 View  Result Date: 11/27/2015 CLINICAL DATA:  70 year old male  with weakness and presyncope. Possible hypoglycemia. Shortness of breath and fever. Initial encounter. EXAM: PORTABLE CHEST 1 VIEW COMPARISON:  03/15/2014 portable chest and earlier. FINDINGS: Portable AP supine view at 1854 hours. Large body habitus. Stable cardiomegaly and mediastinal contours. Pulmonary vascularity appears stable. No overt edema. Otherwise when Allowing for portable technique the lungs are clear. Visualized tracheal air column is within normal limits. No pneumothorax or pleural effusion. IMPRESSION: No acute cardiopulmonary abnormality. Electronically Signed   By: Odessa Fleming M.D.   On: 11/27/2015 19:09        Scheduled Meds: . aspirin EC  81 mg Oral QPM  . dabigatran  150 mg Oral Q12H  . insulin aspart  0-20 Units Subcutaneous Q4H  . piperacillin-tazobactam (ZOSYN)  IV  3.375 g Intravenous Q8H  . pravastatin  20 mg Oral Daily  . sodium chloride flush  3 mL Intravenous Q12H  . vancomycin  1,750 mg Intravenous Q24H   Continuous Infusions: . sodium chloride 75 mL/hr at 11/28/15 0030     LOS: 1 day    Time spent:  35 minutes    [name], MD [facility] Pager [phone]   If 7PM-7AM, please contact night-coverage [email or web address] Password [email or web address] 11/28/2015, 10:31 AM

## 2015-11-28 NOTE — Consult Note (Signed)
WOC Nurse wound consult note Reason for Consult: Intertriginous dermatitis in the sub-pannicular area, chronic venous insufficiency/lymphedema.  [facility] by Dr. Joanne Gaveloy Arkin.  Uses one lymphedema pump (RLE only) at home.  Wife and daughter are independent in applying Unna's Boots, however, they do not apply from metatarsal head to knee with heel inclusive.  That said, the patient has no open wounds today and only partial thickness areas of skin loss where his family has scratch him with scissors during boot removal. There is a full thickness wound on the medial right thigh draining moderate amounts of serous exudate. Dr. Sherrie MustacheFisher in to see wounds and LEs during my visit today. Wound type:venous insufficiency/lymphedema, moisture Pressure Ulcer POA:No Measurement:Right medial thigh:  3cm round with depth unable to be determined due to the presence of yellow slough obscuring the wound bed. Sub-pannicular dermatitis: erythema the entire length of panus.  No open wounds. Wound bed:As desribed above. Drainage (amount, consistency, odor) As described above. Periwound: macerated in the sub-pannicular area with evidence of poor hygiene. Dressing procedure/placement/frequency: I removed incorrectly applied Unna's Boots and washed and dried the LEs, applying moisturizer while awaiting Dr. Theodis AguasFisher's arrival.  Following her assessment, I applied Unna's Boots with calamine from the metatarsal head to the knee (heel inclusive) bilaterally using a spiral wrapping technique with 50% overlap.  These are topped  With a 3-inch self-adherent wrap applied in a similar manner.  It took 2 Unna's Boots and 3, 3-inch self adherent wraps to cover the LEs. The open wound on the medial right thigh is dressed with white petrolatum. Our house antimicrobial textile is ordered for the sub-pannicular skin fold to assist with moisture management. The next Unna's Boot is due to be  changed on Thursday, August 24th.  If patient is not in house, family will do. If he is still an inpatient, one of the WOC Nurses will apply. WOC nursing team will follow, and will remain available to this patient, the nursing and medical teams.  Thanks, Ladona MowLaurie Brenson Hartman, MSN, RN, GNP, Hans EdenCWOCN, CWON-AP, FAAN  Pager# [phone]

## 2015-11-29 DIAGNOSIS — E119 Type 2 diabetes mellitus without complications: Secondary | ICD-10-CM

## 2015-11-29 LAB — CBC
HCT: 39.2 % (ref 39.0–52.0)
Hemoglobin: 12.1 g/dL — ABNORMAL LOW (ref 13.0–17.0)
MCH: 29.2 pg (ref 26.0–34.0)
MCHC: 30.9 g/dL (ref 30.0–36.0)
MCV: 94.7 fL (ref 78.0–100.0)
PLATELETS: 194 10*3/uL (ref 150–400)
RBC: 4.14 MIL/uL — ABNORMAL LOW (ref 4.22–5.81)
RDW: 14.8 % (ref 11.5–15.5)
WBC: 11.7 10*3/uL — ABNORMAL HIGH (ref 4.0–10.5)

## 2015-11-29 LAB — BASIC METABOLIC PANEL
Anion gap: 5 (ref 5–15)
BUN: 24 mg/dL — AB (ref 6–20)
CHLORIDE: 106 mmol/L (ref 101–111)
CO2: 26 mmol/L (ref 22–32)
CREATININE: 0.92 mg/dL (ref 0.61–1.24)
Calcium: 8.2 mg/dL — ABNORMAL LOW (ref 8.9–10.3)
GFR calc Af Amer: >60 mL/min (ref >60)
GFR calc non Af Amer: >60 mL/min (ref >60)
Glucose, Bld: 83 mg/dL (ref 65–99)
Potassium: 3.8 mmol/L (ref 3.5–5.1)
SODIUM: 137 mmol/L (ref 135–145)

## 2015-11-29 LAB — GLUCOSE, CAPILLARY
GLUCOSE-CAPILLARY: 86 mg/dL (ref 65–99)
GLUCOSE-CAPILLARY: 141 mg/dL — AB (ref 65–99)
Glucose-Capillary: 102 mg/dL — ABNORMAL HIGH (ref 65–99)
Glucose-Capillary: 103 mg/dL — ABNORMAL HIGH (ref 65–99)
Glucose-Capillary: 110 mg/dL — ABNORMAL HIGH (ref 65–99)
Glucose-Capillary: 79 mg/dL (ref 65–99)
Glucose-Capillary: 89 mg/dL (ref 65–99)

## 2015-11-29 MED ORDER — VERAPAMIL HCL ER 240 MG PO TBCR
240.0000 mg | EXTENDED_RELEASE_TABLET | Freq: Every day | ORAL | Status: DC
  Administered 2015-11-29 – 2015-12-01 (×3): 240 mg via ORAL
  Filled 2015-11-29 (×3): qty 1

## 2015-11-29 MED ORDER — POTASSIUM CHLORIDE IN NACL 20-0.9 MEQ/L-% IV SOLN
INTRAVENOUS | Status: AC
  Administered 2015-11-29: 1000 mL via INTRAVENOUS

## 2015-11-29 MED ORDER — VERAPAMIL HCL ER 240 MG PO TBCR
120.0000 mg | EXTENDED_RELEASE_TABLET | Freq: Every day | ORAL | Status: DC
  Administered 2015-11-29 – 2015-11-30 (×2): 120 mg via ORAL
  Filled 2015-11-29 (×2): qty 1

## 2015-11-29 MED ORDER — DILTIAZEM HCL 100 MG IV SOLR
5.0000 mg/h | INTRAVENOUS | Status: DC
  Administered 2015-11-29 (×2): 5 mg/h via INTRAVENOUS
  Filled 2015-11-29 (×2): qty 100

## 2015-11-29 NOTE — Progress Notes (Signed)
PROGRESS NOTE    Jonathan HibbsRobert L Wise  [id number] DOB: 02-28-1946 DOA: 11/27/2015 PCP: [name],[name], MD    Brief Narrative:  Patient is a 70 year old man with chronic A. fib on Pradaxa, morbid obesity, DM, and chronic lower extremity venous stasis who presented to the ED on 11/27/15 with a complaint of generalized weakness, fever, and worsening redness of his left leg. In the ED, he was febrile with a temperature of 100 and hemodynamically stable, although his blood pressure was in the low 90s systolically. His white blood cell count was elevated at 19.6. His lactic acid was within normal limits. His UA revealed many bacteria but no WBCs. His CXR revealed no acute disease. He was admitted for management of LLE cellulitis.    Assessment & Plan:   Principal Problem:   Cellulitis of leg, left Active Problems:   Venous stasis ulcers (HCC)   Hypovolemia   Type 2 diabetes mellitus without complication (HCC)   Morbid obesity (HCC)   Current use of long term anticoagulation   Atrial fibrillation (HCC)   1. Left lower extremity cellulitis. Patient had a fever and leukocytosis on admission. The most obvious source was the LLE cellulitis. I do not believe he was septic as he was not tachycardic or hypotensive and his lactic acid level was within normal limits. -Blood cultures were ordered in the ED. Zosyn and vancomycin were started. -Blood cultures are negative to date. - We'll continue current management.  Chronic lower extremity venous stasis changes with history of ulcerations. The patient is followed by the wound care clinic and is treated with what appeared compression dressings. -Wound Care consult was ordered. Patient's legs were examined with Mrs. McNicol on 11/28/15. There were no open lower leg ulcerations. Dressing recommendations noted and appreciated.  Chronic atrial fibrillation. The patient is treated with verapamil for rate control and Pradaxa for anticoagulation. Pradaxa  was continued. Verapamil was held initially due to low-normal blood pressures. Patient developed rapid atrial fibrillation, likely secondary to being off of verapamil. Diltiazem drip was started. -We'll restart verapamil and try to wean off diltiazem drip. -His TSH was within normal limits.  Hypertension with hypovolemia. Patient is treated chronically with lisinopril and verapamil. Both were placed on hold due to low-normal blood pressures and hypovolemia. He was started on IV fluids. His blood pressure has improved. Verapamil restarted on 8/21 primarily for rate control. -We'll continue IV fluids as started.  Type II insulin requiring diabetes. Patient is treated chronically with Degludec insulin, and for conduct, metformin, and Actos. These meds/insulin were withheld on admission. Sliding scale NovoLog started. -We'll continue to monitor and restart chronic medications as his blood glucose increases. Currently stable and controlled on sliding-scale NovoLog. -Hemoglobin A1c pending.   DVT prophylaxis: Pradaxa Code Status: Full code Family Communication: Family not available Disposition Plan: Discharge on clinically appropriate   Consultants:   Wound care  Procedures:   None  Antimicrobials:   Vancomycin 11/28/15  Zosyn 11/28/15    Subjective: Patient was noted to be in rapid atrial fibrillation early this morning, but the patient denied chest pain.  Objective: Vitals:   11/29/15 0600 11/29/15 0700 11/29/15 0800 11/29/15 0802  BP: (!) 109/59 (!) 110/54 105/68   Pulse: 98 (!) 144 (!) 101   Resp: (!) 23 (!) 27 14   Temp:    97.3 F (36.3 C)  TempSrc:    Oral  SpO2: 97% 95% 99%   Weight:      Height:  Intake/Output Summary (Last 24 hours) at 11/29/15 1118 Last data filed at 11/29/15 0918  Gross per 24 hour  Intake              480 ml  Output             1750 ml  Net            -1270 ml   Filed Weights   11/28/15 0000 11/28/15 0500 11/29/15 0205  Weight:  (!) 173.1 kg (381 lb 9.9 oz) (!) 173.3 kg (382 lb 0.9 oz) (!) 174.6 kg (384 lb 14.8 oz)    Examination:  General exam: Appears calm and comfortable  Respiratory system: Clear to auscultation anteriorly with decreased breath sounds in the bases. Respiratory effort normal. Cardiovascular system: Irregular, irregular, with borderline tachycardia. 1+ nonpitting lower extremity edema bilaterally. Gastrointestinal system: Abdomen is obese, nondistended, soft and nontender. No organomegaly or masses felt. Normal bowel sounds heard. Central nervous system: Alert and oriented. No focal neurological deficits. Extremities: Symmetric 5 x 5 power. Skin:  Left thigh with excoriated erythema. (Legs examined with the bandages off on 11/28/15-mild erythema on the lower legs below the knees without ulcerations; mild erythema over the dorsal surfaces of both feet; no drainage). Psychiatry: Judgement and insight appear normal. Mood & affect appropriate.     Data Reviewed: I have personally reviewed following labs and imaging studies  CBC:  Recent Labs Lab 11/27/15 1840 11/27/15 1853 11/28/15 0434 11/29/15 0442  WBC 19.6*  --  18.2* 11.7*  NEUTROABS 18.1*  --   --   --   HGB 12.9* 13.9 12.1* 12.1*  HCT 40.7 41.0 38.8* 39.2  MCV 92.7  --  93.7 94.7  PLT 212  --  209 194   Basic Metabolic Panel:  Recent Labs Lab 11/27/15 1840 11/27/15 1853 11/28/15 0434 11/29/15 0442  NA 137 140 139 137  K 4.2 4.4 4.0 3.8  CL 107 101 107 106  CO2 25  --  26 26  GLUCOSE 114* 117* 88 83  BUN 24* 24* 25* 24*  CREATININE 1.11 1.10 1.08 0.92  CALCIUM 8.7*  --  8.5* 8.2*   GFR: Estimated Creatinine Clearance: 123 mL/min (by C-G formula based on SCr of 0.92 mg/dL). Liver Function Tests:  Recent Labs Lab 11/27/15 1840 11/28/15 0434  AST 20 21  ALT 15* 15*  ALKPHOS 64 61  BILITOT 1.9* 1.6*  PROT 6.9 6.6  ALBUMIN 3.3* 3.1*   No results for input(s): LIPASE, AMYLASE in the last 168 hours. No results  for input(s): AMMONIA in the last 168 hours. Coagulation Profile: No results for input(s): INR, PROTIME in the last 168 hours. Cardiac Enzymes: No results for input(s): CKTOTAL, CKMB, CKMBINDEX, TROPONINI in the last 168 hours. BNP (last 3 results) No results for input(s): PROBNP in the last 8760 hours. HbA1C: No results for input(s): HGBA1C in the last 72 hours. CBG:  Recent Labs Lab 11/28/15 1616 11/28/15 2003 11/29/15 0014 11/29/15 0449 11/29/15 0746  GLUCAP 86 100* 103* 86 79   Lipid Profile: No results for input(s): CHOL, HDL, LDLCALC, TRIG, CHOLHDL, LDLDIRECT in the last 72 hours. Thyroid Function Tests:  Recent Labs  11/28/15 0434  TSH 0.787   Anemia Panel: No results for input(s): VITAMINB12, FOLATE, FERRITIN, TIBC, IRON, RETICCTPCT in the last 72 hours. Sepsis Labs:  Recent Labs Lab 11/27/15 1853  LATICACIDVEN 1.17    Recent Results (from the past 240 hour(s))  Blood Culture (routine x 2)  Status: None (Preliminary result)   Collection Time: 11/27/15  7:34 PM  Result Value Ref Range Status   Specimen Description BLOOD  Final   Special Requests NONE  Final   Culture NO GROWTH 2 DAYS  Final   Report Status PENDING  Incomplete  Blood Culture (routine x 2)     Status: None (Preliminary result)   Collection Time: 11/27/15  7:44 PM  Result Value Ref Range Status   Specimen Description BLOOD  Final   Special Requests NONE  Final   Culture NO GROWTH 2 DAYS  Final   Report Status PENDING  Incomplete  MRSA PCR Screening     Status: None   Collection Time: 11/27/15 11:44 PM  Result Value Ref Range Status   MRSA by PCR NEGATIVE NEGATIVE Final    Comment:        The GeneXpert MRSA Assay (FDA approved for NASAL specimens only), is one component of a comprehensive MRSA colonization surveillance program. It is not intended to diagnose MRSA infection nor to guide or monitor treatment for MRSA infections.          Radiology Studies: Dg Chest Port  1 View  Result Date: 11/27/2015 CLINICAL DATA:  70 year old male with weakness and presyncope. Possible hypoglycemia. Shortness of breath and fever. Initial encounter. EXAM: PORTABLE CHEST 1 VIEW COMPARISON:  03/15/2014 portable chest and earlier. FINDINGS: Portable AP supine view at 1854 hours. Large body habitus. Stable cardiomegaly and mediastinal contours. Pulmonary vascularity appears stable. No overt edema. Otherwise when Allowing for portable technique the lungs are clear. Visualized tracheal air column is within normal limits. No pneumothorax or pleural effusion. IMPRESSION: No acute cardiopulmonary abnormality. Electronically Signed   By: Odessa Fleming M.D.   On: 11/27/2015 19:09        Scheduled Meds: . aspirin EC  81 mg Oral QPM  . dabigatran  150 mg Oral Q12H  . insulin aspart  0-20 Units Subcutaneous Q4H  . piperacillin-tazobactam (ZOSYN)  IV  3.375 g Intravenous Q8H  . pravastatin  20 mg Oral Daily  . sodium chloride flush  3 mL Intravenous Q12H  . vancomycin  1,750 mg Intravenous Q24H   Continuous Infusions: . sodium chloride 75 mL/hr at 11/28/15 0030  . diltiazem (CARDIZEM) infusion 5 mg/hr (11/29/15 1059)     LOS: 2 days    Time spent: 30 minutes    [name], MD [facility] Pager [phone]   If 7PM-7AM, please contact night-coverage [email or web address] Password [email or web address] 11/29/2015, 11:18 AM

## 2015-11-29 NOTE — Progress Notes (Signed)
Pt's BP 152/72, HR sustaining at 130-140.  Dr notified and responded by ordering a transfer to ICU.  Pt prepared and sent down after bed was chosen and report given to ICU nurse.  Vitals -T99.6  BP116/67  P110  R18   91%/Room Air.

## 2015-11-29 NOTE — Progress Notes (Signed)
eLink Physician-Brief Progress Note Patient Name: Jonathan HibbsRobert L Wise DOB: 11-15-1945 MRN: [id number]   Date of Service  11/29/2015  HPI/Events of Note  Chr AF, adm for LLE cellulitis, tr to ICU for AF-RVR - for cardizem gtt  eICU Interventions  monitor     Intervention Category Evaluation Type: New Patient Evaluation  [name] V. 11/29/2015, 2:37 AM

## 2015-11-30 LAB — GLUCOSE, CAPILLARY
GLUCOSE-CAPILLARY: 111 mg/dL — AB (ref 65–99)
Glucose-Capillary: 105 mg/dL — ABNORMAL HIGH (ref 65–99)
Glucose-Capillary: 92 mg/dL (ref 65–99)
Glucose-Capillary: 121 mg/dL — ABNORMAL HIGH (ref 65–99)
Glucose-Capillary: 153 mg/dL — ABNORMAL HIGH (ref 65–99)

## 2015-11-30 LAB — VANCOMYCIN, TROUGH: VANCOMYCIN TR: 11 ug/mL — AB (ref 15–20)

## 2015-11-30 LAB — BASIC METABOLIC PANEL
Anion gap: 7 (ref 5–15)
BUN: 22 mg/dL — AB (ref 6–20)
CHLORIDE: 105 mmol/L (ref 101–111)
CO2: 26 mmol/L (ref 22–32)
CREATININE: 0.93 mg/dL (ref 0.61–1.24)
Calcium: 8.6 mg/dL — ABNORMAL LOW (ref 8.9–10.3)
GFR calc non Af Amer: >60 mL/min (ref >60)
Glucose, Bld: 114 mg/dL — ABNORMAL HIGH (ref 65–99)
POTASSIUM: 4.2 mmol/L (ref 3.5–5.1)
Sodium: 138 mmol/L (ref 135–145)

## 2015-11-30 LAB — URINE CULTURE: CULTURE: NO GROWTH

## 2015-11-30 LAB — CBC
HEMATOCRIT: 43.7 % (ref 39.0–52.0)
Hemoglobin: 13.4 g/dL (ref 13.0–17.0)
MCH: 29.5 pg (ref 26.0–34.0)
MCHC: 30.7 g/dL (ref 30.0–36.0)
MCV: 96.3 fL (ref 78.0–100.0)
PLATELETS: 233 10*3/uL (ref 150–400)
RBC: 4.54 MIL/uL (ref 4.22–5.81)
RDW: 14.9 % (ref 11.5–15.5)
WBC: 9.8 10*3/uL (ref 4.0–10.5)

## 2015-11-30 LAB — HEMOGLOBIN A1C
Hgb A1c MFr Bld: 5.7 % — ABNORMAL HIGH (ref 4.8–5.6)
Mean Plasma Glucose: 117 mg/dL

## 2015-11-30 MED ORDER — DIGOXIN 0.25 MG/ML IJ SOLN
0.1250 mg | Freq: Once | INTRAMUSCULAR | Status: AC
  Administered 2015-11-30: 0.125 mg via INTRAVENOUS
  Filled 2015-11-30: qty 2

## 2015-11-30 MED ORDER — METOPROLOL TARTRATE 25 MG PO TABS
12.5000 mg | ORAL_TABLET | Freq: Three times a day (TID) | ORAL | Status: DC
  Administered 2015-11-30 – 2015-12-01 (×4): 12.5 mg via ORAL
  Filled 2015-11-30 (×3): qty 1

## 2015-11-30 NOTE — Care Management Note (Signed)
Case Management Note  Patient Details  Name: Jonathan HibbsRobert L Wise MRN: [id number] Date of Birth: 28-Apr-1945  Subjective/Objective:                  Admitted with cellulitis and a-fib with RVR. Pt is from home, lives with his wife and has a granddaughter who is moving in this weekend.  He is ind with ADL's. He has PCP, Dr. Benedetto GoadFred Wilson, He drives himself to appointments and has insurance with drug coverage and no difficulty affording medications. 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 He plans to return home with self care at DC. He is anxious to get out of bed and walking.   Action/Plan: No CM needs anticipated.   Expected Discharge Date:      12/01/2015            Expected Discharge Plan:  Home/Self Care  In-House Referral:  NA  Discharge planning Services  CM Consult  Post Acute Care Choice:  NA Choice offered to:  NA  DME Arranged:    DME Agency:     HH Arranged:    HH Agency:     Status of Service:  Completed, signed off  If discussed at [employer] Length of Stay Meetings, dates discussed:    Additional Comments:  Jonathan Wise, Jonathan Calo Demske, RN 11/30/2015, 7:42 AM

## 2015-11-30 NOTE — Progress Notes (Signed)
PROGRESS NOTE    Jonathan Wise  ZOX:[id number] DOB: 04-26-1945 DOA: 11/27/2015 PCP: Pamelia Hoit, MD    Brief Narrative:  Patient is a 70 year old man with chronic A. fib on Pradaxa, morbid obesity, DM, and chronic lower extremity venous stasis who presented to the ED on 11/27/15 with a complaint of generalized weakness, fever, and worsening redness of his left leg. In the ED, he was febrile with a temperature of 100 and hemodynamically stable, although his blood pressure was in the low 90s systolically. His white blood cell count was elevated at 19.6. His lactic acid was within normal limits. His UA revealed many bacteria but no WBCs. His CXR revealed no acute disease. He was admitted for management of LLE cellulitis.    Assessment & Plan:   Principal Problem:   Cellulitis of leg, left Active Problems:   Venous stasis ulcers (HCC)   Hypovolemia   Type 2 diabetes mellitus without complication (HCC)   Morbid obesity (HCC)   Current use of long term anticoagulation   Atrial fibrillation (HCC)   1. Left lower extremity cellulitis. Patient had a fever and leukocytosis on admission. The most obvious source was the LLE cellulitis. I do not believe he was septic as he was not tachycardic or hypotensive and his lactic acid level was within normal limits. -Blood cultures were ordered in the ED. Zosyn and vancomycin were started. -Blood cultures are negative to date. - His white blood cell count has improved. We'll discontinue Zosyn and continue vancomycin.  Chronic lower extremity venous stasis changes with history of ulcerations. The patient is followed by the wound care clinic and is treated with what appeared compression dressings. -Wound Care consult was ordered. Patient's legs were examined with Mrs. McNicol on 11/28/15. There were no open lower leg ulcerations. Dressing recommendations noted and appreciated.  Chronic atrial fibrillation with RVR. The patient is treated with  verapamil for rate control and Pradaxa for anticoagulation. Pradaxa was continued. Verapamil was held initially due to low-normal blood pressures. Patient developed rapid atrial fibrillation, likely secondary to being off of verapamil. Diltiazem drip was started. -The diltiazem drip was discontinued and he was restarted on verapamil with apparently good heart rates. -Overnight, he developed A. fib with RVR again. His heart rate is ranging from normal up to 120s and occasionally to the 140s. -We'll add 3 times a day dosing of metoprolol. We'll give 1 dose of IV digoxin. -His TSH is within normal limits.  Hypertension with hypovolemia. Patient is treated chronically with lisinopril and verapamil. Both were placed on hold due to low-normal blood pressures and hypovolemia. He was started on IV fluids. His blood pressure has improved. Verapamil restarted on 8/21 primarily for rate control. -We'll continue gentle IV fluids.  Type II insulin requiring diabetes. Patient is treated chronically with Degludec insulin, and for conduct, metformin, and Actos. These meds/insulin were withheld on admission. Sliding scale NovoLog started. -We'll continue to monitor and restart chronic medications as his blood glucose increases. Currently stable and controlled on sliding-scale NovoLog. -Hemoglobin A1c was 5.7. -At the time of discharge, he probably does not need as many oral hypoglycemic agents echo probably be taken off of Actos.   DVT prophylaxis: Pradaxa Code Status: Full code Family Communication: Family not available Disposition Plan: Discharge on clinically appropriate   Consultants:   Wound care  Procedures:   None  Antimicrobials:   Vancomycin 11/28/15>>  Zosyn 11/28/15 >> 11/30/15   Subjective: Patient denies chest pain or chest palpitations.  Objective:  Vitals:   11/30/15 0700 11/30/15 0735 11/30/15 0800 11/30/15 1127  BP: (!) 110/54     Pulse: 95  (!) 142   Resp: (!) 24  (!) 25     Temp:  97.8 F (36.6 C)  98.8 F (37.1 C)  TempSrc:  Oral  Oral  SpO2: 99%  93%   Weight:      Height:      On telemetry, his heart rate is in the 1:15 to 121 range. Blood pressure 126/81 in the room.  Intake/Output Summary (Last 24 hours) at 11/30/15 1328 Last data filed at 11/30/15 1128  Gross per 24 hour  Intake             3185 ml  Output              770 ml  Net             2415 ml   Filed Weights   11/28/15 0500 11/29/15 0205 11/30/15 0400  Weight: (!) 173.3 kg (382 lb 0.9 oz) (!) 174.6 kg (384 lb 14.8 oz) (!) 176.4 kg (388 lb 14.3 oz)    Examination:  General exam: Appears calm and comfortable  Respiratory system: Clear to auscultation anteriorly with decreased breath sounds in the bases. Respiratory effort normal. Cardiovascular system: Irregular, irregular, with tachycardia. 1+ nonpitting lower extremity edema bilaterally. Gastrointestinal system: Abdomen is obese, nondistended, soft and nontender. No organomegaly or masses felt. Normal bowel sounds heard. Central nervous system: Alert and oriented. No focal neurological deficits. Extremities: Symmetric 5 x 5 power. Skin:  Left thigh with excoriated erythema. (Legs examined with the bandages off on 11/28/15-mild erythema on the lower legs below the knees without ulcerations; mild erythema over the dorsal surfaces of both feet; no drainage). Psychiatry: Judgement and insight appear normal. Mood & affect appropriate.     Data Reviewed: I have personally reviewed following labs and imaging studies  CBC:  Recent Labs Lab 11/27/15 1840 11/27/15 1853 11/28/15 0434 11/29/15 0442 11/30/15 0432  WBC 19.6*  --  18.2* 11.7* 9.8  NEUTROABS 18.1*  --   --   --   --   HGB 12.9* 13.9 12.1* 12.1* 13.4  HCT 40.7 41.0 38.8* 39.2 43.7  MCV 92.7  --  93.7 94.7 96.3  PLT 212  --  209 194 233   Basic Metabolic Panel:  Recent Labs Lab 11/27/15 1840 11/27/15 1853 11/28/15 0434 11/29/15 0442 11/30/15 0432  NA 137 140 139  137 138  K 4.2 4.4 4.0 3.8 4.2  CL 107 101 107 106 105  CO2 25  --  26 26 26   GLUCOSE 114* 117* 88 83 114*  BUN 24* 24* 25* 24* 22*  CREATININE 1.11 1.10 1.08 0.92 0.93  CALCIUM 8.7*  --  8.5* 8.2* 8.6*   GFR: Estimated Creatinine Clearance: 122.4 mL/min (by C-G formula based on SCr of 0.93 mg/dL). Liver Function Tests:  Recent Labs Lab 11/27/15 1840 11/28/15 0434  AST 20 21  ALT 15* 15*  ALKPHOS 64 61  BILITOT 1.9* 1.6*  PROT 6.9 6.6  ALBUMIN 3.3* 3.1*   No results for input(s): LIPASE, AMYLASE in the last 168 hours. No results for input(s): AMMONIA in the last 168 hours. Coagulation Profile: No results for input(s): INR, PROTIME in the last 168 hours. Cardiac Enzymes: No results for input(s): CKTOTAL, CKMB, CKMBINDEX, TROPONINI in the last 168 hours. BNP (last 3 results) No results for input(s): PROBNP in the last 8760 hours. HbA1C:  Recent Labs  11/29/15 0442  HGBA1C 5.7*   CBG:  Recent Labs Lab 11/29/15 1923 11/29/15 2351 11/30/15 0359 11/30/15 0716 11/30/15 1115  GLUCAP 141* 110* 105* 92 121*   Lipid Profile: No results for input(s): CHOL, HDL, LDLCALC, TRIG, CHOLHDL, LDLDIRECT in the last 72 hours. Thyroid Function Tests:  Recent Labs  11/28/15 0434  TSH 0.787   Anemia Panel: No results for input(s): VITAMINB12, FOLATE, FERRITIN, TIBC, IRON, RETICCTPCT in the last 72 hours. Sepsis Labs:  Recent Labs Lab 11/27/15 1853  LATICACIDVEN 1.17    Recent Results (from the past 240 hour(s))  Blood Culture (routine x 2)     Status: None (Preliminary result)   Collection Time: 11/27/15  7:34 PM  Result Value Ref Range Status   Specimen Description BLOOD  Final   Special Requests NONE  Final   Culture NO GROWTH 3 DAYS  Final   Report Status PENDING  Incomplete  Blood Culture (routine x 2)     Status: None (Preliminary result)   Collection Time: 11/27/15  7:44 PM  Result Value Ref Range Status   Specimen Description BLOOD  Final   Special  Requests NONE  Final   Culture NO GROWTH 3 DAYS  Final   Report Status PENDING  Incomplete  MRSA PCR Screening     Status: None   Collection Time: 11/27/15 11:44 PM  Result Value Ref Range Status   MRSA by PCR NEGATIVE NEGATIVE Final    Comment:        The GeneXpert MRSA Assay (FDA approved for NASAL specimens only), is one component of a comprehensive MRSA colonization surveillance program. It is not intended to diagnose MRSA infection nor to guide or monitor treatment for MRSA infections.          Radiology Studies: No results found.      Scheduled Meds: . aspirin EC  81 mg Oral QPM  . dabigatran  150 mg Oral Q12H  . digoxin  0.125 mg Intravenous Once  . insulin aspart  0-20 Units Subcutaneous Q4H  . metoprolol tartrate  12.5 mg Oral TID  . piperacillin-tazobactam (ZOSYN)  IV  3.375 g Intravenous Q8H  . pravastatin  20 mg Oral Daily  . sodium chloride flush  3 mL Intravenous Q12H  . vancomycin  1,750 mg Intravenous Q24H  . verapamil  120 mg Oral QHS  . verapamil  240 mg Oral Daily   Continuous Infusions: . 0.9 % NaCl with KCl 20 mEq / L 75 mL/hr at 11/30/15 0600  . diltiazem (CARDIZEM) infusion Stopped (11/29/15 1300)     LOS: 3 days    Time spent: 30 minutes    Elliot CousinFISHER,Renesme Kerrigan, MD [facility] Pager [phone]   If 7PM-7AM, please contact night-coverage [email or web address] Password [email or web address] 11/30/2015, 1:28 PM

## 2015-11-30 NOTE — Progress Notes (Signed)
ANTICOAGULATION CONSULT NOTE   Pharmacy Consult for dabigatran Indication: nonvalvular atrial fibrillation  Allergies  Allergen Reactions  . Furosemide Other (See Comments)    Dizziness , drowsiness    Patient Measurements: Height: 6' (182.9 cm) Weight: (!) 388 lb 14.3 oz (176.4 kg) IBW/kg (Calculated) : 77.6 HEPARIN DW (KG): 120.3   Vital Signs: Temp: 98.8 F (37.1 C) (08/22 1127) Temp Source: Oral (08/22 1127) BP: 110/54 (08/22 0700) Pulse Rate: 142 (08/22 0800)  Labs:  Recent Labs  11/27/15 1840  11/28/15 0434 11/29/15 0442 11/30/15 0432  HGB 12.9*  < > 12.1* 12.1* 13.4  HCT 40.7  < > 38.8* 39.2 43.7  PLT 212  --  209 194 233  APTT 38*  --   --   --   --   CREATININE 1.11  < > 1.08 0.92 0.93  < > = values in this interval not displayed. Estimated Creatinine Clearance: 122.4 mL/min (by C-G formula based on SCr of 0.93 mg/dL).  Medical History: Past Medical History:  Diagnosis Date  . Atrial fibrillation (HCC)    On Pradaxa  . Bilateral lower extremity edema   . DVT (deep venous thrombosis) (HCC) 1994   LLE. Completed tx with coumadin  . Hyperlipidemia   . Hypertension   . Kidney stones   . Morbid obesity (HCC)   . Pulmonary artery hypertension (HCC) 10/18/2011  . Type 2 diabetes mellitus (HCC)   . Urinary retention 10/15/2011  . Venous stasis ulcers (HCC)     Medications:  Scheduled:  . aspirin EC  81 mg Oral QPM  . dabigatran  150 mg Oral Q12H  . insulin aspart  0-20 Units Subcutaneous Q4H  . piperacillin-tazobactam (ZOSYN)  IV  3.375 g Intravenous Q8H  . pravastatin  20 mg Oral Daily  . sodium chloride flush  3 mL Intravenous Q12H  . vancomycin  1,750 mg Intravenous Q24H  . verapamil  120 mg Oral QHS  . verapamil  240 mg Oral Daily   Infusions:  . 0.9 % NaCl with KCl 20 mEq / L 75 mL/hr at 11/30/15 0600  . diltiazem (CARDIZEM) infusion Stopped (11/29/15 1300)   . aspirin EC  81 mg Oral QPM  . dabigatran  150 mg Oral Q12H  . insulin aspart   0-20 Units Subcutaneous Q4H  . piperacillin-tazobactam (ZOSYN)  IV  3.375 g Intravenous Q8H  . pravastatin  20 mg Oral Daily  . sodium chloride flush  3 mL Intravenous Q12H  . vancomycin  1,750 mg Intravenous Q24H  . verapamil  120 mg Oral QHS  . verapamil  240 mg Oral Daily   Assessment: 70 yo male with hx afib, obesity, HTN, and HLD. Starting dabigatran for nonvalvular a. Fib to reduce risk stroke, systemic embolism.  Plan:  Continue pradaxa 150 mg po bid Educate Mady GemmaHayes, Rachel Rison R, [facility] 11/30/2015,11:41 AM

## 2015-11-30 NOTE — Progress Notes (Signed)
Pharmacy Antibiotic Note  Jonathan Wise is a 70 y.o. male admitted on 11/27/2015 with sepsis.  Pharmacy has been consulted for vancomycin and zosyn dosing. Therapy continues for cellulitis.  Plan: Continue 1750 mg IV q24 hours Check trough this evening (Goal 10-15). Cont zosyn 3.375 g IV q8 hours F/u renal function, cultures and clinical course  Height: 6' (182.9 cm) Weight: (!) 388 lb 14.3 oz (176.4 kg) IBW/kg (Calculated) : 77.6  Temp (24hrs), Avg:98.1 F (36.7 C), Min:97.2 F (36.2 C), Max:98.8 F (37.1 C)   Recent Labs Lab 11/27/15 1840 11/27/15 1853 11/28/15 0434 11/29/15 0442 11/30/15 0432  WBC 19.6*  --  18.2* 11.7* 9.8  CREATININE 1.11 1.10 1.08 0.92 0.93  LATICACIDVEN  --  1.17  --   --   --     Estimated Creatinine Clearance: 122.4 mL/min (by C-G formula based on SCr of 0.93 mg/dL).    Allergies  Allergen Reactions  . Furosemide Other (See Comments)    Dizziness , drowsiness    Antimicrobials this admission: vanc 8/19 >>  zosyn 8/19 >>    Thank you for allowing pharmacy to be a part of this patient's care.  Mady GemmaHayes, Sagal Gayton R 11/30/2015 11:35 AM

## 2015-11-30 NOTE — Discharge Instructions (Signed)
Information on my medicine - Pradaxa (dabigatran)  This medication education was reviewed with me or my healthcare representative as part of my discharge preparation.  The pharmacist that spoke with me during my hospital stay was:  Mady GemmaHayes, Rosealyn Little R, [facility]  Why was Pradaxa prescribed for you? Pradaxa was prescribed for you to reduce the risk of forming blood clots that cause a stroke if you have a medical condition called atrial fibrillation (a type of irregular heartbeat).    What do you Need to know about PradAXa? Take your Pradaxa TWICE DAILY - one capsule in the morning and one tablet in the evening with or without food.  It would be best to take the doses about the same time each day.  The capsules should not be broken, chewed or opened - they must be swallowed whole.  Do not store Pradaxa in other medication containers - once the bottle is opened the Pradaxa should be used within FOUR months; throw away any capsules that havent been by that time.  Take Pradaxa exactly as prescribed by your doctor.  DO NOT stop taking Pradaxa without talking to the doctor who prescribed the medication.  Stopping without other stroke prevention medication to take the place of Pradaxa may increase your risk of developing a clot that causes a stroke.  Refill your prescription before you run out.  After discharge, you should have regular check-up appointments with your healthcare provider that is prescribing your Pradaxa.  In the future your dose may need to be changed if your kidney function or weight changes by a significant amount.  What do you do if you miss a dose? If you miss a dose, take it as soon as you remember on the same day.  If your next dose is less than 6 hours away, skip the missed dose.  Do not take two doses of PRADAXA at the same time.  Important Safety Information A possible side effect of Pradaxa is bleeding. You should call your healthcare provider right away if you experience any  of the following: ? Bleeding from an injury or your nose that does not stop. ? Unusual colored urine (red or dark brown) or unusual colored stools (red or black). ? Unusual bruising for unknown reasons. ? A serious fall or if you hit your head (even if there is no bleeding).  Some medicines may interact with Pradaxa and might increase your risk of bleeding or clotting while on Pradaxa. To help avoid this, consult your healthcare provider or pharmacist prior to using any new prescription or non-prescription medications, including herbals, vitamins, non-steroidal anti-inflammatory drugs (NSAIDs) and supplements.  This website has more information on Pradaxa (dabigatran): [email or web address]://www.pradaxa.com

## 2015-12-01 DIAGNOSIS — L03116 Cellulitis of left lower limb: Principal | ICD-10-CM

## 2015-12-01 LAB — BASIC METABOLIC PANEL
ANION GAP: 6 (ref 5–15)
BUN: 19 mg/dL (ref 6–20)
CALCIUM: 8.7 mg/dL — AB (ref 8.9–10.3)
CO2: 29 mmol/L (ref 22–32)
CREATININE: 0.74 mg/dL (ref 0.61–1.24)
Chloride: 104 mmol/L (ref 101–111)
GLUCOSE: 108 mg/dL — AB (ref 65–99)
Potassium: 4.4 mmol/L (ref 3.5–5.1)
Sodium: 139 mmol/L (ref 135–145)

## 2015-12-01 LAB — GLUCOSE, CAPILLARY
GLUCOSE-CAPILLARY: 126 mg/dL — AB (ref 65–99)
GLUCOSE-CAPILLARY: 128 mg/dL — AB (ref 65–99)
Glucose-Capillary: 101 mg/dL — ABNORMAL HIGH (ref 65–99)
Glucose-Capillary: 142 mg/dL — ABNORMAL HIGH (ref 65–99)

## 2015-12-01 LAB — CBC
HCT: 42.5 % (ref 39.0–52.0)
HEMOGLOBIN: 12.6 g/dL — AB (ref 13.0–17.0)
MCH: 28.9 pg (ref 26.0–34.0)
MCHC: 29.6 g/dL — AB (ref 30.0–36.0)
MCV: 97.5 fL (ref 78.0–100.0)
PLATELETS: 257 10*3/uL (ref 150–400)
RBC: 4.36 MIL/uL (ref 4.22–5.81)
RDW: 14.3 % (ref 11.5–15.5)
WBC: 10.8 10*3/uL — ABNORMAL HIGH (ref 4.0–10.5)

## 2015-12-01 MED ORDER — METOPROLOL TARTRATE 25 MG PO TABS: 12.5000 mg | ORAL_TABLET | Freq: Three times a day (TID) | ORAL | 2 refills | Status: DC

## 2015-12-01 MED ORDER — DABIGATRAN ETEXILATE MESYLATE 150 MG PO CAPS: 150.0000 mg | ORAL_CAPSULE | Freq: Two times a day (BID) | ORAL | 2 refills | Status: DC

## 2015-12-01 MED ORDER — POLYETHYLENE GLYCOL 3350 17 G PO PACK
17.0000 g | PACK | Freq: Every day | ORAL | Status: DC
  Administered 2015-12-01: 17 g via ORAL
  Filled 2015-12-01: qty 1

## 2015-12-01 MED ORDER — DOXYCYCLINE HYCLATE 100 MG PO CAPS: 100.0000 mg | ORAL_CAPSULE | Freq: Two times a day (BID) | ORAL | 0 refills | Status: DC

## 2015-12-01 NOTE — Progress Notes (Signed)
Pharmacy Antibiotic Note  Jonathan HibbsRobert L Wise is a 70 y.o. male admitted on 11/27/2015 with sepsis.  Pharmacy has been consulted for vancomycin and zosyn dosing. Therapy continues for cellulitis. Trough at goal.  Zosyn to be stopped per MD note.  Plan: Continue Vancomycin 1750 mg IV q24 hours F/u renal function, cultures and clinical course  Height: 6' (182.9 cm) Weight: (!) 387 lb 2 oz (175.6 kg) IBW/kg (Calculated) : 77.6  Temp (24hrs), Avg:98.5 F (36.9 C), Min:97.6 F (36.4 C), Max:98.8 F (37.1 C)   Recent Labs Lab 11/27/15 1840 11/27/15 1853 11/28/15 0434 11/29/15 0442 11/30/15 0432 11/30/15 1910 12/01/15 0420  WBC 19.6*  --  18.2* 11.7* 9.8  --  10.8*  CREATININE 1.11 1.10 1.08 0.92 0.93  --  0.74  LATICACIDVEN  --  1.17  --   --   --   --   --   VANCOTROUGH  --   --   --   --   --  11*  --     Estimated Creatinine Clearance: 141.9 mL/min (by C-G formula based on SCr of 0.8 mg/dL).    Allergies  Allergen Reactions  . Furosemide Other (See Comments)    Dizziness , drowsiness   Antimicrobials this admission: vanc 8/19 >>  zosyn 8/19 >> 8/23  Cultures: 8/19 MRSA pcr: (-) 8/19 BC: Pending 8/19 UC: (-)  Thank you for allowing pharmacy to be a part of this patient's care.  Jonathan Wise, Jonathan Wise 12/01/2015 8:15 AM

## 2015-12-01 NOTE — Discharge Summary (Signed)
Physician Discharge Summary  Jonathan HibbsRobert L Rayson RUE:[id number] DOB: 1946/02/26 DOA: 11/27/2015  PCP: [name],[name], MD  Admit date: 11/27/2015 Discharge date: 12/01/2015  Time spent: 45 minutes  Recommendations for Outpatient Follow-up:  -Will be discharged home today, -Advised to follow up with PCP in 2 weeks. -Has 5 days of doxycycline to complete on DC.   Discharge Diagnoses:  Principal Problem:   Cellulitis of leg, left Active Problems:   Hypovolemia   Venous stasis ulcers (HCC)   Type 2 diabetes mellitus without complication (HCC)   Morbid obesity (HCC)   Current use of long term anticoagulation   Atrial fibrillation [facility](HCC)   Discharge Condition: Stable and improved  Filed Weights   11/29/15 0205 11/30/15 0400 12/01/15 0500  Weight: (!) 174.6 kg (384 lb 14.8 oz) (!) 176.4 kg (388 lb 14.3 oz) (!) 175.6 kg (387 lb 2 oz)    History of present illness:  As per Dr. Conley RollsLe on 8/19: Jonathan HibbsRobert L Wise is a 70 y.o. male with medical history significant of A-fib, morbid obesity, HTN, and HLD presents to the ED with complaint of fever and generalized weakness that onset earlier today. He denies vomiting and diarrhea, but he reports feeling weak since 1 am last night. He also admits to having a fever of 101. He has not ate anything since 3 pm today. Pt usually is able to ambulate with a walker but has not been able to ambulate as well for the past 2 days. He also reports erythema on his legs.  Review of chart showed that he has contemplated changing Pradaxa to Coumadin, however, this has not been accomplished.  For his DM, he has been on several oral meds, and has been on Treshiba (Deglunec insulin) for the past several months.   ED Course: While in the ED, pt was febrile, BUN 24, Glucose 117, lactic acid 1.17, WBC 19.6, and Hgb 13.9. CXR was unremarkable. EKG showed sinus rhythm. Pt was further admitted for sepsis management with suspicious source at least from  LLE  cellulitis.  Hospital Course:   . Left lower extremity cellulitis. Patient had a fever and leukocytosis on admission. The most obvious source was the LLE cellulitis. I do not believe he was septic as he was not tachycardic or hypotensive and his lactic acid level was within normal limits. -Blood cultures were ordered in the ED. Zosyn and vancomycin were started. -Blood cultures are negative to date. - His white blood cell count has improved.  -Doxy for 5 more days on DC.  Chronic lower extremity venous stasis changes with history of ulcerations. The patient is followed by the wound care clinic and is treated with compression dressings. -Wound Care consult was ordered. Patient's legs were examined with Mrs. McNicol on 11/28/15. There were no open lower leg ulcerations. Dressing recommendations noted and appreciated.  Chronic atrial fibrillation with RVR. The patient is treated with verapamil for rate control and Pradaxa for anticoagulation. Pradaxa was continued. Verapamil was held initially due to low-normal blood pressures. Patient developed rapid atrial fibrillation, likely secondary to being off of verapamil. Diltiazem drip was started. -The diltiazem drip was discontinued and he was restarted on verapamil with apparently good heart rates. -Overnight 8/21, he developed A. fib with RVR again.  -Metoprolol 12.5 mg TID was added, with HR improving into the 110s. -His TSH is within normal limits.  Hypertension with hypovolemia. Patient is treated chronically with lisinopril and verapamil. Both were placed on hold due to low-normal blood pressures  and hypovolemia. He was started on IV fluids. His blood pressure has improved. Verapamil restarted on 8/21 primarily for rate control. -We'll continue gentle IV fluids.  Type II insulin requiring diabetes. -Hemoglobin A1c was 5.7. -Rate controlled. -Continue home meds.  Procedures:  None   Consultations:  None  Discharge  Instructions  Discharge Instructions    Diet - low sodium heart healthy    Complete by:  As directed   Increase activity slowly    Complete by:  As directed      Allergies  Allergen Reactions  . Furosemide Other (See Comments)    Dizziness , drowsiness   Follow-up Information    [name],[name], MD. Schedule an appointment as soon as possible for a visit in 2 week(s).   Specialty:  Family Medicine Contact information: [address] [place] [address] [phone]            The results of significant diagnostics from this hospitalization (including imaging, microbiology, ancillary and laboratory) are listed below for reference.    Significant Diagnostic Studies: Dg Chest Port 1 View  Result Date: 11/27/2015 CLINICAL DATA:  70 year old male with weakness and presyncope. Possible hypoglycemia. Shortness of breath and fever. Initial encounter. EXAM: PORTABLE CHEST 1 VIEW COMPARISON:  03/15/2014 portable chest and earlier. FINDINGS: Portable AP supine view at 1854 hours. Large body habitus. Stable cardiomegaly and mediastinal contours. Pulmonary vascularity appears stable. No overt edema. Otherwise when Allowing for portable technique the lungs are clear. Visualized tracheal air column is within normal limits. No pneumothorax or pleural effusion. IMPRESSION: No acute cardiopulmonary abnormality. Electronically Signed   By: Odessa FlemingH  Hall M.D.   On: 11/27/2015 19:09    Microbiology: Recent Results (from the past 240 hour(s))  Blood Culture (routine x 2)     Status: None (Preliminary result)   Collection Time: 11/27/15  7:34 PM  Result Value Ref Range Status   Specimen Description BLOOD  Final   Special Requests NONE  Final   Culture NO GROWTH 3 DAYS  Final   Report Status PENDING  Incomplete  Blood Culture (routine x 2)     Status: None (Preliminary result)   Collection Time: 11/27/15  7:44 PM  Result Value Ref Range Status   Specimen Description BLOOD  Final   Special  Requests NONE  Final   Culture NO GROWTH 3 DAYS  Final   Report Status PENDING  Incomplete  Urine culture     Status: None   Collection Time: 11/27/15  9:07 PM  Result Value Ref Range Status   Specimen Description URINE, RANDOM  Final   Special Requests NONE  Final   Culture NO GROWTH Performed at [facility]   Final   Report Status 11/30/2015 FINAL  Final  MRSA PCR Screening     Status: None   Collection Time: 11/27/15 11:44 PM  Result Value Ref Range Status   MRSA by PCR NEGATIVE NEGATIVE Final    Comment:        The GeneXpert MRSA Assay (FDA approved for NASAL specimens only), is one component of a comprehensive MRSA colonization surveillance program. It is not intended to diagnose MRSA infection nor to guide or monitor treatment for MRSA infections.      Labs: Basic Metabolic Panel:  Recent Labs Lab 11/27/15 1840 11/27/15 1853 11/28/15 0434 11/29/15 0442 11/30/15 0432 12/01/15 0420  NA 137 140 139 137 138 139  K 4.2 4.4 4.0 3.8 4.2 4.4  CL 107 101 107  106 105 104  CO2 25  --  26 26 26 29   GLUCOSE 114* 117* 88 83 114* 108*  BUN 24* 24* 25* 24* 22* 19  CREATININE 1.11 1.10 1.08 0.92 0.93 0.74  CALCIUM 8.7*  --  8.5* 8.2* 8.6* 8.7*   Liver Function Tests:  Recent Labs Lab 11/27/15 1840 11/28/15 0434  AST 20 21  ALT 15* 15*  ALKPHOS 64 61  BILITOT 1.9* 1.6*  PROT 6.9 6.6  ALBUMIN 3.3* 3.1*   No results for input(s): LIPASE, AMYLASE in the last 168 hours. No results for input(s): AMMONIA in the last 168 hours. CBC:  Recent Labs Lab 11/27/15 1840 11/27/15 1853 11/28/15 0434 11/29/15 0442 11/30/15 0432 12/01/15 0420  WBC 19.6*  --  18.2* 11.7* 9.8 10.8*  NEUTROABS 18.1*  --   --   --   --   --   HGB 12.9* 13.9 12.1* 12.1* 13.4 12.6*  HCT 40.7 41.0 38.8* 39.2 43.7 42.5  MCV 92.7  --  93.7 94.7 96.3 97.5  PLT 212  --  209 194 233 257   Cardiac Enzymes: No results for input(s): CKTOTAL, CKMB, CKMBINDEX, TROPONINI in the last 168  hours. BNP: BNP (last 3 results) No results for input(s): BNP in the last 8760 hours.  ProBNP (last 3 results) No results for input(s): PROBNP in the last 8760 hours.  CBG:  Recent Labs Lab 11/30/15 1635 11/30/15 1934 12/01/15 0026 12/01/15 0358 12/01/15 0735  GLUCAP 111* 153* 126* 128* 101*       Signed:  Chaya Jan  [facility] Pager: [phone] 12/01/2015, 9:54 AM

## 2015-12-01 NOTE — Care Management Important Message (Signed)
Important Message  Patient Details  Name: Jonathan HibbsRobert L Wise MRN: [id number] Date of Birth: Feb 25, 1946   Medicare Important Message Given:  Yes    [name], Jonathan Bienvenue Demske, RN 12/01/2015, 10:28 AM

## 2015-12-02 ENCOUNTER — Ambulatory Visit: Payer: Medicare Other | Admitting: Orthopaedic Surgery

## 2015-12-02 LAB — CULTURE, BLOOD (ROUTINE X 2)
CULTURE: NO GROWTH
CULTURE: NO GROWTH

## 2015-12-15 ENCOUNTER — Ambulatory Visit: Payer: Medicare Other | Admitting: Orthopaedic Surgery

## 2015-12-21 ENCOUNTER — Inpatient Hospital Stay (HOSPITAL_COMMUNITY)
Admission: EM | Admit: 2015-12-21 | Discharge: 2015-12-27 | DRG: 603 | Disposition: A | Discharge status: Skilled Nursing Facility | Payer: Medicare Other | Attending: Internal Medicine | Admitting: Internal Medicine

## 2015-12-21 ENCOUNTER — Encounter (HOSPITAL_COMMUNITY): Payer: Self-pay | Admitting: Emergency Medicine

## 2015-12-21 DIAGNOSIS — I10 Essential (primary) hypertension: Secondary | ICD-10-CM | POA: Diagnosis present

## 2015-12-21 DIAGNOSIS — L0390 Cellulitis, unspecified: Secondary | ICD-10-CM | POA: Diagnosis present

## 2015-12-21 DIAGNOSIS — K5900 Constipation, unspecified: Secondary | ICD-10-CM | POA: Diagnosis present

## 2015-12-21 DIAGNOSIS — Z86718 Personal history of other venous thrombosis and embolism: Secondary | ICD-10-CM

## 2015-12-21 DIAGNOSIS — I8312 Varicose veins of left lower extremity with inflammation: Secondary | ICD-10-CM | POA: Diagnosis not present

## 2015-12-21 DIAGNOSIS — E119 Type 2 diabetes mellitus without complications: Secondary | ICD-10-CM | POA: Diagnosis present

## 2015-12-21 DIAGNOSIS — L03116 Cellulitis of left lower limb: Secondary | ICD-10-CM | POA: Diagnosis present

## 2015-12-21 DIAGNOSIS — D638 Anemia in other chronic diseases classified elsewhere: Secondary | ICD-10-CM | POA: Diagnosis present

## 2015-12-21 DIAGNOSIS — Z833 Family history of diabetes mellitus: Secondary | ICD-10-CM | POA: Diagnosis not present

## 2015-12-21 DIAGNOSIS — Z6841 Body Mass Index (BMI) 40.0 and over, adult: Secondary | ICD-10-CM

## 2015-12-21 DIAGNOSIS — L03115 Cellulitis of right lower limb: Secondary | ICD-10-CM | POA: Diagnosis present

## 2015-12-21 DIAGNOSIS — I4891 Unspecified atrial fibrillation: Secondary | ICD-10-CM | POA: Diagnosis present

## 2015-12-21 DIAGNOSIS — Z825 Family history of asthma and other chronic lower respiratory diseases: Secondary | ICD-10-CM

## 2015-12-21 DIAGNOSIS — I872 Venous insufficiency (chronic) (peripheral): Secondary | ICD-10-CM | POA: Diagnosis present

## 2015-12-21 DIAGNOSIS — I482 Chronic atrial fibrillation: Secondary | ICD-10-CM | POA: Diagnosis present

## 2015-12-21 DIAGNOSIS — I272 Other secondary pulmonary hypertension: Secondary | ICD-10-CM | POA: Diagnosis present

## 2015-12-21 DIAGNOSIS — D72829 Elevated white blood cell count, unspecified: Secondary | ICD-10-CM | POA: Diagnosis present

## 2015-12-21 DIAGNOSIS — I959 Hypotension, unspecified: Secondary | ICD-10-CM | POA: Diagnosis present

## 2015-12-21 DIAGNOSIS — B372 Candidiasis of skin and nail: Secondary | ICD-10-CM | POA: Diagnosis present

## 2015-12-21 DIAGNOSIS — E861 Hypovolemia: Secondary | ICD-10-CM | POA: Diagnosis present

## 2015-12-21 DIAGNOSIS — Z87442 Personal history of urinary calculi: Secondary | ICD-10-CM | POA: Diagnosis not present

## 2015-12-21 DIAGNOSIS — E869 Volume depletion, unspecified: Secondary | ICD-10-CM | POA: Diagnosis present

## 2015-12-21 DIAGNOSIS — Z794 Long term (current) use of insulin: Secondary | ICD-10-CM | POA: Diagnosis not present

## 2015-12-21 DIAGNOSIS — Z7901 Long term (current) use of anticoagulants: Secondary | ICD-10-CM

## 2015-12-21 DIAGNOSIS — I8310 Varicose veins of unspecified lower extremity with inflammation: Secondary | ICD-10-CM | POA: Diagnosis not present

## 2015-12-21 DIAGNOSIS — Z7982 Long term (current) use of aspirin: Secondary | ICD-10-CM | POA: Diagnosis not present

## 2015-12-21 DIAGNOSIS — E785 Hyperlipidemia, unspecified: Secondary | ICD-10-CM | POA: Diagnosis present

## 2015-12-21 DIAGNOSIS — I878 Other specified disorders of veins: Secondary | ICD-10-CM | POA: Diagnosis present

## 2015-12-21 DIAGNOSIS — M6281 Muscle weakness (generalized): Secondary | ICD-10-CM

## 2015-12-21 DIAGNOSIS — I8311 Varicose veins of right lower extremity with inflammation: Secondary | ICD-10-CM | POA: Diagnosis not present

## 2015-12-21 LAB — CBC WITH DIFFERENTIAL/PLATELET
BASOS ABS: 0.1 10*3/uL (ref 0.0–0.1)
Basophils Relative: 0 %
EOS ABS: 0 10*3/uL (ref 0.0–0.7)
EOS PCT: 0 %
HCT: 37.8 % — ABNORMAL LOW (ref 39.0–52.0)
HEMOGLOBIN: 12 g/dL — AB (ref 13.0–17.0)
LYMPHS ABS: 0.9 10*3/uL (ref 0.7–4.0)
LYMPHS PCT: 5 %
MCH: 28.8 pg (ref 26.0–34.0)
MCHC: 31.7 g/dL (ref 30.0–36.0)
MCV: 90.9 fL (ref 78.0–100.0)
Monocytes Absolute: 1.6 10*3/uL — ABNORMAL HIGH (ref 0.1–1.0)
Monocytes Relative: 9 %
NEUTROS PCT: 86 %
Neutro Abs: 16 10*3/uL — ABNORMAL HIGH (ref 1.7–7.7)
PLATELETS: 411 10*3/uL — AB (ref 150–400)
RBC: 4.16 MIL/uL — AB (ref 4.22–5.81)
RDW: 14.3 % (ref 11.5–15.5)
WBC: 18.6 10*3/uL — AB (ref 4.0–10.5)

## 2015-12-21 LAB — BASIC METABOLIC PANEL
Anion gap: 8 (ref 5–15)
BUN: 29 mg/dL — AB (ref 6–20)
CHLORIDE: 101 mmol/L (ref 101–111)
CO2: 28 mmol/L (ref 22–32)
Calcium: 8.7 mg/dL — ABNORMAL LOW (ref 8.9–10.3)
Creatinine, Ser: 0.9 mg/dL (ref 0.61–1.24)
Glucose, Bld: 89 mg/dL (ref 65–99)
POTASSIUM: 4.9 mmol/L (ref 3.5–5.1)
SODIUM: 137 mmol/L (ref 135–145)

## 2015-12-21 LAB — GLUCOSE, CAPILLARY: GLUCOSE-CAPILLARY: 183 mg/dL — AB (ref 65–99)

## 2015-12-21 LAB — I-STAT CG4 LACTIC ACID, ED: Lactic Acid, Venous: 1.31 mmol/L (ref 0.5–1.9)

## 2015-12-21 MED ORDER — ACETAMINOPHEN 325 MG PO TABS
650.0000 mg | ORAL_TABLET | Freq: Four times a day (QID) | ORAL | Status: DC | PRN
  Administered 2015-12-22 – 2015-12-26 (×4): 650 mg via ORAL
  Filled 2015-12-21 (×5): qty 2

## 2015-12-21 MED ORDER — BISACODYL 5 MG PO TBEC: 10.0000 mg | DELAYED_RELEASE_TABLET | Freq: Every day | ORAL | Status: DC | PRN

## 2015-12-21 MED ORDER — VERAPAMIL HCL 80 MG PO TABS
80.0000 mg | ORAL_TABLET | Freq: Three times a day (TID) | ORAL | Status: DC
  Administered 2015-12-21 – 2015-12-23 (×4): 80 mg via ORAL
  Filled 2015-12-21 (×5): qty 1

## 2015-12-21 MED ORDER — CEFAZOLIN SODIUM-DEXTROSE 2-4 GM/100ML-% IV SOLN
2.0000 g | Freq: Three times a day (TID) | INTRAVENOUS | Status: DC
  Administered 2015-12-21 – 2015-12-27 (×18): 2 g via INTRAVENOUS
  Filled 2015-12-21 (×21): qty 100

## 2015-12-21 MED ORDER — ACETAMINOPHEN 650 MG RE SUPP: 650.0000 mg | Freq: Four times a day (QID) | RECTAL | Status: DC | PRN

## 2015-12-21 MED ORDER — CEFAZOLIN SODIUM-DEXTROSE 2-4 GM/100ML-% IV SOLN
INTRAVENOUS | Status: AC
  Filled 2015-12-21: qty 200

## 2015-12-21 MED ORDER — PIOGLITAZONE HCL 45 MG PO TABS
45.0000 mg | ORAL_TABLET | Freq: Every day | ORAL | Status: DC
  Filled 2015-12-21: qty 1

## 2015-12-21 MED ORDER — METFORMIN HCL 500 MG PO TABS
1000.0000 mg | ORAL_TABLET | Freq: Every evening | ORAL | Status: DC
  Administered 2015-12-21 – 2015-12-26 (×6): 1000 mg via ORAL
  Filled 2015-12-21 (×6): qty 2

## 2015-12-21 MED ORDER — POLYETHYLENE GLYCOL 3350 17 G PO PACK: 17.0000 g | PACK | Freq: Every day | ORAL | Status: DC | PRN

## 2015-12-21 MED ORDER — INSULIN DEGLUDEC 200 UNIT/ML ~~LOC~~ SOPN: 20.0000 [IU] | PEN_INJECTOR | Freq: Every evening | SUBCUTANEOUS | Status: DC

## 2015-12-21 MED ORDER — VANCOMYCIN HCL IN DEXTROSE 1-5 GM/200ML-% IV SOLN
1000.0000 mg | Freq: Once | INTRAVENOUS | Status: AC
  Administered 2015-12-21: 1000 mg via INTRAVENOUS
  Filled 2015-12-21: qty 200

## 2015-12-21 MED ORDER — SODIUM CHLORIDE 0.9 % IV SOLN
INTRAVENOUS | Status: DC
  Administered 2015-12-21 – 2015-12-24 (×2): via INTRAVENOUS

## 2015-12-21 MED ORDER — SODIUM CHLORIDE 0.9 % IV BOLUS (SEPSIS)
1000.0000 mL | Freq: Once | INTRAVENOUS | Status: AC
  Administered 2015-12-21: 1000 mL via INTRAVENOUS

## 2015-12-21 MED ORDER — DABIGATRAN ETEXILATE MESYLATE 150 MG PO CAPS
ORAL_CAPSULE | ORAL | Status: AC
  Filled 2015-12-21: qty 1

## 2015-12-21 MED ORDER — PRAVASTATIN SODIUM 10 MG PO TABS
20.0000 mg | ORAL_TABLET | Freq: Every day | ORAL | Status: DC
  Administered 2015-12-21 – 2015-12-26 (×6): 20 mg via ORAL
  Filled 2015-12-21 (×3): qty 2
  Filled 2015-12-21 (×2): qty 1
  Filled 2015-12-21 (×2): qty 2
  Filled 2015-12-21: qty 1
  Filled 2015-12-21 (×3): qty 2

## 2015-12-21 MED ORDER — ASPIRIN EC 81 MG PO TBEC
81.0000 mg | DELAYED_RELEASE_TABLET | Freq: Every evening | ORAL | Status: DC
  Administered 2015-12-21 – 2015-12-24 (×4): 81 mg via ORAL
  Filled 2015-12-21 (×4): qty 1

## 2015-12-21 MED ORDER — ONDANSETRON HCL 4 MG PO TABS: 4.0000 mg | ORAL_TABLET | Freq: Four times a day (QID) | ORAL | Status: DC | PRN

## 2015-12-21 MED ORDER — CANAGLIFLOZIN 100 MG PO TABS
100.0000 mg | ORAL_TABLET | Freq: Every day | ORAL | Status: DC
Start: 2015-12-22 — End: 2015-12-22
  Filled 2015-12-21: qty 1

## 2015-12-21 MED ORDER — DABIGATRAN ETEXILATE MESYLATE 150 MG PO CAPS
150.0000 mg | ORAL_CAPSULE | Freq: Two times a day (BID) | ORAL | Status: DC
  Administered 2015-12-21 – 2015-12-27 (×12): 150 mg via ORAL
  Filled 2015-12-21 (×16): qty 1

## 2015-12-21 MED ORDER — ONDANSETRON HCL 4 MG/2ML IJ SOLN: 4.0000 mg | Freq: Four times a day (QID) | INTRAMUSCULAR | Status: DC | PRN

## 2015-12-21 MED ORDER — DOCUSATE SODIUM 100 MG PO CAPS
100.0000 mg | ORAL_CAPSULE | Freq: Two times a day (BID) | ORAL | Status: DC
  Administered 2015-12-21 – 2015-12-24 (×7): 100 mg via ORAL
  Filled 2015-12-21 (×7): qty 1

## 2015-12-21 MED ORDER — INSULIN GLARGINE 100 UNIT/ML ~~LOC~~ SOLN
20.0000 [IU] | Freq: Every day | SUBCUTANEOUS | Status: DC
  Administered 2015-12-21 – 2015-12-26 (×6): 20 [IU] via SUBCUTANEOUS
  Filled 2015-12-21 (×7): qty 0.2

## 2015-12-21 NOTE — ED Provider Notes (Signed)
6:28 PM Assumed care from Dr. Effie ShyWentz, please see their note for full history, physical and decision making until this point. In brief this is a 70 y.o. year old male who presented to the ED tonight with Extremity Weakness     Cellulitis, tachycardia likely 2/2 failing outpatient PO antibiotics. Awaiting labs and likely admission.   Blood pressure slowly drifted down, leukocytosis, tachycardia patient likely septic but negative lactic acid and MAP >65 so no code sepsis at this point. Discussed with hospitalist who wanted to stepdown for IV antibiotics.  Labs, studies and imaging reviewed by myself and considered in medical decision making if ordered. Imaging interpreted by radiology.  Labs Reviewed  BASIC METABOLIC PANEL - Abnormal; Notable for the following:       Result Value   BUN 29 (*)    Calcium 8.7 (*)    All other components within normal limits  CBC WITH DIFFERENTIAL/PLATELET - Abnormal; Notable for the following:    WBC 18.6 (*)    RBC 4.16 (*)    Hemoglobin 12.0 (*)    HCT 37.8 (*)    Platelets 411 (*)    Neutro Abs 16.0 (*)    Monocytes Absolute 1.6 (*)    All other components within normal limits  CULTURE, BLOOD (ROUTINE X 2)  CULTURE, BLOOD (ROUTINE X 2)  I-STAT CG4 LACTIC ACID, ED    No orders to display    No Follow-up on file.    Marily MemosJason Dvonte Gatliff, MD 12/21/15 [phone]

## 2015-12-21 NOTE — ED Triage Notes (Signed)
PT stated general weakness worsening since 11/27/15. PT stated his legs gave out from under him while walking to the bathroom today. PT also states he is currently on antibiotic treatment for cellulitis.

## 2015-12-21 NOTE — H&P (Addendum)
[facility] History and Physical  Jonathan HibbsRobert L Ballman ZOX:[id number] DOB: 12-Apr-1945 DOA: 12/21/2015  Referring physician: Dr Clayborne DanaMesner, [facility] ED PCP: [name],[name], MD   Chief Complaint: Gen weak, falls, chills and bilat leg pain, hx recent cellulitis  HPI: Jonathan Wise is a 70 y.o. male with hx of morbid obesity, DM2, chronic afib, chronic venous LE stasis , recurrent LE cellulitis presented with gen'd weakness, falls, chills and worsening leg pain L > R over the last 1-2 weeks.  Was admitted here 3 wks ago for 4 days for LLE cellulitis rx'd with vanc/ zosyn and dc'd on po doxy 5 more days.  Completed abx. Now L leg is hurting again and pt reporting chills and fevers up to 101 deg off and on for the last 2 weeks.  Hasn't been eating well.  In ED WBC is up 18k, BP's were good then dropped to 90's, better now w IVF bolus.  Asked to see for admission.   [place], [place] and [place]  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  [place].  Lives in [place] with his wife and oldest grand-daughter.  Had 1 daughter who died of cancer age 71 in '09.  Had one brother died '09 also.  No tob/ etoh.  No hx CVA, heart attack, cancer. Gets occ constipation w bleeding from straining.  No n/v or diarrhea, no abd pain.     Old chart: Jul '13 - venous stasis ulcers w acute cellulitis LLE, dopp neg for DVT. HypoNa+, low BP's , vol depletion, AKI , poss urine retnetion. CAF w RVR. DM2, A1c 8.0, MO, cystoscopy for urine retention > negative, PAH by echo. Limited study Mar '14 - SIRS w LLE cellulitis, chron bilat LE edema w venous stasis. Rx vanc/zosyn, IVF's. Uncont DM rx po meds, then lantus/ SSI were added. CAF on pradaxa/ dilt. CXR neg.   Dec '15 - dizzy / lightheaded after breakfast > BP's 70's in ED, brady w HR 30's.  Rx atropine/ IVF, rx UTI.  Verapamil held then restarted lower dose 240/d.   Aug 19- 23, 2017 > fever/ gen weak, temp 101 > ^WBC  and LLE cellulitis. Rx vanc/ zosyn, WBC improved. dc'd on po doxy for 5 more days. ACEi/ verap held until low BP's imrpoved.  dc'd back on verapmail. DM2   ROS  denies CP  no joint pain   no HA  no blurry vision  no rash  no diarrhea  no nausea/ vomiting  no dysuria  no difficulty voiding  no change in urine color    Past Medical History  Past Medical History:  Diagnosis Date  . Atrial fibrillation (HCC)    On Pradaxa  . Bilateral lower extremity edema   . DVT (deep venous thrombosis) (HCC) 1994   LLE. Completed tx with coumadin  . Hyperlipidemia   . Hypertension   . Kidney stones   . Morbid obesity (HCC)   . Pulmonary artery hypertension (HCC) 10/18/2011  . Type 2 diabetes mellitus (HCC)   . Urinary retention 10/15/2011  . Venous stasis ulcers (HCC)    Past Surgical History  Past Surgical History:  Procedure Laterality Date  . CYSTOSCOPY  10/18/2011   Procedure: CYSTOSCOPY FLEXIBLE;  Surgeon: Ky BarbanMohammad I Javaid, MD;  Location: [facility];  Service: Urology;  Laterality: N/A;  . HERNIA REPAIR     Umbilical   Family History  Family  History  Problem Relation Age of Onset  . Diabetes Mother   . Emphysema Father    Social History  reports that he has never smoked. He has never used smokeless tobacco. He reports that he does not drink alcohol or use drugs. Allergies  Allergies  Allergen Reactions  . Furosemide Other (See Comments)    Dizziness , drowsiness   Home medications Prior to Admission medications   Medication Sig Start Date End Date Taking? Authorizing Provider  aspirin EC 81 MG tablet Take 81 mg by mouth every evening.   Yes Historical Provider, MD  canagliflozin (INVOKANA) 100 MG TABS tablet Take 100 mg by mouth daily.   Yes Historical Provider, MD  cephALEXin (KEFLEX) 500 MG capsule Take 500 mg by mouth 3 (three) times daily.   Yes Historical Provider, MD  dabigatran (PRADAXA) 150 MG CAPS capsule Take 1 capsule (150 mg total) by mouth every 12 (twelve) hours.  12/01/15  Yes Henderson Cloud, MD  doxycycline (VIBRAMYCIN) 100 MG capsule Take 1 capsule (100 mg total) by mouth 2 (two) times daily. 12/01/15  Yes Henderson Cloud, MD  Insulin Degludec 200 UNIT/ML SOPN Inject 30 Units into the skin every evening. tresiba   Yes Historical Provider, MD  lisinopril (PRINIVIL,ZESTRIL) 5 MG tablet Take 5 mg by mouth daily.   Yes Historical Provider, MD  metFORMIN (GLUCOPHAGE) 500 MG tablet Take 1,000 mg by mouth every evening.    Yes Historical Provider, MD  pioglitazone (ACTOS) 45 MG tablet Take 45 mg by mouth daily.   Yes Historical Provider, MD  pravastatin (PRAVACHOL) 20 MG tablet Take 20 mg by mouth daily.   Yes Historical Provider, MD  verapamil (CALAN) 120 MG tablet Take 240 mg every AM and 120 mg every PM Patient taking differently: Take 120-240 mg by mouth 2 (two) times daily. Take 240 mg every AM and 120 mg every PM 06/01/14  Yes Laqueta Linden, MD  metoprolol tartrate (LOPRESSOR) 25 MG tablet Take 0.5 tablets (12.5 mg total) by mouth 3 (three) times daily. 12/01/15   Henderson Cloud, MD   Liver Function Tests No results for input(s): AST, ALT, ALKPHOS, BILITOT, PROT, ALBUMIN in the last 168 hours. No results for input(s): LIPASE, AMYLASE in the last 168 hours. CBC  Recent Labs Lab 12/21/15 1534  WBC 18.6*  NEUTROABS 16.0*  HGB 12.0*  HCT 37.8*  MCV 90.9  PLT 411*   Basic Metabolic Panel  Recent Labs Lab 12/21/15 1534  NA 137  K 4.9  CL 101  CO2 28  GLUCOSE 89  BUN 29*  CREATININE 0.90  CALCIUM 8.7*     Vitals:   12/21/15 1500 12/21/15 1530 12/21/15 1606 12/21/15 1609  BP: 102/72 130/59 119/71   Pulse: 118 119 (!) 122   Resp:    21  Temp:      TempSrc:      SpO2: 96% 99% 97%    Exam: Gen chron ill, pale, older adult, tired but no distress, alert and responds appropriately No rash, cyanosis or gangrene Sclera anicteric, throat clear and slightly dry  No jvd or bruits Chest clear bilat Cor  irreg irreg no MRG Abd soft ntnd no mass or ascites +bs markedly obese GU normal male Ext: diffuse bilat erythema both LE's bilat inner thighs and entire lower legs involved; more intense erythema of L lower legs; no pus or drainage, macerated in groin/ inner thighs.  Chronic stasis with minimal edema. Post legs are  diffusely erythematous as well up to the hips, this is chronic per pt Neuro is alert, Ox 3 , nf   Na 137 BUN 29  Cr 0.90  Glu 89  WBC 18k  Hb 12  plt 411 LA 1.31  Home meds > Invokana, pradaxa, doxy po 100 bid/ Keflex 500 tid, Insulin Degludec 30 u qhs, lisinopril, Metformin 1000 qhs, Actos , statin, Verapamil 240 am/ 120 pm, Lopressor 12.5 tid   Assessment: 1. Cellulitis - with ^WBC/ pain LLE , recently in hospital for the same 3 weeks ago.  May need longer course of IV abx. Admit, IV Ancef for nonpurulent moderate disease cellulitis.  F/U CBC in am.  2. Hypotension - give IVF's, looks dry on exam 3. AFib w RVR - cont verapamil short-acting if BP is not too low. Cont pradaxa for now. Not taking MTP any more, have deleted from home list.  4. Morbid obesity 5. DM2 - cont insulin degludec, lower to 20 units hs for now, add SSI, also cont oral meds 6. HL -statin 7. Chronic venous stasis dermatitis - he has special routine for dressing the legs, he said his family will bring in the supplies in the morning. Keep open for now.  8. GOC - wants full code. Will take narcotics but doesn't want heavy doses.  9. HTN - hold lisinopril for now  Plan - as above     MAC, DOWDELL [facility] Pager [phone]  Cell [phone]  If 7PM-7AM, please contact night-coverage [email or web address] Password [facility] 12/21/2015, 5:31 PM

## 2015-12-21 NOTE — ED Provider Notes (Signed)
[facility]Y DEPT Provider Note   CSN: [id number] Arrival date & time: 12/21/15  1320     History   Chief Complaint Chief Complaint  Patient presents with  . Extremity Weakness    HPI Jonathan Wise is a 70 y.o. male.  He is here for worsening redness, pain and swelling in both legs, left greater than right, despite being recently treated in hospital, and a home with IV followed by oral antibiotics. His PCP called in an additional prescription for Keflex, 3 days ago. He complains of fever and chills. He feels he is getting weaker and weaker, and today fell twice, once injuring his right knee. He has been changing his dressings at home once a week using "Unna boot". He does not like to use hospital supplied dressings. He admits to using disposable diapers on the left leg, to help with some of the drainage. He denies shortness of breath, chest pain, nausea or vomiting. He complains of decreased appetite for several weeks. He feels he is not improved since discharge from the hospital about 2 weeks ago. He has a follow-up appointment scheduled for tomorrow with his primary care doctor. He saw the doctor once in follow-up after discharge from the hospital. There are no other known modifying factors.    HPI  Past Medical History:  Diagnosis Date  . Atrial fibrillation (HCC)    On Pradaxa  . Bilateral lower extremity edema   . DVT (deep venous thrombosis) (HCC) 1994   LLE. Completed tx with coumadin  . Hyperlipidemia   . Hypertension   . Kidney stones   . Morbid obesity (HCC)   . Pulmonary artery hypertension (HCC) 10/18/2011  . Type 2 diabetes mellitus (HCC)   . Urinary retention 10/15/2011  . Venous stasis ulcers [facility])     Patient Active Problem List   Diagnosis Date Noted  . Cellulitis and abscess 11/27/2015  . Bradycardia 03/15/2014  . UTI (urinary tract infection) 03/15/2014  . Leukocytosis 03/15/2014  . Lactic acidosis 03/15/2014  . Atrial fibrillation (HCC) 06/23/2012    . PVC's (premature ventricular contractions) 06/23/2012  . Cellulitis of leg, left 06/22/2012  . SIRS (systemic inflammatory response syndrome) (HCC) 06/22/2012  . Ketonuria 06/22/2012  . Morbid obesity (HCC) 06/22/2012  . Osteoarthritis of right knee 06/22/2012  . Current use of long term anticoagulation 06/22/2012  . Pulmonary artery hypertension (HCC) 10/18/2011  . Metabolic acidosis 10/16/2011  . Urinary retention 10/15/2011  . Cellulitis of left leg 10/14/2011  . Bilateral lower extremity edema 10/14/2011  . Hypotension 10/14/2011  . Hypovolemia 10/14/2011  . Acute renal failure (HCC) 10/14/2011  . Venous stasis ulcers (HCC) 10/14/2011  . Hyponatremia 10/14/2011  . Atrial fibrillation with rapid ventricular response (HCC) 10/14/2011  . Nausea 10/14/2011  . Type 2 diabetes mellitus without complication (HCC) 10/14/2011    Past Surgical History:  Procedure Laterality Date  . CYSTOSCOPY  10/18/2011   Procedure: CYSTOSCOPY FLEXIBLE;  Surgeon: Ky Barban, MD;  Location: [facility] ORS;  Service: Urology;  Laterality: N/A;  . HERNIA REPAIR     Umbilical       Home Medications    Prior to Admission medications   Medication Sig Start Date End Date Taking? Authorizing Provider  aspirin EC 81 MG tablet Take 81 mg by mouth every evening.   Yes Historical Provider, MD  canagliflozin (INVOKANA) 100 MG TABS tablet Take 100 mg by mouth daily.   Yes Historical Provider, MD  cephALEXin (KEFLEX) 500 MG capsule Take 500  mg by mouth 3 (three) times daily.   Yes Historical Provider, MD  dabigatran (PRADAXA) 150 MG CAPS capsule Take 1 capsule (150 mg total) by mouth every 12 (twelve) hours. 12/01/15  Yes Henderson Cloud, MD  doxycycline (VIBRAMYCIN) 100 MG capsule Take 1 capsule (100 mg total) by mouth 2 (two) times daily. 12/01/15  Yes Henderson Cloud, MD  Insulin Degludec 200 UNIT/ML SOPN Inject 30 Units into the skin every evening. tresiba   Yes Historical Provider, MD   lisinopril (PRINIVIL,ZESTRIL) 5 MG tablet Take 5 mg by mouth daily.   Yes Historical Provider, MD  metFORMIN (GLUCOPHAGE) 500 MG tablet Take 1,000 mg by mouth every evening.    Yes Historical Provider, MD  pioglitazone (ACTOS) 45 MG tablet Take 45 mg by mouth daily.   Yes Historical Provider, MD  pravastatin (PRAVACHOL) 20 MG tablet Take 20 mg by mouth daily.   Yes Historical Provider, MD  verapamil (CALAN) 120 MG tablet Take 240 mg every AM and 120 mg every PM Patient taking differently: Take 120-240 mg by mouth 2 (two) times daily. Take 240 mg every AM and 120 mg every PM 06/01/14  Yes Laqueta Linden, MD  metoprolol tartrate (LOPRESSOR) 25 MG tablet Take 0.5 tablets (12.5 mg total) by mouth 3 (three) times daily. 12/01/15   Henderson Cloud, MD    Family History Family History  Problem Relation Age of Onset  . Diabetes Mother   . Emphysema Father     Social History Social History  Substance Use Topics  . Smoking status: Never Smoker  . Smokeless tobacco: Never Used  . Alcohol use No     Allergies   Furosemide   Review of Systems Review of Systems  All other systems reviewed and are negative.    Physical Exam Updated Vital Signs BP (!) 136/51 (BP Location: Left Arm)   Pulse 117   Temp 98.4 F (36.9 C) (Oral)   Resp 20   Physical Exam  Constitutional: He is oriented to person, place, and time. He appears well-developed.  Morbidly obese  HENT:  Head: Normocephalic and atraumatic.  Right Ear: External ear normal.  Left Ear: External ear normal.  Eyes: Conjunctivae and EOM are normal. Pupils are equal, round, and reactive to light.  Neck: Normal range of motion and phonation normal. Neck supple.  Cardiovascular: Normal rate, regular rhythm and normal heart sounds.   Pulmonary/Chest: Effort normal and breath sounds normal. He exhibits no bony tenderness.  Abdominal: Soft. There is no tenderness.  Musculoskeletal: Normal range of motion.  Severe leg  swelling bilaterally. She has difficulty moving his legs bilaterally, unclear if this is secondary to a disability, pain, or lack of effort.  Neurological: He is alert and oriented to person, place, and time. No cranial nerve deficit or sensory deficit. He exhibits normal muscle tone. Coordination normal.  Skin: Skin is warm, dry and intact. There is erythema (Medial thighs bilaterally. These extend toward the groin, but not onto the genital region or the lower abdomen.).  Dressings bilateral legs, long pieces of medicated gauze, with Coban  Covering. Left leg has interspersed disposable diapers between layers of gauze. There is a moderate amount of old drainage on the diapers. Foul drainage from right leg. Right leg has scattered areas of superficial desquamation, with some granulation tissue. No deep wounds, are appreciated.  Psychiatric: He has a normal mood and affect. His behavior is normal. Judgment and thought content normal.  Nursing note  and vitals reviewed.    ED Treatments / Results  Labs (all labs ordered are listed, but only abnormal results are displayed) Labs Reviewed  CULTURE, BLOOD (ROUTINE X 2)  CULTURE, BLOOD (ROUTINE X 2)  BASIC METABOLIC PANEL  CBC WITH DIFFERENTIAL/PLATELET  I-STAT CG4 LACTIC ACID, ED    EKG  EKG Interpretation None       Radiology No results found.  Procedures Procedures (including critical care time)  Medications Ordered in ED Medications  sodium chloride 0.9 % bolus 1,000 mL (not administered)     Initial Impression / Assessment and Plan / ED Course  I have reviewed the triage vital signs and the nursing notes.  Pertinent labs & imaging results that were available during my care of the patient were reviewed by me and considered in my medical decision making (see chart for details).  Clinical Course      Medications  sodium chloride 0.9 % bolus 1,000 mL (not administered)  vancomycin (VANCOCIN) IVPB 1000 mg/200 mL premix (not  administered)    Patient Vitals for the past 24 hrs:  BP Temp Temp src Pulse Resp  12/21/15 1337 (!) 136/51 98.4 F (36.9 C) Oral 117 20    Transition care to Dr. Clayborne DanaMesner- 15:45   Final Clinical Impressions(s) / ED Diagnoses   Final diagnoses:  Cellulitis of left leg    Persistent cellulitis, worsening despite outpatient treatment.  Nursing Notes Reviewed/ Care Coordinated Applicable Imaging Reviewed Interpretation of Laboratory Data incorporated into ED treatment    New Prescriptions New Prescriptions   No medications on file     Mancel BaleElliott Jaskirat Zertuche, MD 12/21/15 1548

## 2015-12-21 NOTE — ED Notes (Signed)
Dr. Arta SilenceShertz at bedside, wants to leave legs unwrapped at this time.  No wound care performed.

## 2015-12-21 NOTE — ED Notes (Signed)
Pt states that he cannot get out of bed sometimes to go to the restroom so he uses the urinal. There is evidence of soiled dressing around his legs and pt legs are red with flakey skin on both legs from ankles to upper thighs.

## 2015-12-22 DIAGNOSIS — I8312 Varicose veins of left lower extremity with inflammation: Secondary | ICD-10-CM

## 2015-12-22 DIAGNOSIS — E869 Volume depletion, unspecified: Secondary | ICD-10-CM

## 2015-12-22 DIAGNOSIS — I4891 Unspecified atrial fibrillation: Secondary | ICD-10-CM

## 2015-12-22 DIAGNOSIS — L03116 Cellulitis of left lower limb: Principal | ICD-10-CM

## 2015-12-22 DIAGNOSIS — E119 Type 2 diabetes mellitus without complications: Secondary | ICD-10-CM

## 2015-12-22 DIAGNOSIS — B372 Candidiasis of skin and nail: Secondary | ICD-10-CM | POA: Diagnosis present

## 2015-12-22 LAB — URINALYSIS, ROUTINE W REFLEX MICROSCOPIC
Bilirubin Urine: NEGATIVE
GLUCOSE, UA: NEGATIVE mg/dL
HGB URINE DIPSTICK: NEGATIVE
Ketones, ur: NEGATIVE mg/dL
Nitrite: NEGATIVE
PH: 5.5 (ref 5.0–8.0)
Specific Gravity, Urine: 1.025 (ref 1.005–1.030)

## 2015-12-22 LAB — CBC
HCT: 32.6 % — ABNORMAL LOW (ref 39.0–52.0)
Hemoglobin: 10.2 g/dL — ABNORMAL LOW (ref 13.0–17.0)
MCH: 28.5 pg (ref 26.0–34.0)
MCHC: 31.3 g/dL (ref 30.0–36.0)
MCV: 91.1 fL (ref 78.0–100.0)
PLATELETS: 299 10*3/uL (ref 150–400)
RBC: 3.58 MIL/uL — AB (ref 4.22–5.81)
RDW: 14.2 % (ref 11.5–15.5)
WBC: 12 10*3/uL — AB (ref 4.0–10.5)

## 2015-12-22 LAB — COMPREHENSIVE METABOLIC PANEL
ALT: 13 U/L — AB (ref 17–63)
AST: 19 U/L (ref 15–41)
Albumin: 2 g/dL — ABNORMAL LOW (ref 3.5–5.0)
Alkaline Phosphatase: 77 U/L (ref 38–126)
Anion gap: 7 (ref 5–15)
BILIRUBIN TOTAL: 0.7 mg/dL (ref 0.3–1.2)
BUN: 25 mg/dL — AB (ref 6–20)
CALCIUM: 8 mg/dL — AB (ref 8.9–10.3)
CO2: 25 mmol/L (ref 22–32)
CREATININE: 0.85 mg/dL (ref 0.61–1.24)
Chloride: 104 mmol/L (ref 101–111)
Glucose, Bld: 100 mg/dL — ABNORMAL HIGH (ref 65–99)
Potassium: 4.1 mmol/L (ref 3.5–5.1)
Sodium: 136 mmol/L (ref 135–145)
TOTAL PROTEIN: 6.1 g/dL — AB (ref 6.5–8.1)

## 2015-12-22 LAB — URINE MICROSCOPIC-ADD ON

## 2015-12-22 LAB — GLUCOSE, CAPILLARY
GLUCOSE-CAPILLARY: 95 mg/dL (ref 65–99)
Glucose-Capillary: 74 mg/dL (ref 65–99)
Glucose-Capillary: 110 mg/dL — ABNORMAL HIGH (ref 65–99)
Glucose-Capillary: 96 mg/dL (ref 65–99)

## 2015-12-22 MED ORDER — DIGOXIN 125 MCG PO TABS
0.1250 mg | ORAL_TABLET | Freq: Every day | ORAL | Status: DC
  Administered 2015-12-23: 0.125 mg via ORAL
  Filled 2015-12-22: qty 1

## 2015-12-22 MED ORDER — INSULIN ASPART 100 UNIT/ML ~~LOC~~ SOLN: 0.0000 [IU] | Freq: Every day | SUBCUTANEOUS | Status: DC

## 2015-12-22 MED ORDER — FLUCONAZOLE 100 MG PO TABS
100.0000 mg | ORAL_TABLET | Freq: Every day | ORAL | Status: AC
  Administered 2015-12-22 – 2015-12-24 (×3): 100 mg via ORAL
  Filled 2015-12-22 (×3): qty 1

## 2015-12-22 MED ORDER — NYSTATIN 100000 UNIT/GM EX OINT
TOPICAL_OINTMENT | Freq: Two times a day (BID) | CUTANEOUS | Status: DC
  Administered 2015-12-22 – 2015-12-24 (×5): via TOPICAL
  Administered 2015-12-24 – 2015-12-25 (×2): 1 via TOPICAL
  Administered 2015-12-26 – 2015-12-27 (×4): via TOPICAL
  Filled 2015-12-22 (×2): qty 15

## 2015-12-22 MED ORDER — DIGOXIN 0.25 MG/ML IJ SOLN
0.1250 mg | Freq: Once | INTRAMUSCULAR | Status: AC
  Administered 2015-12-22: 0.125 mg via INTRAVENOUS
  Filled 2015-12-22: qty 2

## 2015-12-22 MED ORDER — INSULIN ASPART 100 UNIT/ML ~~LOC~~ SOLN
0.0000 [IU] | Freq: Three times a day (TID) | SUBCUTANEOUS | Status: DC
  Administered 2015-12-24: 1 [IU] via SUBCUTANEOUS
  Administered 2015-12-27: 2 [IU] via SUBCUTANEOUS

## 2015-12-22 NOTE — Care Management Note (Signed)
Case Management Note  Patient Details  Name: Ferd HibbsRobert L Lavalle MRN: [id number] Date of Birth: [birth year]  Subjective/Objective:                  Pt admitted with cellulitis. Pt is from home, lives with his wife and granddaughter. He is active with [facility] for nursing services. He feels he will need SNF at DC as he has had multiple falls a day a home and his wounds have progressively worsened. Pt eval pending. CSW aware of potential needs.   Action/Plan: Anticipate pt will DC to SNF. Will cont to follow.   Expected Discharge Date:    12/24/2015              Expected Discharge Plan:  Skilled Nursing Facility  In-House Referral:  Clinical Social Work  Discharge planning Services  CM Consult  Post Acute Care Choice:  NA Choice offered to:  NA  DME Arranged:    DME Agency:     HH Arranged:    HH Agency:     Status of Service:  In process, will continue to follow  If discussed at Long Length of Stay Meetings, dates discussed:    Additional Comments:  [name], Gemini Bunte Demske, RN 12/22/2015, 1:23 PM

## 2015-12-22 NOTE — Progress Notes (Addendum)
PROGRESS NOTE    Jonathan Wise  WUJ:[id number] DOB: July 18, 1945 DOA: 12/21/2015 PCP: Pamelia Hoit, MD    Brief Narrative:  Patient is a 70 yo man with history of chronic atrial fibrillation, diabetes mellitus, hypertension, morbid obesity, chronic venous stasis of his lower extremities, and recurrent lower extremity cellulitis, who presented on 12/21/2015 with generalized weakness, falls, bilateral leg pain with associated redness, and chills. He was just discharged 12/01/2015 for treatment of left lower extremity cellulitis/venous stasis ulcers and A. fib with RVR. In the ED, he was afebrile and tachycardic with a heart rate in the 120s. His initial blood pressure was in the 130s, but fell to the 90s systolically. His white blood cell count was elevated at 18.6. He was admitted for further evaluation and management.   Assessment & Plan:   Principal Problem:   Cellulitis of left leg Active Problems:   Chronic venous stasis dermatitis   Atrial fibrillation with rapid ventricular response (HCC)   Type 2 diabetes mellitus without complication (HCC)   Current use of long term anticoagulation   Hypotension   Morbid obesity (HCC)   Leukocytosis   Volume depletion   Cellulitis    1. Recurrent left greater than right bilateral lower extremity cellulitis with venous stasis changes. The patient was treated approximately 3 weeks ago for the same. He was discharged on doxycycline which she completed. He is also treated with special dressings for chronic venous stasis changes. -He was started on cefazolin. This will be continued for now, but will consider adding vancomycin. -We will add Diflucan and topical nystatin ointment for probable superimposed Candida intertrigo/cellulitis. -Wound care consult ordered for further recommendations and dressing management. -His blood pressure has improved. His white blood cell count has improved to 12.0.  Candida intertrigo. -Management as above with  topical nystatin and oral Diflucan 3 days.  A. fib with RVR. Patient is treated chronically with verapamil and Pradaxa. Metoprolol was added to his discharge medications during the previous hospitalization, but he discontinued it due to symptomatic low blood pressures. His heart rate was in the 120s on admission. It continues to be in the low 100s to the 120s. He has no chest pain. -Due to low-normal blood pressures, verapamil was changed to every 8 hour dosing-agree. It will be difficult to titrate up the verapamil at this point, so will add digoxin (he has taken it in the past).  Hypotension secondary to hypovolemia/chronic hypertension. Patient is treated chronically with lisinopril and verapamil. -Due to low-normal blood pressures, lisinopril is being held. He was started on IV fluids for hydration-will increase the rate.  Type 2 diabetes mellitus. The patient is treated chronically with Degludec insulin 30 units every evening, metformin, Invokana, and Actos. His hemoglobin A1c during the previous hospitalization was 5.7. It was suggested that Actos could be discontinued at that time, but apparently he was still taking it. -Due to his relatively normal CBGs in the setting of an illness, will hold Invokana, and Actos. Continue metformin and Lantus. Add sensitive scale NovoLog.  Generalized weakness. Patient has had generalized weakness, particularly in his legs and progressive debilitation. He has been morbidly obese for years and it may be taking a toll on his joints and his conditioning. His recurrent lower extremity cellulitis is certainly contributing. His TSH was within normal limits during the previous hospitalization. -We'll order physical therapy evaluation.     DVT prophylaxis: Pradaxa Code Status: Full code Family Communication: Family not available Disposition Plan: Discharge when clinically appropriate, likely  in a few days. ? SNF.   Consultants:   Wound  care  Procedures:   None  Antimicrobials:   Cefazolin 9/12>>   Antifungal Diflucan 9/13/173 days.   Subjective: Patient denies chest pain or shortness of breath. He sees no difference in his legs, except the pain is not quite as bad. He complains of progressive generalized weakness over the past few weeks.   Objective: Vitals:   12/21/15 2100 12/21/15 2200 12/22/15 0000 12/22/15 0400  BP: (!) 83/70 (!) 115/51    Pulse:      Resp: (!) 24 (!) 24    Temp:   98.6 F (37 C) 98.6 F (37 C)  TempSrc:   Oral Oral  SpO2:      Height:        Intake/Output Summary (Last 24 hours) at 12/22/15 0718 Last data filed at 12/22/15 0600  Gross per 24 hour  Intake          1001.25 ml  Output              650 ml  Net           351.25 ml   There were no vitals filed for this visit.  Examination:  General exam: Appears calm and comfortable, but appears to be chronically debilitated.  Respiratory system: Clear to auscultation. Respiratory effort normal. Cardiovascular system: Irregular, irregular with tachycardia. Trace pedal edema. Gastrointestinal system: Abdomen is obese, nondistended, soft and nontender. No organomegaly or masses felt. Normal bowel sounds heard. Central nervous system: Alert and oriented. No focal neurological deficits. Extremities/musculoskeletal: Lower extremities without acute hot red joints but with stasis dermatitis changes. Skin: Left greater than right lower extremities with scaliness, moderate erythema of both eyes and both lower legs, few excoriated areas over left thigh and leg; excoriated erythema of the perineum and pannus. Psychiatry: Judgement and insight appear normal. Mood & affect appropriate.     Data Reviewed: I have personally reviewed following labs and imaging studies  CBC:  Recent Labs Lab 12/21/15 1534 12/22/15 0509  WBC 18.6* 12.0*  NEUTROABS 16.0*  --   HGB 12.0* 10.2*  HCT 37.8* 32.6*  MCV 90.9 91.1  PLT 411* 299   Basic  Metabolic Panel:  Recent Labs Lab 12/21/15 1534 12/22/15 0509  NA 137 136  K 4.9 4.1  CL 101 104  CO2 28 25  GLUCOSE 89 100*  BUN 29* 25*  CREATININE 0.90 0.85  CALCIUM 8.7* 8.0*   GFR: CrCl cannot be calculated (Unknown ideal weight.). Liver Function Tests:  Recent Labs Lab 12/22/15 0509  AST 19  ALT 13*  ALKPHOS 77  BILITOT 0.7  PROT 6.1*  ALBUMIN 2.0*   No results for input(s): LIPASE, AMYLASE in the last 168 hours. No results for input(s): AMMONIA in the last 168 hours. Coagulation Profile: No results for input(s): INR, PROTIME in the last 168 hours. Cardiac Enzymes: No results for input(s): CKTOTAL, CKMB, CKMBINDEX, TROPONINI in the last 168 hours. BNP (last 3 results) No results for input(s): PROBNP in the last 8760 hours. HbA1C: No results for input(s): HGBA1C in the last 72 hours. CBG:  Recent Labs Lab 12/21/15 2149  GLUCAP 183*   Lipid Profile: No results for input(s): CHOL, HDL, LDLCALC, TRIG, CHOLHDL, LDLDIRECT in the last 72 hours. Thyroid Function Tests: No results for input(s): TSH, T4TOTAL, FREET4, T3FREE, THYROIDAB in the last 72 hours. Anemia Panel: No results for input(s): VITAMINB12, FOLATE, FERRITIN, TIBC, IRON, RETICCTPCT in the last 72 hours.  Sepsis Labs:  Recent Labs Lab 12/21/15 1552  LATICACIDVEN 1.31    No results found for this or any previous visit (from the past 240 hour(s)).       Radiology Studies: No results found.      Scheduled Meds: . aspirin EC  81 mg Oral QPM  . canagliflozin  100 mg Oral Daily  .  ceFAZolin (ANCEF) IV  2 g Intravenous Q8H  . dabigatran  150 mg Oral Q12H  . docusate sodium  100 mg Oral BID  . insulin glargine  20 Units Subcutaneous QHS  . metFORMIN  1,000 mg Oral QPM  . pioglitazone  45 mg Oral Daily  . pravastatin  20 mg Oral q1800  . verapamil  80 mg Oral Q8H   Continuous Infusions: . sodium chloride 75 mL/hr at 12/21/15 1951     LOS: 1 day    Time spent: 35  minutes    [name], MD [facility] Pager [phone]  If 7PM-7AM, please contact night-coverage [email or web address] Password [facility] 12/22/2015, 7:18 AM

## 2015-12-22 NOTE — Progress Notes (Signed)
Dr. Onalee Huaavid was notified about patient's increased HR 120s, SR, BP 112/ 46. Patient denies any pain. No new orders received. Continue to monitor the patient.

## 2015-12-22 NOTE — Care Management Important Message (Signed)
Important Message  Patient Details  Name: Jonathan HibbsRobert L Wise MRN: [id number] Date of Birth: [birth year]   Medicare Important Message Given:  Yes    [name], Dequante Tremaine Demske, RN 12/22/2015, 1:26 PM

## 2015-12-22 NOTE — Consult Note (Addendum)
WOC Nurse wound consult note Reason for Consult: Consult requested for bilat legs.  Pt states he has been wearing Una boots for more than 10 years and has been followed periodically at the outpatient wound care center.  He developed cellulitis with increased edema and weeping this week and was admitted to the hospital; his compression wraps were removed at some point prior to this consult.  Currently, legs have decreased amt drainage and no open wounds. There are 2 abrasions to anterior legs, which he says occurred when he fell at home.  Consult was performed using the remote camera and assistance from a nurse tech to assess posterior legs. Wound type: Currently, there are no open wounds to bilat legs. Generalized edema and erythremia; skin dry and cracked; appearance consistent with venous stasis changes. Measurement: Left anterior calf with abrasion; dark reddish purple; approx 7X3cm, no open wound or drainage.  Right anterior calf with the same appearance, 3X3cm Dressing procedure/placement/frequency: Pt is very well-informed regarding plan of care.  He states he can only use Una boots which are Zinc/calamine and his family members will bring in the supplies from home and apply to bilat legs; he verbalizes that they perform this treatment for him at home and know the correct procedure for application. Dressings can be changed Q Wed and Sat.  He agrees with this plan of care, since there is decreased drainage and swelling at this time, according to the patient.  Post note 12:30; pt has decided to use modified compression to left leg [facility], instead of currently applying an Burkina Fasona boot until drainage has further decreased.  Talked to patient via phone call.  Orders provided for bedside nurse to apply ABD pads, kerlex, and ace wrap.  Family members can bring in supplies from home and apply Una boots to RLE today, and to bilat legs when drainage has decreased, and change Q Wed and  Sat. Pt agrees with this plan of care. Please re-consult if further assistance is needed.  Thank-you,  Cammie Mcgeeawn Laressa Bolinger MSN, RN, CWOCN, [facility], [facility] [phone]

## 2015-12-23 DIAGNOSIS — I8310 Varicose veins of unspecified lower extremity with inflammation: Secondary | ICD-10-CM

## 2015-12-23 LAB — GLUCOSE, CAPILLARY
GLUCOSE-CAPILLARY: 69 mg/dL (ref 65–99)
Glucose-Capillary: 90 mg/dL (ref 65–99)
Glucose-Capillary: 80 mg/dL (ref 65–99)
Glucose-Capillary: 110 mg/dL — ABNORMAL HIGH (ref 65–99)

## 2015-12-23 LAB — BASIC METABOLIC PANEL
Anion gap: 7 (ref 5–15)
BUN: 17 mg/dL (ref 6–20)
CALCIUM: 7.9 mg/dL — AB (ref 8.9–10.3)
CO2: 23 mmol/L (ref 22–32)
CREATININE: 0.73 mg/dL (ref 0.61–1.24)
Chloride: 107 mmol/L (ref 101–111)
GFR calc Af Amer: >60 mL/min (ref >60)
Glucose, Bld: 73 mg/dL (ref 65–99)
Potassium: 3.8 mmol/L (ref 3.5–5.1)
SODIUM: 137 mmol/L (ref 135–145)

## 2015-12-23 LAB — CBC
HCT: 32.7 % — ABNORMAL LOW (ref 39.0–52.0)
Hemoglobin: 10 g/dL — ABNORMAL LOW (ref 13.0–17.0)
MCH: 28.2 pg (ref 26.0–34.0)
MCHC: 30.6 g/dL (ref 30.0–36.0)
MCV: 92.4 fL (ref 78.0–100.0)
PLATELETS: 256 10*3/uL (ref 150–400)
RBC: 3.54 MIL/uL — ABNORMAL LOW (ref 4.22–5.81)
RDW: 14.1 % (ref 11.5–15.5)
WBC: 9.6 10*3/uL (ref 4.0–10.5)

## 2015-12-23 MED ORDER — DILTIAZEM HCL 60 MG PO TABS
90.0000 mg | ORAL_TABLET | Freq: Three times a day (TID) | ORAL | Status: DC
  Administered 2015-12-23 – 2015-12-27 (×13): 90 mg via ORAL
  Filled 2015-12-23 (×13): qty 1

## 2015-12-23 NOTE — Progress Notes (Signed)
PROGRESS NOTE    Jonathan Wise  WUJ:[id number] DOB: 1946-02-18 DOA: 12/21/2015 PCP: Pamelia Hoit, MD    Brief Narrative:  Patient is a 70 yo man with history of chronic atrial fibrillation, diabetes mellitus, hypertension, morbid obesity, chronic venous stasis of his lower extremities, and recurrent lower extremity cellulitis, who presented on 12/21/2015 with generalized weakness, falls, bilateral leg pain with associated redness, and chills. He was just discharged 12/01/2015 for treatment of left lower extremity cellulitis/venous stasis ulcers and A. fib with RVR. In the ED, he was afebrile and tachycardic with a heart rate in the 120s. His initial blood pressure was in the 130s, but fell to the 90s systolically. His white blood cell count was elevated at 18.6. He was admitted for further evaluation and management.   Assessment & Plan:   Principal Problem:   Cellulitis of left leg Active Problems:   Chronic venous stasis dermatitis   Atrial fibrillation with rapid ventricular response (HCC)   Type 2 diabetes mellitus without complication (HCC)   Current use of long term anticoagulation   Hypotension   Morbid obesity (HCC)   Leukocytosis   Volume depletion   Cellulitis   Candidal intertrigo    1. Recurrent left greater than right bilateral lower extremity cellulitis with venous stasis changes. The patient was treated approximately 3 weeks ago for the same. He was discharged on doxycycline which he completed. He is also treated with special dressings for chronic venous stasis changes. -He was started on cefazolin. This will be continued for now, but will consider adding vancomycin. - Diflucan and topical nystatin ointment were started for probable superimposed Candida intertrigo/cellulitis. -Wound care was consulted. Recommendations for dressing management noted and appreciated. -His blood pressure has improved. His white blood cell count has improved and normalized.  Candida  intertrigo. -Management as above with topical nystatin and oral Diflucan 3 days.  A. fib with RVR. Patient is treated chronically with verapamil and Pradaxa. Metoprolol was added to his discharge medications during the previous hospitalization, but he discontinued it due to symptomatic low blood pressures. His heart rate was in the 120s on admission. It continues to be in the low 100s to the 120s. He has no chest pain. -Due to low-normal blood pressures on admission, verapamil was changed to every 8 hour dosing. Digoxin was started at 0.125 mg-will consider titrating up to 0.25 mg. -We'll discontinue verapamil and try Cardizem at 90 mg every 8 hours. His blood pressure has improved.   Hypotension secondary to hypovolemia/chronic hypertension. Patient is treated chronically with lisinopril and verapamil. -Due to low-normal blood pressures on admission, lisinopril was held. He was started on IV fluids for hydration. His blood pressure has improved, but will hold lisinopril as rate limiting medications are being titrated.  Type 2 diabetes mellitus. The patient is treated chronically with Degludec insulin 30 units every evening, metformin, Invokana, and Actos. His hemoglobin A1c during the previous hospitalization was 5.7. It was suggested that Actos could be discontinued at that time, but apparently he was still taking it. -Due to his relatively normal CBGs in the setting of an illness,  Invokana, and Actos are being held. Continue metformin and Lantus. Sliding scale NovoLog was started.  Generalized weakness. Patient has had generalized weakness, particularly in his legs and progressive debilitation. He has been morbidly obese for years and it may be taking a toll on his joints and his conditioning. His recurrent lower extremity cellulitis is certainly contributing. His TSH was within normal limits during  the previous hospitalization. -Physical therapy ordered for evaluation. I strongly believe  that the patient would benefit from short-term rehabilitation in a SNF.     DVT prophylaxis: Pradaxa Code Status: Full code Family Communication: Family not available Disposition Plan: Discharge when clinically appropriate, likely in a few days. ? SNF.   Consultants:   Wound care  Procedures:   None  Antimicrobials:   Cefazolin 9/12>>   Antifungal Diflucan 9/13/173 days.   Subjective: Patient says that he is feeling better. He is concerned about whether or not he'll be able to manage himself at home. He is interested in physical therapy at a rehabilitation facility.  Objective: Vitals:   12/23/15 0200 12/23/15 0300 12/23/15 0400 12/23/15 0500  BP: (!) 129/50     Pulse: (!) 120     Resp: (!) 28 (!) 29 (!) 27   Temp:   97.5 F (36.4 C)   TempSrc:   Oral   SpO2:      Weight:    (!) 163.3 kg (360 lb)  Height:        Intake/Output Summary (Last 24 hours) at 12/23/15 0823 Last data filed at 12/23/15 0500  Gross per 24 hour  Intake              480 ml  Output             1475 ml  Net             -995 ml   Filed Weights   12/22/15 0900 12/23/15 0500  Weight: (!) 175.6 kg (387 lb 2 oz) (!) 163.3 kg (360 lb)    Examination:  General exam: Appears calm and comfortable. He looks better.  Respiratory system: Clear to auscultation. Respiratory effort normal. Cardiovascular system: Irregular, irregular with tachycardia. Trace pedal edema. Gastrointestinal system: Abdomen is obese, nondistended, soft and nontender. No organomegaly or masses felt. Normal bowel sounds heard. Central nervous system: Alert and oriented. No focal neurological deficits. Extremities/musculoskeletal: Bilateral lower extremity bandages are on, not taken off. Will examine at the next dressing change. Skin: Bilateral lower extremity bandages are on; not taken off. Will examine at the next dressing change. Psychiatry: Judgement and insight appear normal. Mood & affect appropriate.     Data  Reviewed: I have personally reviewed following labs and imaging studies  CBC:  Recent Labs Lab 12/21/15 1534 12/22/15 0509 12/23/15 0531  WBC 18.6* 12.0* 9.6  NEUTROABS 16.0*  --   --   HGB 12.0* 10.2* 10.0*  HCT 37.8* 32.6* 32.7*  MCV 90.9 91.1 92.4  PLT 411* 299 256   Basic Metabolic Panel:  Recent Labs Lab 12/21/15 1534 12/22/15 0509 12/23/15 0531  NA 137 136 137  K 4.9 4.1 3.8  CL 101 104 107  CO2 28 25 23   GLUCOSE 89 100* 73  BUN 29* 25* 17  CREATININE 0.90 0.85 0.73  CALCIUM 8.7* 8.0* 7.9*   GFR: Estimated Creatinine Clearance: 136 mL/min (by C-G formula based on SCr of 0.73 mg/dL). Liver Function Tests:  Recent Labs Lab 12/22/15 0509  AST 19  ALT 13*  ALKPHOS 77  BILITOT 0.7  PROT 6.1*  ALBUMIN 2.0*   No results for input(s): LIPASE, AMYLASE in the last 168 hours. No results for input(s): AMMONIA in the last 168 hours. Coagulation Profile: No results for input(s): INR, PROTIME in the last 168 hours. Cardiac Enzymes: No results for input(s): CKTOTAL, CKMB, CKMBINDEX, TROPONINI in the last 168 hours. BNP (last 3 results) No  results for input(s): PROBNP in the last 8760 hours. HbA1C: No results for input(s): HGBA1C in the last 72 hours. CBG:  Recent Labs Lab 12/22/15 0730 12/22/15 1134 12/22/15 1640 12/22/15 2135 12/23/15 0722  GLUCAP 74 95 110* 96 69   Lipid Profile: No results for input(s): CHOL, HDL, LDLCALC, TRIG, CHOLHDL, LDLDIRECT in the last 72 hours. Thyroid Function Tests: No results for input(s): TSH, T4TOTAL, FREET4, T3FREE, THYROIDAB in the last 72 hours. Anemia Panel: No results for input(s): VITAMINB12, FOLATE, FERRITIN, TIBC, IRON, RETICCTPCT in the last 72 hours. Sepsis Labs:  Recent Labs Lab 12/21/15 1552  LATICACIDVEN 1.31    Recent Results (from the past 240 hour(s))  Culture, blood (routine x 2)     Status: None (Preliminary result)   Collection Time: 12/21/15  3:35 PM  Result Value Ref Range Status    Specimen Description BLOOD RIGHT HAND  Final   Special Requests BOTTLES DRAWN AEROBIC AND ANAEROBIC 7CC EACH  Final   Culture NO GROWTH < 24 HOURS  Final   Report Status PENDING  Incomplete  Culture, blood (routine x 2)     Status: None (Preliminary result)   Collection Time: 12/21/15  3:43 PM  Result Value Ref Range Status   Specimen Description BLOOD LEFT HAND  Final   Special Requests BOTTLES DRAWN AEROBIC ONLY 6CC  Final   Culture NO GROWTH < 24 HOURS  Final   Report Status PENDING  Incomplete         Radiology Studies: No results found.      Scheduled Meds: . aspirin EC  81 mg Oral QPM  .  ceFAZolin (ANCEF) IV  2 g Intravenous Q8H  . dabigatran  150 mg Oral Q12H  . digoxin  0.125 mg Oral Daily  . docusate sodium  100 mg Oral BID  . fluconazole  100 mg Oral Daily  . insulin aspart  0-5 Units Subcutaneous QHS  . insulin aspart  0-9 Units Subcutaneous TID WC  . insulin glargine  20 Units Subcutaneous QHS  . metFORMIN  1,000 mg Oral QPM  . nystatin ointment   Topical BID  . pravastatin  20 mg Oral q1800  . verapamil  80 mg Oral Q8H   Continuous Infusions: . sodium chloride 125 mL/hr (12/22/15 0957)     LOS: 2 days    Time spent: 35 minutes    [name], MD [facility] Pager [phone]  If 7PM-7AM, please contact night-coverage [email or web address] Password [facility] 12/23/2015, 8:23 AM

## 2015-12-23 NOTE — Progress Notes (Signed)
PT Cancellation Note  Patient Details Name: Jonathan HibbsRobert L Wise MRN: [id number] DOB: 06/16/1945   Cancelled Treatment:    Reason Eval/Treat Not Completed: Patient not medically ready (Pt continues to have an elevated HR at 123bpm while resting supine in bed.  Pt also expressed that he is having R foot pain that he noticed when he pushed against the end of the bed to scoot up.  RN is aware.  Will check back tomorrow. )   Beth Ryin Schillo, PT, DPT X: [phone]

## 2015-12-24 DIAGNOSIS — I8311 Varicose veins of right lower extremity with inflammation: Secondary | ICD-10-CM

## 2015-12-24 LAB — GLUCOSE, CAPILLARY
GLUCOSE-CAPILLARY: 89 mg/dL (ref 65–99)
GLUCOSE-CAPILLARY: 85 mg/dL (ref 65–99)
GLUCOSE-CAPILLARY: 134 mg/dL — AB (ref 65–99)
GLUCOSE-CAPILLARY: 91 mg/dL (ref 65–99)

## 2015-12-24 MED ORDER — METOPROLOL TARTRATE 5 MG/5ML IV SOLN
5.0000 mg | Freq: Once | INTRAVENOUS | Status: AC
  Administered 2015-12-24: 5 mg via INTRAVENOUS
  Filled 2015-12-24: qty 5

## 2015-12-24 MED ORDER — METOPROLOL TARTRATE 25 MG PO TABS
12.5000 mg | ORAL_TABLET | Freq: Three times a day (TID) | ORAL | Status: DC
  Administered 2015-12-24 – 2015-12-25 (×6): 12.5 mg via ORAL
  Filled 2015-12-24 (×6): qty 1

## 2015-12-24 MED ORDER — DIGOXIN 125 MCG PO TABS
0.2500 mg | ORAL_TABLET | Freq: Every day | ORAL | Status: DC
Start: 2015-12-24 — End: 2015-12-27
  Administered 2015-12-24 – 2015-12-27 (×4): 0.25 mg via ORAL
  Filled 2015-12-24 (×4): qty 2

## 2015-12-24 NOTE — Care Management Important Message (Signed)
Important Message  Patient Details  Name: Ferd HibbsRobert L Sandt MRN: [id number] Date of Birth: [birth year]   Medicare Important Message Given:  Yes    [name], Lenis Nettleton Demske, RN 12/24/2015, 1:11 PM

## 2015-12-24 NOTE — Evaluation (Signed)
Physical Therapy Evaluation Patient Details Name: Jonathan HibbsRobert L Wise MRN: [id number] DOB: [birth year] Today's Date: 12/24/2015   History of Present Illness  70 y.o. male with hx of morbid obesity, DM2, chronic afib, chronic venous LE stasis , recurrent LE cellulitis presented with gen'd weakness, falls, chills and worsening leg pain L > R over the last 1-2 weeks.  Was admitted here 3 wks ago for 4 days for LLE cellulitis rx'd with vanc/ zosyn and dc'd on po doxy 5 more days.  Completed abx. Now L leg is hurting again and pt reporting chills and fevers up to 101 deg off and on for the last 2 weeks.  Hasn't been eating well.  In ED WBC is up 18k, BP's were good then dropped to 90's, better now w IVF bolus.  Asked to see for admission.  Dx: Cellulitis - with ^WBC/ pain LLE.  Af0b with RVR.  PMH: AFIB, B LE edema, DVT L LE, HTN, morbid obesity, Pulmonary artery HTN, DM2, venous stasis ulcers, hernia repair.   Clinical Impression  Pt received in bed, and is agreeable to bed level PT evaluation.  Pt states that he has had multiple falls over the past 6 months, and has been having great difficulty with his mobility for the past 3 weeks - to the point where his wife has to follow him with a stool for when he needs to sit down.  Pt was able to perform bed level exercises, however he requires Max A for supine scoot for positioning in the bed. At this point he is recommended for SNF due to high fall risk, weakness and immobility.     Follow Up Recommendations SNF    Equipment Recommendations  None recommended by PT    Recommendations for Other Services       Precautions / Restrictions Precautions Precautions: Fall Precaution Comments: Pt has had 10-11 falls within the last 6 months.  EMS had to come out 2 times.  Restrictions Weight Bearing Restrictions: No      Mobility  Bed Mobility Overal bed mobility: Needs Assistance (Pt declined transfer supine<>sit today due to needing to get ahold of the wound  care nurses to change the bandages. )             General bed mobility comments: Max A for supine scoot.  Pt is able to assist with R UE, and PT assists with pull pad to get pt [facility]    Transfers                    Ambulation/Gait                Stairs            Wheelchair Mobility    Modified Rankin (Stroke Patients Only)       Balance                                             Pertinent Vitals/Pain Pain Assessment: 0-10 Pain Score: 1  Pain Location: R ankle Pain Descriptors / Indicators: Throbbing Pain Intervention(s): Limited activity within patient's tolerance;Monitored during session;Repositioned    Home Living   Living Arrangements: Spouse/significant other;Other relatives (oldest granddaughter) Available Help at Discharge:  (Had HH coming out to see him) Type of Home: House Home Access: Ramped entrance     Home  Layout: One level Home Equipment: Toilet riser;Walker - 2 wheels;Cane - single point;Bedside commode;Shower seat (pt has been working on a [employer] with the physician.  )      Prior Function     Gait / Transfers Assistance Needed: Pt states he wasn't getting around well.  He was using the RW. He fell getting out of the car after being d/c from the hospital.  [name] had to be right there with him when he was moving because his LE's would give out.    ADL's / Homemaking Assistance Needed: Pt was independent with dressing, and he had assistance with bathing.          Hand Dominance   Dominant Hand: Right    Extremity/Trunk Assessment   Upper Extremity Assessment: Generalized weakness;LUE deficits/detail       LUE Deficits / Details: Pt states that he can't lift his L UE up too far because "it might pop out of socket." Pt able to raise up to ~120* of flexion.    Lower Extremity Assessment: Generalized weakness;RLE deficits/detail;LLE deficits/detail RLE Deficits /  Details: B hip and knee flexion limited due to body habitus, as well as skin integrity with weeping wounds covered with wraps.  LLE Deficits / Details: B hip and knee flexion limited due to body habitus, as well as skin integrity with weeping wounds covered with wraps.      Communication   Communication: No difficulties  Cognition Arousal/Alertness: Awake/alert Behavior During Therapy: WFL for tasks assessed/performed Overall Cognitive Status: Within Functional Limits for tasks assessed                      General Comments      Exercises General Exercises - Lower Extremity Ankle Circles/Pumps: AROM;Both;10 reps;Supine Short Arc Quad: Strengthening;Both;10 reps;Supine Heel Slides: Strengthening;Both;10 reps;Supine;Limitations Heel Slides Limitations: Unable to keep toes point up towards the ceiling due to weakness. Hip ABduction/ADduction: Strengthening;Both;10 reps;Supine;Limitations Hip Abduction/Adduction Limitations: Min A for L LE.  Use of blue pad to assist LE into abduction/adduction.  Straight Leg Raises: Strengthening;Both;10 reps;Supine;Limitations Straight Leg Raises Limitations: Min A for B LE's with use of blue pad to raise LE.     Assessment/Plan    PT Assessment Patient needs continued PT services  PT Problem List Decreased range of motion;Decreased strength;Decreased activity tolerance;Decreased mobility;Decreased safety awareness;Decreased knowledge of use of DME;Decreased knowledge of precautions;Cardiopulmonary status limiting activity;Decreased skin integrity;Obesity          PT Treatment Interventions DME instruction;Gait training;Functional mobility training;Therapeutic activities;Therapeutic exercise;Balance training;Patient/family education    PT Goals (Current goals can be found in the Care Plan section)  Acute Rehab PT Goals Patient Stated Goal: Pt would like to get stronger and stop falling.  PT Goal Formulation: With patient Time For Goal  Achievement: 01/07/16 Potential to Achieve Goals: Fair    Frequency Min 3X/week   Barriers to discharge        Co-evaluation               End of Session   Activity Tolerance: Patient tolerated treatment well Patient left: in bed;with call bell/phone within reach      Functional Assessment Tool Used: [employer] AM-PAC "6-clicks"  Functional Limitation: Mobility: Walking and moving around Mobility: Walking and Moving Around Current Status [phone]): At least 80 percent but less than 100 percent impaired, limited or restricted Mobility: Walking and Moving Around Goal Status [phone]): At least 60 percent but less than 80 percent impaired,  limited or restricted    Time: 1505-1530 PT Time Calculation (min) (ACUTE ONLY): 25 min   Visit Diagnosis:     Cellulitis of L LEG 682.6, L03.116       Muscle weakness (generalized) 728.87, M62.81  Charges:   PT Evaluation $PT Eval Moderate Complexity: 1 Procedure PT Treatments $Therapeutic Exercise: 8-22 mins   PT G Codes:   PT G-Codes **NOT FOR INPATIENT CLASS** Functional Assessment Tool Used: [employer] "6-clicks"  Functional Limitation: Mobility: Walking and moving around Mobility: Walking and Moving Around Current Status [phone]): At least 80 percent but less than 100 percent impaired, limited or restricted Mobility: Walking and Moving Around Goal Status [phone]): At least 60 percent but less than 80 percent impaired, limited or restricted   Beth Zareen Jamison, PT, DPT X: [phone]

## 2015-12-24 NOTE — Progress Notes (Signed)
PROGRESS NOTE    Jonathan HibbsRobert L Wise  ZOX:[id number] DOB: 1946/02/17 DOA: 12/21/2015 PCP: [name],[name], MD    Brief Narrative:  Patient is a 70 yo man with history of chronic atrial fibrillation, diabetes mellitus, hypertension, morbid obesity, chronic venous stasis of his lower extremities, and recurrent lower extremity cellulitis, who presented on 12/21/2015 with generalized weakness, falls, bilateral leg pain with associated redness, and chills. He was just discharged 12/01/2015 for treatment of left lower extremity cellulitis/venous stasis ulcers and A. fib with RVR. In the ED, he was afebrile and tachycardic with a heart rate in the 120s. His initial blood pressure was in the 130s, but fell to the 90s systolically. His white blood cell count was elevated at 18.6. He was admitted for further evaluation and management.   Assessment & Plan:   Principal Problem:   Cellulitis of left leg Active Problems:   Chronic venous stasis dermatitis   Atrial fibrillation with rapid ventricular response (HCC)   Type 2 diabetes mellitus without complication (HCC)   Current use of long term anticoagulation   Hypotension   Morbid obesity (HCC)   Leukocytosis   Volume depletion   Cellulitis   Candidal intertrigo    1. Recurrent left greater than right bilateral lower extremity cellulitis with venous stasis changes. The patient was treated approximately 3 weeks ago for the same. He was discharged on doxycycline which he completed. He is also treated with special dressings for chronic venous stasis changes. -He was started on cefazolin. This will be continued for now, but will consider adding vancomycin. - Diflucan and topical nystatin ointment were started for probable superimposed Candida intertrigo/cellulitis. -Wound care was consulted. Recommendations for dressing management noted and appreciated. -His blood pressure has improved. His white blood cell count has improved and normalized.  Candida  intertrigo. -Management as above with topical nystatin and oral Diflucan 3 days. -There is less erythema of his thighs, pannus and perineum.  A. fib with RVR. Patient is treated chronically with verapamil and Pradaxa. Metoprolol was added to his discharge medications during the previous hospitalization, but he discontinued it due to symptomatic low blood pressures. His heart rate was in the 120s on admission. It continues to be in the low 100s to the 120s. He has no chest pain. -Due to low-normal blood pressures on admission, verapamil was changed to every 8 hour dosing. Digoxin was started at 0.125 mg-will titrate up to 0.25 mg today 9/15. -His blood pressure has improved. -Verapamil was discontinued on 9/14 and started Cardizem at 90 mg every 8 hours.  -We will give 5 mg IV metoprolol and start 3 times a day dosing of metoprolol with close monitoring of his blood pressure.   Hypotension secondary to hypovolemia/chronic hypertension. Patient is treated chronically with lisinopril and verapamil. -Due to low-normal blood pressures on admission, lisinopril was held. He was started on IV fluids for hydration. His blood pressure has improved, but will hold lisinopril as rate limiting medications are being titrated. -Will decrease the rate of IV fluids.  Type 2 diabetes mellitus. The patient is treated chronically with Degludec insulin 30 units every evening, metformin, Invokana, and Actos. His hemoglobin A1c during the previous hospitalization was 5.7. It was suggested that Actos could be discontinued at that time, but apparently he was still taking it. -Due to his relatively normal CBGs in the setting of an illness,  Invokana, and Actos are being held. Continue metformin and Lantus. Sliding scale NovoLog was started.  Generalized weakness. Patient has had  generalized weakness, particularly in his legs and progressive debilitation. He has been morbidly obese for years and it may be taking a toll  on his joints and his conditioning. His recurrent lower extremity cellulitis is certainly contributing. His TSH was within normal limits during the previous hospitalization. -Physical therapy ordered for evaluation. I strongly believe that the patient would benefit from short-term rehabilitation in a SNF.     DVT prophylaxis: Pradaxa Code Status: Full code Family Communication: Family not available Disposition Plan: Discharge when clinically appropriate, likely in a few days. ? SNF.   Consultants:   Wound care  Procedures:   None  Antimicrobials:   Cefazolin 9/12>>   Antifungal Diflucan 9/13/173 days.   Subjective: Patient denies chest pain or palpitations. He has less discomfort in his legs.  Objective: Vitals:   12/23/15 2200 12/24/15 0000 12/24/15 0641 12/24/15 0731  BP:  (!) 129/51 137/68 132/64  Pulse:      Resp: (!) 25 (!) 28  (!) 27  Temp:    98.1 F (36.7 C)  TempSrc:    Oral  SpO2:      Weight:      Height:      On telemetry in the room heart rate 110 and 1:15.  Intake/Output Summary (Last 24 hours) at 12/24/15 0902 Last data filed at 12/23/15 2200  Gross per 24 hour  Intake           5502.5 ml  Output                0 ml  Net           5502.5 ml   Filed Weights   12/22/15 0900 12/23/15 0500  Weight: (!) 175.6 kg (387 lb 2 oz) (!) 163.3 kg (360 lb)    Examination:  General exam: Appears calm and comfortable. He looks better.  Respiratory system: Clear to auscultation. Respiratory effort normal. Cardiovascular system: Irregular, irregular with tachycardia. Trace pedal edema. Gastrointestinal system: Abdomen is obese, nondistended, soft and nontender. No organomegaly or masses felt. Normal bowel sounds heard. Central nervous system: Alert and oriented. No focal neurological deficits. Extremities/musculoskeletal: Bilateral lower extremity bandages are on, not taken off. Will examine at the next dressing change. Skin: Bilateral lower extremity  bandages are on; not taken off. Will examine at the next dressing change. Psychiatry: Judgement and insight appear normal. Mood & affect appropriate.     Data Reviewed: I have personally reviewed following labs and imaging studies  CBC:  Recent Labs Lab 12/21/15 1534 12/22/15 0509 12/23/15 0531  WBC 18.6* 12.0* 9.6  NEUTROABS 16.0*  --   --   HGB 12.0* 10.2* 10.0*  HCT 37.8* 32.6* 32.7*  MCV 90.9 91.1 92.4  PLT 411* 299 256   Basic Metabolic Panel:  Recent Labs Lab 12/21/15 1534 12/22/15 0509 12/23/15 0531  NA 137 136 137  K 4.9 4.1 3.8  CL 101 104 107  CO2 28 25 23   GLUCOSE 89 100* 73  BUN 29* 25* 17  CREATININE 0.90 0.85 0.73  CALCIUM 8.7* 8.0* 7.9*   GFR: Estimated Creatinine Clearance: 136 mL/min (by C-G formula based on SCr of 0.73 mg/dL). Liver Function Tests:  Recent Labs Lab 12/22/15 0509  AST 19  ALT 13*  ALKPHOS 77  BILITOT 0.7  PROT 6.1*  ALBUMIN 2.0*   No results for input(s): LIPASE, AMYLASE in the last 168 hours. No results for input(s): AMMONIA in the last 168 hours. Coagulation Profile: No results for input(s):  INR, PROTIME in the last 168 hours. Cardiac Enzymes: No results for input(s): CKTOTAL, CKMB, CKMBINDEX, TROPONINI in the last 168 hours. BNP (last 3 results) No results for input(s): PROBNP in the last 8760 hours. HbA1C: No results for input(s): HGBA1C in the last 72 hours. CBG:  Recent Labs Lab 12/23/15 0722 12/23/15 1144 12/23/15 1703 12/23/15 2129 12/24/15 0730  GLUCAP 69 90 80 110* 89   Lipid Profile: No results for input(s): CHOL, HDL, LDLCALC, TRIG, CHOLHDL, LDLDIRECT in the last 72 hours. Thyroid Function Tests: No results for input(s): TSH, T4TOTAL, FREET4, T3FREE, THYROIDAB in the last 72 hours. Anemia Panel: No results for input(s): VITAMINB12, FOLATE, FERRITIN, TIBC, IRON, RETICCTPCT in the last 72 hours. Sepsis Labs:  Recent Labs Lab 12/21/15 1552  LATICACIDVEN 1.31    Recent Results (from the  past 240 hour(s))  Culture, blood (routine x 2)     Status: None (Preliminary result)   Collection Time: 12/21/15  3:35 PM  Result Value Ref Range Status   Specimen Description BLOOD RIGHT HAND  Final   Special Requests BOTTLES DRAWN AEROBIC AND ANAEROBIC 7CC EACH  Final   Culture NO GROWTH 2 DAYS  Final   Report Status PENDING  Incomplete  Culture, blood (routine x 2)     Status: None (Preliminary result)   Collection Time: 12/21/15  3:43 PM  Result Value Ref Range Status   Specimen Description BLOOD LEFT HAND  Final   Special Requests BOTTLES DRAWN AEROBIC ONLY 6CC  Final   Culture NO GROWTH 2 DAYS  Final   Report Status PENDING  Incomplete         Radiology Studies: No results found.      Scheduled Meds: . aspirin EC  81 mg Oral QPM  .  ceFAZolin (ANCEF) IV  2 g Intravenous Q8H  . dabigatran  150 mg Oral Q12H  . digoxin  0.25 mg Oral Daily  . diltiazem  90 mg Oral Q8H  . docusate sodium  100 mg Oral BID  . fluconazole  100 mg Oral Daily  . insulin aspart  0-5 Units Subcutaneous QHS  . insulin aspart  0-9 Units Subcutaneous TID WC  . insulin glargine  20 Units Subcutaneous QHS  . metFORMIN  1,000 mg Oral QPM  . metoprolol  5 mg Intravenous Once  . nystatin ointment   Topical BID  . pravastatin  20 mg Oral q1800   Continuous Infusions: . sodium chloride 125 mL/hr at 12/23/15 2200     LOS: 3 days    Time spent: 30 minutes    [name], MD [facility] Pager [phone]  If 7PM-7AM, please contact night-coverage [email or web address] Password [email or web address] 12/24/2015, 9:02 AM

## 2015-12-25 DIAGNOSIS — D638 Anemia in other chronic diseases classified elsewhere: Secondary | ICD-10-CM | POA: Diagnosis present

## 2015-12-25 DIAGNOSIS — Z7901 Long term (current) use of anticoagulants: Secondary | ICD-10-CM

## 2015-12-25 LAB — BASIC METABOLIC PANEL
Anion gap: 6 (ref 5–15)
BUN: 12 mg/dL (ref 6–20)
CALCIUM: 8.2 mg/dL — AB (ref 8.9–10.3)
CO2: 27 mmol/L (ref 22–32)
Chloride: 105 mmol/L (ref 101–111)
Creatinine, Ser: 0.65 mg/dL (ref 0.61–1.24)
GFR calc non Af Amer: >60 mL/min (ref >60)
Glucose, Bld: 100 mg/dL — ABNORMAL HIGH (ref 65–99)
Potassium: 3.9 mmol/L (ref 3.5–5.1)
SODIUM: 138 mmol/L (ref 135–145)

## 2015-12-25 LAB — GLUCOSE, CAPILLARY
GLUCOSE-CAPILLARY: 101 mg/dL — AB (ref 65–99)
GLUCOSE-CAPILLARY: 138 mg/dL — AB (ref 65–99)
Glucose-Capillary: 107 mg/dL — ABNORMAL HIGH (ref 65–99)
Glucose-Capillary: 142 mg/dL — ABNORMAL HIGH (ref 65–99)

## 2015-12-25 MED ORDER — POLYETHYLENE GLYCOL 3350 17 G PO PACK
17.0000 g | PACK | Freq: Every day | ORAL | Status: DC
  Administered 2015-12-25 – 2015-12-27 (×2): 17 g via ORAL
  Filled 2015-12-25 (×3): qty 1

## 2015-12-25 MED ORDER — TRAMADOL HCL 50 MG PO TABS
50.0000 mg | ORAL_TABLET | Freq: Four times a day (QID) | ORAL | Status: DC | PRN
  Administered 2015-12-25: 50 mg via ORAL
  Filled 2015-12-25: qty 1

## 2015-12-25 MED ORDER — NYSTATIN 100000 UNIT/GM EX OINT
TOPICAL_OINTMENT | CUTANEOUS | Status: AC
  Filled 2015-12-25: qty 15

## 2015-12-25 NOTE — Progress Notes (Signed)
PROGRESS NOTE    Jonathan Wise  ZOX:[id number] DOB: 1945-08-01 DOA: 12/21/2015 PCP: [name],[name], MD    Brief Narrative:  Patient is a 70 yo man with history of chronic atrial fibrillation, diabetes mellitus, hypertension, morbid obesity, chronic venous stasis of his lower extremities, and recurrent lower extremity cellulitis, who presented on 12/21/2015 with generalized weakness, falls, bilateral leg pain with associated redness, and chills. He was just discharged 12/01/2015 for treatment of left lower extremity cellulitis/venous stasis ulcers and A. fib with RVR. In the ED, he was afebrile and tachycardic with a heart rate in the 120s. His initial blood pressure was in the 130s, but fell to the 90s systolically. His white blood cell count was elevated at 18.6. He was admitted for further evaluation and management.   Assessment & Plan:   Principal Problem:   Cellulitis of left leg Active Problems:   Chronic venous stasis dermatitis   Atrial fibrillation with rapid ventricular response (HCC)   Type 2 diabetes mellitus without complication (HCC)   Current use of long term anticoagulation   Hypotension   Morbid obesity (HCC)   Leukocytosis   Volume depletion   Cellulitis   Candidal intertrigo   Anemia of chronic disease    1. Recurrent left greater than right bilateral lower extremity cellulitis with venous stasis changes. The patient was treated approximately 3 weeks ago for the same. He was discharged on doxycycline which he completed. He is also treated with special dressings for chronic venous stasis changes. -He was started on cefazolin. This will be continued for now, but will consider adding vancomycin. - Diflucan and topical nystatin ointment were started for superimposed Candida intertrigo/cellulitis. -Wound care was consulted. Recommendations for dressing management noted and appreciated. The patient is treated with chronic Unna boots in the outpatient setting. I  wonder if they are contributing to his recurrent cellulitis. This will be discussed with the wound care nurse when available. -His blood pressure has improved. His white blood cell count has improved and normalized.  Candida intertrigo. -Management as above with topical nystatin and oral Diflucan 3 days. -There is less erythema of his thighs, pannus and perineum.  A. fib with RVR. Patient is treated chronically with verapamil and Pradaxa. Metoprolol was added to his discharge medications during the previous hospitalization, but he discontinued it due to symptomatic low blood pressures. His heart rate was in the 120s on admission. It continues to be in the low 100s to the 120s. He has no chest pain. -Due to low-normal blood pressures on admission, verapamil was changed to every 8 hour dosing. Digoxin was started at 0.125 mg and then titrated up to 0.25 mg. Verapamil was discontinued on 9/14 and Cardizem 90 mg every 8 hours was started. Metoprolol was started at 12.5 mg 3 times a day. -His heart rate is trending down. He continues to be asymptomatic. -We'll transfer him to telemetry out of the stepdown unit today. -His blood pressure has improved.  Hypotension secondary to hypovolemia/chronic hypertension. Patient is treated chronically with lisinopril and verapamil. -Due to low-normal blood pressures on admission, lisinopril was held. He was started on IV fluids for hydration. His blood pressure has improved, but will continue to hold lisinopril as rate limiting medications are being titrated.  Type 2 diabetes mellitus. The patient is treated chronically with Degludec insulin 30 units every evening, metformin, Invokana, and Actos. His hemoglobin A1c during the previous hospitalization was 5.7. It was suggested that Actos could be discontinued at that time, but  apparently he was still taking it. -Due to his relatively normal CBGs in the setting of an illness,  Invokana, and Actos are being held.  Continue metformin and Lantus. Sliding scale NovoLog was started. His CBGs continue to be well controlled.  Generalized weakness. Patient has had generalized weakness, particularly in his legs and progressive debilitation. He has been morbidly obese for years and it may be taking a toll on his joints and his conditioning. His recurrent lower extremity cellulitis is certainly contributing. His TSH was within normal limits during the previous hospitalization. -Physical therapy evaluated the patient and recommended SNF. Agree.      DVT prophylaxis: Pradaxa Code Status: Full code Family Communication: Family not available Disposition Plan: Discharge when clinically appropriate, likely in a few days. ? SNF.   Consultants:   Wound care  Procedures:   None  Antimicrobials:   Cefazolin 9/12>>   Antifungal Diflucan 9/13/173 days.   Subjective: Patient complains of constipation, otherwise no other complaints..  Objective: Vitals:   12/25/15 0400 12/25/15 0500 12/25/15 0600 12/25/15 0719  BP: (!) 145/68 135/62 137/64 (!) 108/47  Pulse:    (!) 118  Resp: (!) 35 (!) 31 (!) 28 (!) 28  Temp: 97.6 F (36.4 C)   98.5 F (36.9 C)  TempSrc: Oral   Oral  SpO2:    94%  Weight: (!) 167 kg (368 lb 2.7 oz)     Height:      On telemetry in the room heart rate 89-120.  Intake/Output Summary (Last 24 hours) at 12/25/15 0753 Last data filed at 12/25/15 0747  Gross per 24 hour  Intake          3488.75 ml  Output             1550 ml  Net          1938.75 ml   Filed Weights   12/22/15 0900 12/23/15 0500 12/25/15 0400  Weight: (!) 175.6 kg (387 lb 2 oz) (!) 163.3 kg (360 lb) (!) 167 kg (368 lb 2.7 oz)    Examination:  General exam: Appears calm and comfortable. He looks better.  Respiratory system: Clear to auscultation. Respiratory effort normal. Cardiovascular system: Irregular, irregular withBorderline tachycardia. Trace pedal edema. Gastrointestinal system: Abdomen is obese,  nondistended, soft and nontender. No organomegaly or masses felt. Normal bowel sounds heard. Central nervous system: Alert and oriented. No focal neurological deficits. Extremities/musculoskeletal: Bilateral lower extremity bandages are on, not taken off. Will examine at the next dressing change. Skin: Bilateral lower extremity bandages are on; not taken off. Will examine at the next dressing change. Psychiatry: Judgement and insight appear normal. Mood & affect appropriate.     Data Reviewed: I have personally reviewed following labs and imaging studies  CBC:  Recent Labs Lab 12/21/15 1534 12/22/15 0509 12/23/15 0531  WBC 18.6* 12.0* 9.6  NEUTROABS 16.0*  --   --   HGB 12.0* 10.2* 10.0*  HCT 37.8* 32.6* 32.7*  MCV 90.9 91.1 92.4  PLT 411* 299 256   Basic Metabolic Panel:  Recent Labs Lab 12/21/15 1534 12/22/15 0509 12/23/15 0531 12/25/15 0432  NA 137 136 137 138  K 4.9 4.1 3.8 3.9  CL 101 104 107 105  CO2 28 25 23 27   GLUCOSE 89 100* 73 100*  BUN 29* 25* 17 12  CREATININE 0.90 0.85 0.73 0.65  CALCIUM 8.7* 8.0* 7.9* 8.2*   GFR: Estimated Creatinine Clearance: 137.8 mL/min (by C-G formula based on SCr of 0.65  mg/dL). Liver Function Tests:  Recent Labs Lab 12/22/15 0509  AST 19  ALT 13*  ALKPHOS 77  BILITOT 0.7  PROT 6.1*  ALBUMIN 2.0*   No results for input(s): LIPASE, AMYLASE in the last 168 hours. No results for input(s): AMMONIA in the last 168 hours. Coagulation Profile: No results for input(s): INR, PROTIME in the last 168 hours. Cardiac Enzymes: No results for input(s): CKTOTAL, CKMB, CKMBINDEX, TROPONINI in the last 168 hours. BNP (last 3 results) No results for input(s): PROBNP in the last 8760 hours. HbA1C: No results for input(s): HGBA1C in the last 72 hours. CBG:  Recent Labs Lab 12/24/15 0730 12/24/15 1104 12/24/15 1715 12/24/15 2100 12/25/15 0719  GLUCAP 89 85 134* 91 101*   Lipid Profile: No results for input(s): CHOL, HDL,  LDLCALC, TRIG, CHOLHDL, LDLDIRECT in the last 72 hours. Thyroid Function Tests: No results for input(s): TSH, T4TOTAL, FREET4, T3FREE, THYROIDAB in the last 72 hours. Anemia Panel: No results for input(s): VITAMINB12, FOLATE, FERRITIN, TIBC, IRON, RETICCTPCT in the last 72 hours. Sepsis Labs:  Recent Labs Lab 12/21/15 1552  LATICACIDVEN 1.31    Recent Results (from the past 240 hour(s))  Culture, blood (routine x 2)     Status: None (Preliminary result)   Collection Time: 12/21/15  3:35 PM  Result Value Ref Range Status   Specimen Description BLOOD RIGHT HAND  Final   Special Requests BOTTLES DRAWN AEROBIC AND ANAEROBIC 7CC EACH  Final   Culture NO GROWTH 3 DAYS  Final   Report Status PENDING  Incomplete  Culture, blood (routine x 2)     Status: None (Preliminary result)   Collection Time: 12/21/15  3:43 PM  Result Value Ref Range Status   Specimen Description BLOOD LEFT HAND  Final   Special Requests BOTTLES DRAWN AEROBIC ONLY 6CC  Final   Culture NO GROWTH 3 DAYS  Final   Report Status PENDING  Incomplete         Radiology Studies: No results found.      Scheduled Meds: . aspirin EC  81 mg Oral QPM  .  ceFAZolin (ANCEF) IV  2 g Intravenous Q8H  . dabigatran  150 mg Oral Q12H  . digoxin  0.25 mg Oral Daily  . diltiazem  90 mg Oral Q8H  . insulin aspart  0-5 Units Subcutaneous QHS  . insulin aspart  0-9 Units Subcutaneous TID WC  . insulin glargine  20 Units Subcutaneous QHS  . metFORMIN  1,000 mg Oral QPM  . metoprolol tartrate  12.5 mg Oral TID  . nystatin ointment   Topical BID  . polyethylene glycol  17 g Oral Daily  . pravastatin  20 mg Oral q1800   Continuous Infusions: . sodium chloride 50 mL/hr at 12/24/15 1859     LOS: 4 days    Time spent: 30 minutes    [name], MD [facility] Pager [phone]  If 7PM-7AM, please contact night-coverage [email or web address] Password [email or web address] 12/25/2015, 7:53 AM

## 2015-12-25 NOTE — Progress Notes (Signed)
Notified MD, pt requesting pain medication for acute pain in his legs. Awaiting response.

## 2015-12-26 LAB — GLUCOSE, CAPILLARY
GLUCOSE-CAPILLARY: 100 mg/dL — AB (ref 65–99)
Glucose-Capillary: 115 mg/dL — ABNORMAL HIGH (ref 65–99)
Glucose-Capillary: 99 mg/dL (ref 65–99)
Glucose-Capillary: 200 mg/dL — ABNORMAL HIGH (ref 65–99)

## 2015-12-26 MED ORDER — METOPROLOL TARTRATE 25 MG PO TABS
25.0000 mg | ORAL_TABLET | Freq: Two times a day (BID) | ORAL | Status: DC
  Administered 2015-12-26 – 2015-12-27 (×3): 25 mg via ORAL
  Filled 2015-12-26 (×3): qty 1

## 2015-12-26 NOTE — Progress Notes (Signed)
PROGRESS NOTE    Jonathan HibbsRobert L Wise  ZOX:[id number] DOB: 1945-08-01 DOA: 12/21/2015 PCP: [name],Jonathan HENRY, MD    Brief Narrative:  Patient is a 70 yo man with history of chronic atrial fibrillation, diabetes mellitus, hypertension, morbid obesity, chronic venous stasis of his lower extremities, and recurrent lower extremity cellulitis, who presented on 12/21/2015 with generalized weakness, falls, bilateral leg pain with associated redness, and chills. He was just discharged 12/01/2015 for treatment of left lower extremity cellulitis/venous stasis ulcers and A. fib with RVR. In the ED, he was afebrile and tachycardic with a heart rate in the 120s. His initial blood pressure was in the 130s, but fell to the 90s systolically. His white blood cell count was elevated at 18.6. He was admitted for further evaluation and management.   Assessment & Plan:   Principal Problem:   Cellulitis of left leg Active Problems:   Chronic venous stasis dermatitis   Atrial fibrillation with rapid ventricular response (HCC)   Type 2 diabetes mellitus without complication (HCC)   Current use of long term anticoagulation   Hypotension   Morbid obesity (HCC)   Leukocytosis   Volume depletion   Cellulitis   Candidal intertrigo   Anemia of chronic disease    1. Recurrent left greater than right bilateral lower extremity cellulitis with venous stasis changes. The patient was treated approximately 3 weeks ago for the same. He was discharged on doxycycline which he completed. He is also treated with special dressings for chronic venous stasis changes. -He was started on cefazolin. This will be continued for now, but will consider adding vancomycin. - Diflucan and topical nystatin ointment were started for superimposed Candida intertrigo/cellulitis. -Wound care was consulted. Recommendations for dressing management noted and appreciated. The patient is treated with chronic Unna boots in the outpatient setting. I  wonder if they are contributing to his recurrent cellulitis. This will be discussed with the wound care nurse when available. -His blood pressure has improved. His white blood cell count has improved and normalized.  Candida intertrigo. -Management as above with topical nystatin and oral Diflucan 3 days. -There is less erythema of his thighs, pannus and perineum.  A. fib with RVR. Patient is treated chronically with verapamil and Pradaxa. Metoprolol was added to his discharge medications during the previous hospitalization, but he discontinued it due to symptomatic low blood pressures. His heart rate was in the 120s on admission. It continues to be in the low 100s to the 120s. He has no chest pain. -Due to low-normal blood pressures on admission, verapamil was changed to every 8 hour dosing. Digoxin was started at 0.125 mg and then titrated up to 0.25 mg. Verapamil was discontinued on 9/14 and Cardizem 90 mg every 8 hours was started. Metoprolol was started at 12.5 mg 3 times a day. -His heart rate is trending down. He continues to be asymptomatic. -We'll transfer him to telemetry out of the stepdown unit today. -His blood pressure has improved.  Hypotension secondary to hypovolemia/chronic hypertension. Patient is treated chronically with lisinopril and verapamil. -Due to low-normal blood pressures on admission, lisinopril was held. He was started on IV fluids for hydration. His blood pressure has improved, but will continue to hold lisinopril as rate limiting medications are being titrated.  Type 2 diabetes mellitus. The patient is treated chronically with Degludec insulin 30 units every evening, metformin, Invokana, and Actos. His hemoglobin A1c during the previous hospitalization was 5.7. It was suggested that Actos could be discontinued at that time, but  apparently he was still taking it. -Due to his relatively normal CBGs in the setting of an illness,  Invokana, and Actos are being held.  Continue metformin and Lantus. Sliding scale NovoLog was started. His CBGs continue to be well controlled.  Generalized weakness. Patient has had generalized weakness, particularly in his legs and progressive debilitation. He has been morbidly obese for years and it may be taking a toll on his joints and his conditioning. His recurrent lower extremity cellulitis is certainly contributing. His TSH was within normal limits during the previous hospitalization. -Physical therapy evaluated the patient and recommended SNF. Agree.      DVT prophylaxis: Pradaxa Code Status: Full code Family Communication: Family not available Disposition Plan: Discharge when clinically appropriate, likely in a few days. ? SNF.   Consultants:   Wound care  Procedures:   None  Antimicrobials:   Cefazolin 9/12>>   Antifungal Diflucan 9/13/173 days.   Subjective: Patient complains of constipation, otherwise no other complaints..  Objective: Vitals:   12/25/15 1134 12/25/15 1240 12/25/15 2107 12/26/15 0516  BP: (!) 142/73 127/60 (!) 141/72 130/63  Pulse:  (!) 103 72 (!) 115  Resp: (!) 24 (!) 22 20 20   Temp: 97.2 F (36.2 C) 98.7 F (37.1 C) 99.1 F (37.3 C) 98.7 F (37.1 C)  TempSrc: Oral Oral Oral Oral  SpO2:  95% 94% 93%  Weight:      Height:      On telemetry in the room heart rate 89-120.  Intake/Output Summary (Last 24 hours) at 12/26/15 0925 Last data filed at 12/26/15 0742  Gross per 24 hour  Intake          1724.17 ml  Output             1450 ml  Net           274.17 ml   Filed Weights   12/22/15 0900 12/23/15 0500 12/25/15 0400  Weight: (!) 175.6 kg (387 lb 2 oz) (!) 163.3 kg (360 lb) (!) 167 kg (368 lb 2.7 oz)    Examination:  General exam: Appears calm and comfortable. He looks better.  Respiratory system: Clear to auscultation. Respiratory effort normal. Cardiovascular system: Irregular, irregular withBorderline tachycardia. Trace pedal edema. Gastrointestinal  system: Abdomen is obese, nondistended, soft and nontender. No organomegaly or masses felt. Normal bowel sounds heard. Central nervous system: Alert and oriented. No focal neurological deficits. Extremities/musculoskeletal: Bilateral lower extremity bandages are on, not taken off. Will examine at the next dressing change. Skin: Bilateral lower extremity bandages are on; not taken off. Will examine at the next dressing change. Psychiatry: Judgement and insight appear normal. Mood & affect appropriate.     Data Reviewed: I have personally reviewed following labs and imaging studies  CBC:  Recent Labs Lab 12/21/15 1534 12/22/15 0509 12/23/15 0531  WBC 18.6* 12.0* 9.6  NEUTROABS 16.0*  --   --   HGB 12.0* 10.2* 10.0*  HCT 37.8* 32.6* 32.7*  MCV 90.9 91.1 92.4  PLT 411* 299 256   Basic Metabolic Panel:  Recent Labs Lab 12/21/15 1534 12/22/15 0509 12/23/15 0531 12/25/15 0432  NA 137 136 137 138  K 4.9 4.1 3.8 3.9  CL 101 104 107 105  CO2 28 25 23 27   GLUCOSE 89 100* 73 100*  BUN 29* 25* 17 12  CREATININE 0.90 0.85 0.73 0.65  CALCIUM 8.7* 8.0* 7.9* 8.2*   GFR: Estimated Creatinine Clearance: 137.8 mL/min (by C-G formula based on SCr of 0.65  mg/dL). Liver Function Tests:  Recent Labs Lab 12/22/15 0509  AST 19  ALT 13*  ALKPHOS 77  BILITOT 0.7  PROT 6.1*  ALBUMIN 2.0*   No results for input(s): LIPASE, AMYLASE in the last 168 hours. No results for input(s): AMMONIA in the last 168 hours. Coagulation Profile: No results for input(s): INR, PROTIME in the last 168 hours. Cardiac Enzymes: No results for input(s): CKTOTAL, CKMB, CKMBINDEX, TROPONINI in the last 168 hours. BNP (last 3 results) No results for input(s): PROBNP in the last 8760 hours. HbA1C: No results for input(s): HGBA1C in the last 72 hours. CBG:  Recent Labs Lab 12/25/15 0719 12/25/15 1133 12/25/15 1811 12/25/15 2105 12/26/15 0741  GLUCAP 101* 107* 142* 138* 100*   Lipid Profile: No  results for input(s): CHOL, HDL, LDLCALC, TRIG, CHOLHDL, LDLDIRECT in the last 72 hours. Thyroid Function Tests: No results for input(s): TSH, T4TOTAL, FREET4, T3FREE, THYROIDAB in the last 72 hours. Anemia Panel: No results for input(s): VITAMINB12, FOLATE, FERRITIN, TIBC, IRON, RETICCTPCT in the last 72 hours. Sepsis Labs:  Recent Labs Lab 12/21/15 1552  LATICACIDVEN 1.31    Recent Results (from the past 240 hour(s))  Culture, blood (routine x 2)     Status: None (Preliminary result)   Collection Time: 12/21/15  3:35 PM  Result Value Ref Range Status   Specimen Description BLOOD RIGHT HAND  Final   Special Requests BOTTLES DRAWN AEROBIC AND ANAEROBIC 7CC EACH  Final   Culture NO GROWTH 3 DAYS  Final   Report Status PENDING  Incomplete  Culture, blood (routine x 2)     Status: None (Preliminary result)   Collection Time: 12/21/15  3:43 PM  Result Value Ref Range Status   Specimen Description BLOOD LEFT HAND  Final   Special Requests BOTTLES DRAWN AEROBIC ONLY 6CC  Final   Culture NO GROWTH 3 DAYS  Final   Report Status PENDING  Incomplete         Radiology Studies: No results found.      Scheduled Meds: .  ceFAZolin (ANCEF) IV  2 g Intravenous Q8H  . dabigatran  150 mg Oral Q12H  . digoxin  0.25 mg Oral Daily  . diltiazem  90 mg Oral Q8H  . insulin aspart  0-5 Units Subcutaneous QHS  . insulin aspart  0-9 Units Subcutaneous TID WC  . insulin glargine  20 Units Subcutaneous QHS  . metFORMIN  1,000 mg Oral QPM  . metoprolol tartrate  12.5 mg Oral TID  . nystatin ointment   Topical BID  . polyethylene glycol  17 g Oral Daily  . pravastatin  20 mg Oral q1800   Continuous Infusions: . sodium chloride 50 mL/hr at 12/25/15 0719     LOS: 5 days    Time spent: 30 minutes    Elliot CousinFISHER,Bryton Romagnoli, MD [facility] Pager [phone]  If 7PM-7AM, please contact night-coverage [email or web address] Password [email or web address] 12/26/2015, 9:25 AM

## 2015-12-26 NOTE — Progress Notes (Signed)
PROGRESS NOTE    Jonathan Wise  VHQ:[id number] DOB: 23-Jul-1945 DOA: 12/21/2015 PCP: Pamelia Hoit, MD    Brief Narrative:  Patient is a 70 yo man with history of chronic atrial fibrillation, diabetes mellitus, hypertension, morbid obesity, chronic venous stasis of his lower extremities, and recurrent lower extremity cellulitis, who presented on 12/21/2015 with generalized weakness, falls, bilateral leg pain with associated redness, and chills. He was just discharged 12/01/2015 for treatment of left lower extremity cellulitis/venous stasis ulcers and A. fib with RVR. In the ED, he was afebrile and tachycardic with a heart rate in the 120s. His initial blood pressure was in the 130s, but fell to the 90s systolically. His white blood cell count was elevated at 18.6. He was admitted for further evaluation and management.   Assessment & Plan:   Principal Problem:   Cellulitis of left leg Active Problems:   Chronic venous stasis dermatitis   Atrial fibrillation with rapid ventricular response (HCC)   Type 2 diabetes mellitus without complication (HCC)   Current use of long term anticoagulation   Hypotension   Morbid obesity (HCC)   Leukocytosis   Volume depletion   Cellulitis   Candidal intertrigo   Anemia of chronic disease    1. Recurrent left greater than right bilateral lower extremity cellulitis with venous stasis changes. The patient was treated approximately 3 weeks ago for the same. He was discharged on doxycycline which he completed. He is also treated with special dressings for chronic venous stasis changes. -He was started on cefazolin. This will be continued for now, but will consider adding vancomycin. - Diflucan and topical nystatin ointment were started for superimposed Candida intertrigo/cellulitis. -Wound care was consulted. Recommendations for dressing management noted and appreciated. The patient is treated with chronic Unna boots in the outpatient setting. I  wonder if they are contributing to his recurrent cellulitis. This will be discussed with the wound care nurse when available. -His blood pressure has improved. His white blood cell count has improved and normalized.  Candida intertrigo. -Management as above with topical nystatin and oral Diflucan 3 days. -There is less erythema of his thighs, pannus and perineum.  A. fib with RVR. Patient is treated chronically with verapamil and Pradaxa. Metoprolol was added to his discharge medications during the previous hospitalization, but he discontinued it due to symptomatic low blood pressures. His heart rate was in the 120s on admission. It continues to be in the low 100s to the 120s. He has no chest pain. -Due to low-normal blood pressures on admission, verapamil was changed to every 8 hour dosing. Digoxin was started at 0.125 mg and then titrated up to 0.25 mg. Verapamil was discontinued on 9/14 and Cardizem 90 mg every 8 hours was started. Metoprolol was started at 12.5 mg 3 times a day. -We'll change metoprolol to 25 mg twice a day. -His heart rate is trending down.   Hypotension secondary to hypovolemia/chronic hypertension. Patient is treated chronically with lisinopril and verapamil. -Due to low-normal blood pressures on admission, lisinopril was held. He was started on IV fluids for hydration. His blood pressure has improved, but will continue to hold lisinopril as rate limiting medications are being titrated.  Type 2 diabetes mellitus. The patient is treated chronically with Degludec insulin 30 units every evening, metformin, Invokana, and Actos. His hemoglobin A1c during the previous hospitalization was 5.7. It was suggested that Actos could be discontinued at that time, but apparently he was still taking it. -Due to his relatively normal  CBGs in the setting of an illness,  Invokana, and Actos are being held. Continue metformin and Lantus. Sliding scale NovoLog was started. His CBGs continue  to be well controlled.  Generalized weakness. Patient has had generalized weakness, particularly in his legs and progressive debilitation. He has been morbidly obese for years and it may be taking a toll on his joints and his conditioning. His recurrent lower extremity cellulitis is certainly contributing. His TSH was within normal limits during the previous hospitalization. -Physical therapy evaluated the patient and recommended SNF. Agree.      DVT prophylaxis: Pradaxa Code Status: Full code Family Communication: Family not available Disposition Plan: Discharge when clinically appropriate, likely to SNF in a couple days.   Consultants:   Wound care  Procedures:   None  Antimicrobials:   Cefazolin 9/12>>   Antifungal Diflucan 9/13/173 days.   Subjective: Patient had some left leg pain earlier, treated with analgesics.  Objective: Vitals:   12/25/15 1134 12/25/15 1240 12/25/15 2107 12/26/15 0516  BP: (!) 142/73 127/60 (!) 141/72 130/63  Pulse:  (!) 103 72 (!) 115  Resp: (!) 24 (!) 22 20 20   Temp: 97.2 F (36.2 C) 98.7 F (37.1 C) 99.1 F (37.3 C) 98.7 F (37.1 C)  TempSrc: Oral Oral Oral Oral  SpO2:  95% 94% 93%  Weight:      Height:      On telemetry in the room heart rate 75-110  Intake/Output Summary (Last 24 hours) at 12/26/15 0926 Last data filed at 12/26/15 0742  Gross per 24 hour  Intake          1724.17 ml  Output             1450 ml  Net           274.17 ml   Filed Weights   12/22/15 0900 12/23/15 0500 12/25/15 0400  Weight: (!) 175.6 kg (387 lb 2 oz) (!) 163.3 kg (360 lb) (!) 167 kg (368 lb 2.7 oz)    Examination:  General exam: Appears calm and comfortable. He looks better.  Respiratory system: Clear to auscultation. Respiratory effort normal. Cardiovascular system: Irregular, irregular withBorderline tachycardia. Trace pedal edema. Gastrointestinal system: Abdomen is obese, nondistended, soft and nontender. No organomegaly or masses  felt. Normal bowel sounds heard. Central nervous system: Alert and oriented. No focal neurological deficits. Extremities/musculoskeletal: Bilateral lower extremity bandages are on, not taken off. Will examine at the next dressing change. Skin: Bilateral lower extremity bandages are on; not taken off. Will examine at the next dressing change. Psychiatry: Judgement and insight appear normal. Mood & affect appropriate.     Data Reviewed: I have personally reviewed following labs and imaging studies  CBC:  Recent Labs Lab 12/21/15 1534 12/22/15 0509 12/23/15 0531  WBC 18.6* 12.0* 9.6  NEUTROABS 16.0*  --   --   HGB 12.0* 10.2* 10.0*  HCT 37.8* 32.6* 32.7*  MCV 90.9 91.1 92.4  PLT 411* 299 256   Basic Metabolic Panel:  Recent Labs Lab 12/21/15 1534 12/22/15 0509 12/23/15 0531 12/25/15 0432  NA 137 136 137 138  K 4.9 4.1 3.8 3.9  CL 101 104 107 105  CO2 28 25 23 27   GLUCOSE 89 100* 73 100*  BUN 29* 25* 17 12  CREATININE 0.90 0.85 0.73 0.65  CALCIUM 8.7* 8.0* 7.9* 8.2*   GFR: Estimated Creatinine Clearance: 137.8 mL/min (by C-G formula based on SCr of 0.65 mg/dL). Liver Function Tests:  Recent Labs Lab 12/22/15  0509  AST 19  ALT 13*  ALKPHOS 77  BILITOT 0.7  PROT 6.1*  ALBUMIN 2.0*   No results for input(s): LIPASE, AMYLASE in the last 168 hours. No results for input(s): AMMONIA in the last 168 hours. Coagulation Profile: No results for input(s): INR, PROTIME in the last 168 hours. Cardiac Enzymes: No results for input(s): CKTOTAL, CKMB, CKMBINDEX, TROPONINI in the last 168 hours. BNP (last 3 results) No results for input(s): PROBNP in the last 8760 hours. HbA1C: No results for input(s): HGBA1C in the last 72 hours. CBG:  Recent Labs Lab 12/25/15 0719 12/25/15 1133 12/25/15 1811 12/25/15 2105 12/26/15 0741  GLUCAP 101* 107* 142* 138* 100*   Lipid Profile: No results for input(s): CHOL, HDL, LDLCALC, TRIG, CHOLHDL, LDLDIRECT in the last 72  hours. Thyroid Function Tests: No results for input(s): TSH, T4TOTAL, FREET4, T3FREE, THYROIDAB in the last 72 hours. Anemia Panel: No results for input(s): VITAMINB12, FOLATE, FERRITIN, TIBC, IRON, RETICCTPCT in the last 72 hours. Sepsis Labs:  Recent Labs Lab 12/21/15 1552  LATICACIDVEN 1.31    Recent Results (from the past 240 hour(s))  Culture, blood (routine x 2)     Status: None (Preliminary result)   Collection Time: 12/21/15  3:35 PM  Result Value Ref Range Status   Specimen Description BLOOD RIGHT HAND  Final   Special Requests BOTTLES DRAWN AEROBIC AND ANAEROBIC 7CC EACH  Final   Culture NO GROWTH 3 DAYS  Final   Report Status PENDING  Incomplete  Culture, blood (routine x 2)     Status: None (Preliminary result)   Collection Time: 12/21/15  3:43 PM  Result Value Ref Range Status   Specimen Description BLOOD LEFT HAND  Final   Special Requests BOTTLES DRAWN AEROBIC ONLY 6CC  Final   Culture NO GROWTH 3 DAYS  Final   Report Status PENDING  Incomplete         Radiology Studies: No results found.      Scheduled Meds: .  ceFAZolin (ANCEF) IV  2 g Intravenous Q8H  . dabigatran  150 mg Oral Q12H  . digoxin  0.25 mg Oral Daily  . diltiazem  90 mg Oral Q8H  . insulin aspart  0-5 Units Subcutaneous QHS  . insulin aspart  0-9 Units Subcutaneous TID WC  . insulin glargine  20 Units Subcutaneous QHS  . metFORMIN  1,000 mg Oral QPM  . metoprolol tartrate  12.5 mg Oral TID  . nystatin ointment   Topical BID  . polyethylene glycol  17 g Oral Daily  . pravastatin  20 mg Oral q1800   Continuous Infusions: . sodium chloride 50 mL/hr at 12/25/15 0719     LOS: 5 days    Time spent: 25 minutes    [name], MD [facility] Pager [phone]  If 7PM-7AM, please contact night-coverage [email or web address] Password [email or web address] 12/26/2015, 9:26 AM

## 2015-12-27 ENCOUNTER — Inpatient Hospital Stay
Admission: RE | Admit: 2015-12-27 | Discharge: 2016-01-19 | Disposition: A | Discharge status: Home / Self Care | Payer: Medicare Other | Source: Ambulatory Visit | Attending: Internal Medicine | Admitting: Internal Medicine

## 2015-12-27 DIAGNOSIS — D638 Anemia in other chronic diseases classified elsewhere: Secondary | ICD-10-CM

## 2015-12-27 LAB — GLUCOSE, CAPILLARY
GLUCOSE-CAPILLARY: 124 mg/dL — AB (ref 65–99)
Glucose-Capillary: 163 mg/dL — ABNORMAL HIGH (ref 65–99)

## 2015-12-27 LAB — CULTURE, BLOOD (ROUTINE X 2)
CULTURE: NO GROWTH
Culture: NO GROWTH

## 2015-12-27 MED ORDER — POLYETHYLENE GLYCOL 3350 17 G PO PACK: 17.0000 g | PACK | Freq: Every day | ORAL | Status: DC | PRN

## 2015-12-27 MED ORDER — DILTIAZEM HCL ER COATED BEADS 180 MG PO CP24
360.0000 mg | ORAL_CAPSULE | Freq: Every day | ORAL | Status: DC
  Administered 2015-12-27: 360 mg via ORAL
  Filled 2015-12-27: qty 2

## 2015-12-27 MED ORDER — SILVER SULFADIAZINE 1 % EX CREA
TOPICAL_CREAM | CUTANEOUS | Status: DC
  Administered 2015-12-27: 13:00:00 via TOPICAL

## 2015-12-27 MED ORDER — SILVER SULFADIAZINE 1 % EX CREA
TOPICAL_CREAM | CUTANEOUS | Status: DC
  Filled 2015-12-27: qty 85

## 2015-12-27 MED ORDER — DIGOXIN 125 MCG PO TABS: 0.1250 mg | ORAL_TABLET | Freq: Every day | ORAL | Status: DC

## 2015-12-27 MED ORDER — SILVER SULFADIAZINE 1 % EX CREA: TOPICAL_CREAM | CUTANEOUS | Status: DC

## 2015-12-27 MED ORDER — CEPHALEXIN 500 MG PO CAPS: 500.0000 mg | ORAL_CAPSULE | Freq: Two times a day (BID) | ORAL | Status: DC

## 2015-12-27 MED ORDER — DILTIAZEM HCL ER COATED BEADS 360 MG PO CP24: 360.0000 mg | ORAL_CAPSULE | Freq: Every day | ORAL | Status: DC

## 2015-12-27 MED ORDER — LISINOPRIL 2.5 MG PO TABS: 5.0000 mg | ORAL_TABLET | Freq: Every day | ORAL | Status: DC

## 2015-12-27 MED ORDER — TRAMADOL HCL 50 MG PO TABS: 50.0000 mg | ORAL_TABLET | Freq: Four times a day (QID) | ORAL | 0 refills | Status: DC | PRN

## 2015-12-27 MED ORDER — NYSTATIN 100000 UNIT/GM EX OINT: TOPICAL_OINTMENT | Freq: Two times a day (BID) | CUTANEOUS | 0 refills | Status: DC

## 2015-12-27 MED ORDER — METOPROLOL TARTRATE 25 MG PO TABS: 25.0000 mg | ORAL_TABLET | Freq: Two times a day (BID) | ORAL | Status: DC

## 2015-12-27 NOTE — Progress Notes (Signed)
Report given to nursing staff at [facility]

## 2015-12-27 NOTE — Progress Notes (Signed)
[facility]erd Hibbso[facility] L [facility] discharged Skilled nursing facility per MD order.      Medication List    STOP taking these medications   aspirin EC 81 MG tablet   pioglitazone 45 MG tablet Commonly known as:  ACTOS   verapamil 120 MG tablet Commonly known as:  CALAN     TAKE these medications   cephALEXin 500 MG capsule Commonly known as:  KEFLEX Take 1 capsule (500 mg total) by mouth 2 (two) times daily. For 3 more days, starting 12/28/15.   dabigatran 150 MG Caps capsule Commonly known as:  PRADAXA Take 1 capsule (150 mg total) by mouth every 12 (twelve) hours.   digoxin 0.125 MG tablet Commonly known as:  LANOXIN Take 1 tablet (0.125 mg total) by mouth daily. Start taking on:  12/28/2015   diltiazem 360 MG 24 hr capsule Commonly known as:  CARDIZEM CD Take 1 capsule (360 mg total) by mouth daily. Start taking on:  12/28/2015   Insulin Degludec 200 UNIT/ML Sopn Inject 30 Units into the skin every evening. tresiba   INVOKANA 100 MG Tabs tablet Generic drug:  canagliflozin Take 100 mg by mouth daily.   lisinopril 2.5 MG tablet Commonly known as:  PRINIVIL,ZESTRIL Take 2 tablets (5 mg total) by mouth daily. What changed:  medication strength   metFORMIN 500 MG tablet Commonly known as:  GLUCOPHAGE Take 1,000 mg by mouth every evening.   metoprolol tartrate 25 MG tablet Commonly known as:  LOPRESSOR Take 1 tablet (25 mg total) by mouth 2 (two) times daily.   nystatin ointment Commonly known as:  MYCOSTATIN Apply topically 2 (two) times daily. Perineum, both thighs, abdominal pannus.   polyethylene glycol packet Commonly known as:  MIRALAX / GLYCOLAX Take 17 g by mouth daily as needed.   pravastatin 20 MG tablet Commonly known as:  PRAVACHOL Take 20 mg by mouth daily.   silver sulfADIAZINE 1 % cream Commonly known as:  SILVADENE Apply topically 3 (three) times a week. Applied to the left leg 3 times a day and to the right leg when necessary. Start taking on:   12/31/2015   traMADol 50 MG tablet Commonly known as:  ULTRAM Take 1 tablet (50 mg total) by mouth every 6 (six) hours as needed for moderate pain.        IV site discontinued and catheter remains intact. Site without signs and symptoms of complications. Dressing and pressure applied.   Rica KoyanagiBonnie M Dereona Kolodny 12/27/2015 4:36 PM

## 2015-12-27 NOTE — Consult Note (Signed)
[facility] re-consult: Dr Sherrie MustacheFisher called [facility]OC to discuss possible topical treatment options to attempt to prevent further cellulitis episodes.  Pt has been admitted several times this year for the same problem, and she would like to attempt a dressing which would provide compression and antimicrobial benefits, instead of Una boots which may be contributing to a moist environment.  Refer to previous [facility]OC consult note on 9/13; pt has been using Una boots prior to admission and family members have been applying bilat. [facility] and will have dressing change assistance. After treatment options were reviewed via a phone call with Dr Sherrie MustacheFisher, Silvadene/Kerlex/Ace wrap dressings were decided upon and can be changed every other day.  She plans to discuss the plan of care with the patient and family members. Please re-consult if further assistance is needed.  Thank-you,  Cammie Mcgeeawn T[place]umseh Yeagley MSN, RN, CWOCN, GaltWCN-AP, CNS [phone]

## 2015-12-27 NOTE — [place] FL2 (Signed)
[place] MEDICAID FL2 LEVEL OF CARE SCREENING TOOL     IDENTIFICATION  Patient Name: Jonathan Wise Birthdate: 1946/01/22 Sex: male Admission Date (Current Location): 12/21/2015  [facility] [place]   Facility and Address:  [facility],  [address], [place] [id number]      Provider Number: [id number]  Attending Physician Name and Address:  Elliot Cousinenise Fisher, MD  Relative Name and Phone Number:       Current Level of Care: Hospital Recommended Level of Care: Skilled Nursing Facility Prior Approval Number:    Date Approved/Denied:   PASRR Number: [id number]  Discharge Plan: SNF    Current Diagnoses: Patient Active Problem List   Diagnosis Date Noted  . Anemia of chronic disease 12/25/2015  . Candidal intertrigo 12/22/2015  . 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 Volume depletion 12/21/2015  . Cellulitis 12/21/2015  . Bradycardia 03/15/2014  . UTI (urinary tract infection) 03/15/2014  . Leukocytosis 03/15/2014  . Lactic acidosis 03/15/2014  . Atrial fibrillation (HCC) 06/23/2012  . PVC's (premature ventricular contractions) 06/23/2012  . Morbid obesity (HCC) 06/22/2012  . Osteoarthritis of right knee 06/22/2012  . Current use of long term anticoagulation 06/22/2012  . Pulmonary artery hypertension (HCC) 10/18/2011  . Metabolic acidosis 10/16/2011  . Urinary retention 10/15/2011  . Cellulitis of left leg 10/14/2011  . Bilateral lower extremity edema 10/14/2011  . Hypotension 10/14/2011  . Hypovolemia 10/14/2011  . Acute renal failure (HCC) 10/14/2011  . Venous stasis ulcers (HCC) 10/14/2011  . Hyponatremia 10/14/2011  . Atrial fibrillation with rapid ventricular response (HCC) 10/14/2011  . Nausea 10/14/2011  . Type 2 diabetes mellitus without complication (HCC) 10/14/2011    Orientation RESPIRATION BLADDER Height & Weight     Self, Time, Situation, Place  Normal Continent Weight: (!) 368 lb 2.7 oz (167  kg) Height:  6' 0.05" (183 cm)  BEHAVIORAL SYMPTOMS/MOOD NEUROLOGICAL BOWEL NUTRITION STATUS  Other (Comment) (none)  (n/a) Continent Diet (Carb modified)  AMBULATORY STATUS COMMUNICATION OF NEEDS Skin   Total Care Verbally Bruising, Other (Comment) (Bilateral leg cellulitis, cracking, weeping. Moisture associated skin damage to groin, abdoment. Intertriginous dermatitis to abdomen)                       Personal Care Assistance Level of Assistance  Bathing, Feeding, Dressing Bathing Assistance: Maximum assistance Feeding assistance: Limited assistance Dressing Assistance: Maximum assistance     Functional Limitations Info  Sight, Hearing, Speech Sight Info: Adequate Hearing Info: Adequate Speech Info: Adequate    SPECIAL CARE FACTORS FREQUENCY  PT (By licensed PT)     PT Frequency: 5              Contractures Contractures Info: Not present    Additional Factors Info  Insulin Sliding Scale Code Status Info: Full code Allergies Info: Furosemide           Current Medications (12/27/2015):  This is the current hospital active medication list Current Facility-Administered Medications  Medication Dose Route Frequency Provider Last Rate Last Dose  . 0.9 %  sodium chloride infusion   Intravenous Continuous Elliot Cousinenise Fisher, MD 50 mL/hr at 12/25/15 0719    . acetaminophen (TYLENOL) tablet 650 mg  650 mg Oral Q6H PRN Delano Metzobert Schertz, MD   650 mg at 12/26/15 2229   Or  . acetaminophen (TYLENOL) suppository 650 mg  650 mg Rectal Q6H PRN Delano Metzobert Schertz, MD      . bisacodyl (DULCOLAX) EC tablet  10 mg  10 mg Oral Daily PRN Delano Metz, MD      . ceFAZolin (ANCEF) IVPB 2g/100 mL premix  2 g Intravenous Q8H Delano Metz, MD   2 g at 12/27/15 0500  . dabigatran (PRADAXA) capsule 150 mg  150 mg Oral Q12H Delano Metz, MD   150 mg at 12/26/15 2229  . digoxin (LANOXIN) tablet 0.25 mg  0.25 mg Oral Daily [name], MD   0.25 mg at 12/26/15 1014  . diltiazem (CARDIZEM)  tablet 90 mg  90 mg Oral Q8H [name], MD   90 mg at 12/27/15 0530  . insulin aspart (novoLOG) injection 0-5 Units  0-5 Units Subcutaneous QHS [name], MD      . insulin aspart (novoLOG) injection 0-9 Units  0-9 Units Subcutaneous TID WC [name], MD   1 Units at 12/24/15 1720  . insulin glargine (LANTUS) injection 20 Units  20 Units Subcutaneous QHS Delano Metz, MD   20 Units at 12/26/15 2228  . metFORMIN (GLUCOPHAGE) tablet 1,000 mg  1,000 mg Oral QPM Delano Metz, MD   1,000 mg at 12/26/15 1713  . metoprolol tartrate (LOPRESSOR) tablet 25 mg  25 mg Oral BID [name], MD   25 mg at 12/26/15 2229  . nystatin ointment (MYCOSTATIN)   Topical BID [name], MD      . ondansetron [facility]) tablet 4 mg  4 mg Oral Q6H PRN Delano Metz, MD       Or  . ondansetron [facility]) injection 4 mg  4 mg Intravenous Q6H PRN Delano Metz, MD      . polyethylene glycol (MIRALAX / GLYCOLAX) packet 17 g  17 g Oral Daily PRN Delano Metz, MD      . polyethylene glycol (MIRALAX / GLYCOLAX) packet 17 g  17 g Oral Daily [name], MD   17 g at 12/25/15 0935  . pravastatin (PRAVACHOL) tablet 20 mg  20 mg Oral q1800 [name], MD   20 mg at 12/26/15 1713  . traMADol (ULTRAM) tablet 50 mg  50 mg Oral Q6H PRN Pearson Grippe, MD   50 mg at 12/25/15 2124     Discharge Medications: Please see discharge summary for a list of discharge medications.  Relevant Imaging Results:  Relevant Lab Results:   Additional Information SSN: [id number]  Karn [place], [place] [phone]

## 2015-12-27 NOTE — Clinical Social Work Placement (Signed)
   CLINICAL SOCIAL WORK PLACEMENT  NOTE  Date:  12/27/2015  Patient Details  Name: Jonathan Wise MRN: [id number] Date of Birth: 1946-02-14  Clinical Social Work is seeking post-discharge placement for this patient at the Skilled  Nursing Facility level of care (*CSW will initial, date and re-position this form in  chart as items are completed):  Yes   Patient/family provided with [facility] Clinical Social Work Department's list of facilities offering this level of care within the geographic area requested by the patient (or if unable, by the patient's family).  Yes   Patient/family informed of their freedom to choose among providers that offer the needed level of care, that participate in Medicare, Medicaid or managed care program needed by the patient, have an available bed and are willing to accept the patient.  Yes   Patient/family informed of [facility]'s ownership interest in [facility] and [facility], as well as of the fact that they are under no obligation to receive care at these facilities.  PASRR submitted to EDS on 12/27/15     PASRR number received on 12/27/15     Existing PASRR number confirmed on       FL2 transmitted to all facilities in geographic area requested by pt/family on 12/27/15     FL2 transmitted to all facilities within larger geographic area on       Patient informed that his/her managed care company has contracts with or will negotiate with certain facilities, including the following:        Yes   Patient/family informed of bed offers received.  [facility]     Physician recommends and patient chooses bed at      [facility] 12/27/15.  Patient to be transferred to facility by staff     Patient family notified on 12/27/15 of transfer.  Name of family member notified:  [place]- wife     PHYSICIAN       Additional Comment:     _______________________________________________ Karn CassisStultz, Jodene Polyak Shanaberger, LCSW 12/27/2015, 12:15 PM [phone]

## 2015-12-27 NOTE — Care Management Note (Signed)
Case Management Note  Patient Details  Name: Ferd HibbsRobert L Stoneham MRN: [id number] Date of Birth: May 01, 1945   Expected Discharge Date:     12/27/2015             Expected Discharge Plan:  Skilled Nursing Facility  In-House Referral:  Clinical Social Work  Discharge planning Services  CM Consult  Post Acute Care Choice:  NA Choice offered to:  NA  DME Arranged:    DME Agency:     HH Arranged:    HH Agency:     Status of Service:  Completed, signed off  If discussed at [employer] Meetings, dates discussed:    Additional Comments: Pt discharged to SNF today. CSW has made arrangements for placement. Tim Justis of [facility] is aware of DC to SNF.   Malcolm Metrohildress, Recardo Linn Demske, RN 12/27/2015, 1:52 PM

## 2015-12-27 NOTE — Clinical Social Work Placement (Signed)
   CLINICAL SOCIAL WORK PLACEMENT  NOTE  Date:  12/27/2015  Patient Details  Name: Ferd HibbsRobert L Pleitez MRN: [id number] Date of Birth: [birth year]  Clinical Social Work is seeking post-discharge placement for this patient at the Skilled  Nursing Facility level of care (*CSW will initial, date and re-position this form in  chart as items are completed):  Yes   Patient/family provided with [facility] Clinical Social Work Department's list of facilities offering this level of care within the geographic area requested by the patient (or if unable, by the patient's family).  Yes   Patient/family informed of their freedom to choose among providers that offer the needed level of care, that participate in Medicare, Medicaid or managed care program needed by the patient, have an available bed and are willing to accept the patient.  Yes   Patient/family informed of [place]'s ownership interest in [facility] and [facility], as well as of the fact that they are under no obligation to receive care at these facilities.  PASRR submitted to EDS on 12/27/15     PASRR number received on 12/27/15     Existing PASRR number confirmed on       FL2 transmitted to all facilities in geographic area requested by pt/family on 12/27/15     FL2 transmitted to all facilities within larger geographic area on       Patient informed that his/her managed care company has contracts with or will negotiate with certain facilities, including the following:            Patient/family informed of bed offers received.  Patient chooses bed at       Physician recommends and patient chooses bed at      Patient to be transferred to   on  .  Patient to be transferred to facility by       Patient family notified on   of transfer.  Name of family member notified:        PHYSICIAN       Additional Comment:    _______________________________________________ Karn CassisStultz, Giordano Getman Shanaberger, LCSW 12/27/2015, 8:51  AM [phone]

## 2015-12-27 NOTE — Care Management Important Message (Signed)
Important Message  Patient Details  Name: Ferd HibbsRobert L Swicegood MRN: [id number] Date of Birth: Aug 09, 1945   Medicare Important Message Given:  Yes    [name], Merric Yost Demske, RN 12/27/2015, 1:50 PM

## 2015-12-27 NOTE — Clinical Social Work Note (Signed)
Clinical Social Work Assessment  Patient Details  Name: Jonathan Wise MRN: [id number] Date of Birth: [birth year]  Date of referral:  12/27/15               Reason for consult:  Discharge Planning                Permission sought to share information with:    Permission granted to share information::     Name::        Agency::     Relationship::     Contact Information:     Housing/Transportation Living arrangements for the past 2 months:  Single Family Home Source of Information:  Patient Patient Interpreter Needed:  None Criminal Activity/Legal Involvement Pertinent to Current Situation/Hospitalization:  No - Comment as needed Significant Relationships:  Spouse Lives with:  Spouse Do you feel safe going back to the place where you live?  Yes Need for family participation in patient care:  Yes (Comment)  Care giving concerns:  Pt is concerned about safety returning home due to falls.    Social Worker assessment / plan:  CSW met with pt at bedside. Pt alert and oriented and reports he lives with his wife. His adult granddaughter has also been staying with them, but she works a lot. Pt reports he was in hospital in August and ever since then has been very deconditioned and had 6-8 falls. He and his wife were considering placement from home prior to this admission. At baseline, pt ambulates with a rollator. He is aware of PT recommendation for SNF and agrees. Requests [facility] or [facility]. CSW will follow up with bed offers.   Employment status:  Retired Nurse, adult PT Recommendations:  [place] / Referral to community resources:  [place]  Patient/Family's Response to care:  Pt is very open to short term SNF at d/c.   Patient/Family's Understanding of and Emotional Response to Diagnosis, Current Treatment, and Prognosis:  Pt is aware of admission diagnosis and treatment plan. He indicates he feels he  will be d/c today.   Emotional Assessment Appearance:  Appears stated age Attitude/Demeanor/Rapport:  Other (Cooperative) Affect (typically observed):  Accepting Orientation:  Oriented to Self, Oriented to Place, Oriented to  Time, Oriented to Situation Alcohol / Substance use:  Not Applicable Psych involvement (Current and /or in the community):  No (Comment)  Discharge Needs  Concerns to be addressed:  Discharge Planning Concerns Readmission within the last 30 days:  Yes Current discharge risk:  Physical Impairment Barriers to Discharge:  No Barriers Identified   Salome Arnt, Elliott 12/27/2015, 8:53 AM [phone]

## 2015-12-27 NOTE — Discharge Summary (Addendum)
Physician Discharge Summary  Jonathan Wise:[id number] DOB: 09-06-1945 DOA: 12/21/2015  PCP: Pamelia Hoit, MD  Admit date: 12/21/2015 Discharge date: 12/27/2015  Time spent: Greater than 30 minutes  Recommendations for Outpatient Follow-up:  1.  Wound care to the left leg should be Silvadene application followed by Kerlix and Ace wrap. Dressing should be changed 3 times weekly. The right leg can be dressed in the same manner when necessary. 2. Recommend Checking the patient's CBGs twice a day. 3. Recommend monitoring of the patient's heart rate on his new medication regimen for A. Fib. 4. Patient is being discharged to the [facility] for rehabilitation.   Discharge Diagnoses:  1. 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 2. Candidal intertrigo. 3. Chronic bilateral lower extremity stasis dermatitis. 4. Chronic atrial fibrillation with RVR; on Pradaxa. 5. Type 2 diabetes mellitus. 6. Volume depletion. 7. Anemia of chronic disease. 8. Morbid obesity. 9. Deconditioning.  Discharge Condition: Improved  Diet recommendation: Heart healthy/carbohydrate modified  Filed Weights   12/22/15 0900 12/23/15 0500 12/25/15 0400  Weight: (!) 175.6 kg (387 lb 2 oz) (!) 163.3 kg (360 lb) (!) 167 kg (368 lb 2.7 oz)    History of present illness:  Patient is a 70 yo man with history of chronic atrial fibrillation, diabetes mellitus, hypertension, morbid obesity, chronic venous stasis of his lower extremities, and recurrent lower extremity cellulitis, who presented on 12/21/2015 with generalized weakness, falls, bilateral leg pain with associated redness, and chills. He was just discharged 12/01/2015 for treatment of left lower extremity cellulitis/venous stasis ulcers and A. fib with RVR. In the ED, he was afebrile and tachycardic with a heart rate in the 120s. His initial blood pressure was in the 130s, but fell to the 90s systolically. His white blood cell count was elevated at 18.6.  He was admitted for further evaluation and management.  Hospital Course:   1. Recurrent left greater than right bilateral lower extremity cellulitis with venous stasis changes. The patient was treated approximately 3 weeks ago for the same. He was discharged on doxycycline which he completed. He was also managed with chronic Unna boots for chronic venous stasis changes. -He was started on cefazolin. - Diflucan and topical nystatin ointment were started for superimposed Candida intertrigo/cellulitis. -Wound care was consulted and recommendations were made for dressing changes. -I discussed the potential etiology of his recurrent cellulitis with the patient, his wife, and wound care RN, Dawn. I wondered if the Unna boots were contributing recurrent bacterial overgrowth leading to to the recurrent cellulitis. Per my conversation with Alvis Lemmings, it was decided that the Unna boots would be discontinued in favor of Silvadene applied to the legs, followed by Kerlix, followed by an Ace wrap. This applies primarily to the left lower extremity which always seems to be worse. The Silvadene/Kerlix/Ace bandage should be changed 3 times weekly. -Due to near resolution of the right leg cellulitis and no obvious weeping ulcers, Silvadene and dressings can be applied as needed. -He was discharged on Keflex for 3 more days.  Candida intertrigo. -Patient had evidence of perineum and abdominal pannus candidal intertrigo. He was treated with 3 days of oral Diflucan and twice a day dosing of topical nystatin. Would recommend topical nystatin twice a day indefinitely.  A. fib with RVR. Patient was treated chronically with verapamil and Pradaxa. Metoprolol was added to his discharge medications during the previous hospitalization, but he discontinued it due to symptomatic low blood pressures. -Verapamil was restarted at 3 times a day  dosing, but his heart rate remained persistently elevated. -When his blood pressure  improved, verapamil was discontinued in favor of diltiazem. Metoprolol was restarted and titrated. Digoxin was started. -With this regimen, his heart rate became controlled. There was no evidence of symptomatic hypotension with the addition of metoprolol. -Recommend close monitoring of his heart rates and adjustments in his medications as needed. The patient remained on Pradaxa.  Hypotension secondary to hypovolemia/chronic hypertension. Patient is treated chronically with lisinopril and verapamil. -Due to low-normal blood pressures on admission, lisinopril was held. He was started on IV fluids for hydration. His blood pressure. The dose of lisinopril was decreased to 2.5 mg daily at the time of discharge.  Type 2 diabetes mellitus. The patient is treated chronically with Degludec insulin 30 units every evening, metformin, Invokana, and Actos. His hemoglobin A1c during the previous hospitalization was 5.7. -Due to his relatively normal CBGs in the setting of an illness,  Invokana, and Actos were held. Metformin and Lantus were continued. Sliding scale NovoLog was added. -At the time of discharge, all of his home medications were continued except Actos-which was discontinued indefinitely. His blood glucose remained relatively controlled.   Generalized weakness. Patient has had generalized weakness, particularly in his legs and progressive debilitation. He has been morbidly obese for years and it may be taking a toll on his joints and his conditioning. His recurrent lower extremity cellulitis was certainly contributing. His TSH was within normal limits during the previous hospitalization. -Physical therapy evaluated the patient and recommended SNF. Agree.   Procedures:  None  Consultations:  Wound care nurse.  Discharge Exam: Vitals:   12/26/15 2100 12/27/15 0500  BP: (!) 152/83 (!) 159/83  Pulse: (!) 121 (!) 118  Resp: 20 18  Temp: 98.3 F (36.8 C) 98.5 F (36.9 C)   General  exam: Appears calm and comfortable. He looks better.  Respiratory system: Clear to auscultation. Respiratory effort normal. Cardiovascular system: Irregular, irregular. Trace pedal edema; trace to 1+ LLE edema. Gastrointestinal system: Abdomen is obese, nondistended, soft and nontender. No organomegaly or masses felt. Normal bowel sounds heard. Central nervous system: Alert and oriented. No focal neurological deficits. Extremities/musculoskeletal: No acute hot red joints.. Skin: Significantly less erythema of the upper bilateral thighs, abdominal pannus, and perineum. Near resolution of erythema of the right lower extremity; chronic hyperpigmented areas; no open weeping lesions. The left leg with ulcerations on the left calf with small amount of serous drainage, significantly less erythema over the pretibial left leg; chronic hyperpigmented areas; trace to 1+ left leg edema. Psychiatry: Judgement and insight appear normal. Mood & affect appropriate.    Discharge Instructions   Discharge Instructions    Diet - low sodium heart healthy    Complete by:  As directed    Discharge wound care:    Complete by:  As directed    Silvadene/Kerlex/Ace wrap dressings were decided upon and can be changed every other day.   Increase activity slowly    Complete by:  As directed      Current Discharge Medication List    START taking these medications   Details  cephALEXin (KEFLEX) 500 MG capsule Take 1 capsule (500 mg total) by mouth 2 (two) times daily. For 3 more days, starting 12/28/15.    digoxin (LANOXIN) 0.125 MG tablet Take 1 tablet (0.125 mg total) by mouth daily.    diltiazem (CARDIZEM CD) 360 MG 24 hr capsule Take 1 capsule (360 mg total) by mouth daily.    metoprolol  tartrate (LOPRESSOR) 25 MG tablet Take 1 tablet (25 mg total) by mouth 2 (two) times daily.    nystatin ointment (MYCOSTATIN) Apply topically 2 (two) times daily. Perineum, both thighs, abdominal pannus. Qty: 30 g, Refills: 0     polyethylene glycol (MIRALAX / GLYCOLAX) packet Take 17 g by mouth daily as needed.    silver sulfADIAZINE (SILVADENE) 1 % cream Apply topically 3 (three) times a week. Applied to the left leg 3 times a day and to the right leg when necessary.    traMADol (ULTRAM) 50 MG tablet Take 1 tablet (50 mg total) by mouth every 6 (six) hours as needed for moderate pain. Qty: 30 tablet, Refills: 0      CONTINUE these medications which have CHANGED   Details  lisinopril (PRINIVIL,ZESTRIL) 2.5 MG tablet Take 2 tablets (5 mg total) by mouth daily.      CONTINUE these medications which have NOT CHANGED   Details  canagliflozin (INVOKANA) 100 MG TABS tablet Take 100 mg by mouth daily.    dabigatran (PRADAXA) 150 MG CAPS capsule Take 1 capsule (150 mg total) by mouth every 12 (twelve) hours. Qty: 60 capsule, Refills: 2    Insulin Degludec 200 UNIT/ML SOPN Inject 30 Units into the skin every evening. tresiba    metFORMIN (GLUCOPHAGE) 500 MG tablet Take 1,000 mg by mouth every evening.     pravastatin (PRAVACHOL) 20 MG tablet Take 20 mg by mouth daily.      STOP taking these medications     aspirin EC 81 MG tablet      pioglitazone (ACTOS) 45 MG tablet      verapamil (CALAN) 120 MG tablet        Allergies  Allergen Reactions  . Furosemide Other (See Comments)    Dizziness , drowsiness      The results of significant diagnostics from this hospitalization (including imaging, microbiology, ancillary and laboratory) are listed below for reference.    Significant Diagnostic Studies: Dg Chest Port 1 View  Result Date: 11/27/2015 CLINICAL DATA:  70 year old male with weakness and presyncope. Possible hypoglycemia. Shortness of breath and fever. Initial encounter. EXAM: PORTABLE CHEST 1 VIEW COMPARISON:  03/15/2014 portable chest and earlier. FINDINGS: Portable AP supine view at 1854 hours. Large body habitus. Stable cardiomegaly and mediastinal contours. Pulmonary vascularity appears  stable. No overt edema. Otherwise when Allowing for portable technique the lungs are clear. Visualized tracheal air column is within normal limits. No pneumothorax or pleural effusion. IMPRESSION: No acute cardiopulmonary abnormality. Electronically Signed   By: Odessa Fleming M.D.   On: 11/27/2015 19:09    Microbiology: Recent Results (from the past 240 hour(s))  Culture, blood (routine x 2)     Status: None   Collection Time: 12/21/15  3:35 PM  Result Value Ref Range Status   Specimen Description BLOOD RIGHT HAND  Final   Special Requests BOTTLES DRAWN AEROBIC AND ANAEROBIC [facility] EACH  Final   Culture NO GROWTH 6 DAYS  Final   Report Status 12/27/2015 FINAL  Final  Culture, blood (routine x 2)     Status: None   Collection Time: 12/21/15  3:43 PM  Result Value Ref Range Status   Specimen Description BLOOD LEFT HAND  Final   Special Requests BOTTLES DRAWN AEROBIC ONLY [facility]  Final   Culture NO GROWTH 6 DAYS  Final   Report Status 12/27/2015 FINAL  Final     Labs: Basic Metabolic Panel:  Recent Labs Lab 12/21/15 1534 12/22/15 0509  12/23/15 0531 12/25/15 0432  NA 137 136 137 138  K 4.9 4.1 3.8 3.9  CL 101 104 107 105  CO2 28 25 23 27   GLUCOSE 89 100* 73 100*  BUN 29* 25* 17 12  CREATININE 0.90 0.85 0.73 0.65  CALCIUM 8.7* 8.0* 7.9* 8.2*   Liver Function Tests:  Recent Labs Lab 12/22/15 0509  AST 19  ALT 13*  ALKPHOS 77  BILITOT 0.7  PROT 6.1*  ALBUMIN 2.0*   No results for input(s): LIPASE, AMYLASE in the last 168 hours. No results for input(s): AMMONIA in the last 168 hours. CBC:  Recent Labs Lab 12/21/15 1534 12/22/15 0509 12/23/15 0531  WBC 18.6* 12.0* 9.6  NEUTROABS 16.0*  --   --   HGB 12.0* 10.2* 10.0*  HCT 37.8* 32.6* 32.7*  MCV 90.9 91.1 92.4  PLT 411* 299 256   Cardiac Enzymes: No results for input(s): CKTOTAL, CKMB, CKMBINDEX, TROPONINI in the last 168 hours. BNP: BNP (last 3 results) No results for input(s): BNP in the last 8760 hours.  ProBNP  (last 3 results) No results for input(s): PROBNP in the last 8760 hours.  CBG:  Recent Labs Lab 12/26/15 1115 12/26/15 1557 12/26/15 2057 12/27/15 0821 12/27/15 1145  GLUCAP 115* 99 200* 124* 163*       Signed:  Andrez Lieurance MD.  [facility] 12/27/2015, 1:10 PM

## 2015-12-28 ENCOUNTER — Non-Acute Institutional Stay (SKILLED_NURSING_FACILITY): Payer: Medicare Other | Admitting: Internal Medicine

## 2015-12-28 ENCOUNTER — Encounter: Payer: Self-pay | Admitting: Internal Medicine

## 2015-12-28 DIAGNOSIS — L03116 Cellulitis of left lower limb: Secondary | ICD-10-CM

## 2015-12-28 DIAGNOSIS — E119 Type 2 diabetes mellitus without complications: Secondary | ICD-10-CM

## 2015-12-28 DIAGNOSIS — I83029 Varicose veins of left lower extremity with ulcer of unspecified site: Secondary | ICD-10-CM

## 2015-12-28 DIAGNOSIS — D638 Anemia in other chronic diseases classified elsewhere: Secondary | ICD-10-CM

## 2015-12-28 DIAGNOSIS — I482 Chronic atrial fibrillation, unspecified: Secondary | ICD-10-CM

## 2015-12-28 DIAGNOSIS — B372 Candidiasis of skin and nail: Secondary | ICD-10-CM

## 2015-12-28 DIAGNOSIS — I8310 Varicose veins of unspecified lower extremity with inflammation: Secondary | ICD-10-CM

## 2015-12-28 DIAGNOSIS — IMO0001 Reserved for inherently not codable concepts without codable children: Secondary | ICD-10-CM

## 2015-12-28 NOTE — Progress Notes (Signed)
Provider:  [name] Location:   [facility] Room Number: 147/W Place of Service:  SNF (31)  PCP: Jonathan Hoit, MD Patient Care Team: Jonathan Banner, MD as PCP - General (Family Medicine) Jonathan Linden, MD as Attending Physician (Cardiology)  Extended Emergency Contact Information Primary Emergency Contact: Jonathan Wise Address: [address]          [place], [place] [address] [place] Home Phone: [phone] Relation: Spouse Secondary Emergency Contact: Jonathan Wise Address: [address]          [place], [place] [address] [place] Home Phone: [phone] Relation: Grandaughter  Code Status: DNR Goals of Care: Advanced Directive information Advanced Directives 12/28/2015  Does patient have an advance directive? Yes  Type of Advance Directive Out of facility DNR (pink MOST or yellow form)  Does patient want to make changes to advanced directive? No - Patient declined  Copy of advanced directive(s) in chart? Yes  Would patient like information on creating an advanced directive? -  Pre-existing out of facility DNR order (yellow form or pink MOST form) -      Chief Complaint  Patient presents with  . New Admit To SNF    HPI: Patient is a 70 y.o. male seen today for admission to SNF  for rehab after Hospitalization for Left leg Cellulitis. Patient is morbidly obese with Chronic Venous stasis. He has had first admission in 11/17/15 for Cellulitis and fever. He ws discharged home but was readmitted on 09/12 With recurrence of infection. He is being discharged on Kerlix dressing with Silvadene application and Keflex for 3 more days. In the hospital patient also had so,me trouble with Hypotension and rate control for his Atrial fibrillation.  Patient was resting in his bed today. He had no new complains. He wants to eventually gain strength in his Lower legs and go home with his Wife.  Past Medical History:    Diagnosis Date  . Atrial fibrillation (HCC)    On Pradaxa  . Bilateral lower extremity edema   . DVT (deep venous thrombosis) (HCC) 1994   LLE. Completed tx with coumadin  . Hyperlipidemia   . Hypertension   . Kidney stones   . Morbid obesity (HCC)   . Pulmonary artery hypertension (HCC) 10/18/2011  . Type 2 diabetes mellitus (HCC)   . Urinary retention 10/15/2011  . Venous stasis ulcers (HCC)    Past Surgical History:  Procedure Laterality Date  . CYSTOSCOPY  10/18/2011   Procedure: CYSTOSCOPY FLEXIBLE;  Surgeon: Jonathan Barban, MD;  Location: [facility];  Service: Urology;  Laterality: N/A;  . HERNIA REPAIR     Umbilical    reports that he has never smoked. He has never used smokeless tobacco. He reports that he does not drink alcohol or use drugs. Social History   Social History  . Marital status: Married    Spouse name: N/A  . Number of children: N/A  . Years of education: N/A   Occupational History  . Not on file.   Social History Main Topics  . Smoking status: Never Smoker  . Smokeless tobacco: Never Used  . Alcohol use No  . Drug use: No  . Sexual activity: Not on file   Other Topics Concern  . Not on file   Social History Narrative  . No narrative on file    Functional Status Survey:    Family History  Problem Relation Age of Onset  . Diabetes  Mother   . Emphysema Father     Health Maintenance  Topic Date Due  . Hepatitis C Screening  12-29-1945  . FOOT EXAM  03/27/1955  . OPHTHALMOLOGY EXAM  03/27/1955  . URINE MICROALBUMIN  03/27/1955  . TETANUS/TDAP  03/26/1964  . COLONOSCOPY  03/27/1995  . ZOSTAVAX  03/26/2005  . PNA vac Low Risk Adult (2 of 2 - PCV13) 03/17/2015  . INFLUENZA VACCINE  03/10/2016 (Originally 11/09/2015)  . HEMOGLOBIN A1C  05/31/2016    Allergies  Allergen Reactions  . Furosemide Other (See Comments)    Dizziness , drowsiness    Current Outpatient Prescriptions on File Prior to Visit  Medication Sig Dispense Refill   . canagliflozin (INVOKANA) 100 MG TABS tablet Take 100 mg by mouth daily.    . cephALEXin (KEFLEX) 500 MG capsule Take 1 capsule (500 mg total) by mouth 2 (two) times daily. For 3 more days, starting 12/28/15.    . dabigatran (PRADAXA) 150 MG CAPS capsule Take 1 capsule (150 mg total) by mouth every 12 (twelve) hours. 60 capsule 2  . digoxin (LANOXIN) 0.125 MG tablet Take 1 tablet (0.125 mg total) by mouth daily.    Marland Kitchen diltiazem (CARDIZEM CD) 360 MG 24 hr capsule Take 1 capsule (360 mg total) by mouth daily.    Marland Kitchen lisinopril (PRINIVIL,ZESTRIL) 2.5 MG tablet Take 2 tablets (5 mg total) by mouth daily.    . metFORMIN (GLUCOPHAGE) 500 MG tablet Take 1,000 mg by mouth every evening.     . metoprolol tartrate (LOPRESSOR) 25 MG tablet Take 1 tablet (25 mg total) by mouth 2 (two) times daily.    Marland Kitchen nystatin ointment (MYCOSTATIN) Apply topically 2 (two) times daily. Perineum, both thighs, abdominal pannus. 30 g 0  . polyethylene glycol (MIRALAX / GLYCOLAX) packet Take 17 g by mouth daily as needed.    . pravastatin (PRAVACHOL) 20 MG tablet Take 20 mg by mouth daily.    Melene Muller ON 12/31/2015] silver sulfADIAZINE (SILVADENE) 1 % cream Apply topically 3 (three) times a week. Applied to the left leg 3 times a day and to the right leg when necessary.    . traMADol (ULTRAM) 50 MG tablet Take 1 tablet (50 mg total) by mouth every 6 (six) hours as needed for moderate pain. 30 tablet 0   No current facility-administered medications on file prior to visit.     Review of Systems  Constitutional: Negative.   HENT: Negative.   Respiratory: Negative.   Cardiovascular: Negative.   Gastrointestinal: Negative.     Vitals:   12/28/15 1148  BP: (!) 158/74  Pulse: 86  Resp: 20  Temp: 98.7 F (37.1 C)  TempSrc: Oral   There is no height or weight on file to calculate BMI. Physical Exam  Constitutional: He appears well-developed.  Morbidly Obese.  HENT:  Head: Normocephalic.  Mouth/Throat: Oropharynx is  clear and moist.  Eyes: Pupils are equal, round, and reactive to light.  Cardiovascular: Normal rate and regular rhythm.   Pulmonary/Chest: Breath sounds normal. No respiratory distress. He has no wheezes. He has no rales.  Abdominal: Soft. Bowel sounds are normal. He exhibits no distension. There is no tenderness. There is no rebound and no guarding.  Musculoskeletal:  Has redness and dryness through out his lower legs with Moderate Edema. His left leg is in Kerlix wrap from knee down. Chronic Venous changes in Both lower legs.  Skin:  Patient has redness and dry skin throughout his Upper and Lower extremity.  Possible rash in the Groin folds.    Labs reviewed: Basic Metabolic Panel:  Recent Labs  16/01/9608/13/17 0509 12/23/15 0531 12/25/15 0432  NA 136 137 138  K 4.1 3.8 3.9  CL 104 107 105  CO2 25 23 27   GLUCOSE 100* 73 100*  BUN 25* 17 12  CREATININE 0.85 0.73 0.65  CALCIUM 8.0* 7.9* 8.2*   Liver Function Tests:  Recent Labs  11/27/15 1840 11/28/15 0434 12/22/15 0509  AST 20 21 19   ALT 15* 15* 13*  ALKPHOS 64 61 77  BILITOT 1.9* 1.6* 0.7  PROT 6.9 6.6 6.1*  ALBUMIN 3.3* 3.1* 2.0*   No results for input(s): LIPASE, AMYLASE in the last 8760 hours. No results for input(s): AMMONIA in the last 8760 hours. CBC:  Recent Labs  11/27/15 1840  12/21/15 1534 12/22/15 0509 12/23/15 0531  WBC 19.6*  < > 18.6* 12.0* 9.6  NEUTROABS 18.1*  --  16.0*  --   --   HGB 12.9*  < > 12.0* 10.2* 10.0*  HCT 40.7  < > 37.8* 32.6* 32.7*  MCV 92.7  < > 90.9 91.1 92.4  PLT 212  < > 411* 299 256  < > = values in this interval not displayed. Cardiac Enzymes: No results for input(s): CKTOTAL, CKMB, CKMBINDEX, TROPONINI in the last 8760 hours. BNP: Invalid input(s): POCBNP Lab Results  Component Value Date   HGBA1C 5.7 (H) 11/29/2015   Lab Results  Component Value Date   TSH 0.787 11/28/2015       Imaging and Procedures obtained prior to SNF admission: No results  found.  Assessment/Plan  Cellulitis of left leg  Will continue Kerlix wrap with Silvadene. Also on Keflex for few more days. His Unna boots are off for now. Will start Pt. Patient wants to eventually go home with his wife.  Chronic atrial fibrillation  Rate Controlled right now. He was switched from Verapamil to Cardizem and Metoprolol due to Hypotension. His BP is doing well right now. Continue to monitor. Continue Pradaxa.   Type 2 diabetes mellitus   Patient BS were stable in Hospital. Actos was discontinued. He is still on Treshiba , Metformin, And Invokana. Accucheck BID. Continue to monitor. Last HGBA1c was 5.7  Morbid obesity with weakness Continue Physical therapy.  Anemia of chronic disease I do not see any w/u for his anemia. It can be due to chronic disease. His HGB has slowly drifted down over past few month.Will follow and get iron studies.  Candidal intertrigo On Nystatin cream. Did get diflucan in hospital if not better we have to consider giving him anooher course.     Family/ staff Communication:   Labs/tests ordered:

## 2016-01-04 ENCOUNTER — Encounter (HOSPITAL_COMMUNITY)
Admission: RE | Admit: 2016-01-04 | Discharge: 2016-01-04 | Disposition: A | Discharge status: Home / Self Care | Payer: Medicare Other | Source: Skilled Nursing Facility | Attending: Internal Medicine | Admitting: Internal Medicine

## 2016-01-04 LAB — BASIC METABOLIC PANEL
ANION GAP: 6 (ref 5–15)
BUN: 17 mg/dL (ref 6–20)
CHLORIDE: 101 mmol/L (ref 101–111)
CO2: 32 mmol/L (ref 22–32)
Calcium: 8.6 mg/dL — ABNORMAL LOW (ref 8.9–10.3)
Creatinine, Ser: 0.68 mg/dL (ref 0.61–1.24)
GFR calc Af Amer: >60 mL/min (ref >60)
GLUCOSE: 127 mg/dL — AB (ref 65–99)
POTASSIUM: 3.9 mmol/L (ref 3.5–5.1)
Sodium: 139 mmol/L (ref 135–145)

## 2016-01-04 LAB — CBC WITH DIFFERENTIAL/PLATELET
BASOS ABS: 0 10*3/uL (ref 0.0–0.1)
Basophils Relative: 0 %
EOS PCT: 4 %
Eosinophils Absolute: 0.4 10*3/uL (ref 0.0–0.7)
HCT: 40.6 % (ref 39.0–52.0)
HEMOGLOBIN: 12 g/dL — AB (ref 13.0–17.0)
LYMPHS ABS: 1.4 10*3/uL (ref 0.7–4.0)
LYMPHS PCT: 15 %
MCH: 28.1 pg (ref 26.0–34.0)
MCHC: 29.6 g/dL — ABNORMAL LOW (ref 30.0–36.0)
MCV: 95.1 fL (ref 78.0–100.0)
Monocytes Absolute: 0.9 10*3/uL (ref 0.1–1.0)
Monocytes Relative: 10 %
NEUTROS ABS: 6.4 10*3/uL (ref 1.7–7.7)
NEUTROS PCT: 71 %
PLATELETS: 341 10*3/uL (ref 150–400)
RBC: 4.27 MIL/uL (ref 4.22–5.81)
RDW: 15.7 % — ABNORMAL HIGH (ref 11.5–15.5)
WBC: 9.1 10*3/uL (ref 4.0–10.5)

## 2016-01-04 LAB — VITAMIN B12: Vitamin B-12: 313 pg/mL (ref 180–914)

## 2016-01-04 LAB — IRON AND TIBC
IRON: 46 ug/dL (ref 45–182)
Saturation Ratios: 18 % (ref 17.9–39.5)
TIBC: 262 ug/dL (ref 250–450)
UIBC: 216 ug/dL

## 2016-01-04 LAB — FERRITIN: FERRITIN: 105 ng/mL (ref 24–336)

## 2016-01-05 LAB — FOLATE RBC
FOLATE, RBC: 953 ng/mL (ref >498)
Folate, Hemolysate: 354.6 ng/mL
HEMATOCRIT: 37.2 % — AB (ref 37.5–51.0)

## 2016-01-17 ENCOUNTER — Encounter: Payer: Self-pay | Admitting: Internal Medicine

## 2016-01-17 ENCOUNTER — Non-Acute Institutional Stay (SKILLED_NURSING_FACILITY): Payer: Medicare Other | Admitting: Internal Medicine

## 2016-01-17 DIAGNOSIS — L03116 Cellulitis of left lower limb: Secondary | ICD-10-CM | POA: Diagnosis not present

## 2016-01-17 DIAGNOSIS — E119 Type 2 diabetes mellitus without complications: Secondary | ICD-10-CM | POA: Diagnosis not present

## 2016-01-17 DIAGNOSIS — I482 Chronic atrial fibrillation, unspecified: Secondary | ICD-10-CM

## 2016-01-17 DIAGNOSIS — D638 Anemia in other chronic diseases classified elsewhere: Secondary | ICD-10-CM | POA: Diagnosis not present

## 2016-01-17 NOTE — Progress Notes (Signed)
Location:   [facility] Room Number: 147/W Place of Service:  SNF (31)  Provider: Makaiah Terwilliger,Danielys Madry  PCP: Pamelia Hoit, MD Patient Care Team: [name], MD as PCP - General (Family Medicine) Laqueta Linden, MD as Attending Physician (Cardiology)  Extended Emergency Contact Information Primary Emergency Contact: Gayler,[name] Address: [address]          [place], [place] [address] [place] Home Phone: [phone] Relation: Spouse Secondary Emergency Contact: [name] Address: [address]          [place], [place] [address] [place] Home Phone: [phone] Relation: Grandaughter  Code Status: DNR Goals of care:  Advanced Directive information Advanced Directives 01/17/2016  Does patient have an advance directive? Yes  Type of Advance Directive Out of facility DNR (pink MOST or yellow form)  Does patient want to make changes to advanced directive? No - Patient declined  Copy of advanced directive(s) in chart? Yes  Would patient like information on creating an advanced directive? -  Pre-existing out of facility DNR order (yellow form or pink MOST form) -     Allergies  Allergen Reactions  . Furosemide Other (See Comments)    Dizziness , drowsiness    Chief Complaint  Patient presents with  . Discharge Note    HPI:  70 y.o. male  seen today for discharge from facility later this week. He was hospitalized recently for left leg cellulitis-ED is morbidly obese with chronic venous stasis changes but he says he's lost about 30 pounds over the last few weeks secondary to better dietary control.  He initially was admitted to the hospital in early August for cellulitis and fever and was discharged home but was readmitted in September with recurrence of infection.  He was discharged on Kerlix with dressing with Silvadene.  Also received a short course of Keflex.  The hospital he also has issues with  hypotension and rate control for his atrial fibrillation but this is stabilized.  He was switched from verapamil to Cardizem and metoprolol because of the hypotension and this appears to have had a beneficial effect with rate currently controlled pulses largely in the 70s to 80s-blood pressure stable most recently 130/74-108/66-132/72.  Currently he is sitting in his wheelchair comfortably he has no acute complaints did not complain of shortness of breath chest pain has progressed with therapy-his obesity continues to be somewhat of an issue with ambulation. He does have a wound on his left ischium stasis ulcer on the left lower extremity both are resolving well per discussion with nursing  He does live with his wife you will need a wheelchair for weight up to 330 pounds-he will need PT and OT for further strengthening and nursing follow-up on his multiple medical issues.  Of note he does have history of diabetes type 2 he is on [place] as well a  Ivokana and Glucophage blood sugars appear to be well controlled largely in the mid 100s it appears.         Past Medical History:  Diagnosis Date  . Atrial fibrillation (HCC)    On Pradaxa  . Bilateral lower extremity edema   . DVT (deep venous thrombosis) (HCC) 1994   LLE. Completed tx with coumadin  . Hyperlipidemia   . Hypertension   . Kidney stones   . Morbid obesity (HCC)   . Pulmonary artery hypertension 10/18/2011  . Type 2 diabetes mellitus (HCC)   . Urinary retention 10/15/2011  .  Venous stasis ulcers (HCC)     Past Surgical History:  Procedure Laterality Date  . CYSTOSCOPY  10/18/2011   Procedure: CYSTOSCOPY FLEXIBLE;  Surgeon: Ky Barban, MD;  Location: [facility];  Service: Urology;  Laterality: N/A;  . HERNIA REPAIR     Umbilical      reports that he has never smoked. He has never used smokeless tobacco. He reports that he does not drink alcohol or use drugs. Social History   Social History  . Marital status:  Married    Spouse name: N/A  . Number of children: N/A  . Years of education: N/A   Occupational History  . Not on file.   Social History Main Topics  . Smoking status: Never Smoker  . Smokeless tobacco: Never Used  . Alcohol use No  . Drug use: No  . Sexual activity: Not on file   Other Topics Concern  . Not on file   Social History Narrative  . No narrative on file   Functional Status Survey:    Allergies  Allergen Reactions  . Furosemide Other (See Comments)    Dizziness , drowsiness    Pertinent  Health Maintenance Due  Topic Date Due  . FOOT EXAM  03/27/1956  . OPHTHALMOLOGY EXAM  03/27/1956  . URINE MICROALBUMIN  03/27/1956  . COLONOSCOPY  03/27/1995  . PNA vac Low Risk Adult (2 of 2 - PCV13) 03/17/2015  . INFLUENZA VACCINE  03/10/2016 (Originally 11/09/2015)  . HEMOGLOBIN A1C  05/31/2016    Medications: Current Outpatient Prescriptions on File Prior to Visit  Medication Sig Dispense Refill  . canagliflozin (INVOKANA) 100 MG TABS tablet Take 100 mg by mouth daily.    . dabigatran (PRADAXA) 150 MG CAPS capsule Take 1 capsule (150 mg total) by mouth every 12 (twelve) hours. 60 capsule 2  . digoxin (LANOXIN) 0.125 MG tablet Take 1 tablet (0.125 mg total) by mouth daily.    Marland Kitchen diltiazem (CARDIZEM CD) 360 MG 24 hr capsule Take 1 capsule (360 mg total) by mouth daily.    . Insulin Degludec (TRESIBA FLEXTOUCH) 200 UNIT/ML SOPN Give 30 units subcutaneous once a day    . lisinopril (PRINIVIL,ZESTRIL) 2.5 MG tablet Take 2 tablets (5 mg total) by mouth daily.    . metFORMIN (GLUCOPHAGE) 500 MG tablet Take 1,000 mg by mouth every evening.     . metoprolol tartrate (LOPRESSOR) 25 MG tablet Take 1 tablet (25 mg total) by mouth 2 (two) times daily.    Marland Kitchen nystatin ointment (MYCOSTATIN) Apply topically 2 (two) times daily. Perineum, both thighs, abdominal pannus. 30 g 0  . polyethylene glycol (MIRALAX / GLYCOLAX) packet Take 17 g by mouth daily as needed.    . pravastatin  (PRAVACHOL) 20 MG tablet Take 20 mg by mouth daily.    . silver sulfADIAZINE (SILVADENE) 1 % cream Apply topically 3 (three) times a week. Applied to the left leg 3 times a day and to the right leg when necessary.    . traMADol (ULTRAM) 50 MG tablet Take 1 tablet (50 mg total) by mouth every 6 (six) hours as needed for moderate pain. 30 tablet 0   No current facility-administered medications on file prior to visit.      Review of Systems   In general no complaints of fever or chills says he feels well.  Skin does have a history of cellulitis lower legs apparently this has improved they are currently wrapped he has completed antibiotic treatment.-She does have  a wound on left ischium and a stasis ulcer on the left lower extremity that apparently are resolving per nursing  Head ears eyes nose mouth and throat does not complain of visual changes or sore throat.  Respiratory denies shortness breath or cough he does have a significant history of tobacco use but no longer smokes.  Cardiac does not complain of chest pain palpitations has significant venous stasis edema bilaterally.  GI is not complaining of nausea vomiting diarrhea constipation or abdominal discomfort.  GU does not complain of dysuria.  Muscle skeletal has lower extremity weakness secondary to morbid obesity but is not complaining of joint pain currently.  Neurologic is not complaining of dizziness headache or numbness.  Psyche--t does not complain of any overt anxiety or depression  Vitals:   01/17/16 1216  BP: 130/74  Pulse: 88  Resp: 20  Temp: 98.6 F (37 C)  TempSrc: Oral  SpO2: 98%   There is no height or weight on file to calculate BMI. Physical Exam  In general this is a very pleasant elderly male in no distress sitting comfortably in his wheelchair.  He is morbidly obese.  Skin is warm and dry lower legs bilaterally are currently wrapped with Ace wrap I do not see any erythema extending beyond the  wrap-he does not appear to be having significant pain with his leg Does have a history of the wound on his left ischium and a stasis ulcer left lower extremity again these areas are covered and thought to be resolving.  Eyes pupils appear reactive to light sclerae nonicteric clear visual acuity appears grossly intact she has prescription lenses.  Oropharynx clear mucous membranes moist he has numerous extractions.  Chest is clear to auscultation with somewhat shallow air entry no labored breathing.  Heart is regular irregular rate and rhythm he has again significant venous stasis changes edema lower extremities.  Abdomen is soft nontender with positive bowel sounds it is morbidly obese.  Musculoskeletal does move all extremities 4 with significant lower extremity weakness with moderate obesity and venous stasis changes he ambulates in a wheelchair.  Neurologic is grossly intact to speech is clear no lateralizing findings.  Psych he is alert and oriented very pleasant and appropriate.     Labs reviewed: Basic Metabolic Panel:  Recent Labs  16/01/9608/14/17 0531 12/25/15 0432 01/04/16 0500  NA 137 138 139  K 3.8 3.9 3.9  CL 107 105 101  CO2 23 27 32  GLUCOSE 73 100* 127*  BUN 17 12 17   CREATININE 0.73 0.65 0.68  CALCIUM 7.9* 8.2* 8.6*   Liver Function Tests:  Recent Labs  11/27/15 1840 11/28/15 0434 12/22/15 0509  AST 20 21 19   ALT 15* 15* 13*  ALKPHOS 64 61 77  BILITOT 1.9* 1.6* 0.7  PROT 6.9 6.6 6.1*  ALBUMIN 3.3* 3.1* 2.0*   No results for input(s): LIPASE, AMYLASE in the last 8760 hours. No results for input(s): AMMONIA in the last 8760 hours. CBC:  Recent Labs  11/27/15 1840  12/21/15 1534 12/22/15 0509 12/23/15 0531 01/04/16 0500  WBC 19.6*  < > 18.6* 12.0* 9.6 9.1  NEUTROABS 18.1*  --  16.0*  --   --  6.4  HGB 12.9*  < > 12.0* 10.2* 10.0* 12.0*  HCT 40.7  < > 37.8* 32.6* 32.7* 40.6  37.2*  MCV 92.7  < > 90.9 91.1 92.4 95.1  PLT 212  < > 411* 299 256  341  < > = values in this  interval not displayed. Cardiac Enzymes: No results for input(s): CKTOTAL, CKMB, CKMBINDEX, TROPONINI in the last 8760 hours. BNP: Invalid input(s): POCBNP CBG:  Recent Labs  12/26/15 2057 12/27/15 0821 12/27/15 1145  GLUCAP 200* 124* 163*    Procedures and Imaging Studies During Stay: No results found.  Assessment/Plan:    Cellulitis of left leg-she has completed antibiotic treatment is receiving topical treatment and followed closely by wound care he does have a left ischial wound and stasis ulcer on the left lower extremity that apparently are doing quite well this will need follow-up-he will need continued PT and OT secondary to significant weakness largely on [place] no wheelchair currently. He does receive Silvadene to the left leg as well as Santyl  to the left ischium wound  #2 history of chronic atrial fibrillation this appears rate controlled currently on Cardizem and metoprolol--also on digoxin- blood pressures are stable as noted above -she is on Pradaxa for anticoagulation.  #3 diabetes this appears under adequate control as noted above blood sugars largely in the mid 100s  on numerous agents including Glucophage Ivokana, and Tresiba  #4 morbid obesity with weakness again he has to does somewhat therapy will benefit from continued therapy is also motivated to lose weight apparently is lost about 30 pounds over the last several weeks with better dietary control.  #5 history of anemia of chronic disease globin actually appears to be improving most recently up to 12 on lab done 01/04/2016-will defer follow-up to primary care provider iron studies showed low normal iron.   #6 history hypertension as noted above this appears stable currently on lisinopril 2.5 mg a day as well as Cardizem to 60 mg a day and metoprolol 25 mg twice a day-   Patient is being discharged with the following home health services:  PT OT for strengthening as well as  nursing support for his multiple medical issues  Patient is being discharged with the following durable medical equipment:  Wheelchair for weight up to 330 pounds  Patient has been advised to f/u with their PCP in 1-2 weeks to bring them up to date on their rehab stay.  Social services at facility was responsible for arranging this appointment.  Pt was provided with a 30 day supply of prescriptions for medications and refills must be obtained from their PCP.  For controlled substances, a more limited supply may be provided adequate until PCP appointment only.    [id number] note greater than 30 minutes spent on this discharge summary-greater  than 50% of time spent coordinating plan of care for numerous diagnoses

## 2016-12-28 ENCOUNTER — Encounter (HOSPITAL_COMMUNITY): Payer: Self-pay | Admitting: Family Medicine

## 2016-12-28 ENCOUNTER — Emergency Department (HOSPITAL_COMMUNITY): Payer: Medicare Other

## 2016-12-28 ENCOUNTER — Inpatient Hospital Stay (HOSPITAL_COMMUNITY)
Admission: EM | Admit: 2016-12-28 | Discharge: 2017-01-01 | DRG: 871 | Disposition: A | Discharge status: Home Health | Payer: Medicare Other | Attending: Internal Medicine | Admitting: Internal Medicine

## 2016-12-28 DIAGNOSIS — R262 Difficulty in walking, not elsewhere classified: Secondary | ICD-10-CM | POA: Diagnosis present

## 2016-12-28 DIAGNOSIS — M170 Bilateral primary osteoarthritis of knee: Secondary | ICD-10-CM | POA: Diagnosis present

## 2016-12-28 DIAGNOSIS — M19071 Primary osteoarthritis, right ankle and foot: Secondary | ICD-10-CM | POA: Diagnosis present

## 2016-12-28 DIAGNOSIS — M19072 Primary osteoarthritis, left ankle and foot: Secondary | ICD-10-CM | POA: Diagnosis present

## 2016-12-28 DIAGNOSIS — Z87442 Personal history of urinary calculi: Secondary | ICD-10-CM | POA: Diagnosis not present

## 2016-12-28 DIAGNOSIS — Z86718 Personal history of other venous thrombosis and embolism: Secondary | ICD-10-CM

## 2016-12-28 DIAGNOSIS — I83009 Varicose veins of unspecified lower extremity with ulcer of unspecified site: Secondary | ICD-10-CM | POA: Diagnosis present

## 2016-12-28 DIAGNOSIS — E875 Hyperkalemia: Secondary | ICD-10-CM | POA: Diagnosis not present

## 2016-12-28 DIAGNOSIS — L97921 Non-pressure chronic ulcer of unspecified part of left lower leg limited to breakdown of skin: Secondary | ICD-10-CM | POA: Diagnosis present

## 2016-12-28 DIAGNOSIS — I83028 Varicose veins of left lower extremity with ulcer other part of lower leg: Secondary | ICD-10-CM | POA: Diagnosis present

## 2016-12-28 DIAGNOSIS — L97911 Non-pressure chronic ulcer of unspecified part of right lower leg limited to breakdown of skin: Secondary | ICD-10-CM | POA: Diagnosis present

## 2016-12-28 DIAGNOSIS — Z794 Long term (current) use of insulin: Secondary | ICD-10-CM | POA: Diagnosis not present

## 2016-12-28 DIAGNOSIS — I482 Chronic atrial fibrillation: Secondary | ICD-10-CM | POA: Diagnosis present

## 2016-12-28 DIAGNOSIS — Z79899 Other long term (current) drug therapy: Secondary | ICD-10-CM

## 2016-12-28 DIAGNOSIS — A419 Sepsis, unspecified organism: Secondary | ICD-10-CM | POA: Diagnosis not present

## 2016-12-28 DIAGNOSIS — I10 Essential (primary) hypertension: Secondary | ICD-10-CM | POA: Diagnosis present

## 2016-12-28 DIAGNOSIS — E785 Hyperlipidemia, unspecified: Secondary | ICD-10-CM | POA: Diagnosis present

## 2016-12-28 DIAGNOSIS — J9601 Acute respiratory failure with hypoxia: Secondary | ICD-10-CM | POA: Diagnosis present

## 2016-12-28 DIAGNOSIS — R6521 Severe sepsis with septic shock: Secondary | ICD-10-CM | POA: Diagnosis present

## 2016-12-28 DIAGNOSIS — I83014 Varicose veins of right lower extremity with ulcer of heel and midfoot: Secondary | ICD-10-CM | POA: Diagnosis not present

## 2016-12-28 DIAGNOSIS — Y95 Nosocomial condition: Secondary | ICD-10-CM | POA: Diagnosis not present

## 2016-12-28 DIAGNOSIS — D638 Anemia in other chronic diseases classified elsewhere: Secondary | ICD-10-CM | POA: Diagnosis present

## 2016-12-28 DIAGNOSIS — L97909 Non-pressure chronic ulcer of unspecified part of unspecified lower leg with unspecified severity: Secondary | ICD-10-CM

## 2016-12-28 DIAGNOSIS — R159 Full incontinence of feces: Secondary | ICD-10-CM | POA: Diagnosis present

## 2016-12-28 DIAGNOSIS — N179 Acute kidney failure, unspecified: Secondary | ICD-10-CM | POA: Diagnosis present

## 2016-12-28 DIAGNOSIS — Z833 Family history of diabetes mellitus: Secondary | ICD-10-CM

## 2016-12-28 DIAGNOSIS — Z9109 Other allergy status, other than to drugs and biological substances: Secondary | ICD-10-CM

## 2016-12-28 DIAGNOSIS — M1990 Unspecified osteoarthritis, unspecified site: Secondary | ICD-10-CM | POA: Diagnosis present

## 2016-12-28 DIAGNOSIS — R29898 Other symptoms and signs involving the musculoskeletal system: Secondary | ICD-10-CM | POA: Diagnosis present

## 2016-12-28 DIAGNOSIS — K529 Noninfective gastroenteritis and colitis, unspecified: Secondary | ICD-10-CM | POA: Diagnosis present

## 2016-12-28 DIAGNOSIS — Z7901 Long term (current) use of anticoagulants: Secondary | ICD-10-CM

## 2016-12-28 DIAGNOSIS — L97411 Non-pressure chronic ulcer of right heel and midfoot limited to breakdown of skin: Secondary | ICD-10-CM | POA: Diagnosis not present

## 2016-12-28 DIAGNOSIS — J181 Lobar pneumonia, unspecified organism: Secondary | ICD-10-CM | POA: Diagnosis not present

## 2016-12-28 DIAGNOSIS — I4891 Unspecified atrial fibrillation: Secondary | ICD-10-CM | POA: Diagnosis not present

## 2016-12-28 DIAGNOSIS — E11622 Type 2 diabetes mellitus with other skin ulcer: Secondary | ICD-10-CM | POA: Diagnosis present

## 2016-12-28 DIAGNOSIS — I83018 Varicose veins of right lower extremity with ulcer other part of lower leg: Secondary | ICD-10-CM | POA: Diagnosis present

## 2016-12-28 DIAGNOSIS — R197 Diarrhea, unspecified: Secondary | ICD-10-CM | POA: Diagnosis present

## 2016-12-28 DIAGNOSIS — I2721 Secondary pulmonary arterial hypertension: Secondary | ICD-10-CM | POA: Diagnosis present

## 2016-12-28 DIAGNOSIS — Z825 Family history of asthma and other chronic lower respiratory diseases: Secondary | ICD-10-CM

## 2016-12-28 DIAGNOSIS — Z6841 Body Mass Index (BMI) 40.0 and over, adult: Secondary | ICD-10-CM | POA: Diagnosis not present

## 2016-12-28 DIAGNOSIS — L98411 Non-pressure chronic ulcer of buttock limited to breakdown of skin: Secondary | ICD-10-CM | POA: Diagnosis present

## 2016-12-28 DIAGNOSIS — J189 Pneumonia, unspecified organism: Secondary | ICD-10-CM | POA: Insufficient documentation

## 2016-12-28 DIAGNOSIS — E119 Type 2 diabetes mellitus without complications: Secondary | ICD-10-CM | POA: Diagnosis not present

## 2016-12-28 DIAGNOSIS — R251 Tremor, unspecified: Secondary | ICD-10-CM | POA: Diagnosis not present

## 2016-12-28 DIAGNOSIS — Z23 Encounter for immunization: Secondary | ICD-10-CM | POA: Diagnosis present

## 2016-12-28 DIAGNOSIS — Z888 Allergy status to other drugs, medicaments and biological substances status: Secondary | ICD-10-CM

## 2016-12-28 DIAGNOSIS — G4730 Sleep apnea, unspecified: Secondary | ICD-10-CM | POA: Diagnosis present

## 2016-12-28 LAB — URINALYSIS, ROUTINE W REFLEX MICROSCOPIC
BILIRUBIN URINE: NEGATIVE
GLUCOSE, UA: NEGATIVE mg/dL
HGB URINE DIPSTICK: NEGATIVE
Ketones, ur: NEGATIVE mg/dL
LEUKOCYTES UA: NEGATIVE
NITRITE: NEGATIVE
PROTEIN: 30 mg/dL — AB
SPECIFIC GRAVITY, URINE: 1.018 (ref 1.005–1.030)
pH: 5 (ref 5.0–8.0)

## 2016-12-28 LAB — CBC WITH DIFFERENTIAL/PLATELET
Basophils Absolute: 0 10*3/uL (ref 0.0–0.1)
Basophils Relative: 0 %
Eosinophils Absolute: 0 10*3/uL (ref 0.0–0.7)
Eosinophils Relative: 0 %
HEMATOCRIT: 42.3 % (ref 39.0–52.0)
HEMOGLOBIN: 12.3 g/dL — AB (ref 13.0–17.0)
LYMPHS ABS: 2.2 10*3/uL (ref 0.7–4.0)
Lymphocytes Relative: 13 %
MCH: 26.1 pg (ref 26.0–34.0)
MCHC: 29.1 g/dL — AB (ref 30.0–36.0)
MCV: 89.6 fL (ref 78.0–100.0)
MONOS PCT: 6 %
Monocytes Absolute: 1.1 10*3/uL — ABNORMAL HIGH (ref 0.1–1.0)
NEUTROS ABS: 14.1 10*3/uL — AB (ref 1.7–7.7)
NEUTROS PCT: 81 %
Platelets: 409 10*3/uL — ABNORMAL HIGH (ref 150–400)
RBC: 4.72 MIL/uL (ref 4.22–5.81)
RDW: 17.5 % — ABNORMAL HIGH (ref 11.5–15.5)
WBC: 17.4 10*3/uL — ABNORMAL HIGH (ref 4.0–10.5)

## 2016-12-28 LAB — BASIC METABOLIC PANEL
ANION GAP: 14 (ref 5–15)
BUN: 49 mg/dL — ABNORMAL HIGH (ref 6–20)
CO2: 27 mmol/L (ref 22–32)
Calcium: 9.2 mg/dL (ref 8.9–10.3)
Chloride: 99 mmol/L — ABNORMAL LOW (ref 101–111)
Creatinine, Ser: 2.13 mg/dL — ABNORMAL HIGH (ref 0.61–1.24)
GFR calc Af Amer: 34 mL/min — ABNORMAL LOW (ref >60)
GFR calc non Af Amer: 30 mL/min — ABNORMAL LOW (ref >60)
GLUCOSE: 159 mg/dL — AB (ref 65–99)
POTASSIUM: 5.7 mmol/L — AB (ref 3.5–5.1)
Sodium: 140 mmol/L (ref 135–145)

## 2016-12-28 LAB — HEPATIC FUNCTION PANEL
ALT: 26 U/L (ref 17–63)
AST: 32 U/L (ref 15–41)
Albumin: 3.4 g/dL — ABNORMAL LOW (ref 3.5–5.0)
Alkaline Phosphatase: 80 U/L (ref 38–126)
BILIRUBIN DIRECT: 0.2 mg/dL (ref 0.1–0.5)
BILIRUBIN INDIRECT: 0.5 mg/dL (ref 0.3–0.9)
TOTAL PROTEIN: 7.2 g/dL (ref 6.5–8.1)
Total Bilirubin: 0.7 mg/dL (ref 0.3–1.2)

## 2016-12-28 LAB — CBG MONITORING, ED
GLUCOSE-CAPILLARY: 67 mg/dL (ref 65–99)
Glucose-Capillary: 128 mg/dL — ABNORMAL HIGH (ref 65–99)

## 2016-12-28 LAB — DIGOXIN LEVEL: Digoxin Level: 0.9 ng/mL (ref 0.8–2.0)

## 2016-12-28 LAB — I-STAT CG4 LACTIC ACID, ED
Lactic Acid, Venous: 3.79 mmol/L (ref 0.5–1.9)
Lactic Acid, Venous: 0.56 mmol/L (ref 0.5–1.9)

## 2016-12-28 LAB — GLUCOSE, CAPILLARY
GLUCOSE-CAPILLARY: 74 mg/dL (ref 65–99)
Glucose-Capillary: 90 mg/dL (ref 65–99)

## 2016-12-28 LAB — MRSA PCR SCREENING: MRSA BY PCR: POSITIVE — AB

## 2016-12-28 LAB — POTASSIUM: Potassium: 4.7 mmol/L (ref 3.5–5.1)

## 2016-12-28 LAB — LACTIC ACID, PLASMA: Lactic Acid, Venous: 2.1 mmol/L (ref 0.5–1.9)

## 2016-12-28 LAB — AMMONIA: Ammonia: 38 umol/L — ABNORMAL HIGH (ref 9–35)

## 2016-12-28 LAB — PROCALCITONIN: Procalcitonin: <0.1 ng/mL

## 2016-12-28 MED ORDER — DIGOXIN 125 MCG PO TABS
0.1250 mg | ORAL_TABLET | Freq: Every day | ORAL | Status: DC
  Administered 2016-12-29 – 2017-01-01 (×4): 0.125 mg via ORAL
  Filled 2016-12-28 (×4): qty 1

## 2016-12-28 MED ORDER — SODIUM CHLORIDE 0.9 % IV BOLUS (SEPSIS)
1000.0000 mL | Freq: Once | INTRAVENOUS | Status: AC
  Administered 2016-12-28: 1000 mL via INTRAVENOUS

## 2016-12-28 MED ORDER — GABAPENTIN 300 MG PO CAPS
300.0000 mg | ORAL_CAPSULE | Freq: Three times a day (TID) | ORAL | Status: DC
  Administered 2016-12-28 – 2017-01-01 (×11): 300 mg via ORAL
  Filled 2016-12-28 (×11): qty 1

## 2016-12-28 MED ORDER — ZINC SULFATE 220 (50 ZN) MG PO CAPS
220.0000 mg | ORAL_CAPSULE | Freq: Every day | ORAL | Status: DC
  Administered 2016-12-29 – 2017-01-01 (×4): 220 mg via ORAL
  Filled 2016-12-28 (×4): qty 1

## 2016-12-28 MED ORDER — SODIUM CHLORIDE 0.9 % IV SOLN
INTRAVENOUS | Status: DC
  Administered 2016-12-28: 16:00:00 via INTRAVENOUS

## 2016-12-28 MED ORDER — SODIUM CHLORIDE 0.9 % IV SOLN
INTRAVENOUS | Status: AC
  Administered 2016-12-28: via INTRAVENOUS

## 2016-12-28 MED ORDER — VITAMIN C 500 MG PO TABS
500.0000 mg | ORAL_TABLET | Freq: Two times a day (BID) | ORAL | Status: DC
  Administered 2016-12-28 – 2017-01-01 (×8): 500 mg via ORAL
  Filled 2016-12-28 (×13): qty 1

## 2016-12-28 MED ORDER — PRO-STAT SUGAR FREE PO LIQD
45.0000 mL | Freq: Three times a day (TID) | ORAL | Status: DC
  Administered 2016-12-29 – 2017-01-01 (×9): 45 mL via ORAL
  Filled 2016-12-28 (×9): qty 60

## 2016-12-28 MED ORDER — INSULIN ASPART 100 UNIT/ML ~~LOC~~ SOLN
0.0000 [IU] | SUBCUTANEOUS | Status: DC
  Administered 2016-12-29 (×2): 1 [IU] via SUBCUTANEOUS
  Administered 2016-12-29: 2 [IU] via SUBCUTANEOUS

## 2016-12-28 MED ORDER — INSULIN GLARGINE 100 UNIT/ML ~~LOC~~ SOLN
15.0000 [IU] | Freq: Every day | SUBCUTANEOUS | Status: DC
  Administered 2016-12-29: 15 [IU] via SUBCUTANEOUS
  Filled 2016-12-28 (×3): qty 0.15

## 2016-12-28 MED ORDER — VANCOMYCIN HCL IN DEXTROSE 1-5 GM/200ML-% IV SOLN
1000.0000 mg | Freq: Once | INTRAVENOUS | Status: AC
  Administered 2016-12-28: 1000 mg via INTRAVENOUS
  Filled 2016-12-28: qty 200

## 2016-12-28 MED ORDER — CHLORHEXIDINE GLUCONATE 0.12 % MT SOLN
15.0000 mL | Freq: Two times a day (BID) | OROMUCOSAL | Status: DC
  Administered 2016-12-29 – 2017-01-01 (×8): 15 mL via OROMUCOSAL
  Filled 2016-12-28 (×8): qty 15

## 2016-12-28 MED ORDER — ATORVASTATIN CALCIUM 10 MG PO TABS
10.0000 mg | ORAL_TABLET | Freq: Every day | ORAL | Status: DC
  Administered 2016-12-28 – 2016-12-31 (×4): 10 mg via ORAL
  Filled 2016-12-28 (×4): qty 1

## 2016-12-28 MED ORDER — VANCOMYCIN HCL 10 G IV SOLR
1500.0000 mg | INTRAVENOUS | Status: DC
  Filled 2016-12-28 (×3): qty 1500

## 2016-12-28 MED ORDER — INFLUENZA VAC SPLIT HIGH-DOSE 0.5 ML IM SUSY
0.5000 mL | PREFILLED_SYRINGE | INTRAMUSCULAR | Status: AC
  Administered 2016-12-29: 0.5 mL via INTRAMUSCULAR
  Filled 2016-12-28 (×2): qty 0.5

## 2016-12-28 MED ORDER — SODIUM CHLORIDE 0.9 % IV BOLUS (SEPSIS): 1000.0000 mL | Freq: Once | INTRAVENOUS | Status: DC

## 2016-12-28 MED ORDER — DEXTROSE 5 % IV SOLN
2.0000 g | INTRAVENOUS | Status: DC
  Administered 2016-12-28: 2 g via INTRAVENOUS
  Filled 2016-12-28 (×4): qty 2

## 2016-12-28 MED ORDER — IPRATROPIUM-ALBUTEROL 0.5-2.5 (3) MG/3ML IN SOLN: 3.0000 mL | RESPIRATORY_TRACT | Status: DC | PRN

## 2016-12-28 MED ORDER — LORAZEPAM 2 MG/ML IJ SOLN: 0.5000 mg | Freq: Once | INTRAMUSCULAR | Status: DC

## 2016-12-28 MED ORDER — VITAMIN D 1000 UNITS PO TABS
2000.0000 [IU] | ORAL_TABLET | Freq: Every day | ORAL | Status: DC
  Administered 2016-12-29 – 2017-01-01 (×4): 2000 [IU] via ORAL
  Filled 2016-12-28 (×4): qty 2

## 2016-12-28 MED ORDER — DEXTROSE 5 % IV SOLN
500.0000 mg | Freq: Once | INTRAVENOUS | Status: DC
  Filled 2016-12-28: qty 500

## 2016-12-28 MED ORDER — GUAIFENESIN ER 600 MG PO TB12
600.0000 mg | ORAL_TABLET | Freq: Two times a day (BID) | ORAL | Status: DC
  Administered 2016-12-28 – 2017-01-01 (×8): 600 mg via ORAL
  Filled 2016-12-28 (×8): qty 1

## 2016-12-28 MED ORDER — DABIGATRAN ETEXILATE MESYLATE 150 MG PO CAPS
150.0000 mg | ORAL_CAPSULE | Freq: Two times a day (BID) | ORAL | Status: DC
  Administered 2016-12-28 – 2017-01-01 (×8): 150 mg via ORAL
  Filled 2016-12-28 (×12): qty 1

## 2016-12-28 MED ORDER — DEXTROSE 5 % IV SOLN
1.0000 g | Freq: Once | INTRAVENOUS | Status: AC
  Administered 2016-12-28: 1 g via INTRAVENOUS
  Filled 2016-12-28: qty 10

## 2016-12-28 MED ORDER — ORAL CARE MOUTH RINSE
15.0000 mL | Freq: Two times a day (BID) | OROMUCOSAL | Status: DC
  Administered 2016-12-29 – 2016-12-31 (×6): 15 mL via OROMUCOSAL

## 2016-12-28 MED ORDER — DEXTROSE 5 % IV SOLN: 1.0000 g | Freq: Three times a day (TID) | INTRAVENOUS | Status: DC

## 2016-12-28 MED ORDER — HYDRALAZINE HCL 20 MG/ML IJ SOLN: 10.0000 mg | INTRAMUSCULAR | Status: DC | PRN

## 2016-12-28 MED ORDER — VERAPAMIL HCL 80 MG PO TABS
80.0000 mg | ORAL_TABLET | Freq: Three times a day (TID) | ORAL | Status: DC
  Administered 2016-12-29 – 2017-01-01 (×10): 80 mg via ORAL
  Filled 2016-12-28 (×10): qty 1

## 2016-12-28 NOTE — H&P (Signed)
History and Physical    VIJAY DURFLINGER WUJ:[id number] DOB: [birth year] DOA: 12/28/2016  PCP: [name], MD   Patient coming from: SNF  Chief Complaint: Hyperkalemia, PNA   HPI: Jonathan Wise is a 71 y.o. male with medical history significant for atrial fibrillation on Peridex, insulin-dependent diabetes mellitus, obesity, and hypertension, and into the emergency department from his SNF for evaluation of hyperkalemia. Patient has reportedly just been started on Rocephin at the nursing facility for suspected pneumonia and was in acute distress today. Blood work was obtained at the nursing facility and notable for a potassium of 7.4. Patient was treated with 30 g Kayexalate, dextrose with insulin, albuterol, and continued on his Rocephin. He was sent to the ED for further evaluation and management. Patient denied any specific complaints. He reports feeling unwell. He has been receiving wound care at the nursing facility for bilateral lower extremity ulcers attributed to chronic venous stasis.  ED Course: Upon arrival to the ED, patient is found to be afebrile, saturating adequately on room air initially, tachypneic, tachycardic, and with stable blood pressure. EKG features and undetermined rhythm with inferolateral ST abnormalities. Noncontrast head CT is negative for acute intracranial abnormality and chest x-ray demonstrates a right basilar consolidation consistent with pneumonia. Urinalysis is unremarkable. Chemstrip panel reveals a potassium of 5.7 and creatinine of 2.13, up from 0.68 in March of this year. CBC is notable for a leukocytosis to 17,400 and a stable normocytic anemia with hemoglobin of 12.3. Lactic acid is elevated to a value of 3.69. Blood cultures were collected in the emergency department and the patient was treated with 30 mL/kg of normal saline. Patient was placed on BiPAP and empiric Rocephin and azithromycin were ordered. He remained tachypneic and tachycardic in the ED with  stable blood pressure and will be admitted to the stepdown unit for ongoing evaluation and management of sepsis secondary to pneumonia with acute kidney injury and hyperkalemia.  Review of Systems:  All other systems reviewed and apart from HPI, are negative.  Past Medical History:  Diagnosis Date  . Atrial fibrillation (HCC)    On Pradaxa  . Bilateral lower extremity edema   . DVT (deep venous thrombosis) (HCC) 1994   LLE. Completed tx with coumadin  . Hyperlipidemia   . Hypertension   . Kidney stones   . Morbid obesity (HCC)   . Pulmonary artery hypertension (HCC) 10/18/2011  . Type 2 diabetes mellitus (HCC)   . Urinary retention 10/15/2011  . Venous stasis ulcers (HCC)     Past Surgical History:  Procedure Laterality Date  . CYSTOSCOPY  10/18/2011   Procedure: CYSTOSCOPY FLEXIBLE;  Surgeon: Ky Barban, MD;  Location: [facility];  Service: Urology;  Laterality: N/A;  . HERNIA REPAIR     Umbilical     reports that he has never smoked. He has never used smokeless tobacco. He reports that he does not drink alcohol or use drugs.  Allergies  Allergen Reactions  . Furosemide Other (See Comments)    Dizziness , drowsiness    Family History  Problem Relation Age of Onset  . Diabetes Mother   . Emphysema Father      Prior to Admission medications   Medication Sig Start Date End Date Taking? Authorizing Provider  Amino Acids-Protein Hydrolys (FEEDING SUPPLEMENT, PRO-STAT SUGAR FREE 64,) LIQD Take 45 mLs by mouth 3 (three) times daily with meals. For wound healing   Yes [provider]  atorvastatin (LIPITOR) 10 MG tablet Take  10 mg by mouth daily.   Yes [provider]  dabigatran (PRADAXA) 150 MG CAPS capsule Take 1 capsule (150 mg total) by mouth every 12 (twelve) hours. 12/01/15  Yes [name], [name], MD  digoxin (LANOXIN) 0.125 MG tablet Take 1 tablet (0.125 mg total) by mouth daily. 12/28/15  Yes [name], MD  diphenoxylate-atropine  (LOMOTIL) 2.5-0.025 MG tablet TAKE 1 TABLET BY MOUTH 4 TIMES A DAY FOR UP TO 10 DAYS AS NEEDED FOR DIARRHEA 11/26/16  Yes [provider]  Ergocalciferol (VITAMIN D2) 2000 units TABS Take 1 tablet by mouth daily.   Yes [provider]  gabapentin (NEURONTIN) 300 MG capsule Take 300 mg by mouth 3 (three) times daily.   Yes [provider]  guaiFENesin (MUCINEX) 600 MG 12 hr tablet Take 600 mg by mouth 2 (two) times daily.   Yes [provider]  Insulin Degludec (TRESIBA FLEXTOUCH) 200 UNIT/ML SOPN Inject 23 Units into the skin daily at 12 noon. Give in the afternoon.   Yes [provider]  ipratropium-albuterol (DUONEB) 0.5-2.5 (3) MG/3ML SOLN Take 3 mLs by nebulization every 6 (six) hours as needed (congestion).   Yes [provider]  metFORMIN (GLUCOPHAGE) 500 MG tablet Take 1,000 mg by mouth 2 (two) times daily with a meal.    Yes [provider]  Multiple Vitamins-Minerals (CERTAGEN PO) Take 1 tablet by mouth daily.   Yes [provider]  verapamil (CALAN) 80 MG tablet Take 80 mg by mouth 3 (three) times daily.   Yes [provider]  vitamin C (ASCORBIC ACID) 500 MG tablet Take 500 mg by mouth 2 (two) times daily.   Yes [provider]  zinc sulfate 220 (50 Zn) MG capsule Take 220 mg by mouth daily.   Yes [provider]  cefTRIAXone (ROCEPHIN) 1 g injection Inject 1 g into the muscle daily. For SOB/Wheezing 12/28/16   [provider]    Physical Exam: Vitals:   12/28/16 1720 12/28/16 1730 12/28/16 1800 12/28/16 1825  BP: (!) 94/58 (!) 86/72 (!) 98/53   Pulse: 98  (!) 119 (!) 121  Resp: (!) 22  Temp:      TempSrc:      SpO2: 100%  96% 97%  Weight:      Height:          Constitutional: Acutely-ill appearing with pallor and lethargy, no diaphoresis  Eyes: PERTLA, lids and conjunctivae normal ENMT: Mucous membranes are moist. Posterior pharynx clear of any exudate or  lesions.   Neck: normal, supple, no masses, no thyromegaly Respiratory: Rhonchi at right base and mid-lung zones. Mild tachypnea, dyspnea with speech. Increased WOB. No cyanosis.  Cardiovascular: Rate ~120 and regular. No significant JVD. Abdomen: No distension, no tenderness, no masses palpated. Bowel sounds active. Musculoskeletal: no clubbing / cyanosis. No joint deformity upper and lower extremities.   Skin: no significant rashes, lesions, ulcers. Warm, dry, well-perfused. Neurologic: CN 2-12 grossly intact. Sensation intact. Strength 5/5 in all 4 limbs.  Psychiatric: Lethargic, easily roused and oriented x 3. Calm, cooperative.     Labs on Admission: I have personally reviewed following labs and imaging studies  CBC:  Recent Labs Lab 12/28/16 1406  WBC 17.4*  NEUTROABS 14.1*  HGB 12.3*  HCT 42.3  MCV 89.6  PLT 409*   Basic Metabolic Panel:  Recent Labs Lab 12/28/16 1406  NA 140  K 5.7*  CL 99*  CO2 27  GLUCOSE 159*  BUN 49*  CREATININE 2.13*  CALCIUM 9.2   GFR: Estimated Creatinine Clearance: 44.6 mL/min (A) (by C-G formula based on SCr of 2.13 mg/dL (H)). Liver Function Tests:  Recent Labs Lab 12/28/16 1406  AST 32  ALT 26  ALKPHOS 80  BILITOT 0.7  PROT 7.2  ALBUMIN 3.4*   No results for input(s): LIPASE, AMYLASE in the last 168 hours.  Recent Labs Lab 12/28/16 1406  AMMONIA 38*   Coagulation Profile: No results for input(s): INR, PROTIME in the last 168 hours. Cardiac Enzymes: No results for input(s): CKTOTAL, CKMB, CKMBINDEX, TROPONINI in the last 168 hours. BNP (last 3 results) No results for input(s): PROBNP in the last 8760 hours. HbA1C: No results for input(s): HGBA1C in the last 72 hours. CBG:  Recent Labs Lab 12/28/16 1458  GLUCAP 128*   Lipid Profile: No results for input(s): CHOL, HDL, LDLCALC, TRIG, CHOLHDL, LDLDIRECT in the last 72 hours. Thyroid Function Tests: No results for input(s): TSH, T4TOTAL, FREET4, T3FREE,  THYROIDAB in the last 72 hours. Anemia Panel: No results for input(s): VITAMINB12, FOLATE, FERRITIN, TIBC, IRON, RETICCTPCT in the last 72 hours. Urine analysis:    Component Value Date/Time   COLORURINE AMBER (A) 12/28/2016 1353   APPEARANCEUR CLOUDY (A) 12/28/2016 1353   LABSPEC 1.018 12/28/2016 1353   PHURINE 5.0 12/28/2016 1353   GLUCOSEU NEGATIVE 12/28/2016 1353   HGBUR NEGATIVE 12/28/2016 1353   BILIRUBINUR NEGATIVE 12/28/2016 1353   KETONESUR NEGATIVE 12/28/2016 1353   PROTEINUR 30 (A) 12/28/2016 1353   UROBILINOGEN 0.2 03/15/2014 1254   NITRITE NEGATIVE 12/28/2016 1353   LEUKOCYTESUR NEGATIVE 12/28/2016 1353   Sepsis Labs: (procalcitonin:4,lacticidven:4) )No results found for this or any previous visit (from the past 240 hour(s)).   Radiological Exams on Admission: Dg Chest 2 View  Result Date: 12/28/2016 CLINICAL DATA:  Altered mental status EXAM: CHEST  2 VIEW COMPARISON:  November 27, 2015 FINDINGS: There is airspace consolidation in the posterior right base. Lungs elsewhere clear. Heart is mildly enlarged with pulmonary vascularity within normal limits. No adenopathy. No evident bone lesions. IMPRESSION: Airspace consolidation posterior right base consistent with pneumonia. Lungs elsewhere clear. Stable cardiac prominence. Followup PA and lateral chest radiographs recommended in 3-4 weeks following trial of antibiotic therapy to ensure resolution and exclude underlying malignancy. Electronically Signed   By: Bretta Bang III M.D.   On: 12/28/2016 17:01   Ct Head Wo Contrast  Result Date: 12/28/2016 CLINICAL DATA:  Altered level of consciousness. EXAM: CT HEAD WITHOUT CONTRAST TECHNIQUE: Contiguous axial images were obtained from the base of the skull through the vertex without intravenous contrast. COMPARISON:  CT scan of June 19, 2010. FINDINGS: Brain: Mild chronic ischemic white matter disease is noted. No mass effect or midline shift is noted. Ventricular  size is within normal limits. There is no evidence of mass lesion, hemorrhage or acute infarction. Vascular: No hyperdense vessel or unexpected calcification. Skull: Normal. Negative for fracture or focal lesion. Sinuses/Orbits: No acute finding. Other: None. IMPRESSION: Mild chronic ischemic white matter disease. No acute intracranial abnormality seen. Electronically Signed   By: Lupita Raider, M.D.   On: 12/28/2016 17:45    EKG: Independently reviewed. Undetermined rhythm, inferolateral ST-abnormality.   Assessment/Plan  1. Sepsis secondary to HCAP, acute hypoxic respiratory failure   - Pt presents from SNF where he had just been started on Rocephin for suspected PNA  - He is tachycardic on arrival with acute respiratory distress requiring BiPAP, and is found to have elevated  lactate and leukocytosis  - CXR findings consistent with right basilar PNA  - Blood cultures were collected in ED, 30 cc/kg NS was given, and empiric Rocephin and azithromycin were ordered  - Plan to add sputum cultures and check strep pneumo and legionella antigens; given residence at SNF, empiric abx will be continued with vancomycin and cefepime pending cultures and clinical course    2. Acute kidney injury  - SCr is 2.13 on admission, up from 0.68 in March 2018  - Likely secondary to sepsis, will exclude obstruction with renal US  - He has been fluid-resuscitated in ED with NS and continued on NS infusion  - Avoid nephrotoxins where possible, repeat chem panel in am    3. Hyperkalemia  - Serum potassium was reportedly 7.4 at the SNF and he was treated with 30 g kayexalate, insulin/dextrose, and albuterol prior to arrival in ED  - Potassium is 5.7 on chem panel in ED  - Likely secondary to acute renal failure  - Continue cardiac monitoring, follow serial potassium levels until resolved   4. Atrial fibrillation  - CHADS-VASc is at least 2 (age, DM) - Continue Pradaxa, verapamil, and digoxin    5.  Insulin-dependent DM  - A1c was 6.5% in March '18  - Managed with Evaristo Bury 23 units and metformin at the SNF, held on admission   - Check CBG q4h while on BiPAP, continue basal insulin with Lantus 15 units qD and start a low-intensity SSI with Novolog   6. Chronic venous stasis ulcers  - Reportedly improving with wound care at the SNF  - Wound care consultation requested for recs while inpatient     DVT prophylaxis: Pradaxa  Code Status: Full  Family Communication: Wife updated at bedside  Disposition Plan: Admit to SDU Consults called: None Admission status: Inpatient    Briscoe Deutscher, MD [facility] Pager [phone]  If 7PM-7AM, please contact night-coverage [email or web address] Password [email or web address]  12/28/2016, 7:06 PM

## 2016-12-28 NOTE — ED Triage Notes (Signed)
Elevated potassium. Pt has been alert oriented, lung sounds clear. [place] gave D50 and 10 insulin, pt glucose was 162. wo albuterol, 30grams of kaexalate, 10 units of humalong, D50, and rocephen. (given before EMS arrival)  Complaint: pt elevated potassium,, disoriented.

## 2016-12-28 NOTE — ED Provider Notes (Addendum)
[facility]-EMER[facility]Y DEPT Provider Note   CSN: [id number] Arrival date & time: 12/28/16  1332     History   Chief Complaint No chief complaint on file.   HPI Jonathan Wise is a 71 y.o. male.  Pt presents to the ED today with elevated potassium from SN[facility].  The pt said that he was his normal self, went back to his room and the next thing he knew, nurses were all around him.  The pt does not know if he fell asleep or what happened.  Blood was drawn which showed an elevated potassium.  Pt was treated with D50, 10 units insulin, 30 g kayexalate,  and albuterol. EMS reports that pt was altered.  Pt is not currently altered.  He said that he feels fine except for some twitching in his arms.  Potassium 7.4 at snf.      Past Medical History:  Diagnosis Date  . Atrial fibrillation (HCC)    On Pradaxa  . Bilateral lower extremity edema   . DVT (deep venous thrombosis) (HCC) 1994   LLE. Completed tx with coumadin  . Hyperlipidemia   . Hypertension   . Kidney stones   . Morbid obesity (HCC)   . Pulmonary artery hypertension (HCC) 10/18/2011  . Type 2 diabetes mellitus (HCC)   . Urinary retention 10/15/2011  . Venous stasis ulcers [facility])     Patient Active Problem List   Diagnosis Date Noted  . Hyperkalemia 12/28/2016  . Anemia of chronic disease 12/25/2015  . Candidal intertrigo 12/22/2015  . 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 Volume depletion 12/21/2015  . Bradycardia 03/15/2014  . UTI (urinary tract infection) 03/15/2014  . Leukocytosis 03/15/2014  . Lactic acidosis 03/15/2014  . Atrial fibrillation (HCC) 06/23/2012  . PVC's (premature ventricular contractions) 06/23/2012  . Sepsis due to pneumonia (HCC) 06/22/2012  . Morbid obesity (HCC) 06/22/2012  . Osteoarthritis of right knee 06/22/2012  . Current use of long term anticoagulation 06/22/2012  . Pulmonary artery hypertension (HCC) 10/18/2011  . Metabolic acidosis 10/16/2011  . Urinary retention 10/15/2011  .  Cellulitis of left leg 10/14/2011  . Bilateral lower extremity edema 10/14/2011  . Hypotension 10/14/2011  . Acute renal failure (HCC) 10/14/2011  . Venous stasis ulcers (HCC) 10/14/2011  . Hyponatremia 10/14/2011  . Atrial fibrillation with rapid ventricular response (HCC) 10/14/2011  . Type 2 diabetes mellitus without complication (HCC) 10/14/2011    Past Surgical History:  Procedure Laterality Date  . CYSTOSCOPY  10/18/2011   Procedure: CYSTOSCOPY FLEXIBLE;  Surgeon: Ky Barban, MD;  Location: [facility];  Service: Urology;  Laterality: N/A;  . HERNIA REPAIR     Umbilical       Home Medications    Prior to Admission medications   Medication Sig Start Date End Date Taking? Authorizing Provider  Amino Acids-Protein Hydrolys (FEEDING SUPPLEMENT, PRO-STAT SUGAR FREE 64,) LIQD Take 45 mLs by mouth 3 (three) times daily with meals. For wound healing   Yes [provider]  atorvastatin (LIPITOR) 10 MG tablet Take 10 mg by mouth daily.   Yes [provider]  dabigatran (PRADAXA) 150 MG CAPS capsule Take 1 capsule (150 mg total) by mouth every 12 (twelve) hours. 12/01/15  Yes [name], [name], MD  digoxin (LANOXIN) 0.125 MG tablet Take 1 tablet (0.125 mg total) by mouth daily. 12/28/15  Yes [name], MD  diphenoxylate-atropine (LOMOTIL) 2.5-0.025 MG tablet TAKE 1 TABLET BY MOUTH 4 TIMES A DAY FOR UP TO 10 DAYS  AS NEEDED FOR DIARRHEA 11/26/16  Yes [provider]  Ergocalciferol (VITAMIN D2) 2000 units TABS Take 1 tablet by mouth daily.   Yes [provider]  gabapentin (NEURONTIN) 300 MG capsule Take 300 mg by mouth 3 (three) times daily.   Yes [provider]  guaiFENesin (MUCINEX) 600 MG 12 hr tablet Take 600 mg by mouth 2 (two) times daily.   Yes [provider]  Insulin Degludec (TRESIBA FLEXTOUCH) 200 UNIT/ML SOPN Inject 23 Units into the skin daily at 12 noon. Give in the afternoon.   Yes [provider]    ipratropium-albuterol (DUONEB) 0.5-2.5 (3) MG/3ML SOLN Take 3 mLs by nebulization every 6 (six) hours as needed (congestion).   Yes [provider]  metFORMIN (GLUCOPHAGE) 500 MG tablet Take 1,000 mg by mouth 2 (two) times daily with a meal.    Yes [provider]  Multiple Vitamins-Minerals (CERTAGEN PO) Take 1 tablet by mouth daily.   Yes [provider]  verapamil (CALAN) 80 MG tablet Take 80 mg by mouth 3 (three) times daily.   Yes [provider]  vitamin C (ASCORBIC ACID) 500 MG tablet Take 500 mg by mouth 2 (two) times daily.   Yes [provider]  zinc sulfate 220 (50 Zn) MG capsule Take 220 mg by mouth daily.   Yes [provider]  cefTRIAXone (ROCEPHIN) 1 g injection Inject 1 g into the muscle daily. For SOB/Wheezing 12/28/16   [provider]    Family History Family History  Problem Relation Age of Onset  . Diabetes Mother   . Emphysema Father     Social History Social History  Substance Use Topics  . Smoking status: Never Smoker  . Smokeless tobacco: Never Used  . Alcohol use No     Allergies   Furosemide   Review of Systems Review of Systems  Neurological: Positive for tremors.  All other systems reviewed and are negative.    Physical Exam Updated Vital Signs BP (!) 98/53   Pulse (!) 121   Temp (!) 97.5 F (36.4 C) (Oral)   Resp (!) 22   Ht  (1.854 m)   Wt 127.9 kg (282 lb)   SpO2 97%   BMI 37.21 kg/m   Physical Exam  Constitutional: He is oriented to person, place, and time. He appears well-developed and well-nourished.  HENT:  Head: Normocephalic and atraumatic.  Right Ear: External ear normal.  Left Ear: External ear normal.  Nose: Nose normal.  Mouth/Throat: Oropharynx is clear and moist.  Eyes: Pupils are equal, round, and reactive to light. Conjunctivae and EOM are normal.  Neck: Normal range of motion. Neck supple.  Cardiovascular: Regular rhythm.  Tachycardia present.    Pulmonary/Chest: Effort normal and breath sounds normal.  Abdominal: Soft. Bowel sounds are normal.  Musculoskeletal: Normal range of motion.  Bilateral LE edema with unna boot  Neurological: He is alert and oriented to person, place, and time.  Skin: Skin is warm.  Psychiatric: He has a normal mood and affect. His behavior is normal. Judgment and thought content normal.  Nursing note and vitals reviewed.    ED Treatments / Results  Labs (all labs ordered are listed, but only abnormal results are displayed) Labs Reviewed  BASIC METABOLIC PANEL - Abnormal; Notable for the following:       Result Value   Potassium 5.7 (*)    Chloride 99 (*)    Glucose, Bld 159 (*)    BUN  49 (*)    Creatinine, Ser 2.13 (*)    GFR calc non Af Amer 30 (*)    GFR calc Af Amer 34 (*)    All other components within normal limits  CBC WITH DIFFERENTIAL/PLATELET - Abnormal; Notable for the following:    WBC 17.4 (*)    Hemoglobin 12.3 (*)    MCHC 29.1 (*)    RDW 17.5 (*)    Platelets 409 (*)    Neutro Abs 14.1 (*)    Monocytes Absolute 1.1 (*)    All other components within normal limits  AMMONIA - Abnormal; Notable for the following:    Ammonia 38 (*)    All other components within normal limits  URINALYSIS, ROUTINE W REFLEX MICROSCOPIC - Abnormal; Notable for the following:    Color, Urine AMBER (*)    APPearance CLOUDY (*)    Protein, ur 30 (*)    Bacteria, UA FEW (*)    Squamous Epithelial / LPF 0-5 (*)    All other components within normal limits  HEPATIC FUNCTION PANEL - Abnormal; Notable for the following:    Albumin 3.4 (*)    All other components within normal limits  CBG MONITORING, ED - Abnormal; Notable for the following:    Glucose-Capillary 128 (*)    All other components within normal limits  I-STAT CG4 LACTIC ACID, ED - Abnormal; Notable for the following:    Lactic Acid, Venous 3.79 (*)    All other components within normal limits  CULTURE, BLOOD (ROUTINE X 2)  CULTURE,  BLOOD (ROUTINE X 2)  DIGOXIN LEVEL  I-STAT CG4 LACTIC ACID, ED    EKG  EKG Interpretation  Date/Time:  Thursday December 28 2016 13:48:42 EDT Ventricular Rate:  128 PR Interval:    QRS Duration: 82 QT Interval:  330 QTC Calculation: 481 R Axis:   49 Text Interpretation:  Undetermined rhythm Possible Inferior infarct , age undetermined Cannot rule out Anterior infarct , age undetermined Marked ST abnormality, possible lateral subendocardial injury Abnormal ECG pt very tremulous.  poor baseline. Confirmed by Jacalyn Lefevre [phone]) on 12/28/2016 2:27:12 PM       Radiology Dg Chest 2 View  Result Date: 12/28/2016 CLINICAL DATA:  Altered mental status EXAM: CHEST  2 VIEW COMPARISON:  November 27, 2015 FINDINGS: There is airspace consolidation in the posterior right base. Lungs elsewhere clear. Heart is mildly enlarged with pulmonary vascularity within normal limits. No adenopathy. No evident bone lesions. IMPRESSION: Airspace consolidation posterior right base consistent with pneumonia. Lungs elsewhere clear. Stable cardiac prominence. Followup PA and lateral chest radiographs recommended in 3-4 weeks following trial of antibiotic therapy to ensure resolution and exclude underlying malignancy. Electronically Signed   By: Bretta Bang III M.D.   On: 12/28/2016 17:01   Ct Head Wo Contrast  Result Date: 12/28/2016 CLINICAL DATA:  Altered level of consciousness. EXAM: CT HEAD WITHOUT CONTRAST TECHNIQUE: Contiguous axial images were obtained from the base of the skull through the vertex without intravenous contrast. COMPARISON:  CT scan of June 19, 2010. FINDINGS: Brain: Mild chronic ischemic white matter disease is noted. No mass effect or midline shift is noted. Ventricular size is within normal limits. There is no evidence of mass lesion, hemorrhage or acute infarction. Vascular: No hyperdense vessel or unexpected calcification. Skull: Normal. Negative for fracture or focal lesion.  Sinuses/Orbits: No acute finding. Other: None. IMPRESSION: Mild chronic ischemic white matter disease. No acute intracranial abnormality seen. Electronically Signed   By: Fayrene Fearing  Christen Butter, M.D.   On: 12/28/2016 17:45    Procedures Procedures (including critical care time)  Medications Ordered in ED Medications  sodium chloride 0.9 % bolus 1,000 mL (0 mLs Intravenous Stopped 12/28/16 1613)    And  0.9 %  sodium chloride infusion ( Intravenous Stopped 12/28/16 1805)  sodium chloride 0.9 % bolus 1,000 mL (0 mLs Intravenous Stopped 12/28/16 1814)  cefTRIAXone (ROCEPHIN) 1 g in dextrose 5 % 50 mL IVPB (1 g Intravenous New Bag/Given 12/28/16 1839)  azithromycin (ZITHROMAX) 500 mg in dextrose 5 % 250 mL IVPB (not administered)  sodium chloride 0.9 % bolus 1,000 mL (1,000 mLs Intravenous New Bag/Given 12/28/16 1815)    And  sodium chloride 0.9 % bolus 1,000 mL (1,000 mLs Intravenous New Bag/Given 12/28/16 1815)    And  sodium chloride 0.9 % bolus 1,000 mL (not administered)    And  sodium chloride 0.9 % bolus 1,000 mL (1,000 mLs Intravenous Not Given 12/28/16 1849)  sodium chloride 0.9 % bolus 1,000 mL (0 mLs Intravenous Stopped 12/28/16 1822)     Initial Impression / Assessment and Plan / ED Course  I have reviewed the triage vital signs and the nursing notes.  Pertinent labs & imaging results that were available during my care of the patient were reviewed by me and considered in my medical decision making (see chart for details).  This patient meets SIRS Criteria and may be septic. SIRS = Systemic Inflammatory Response Syndrome  Best Practice Recommends:   Notify the nurse immediately to increase monitoring of patient.   The recent clinical data is shown below. Vitals:   12/28/16 1720 12/28/16 1730 12/28/16 1800 12/28/16 1825  BP: (!) 94/58 (!) 86/72 (!) 98/53   Pulse: 98  (!) 119 (!) 121  Resp: (!) 22  Temp:      TempSrc:      SpO2: 100%  96% 97%  Weight:      Height:            CRITICAL CARE Performed by: Jacalyn Lefevre   Total critical care time: 45  minutes  Critical care time was exclusive of separately billable procedures and treating other patients.  Critical care was necessary to treat or prevent imminent or life-threatening deterioration.  Critical care was time spent personally by me on the following activities: development of treatment plan with patient and/or surrogate as well as nursing, discussions with consultants, evaluation of patient's response to treatment, examination of patient, obtaining history from patient or surrogate, ordering and performing treatments and interventions, ordering and review of laboratory studies, ordering and review of radiographic studies, pulse oximetry and re-evaluation of patient's condition.  Kidney function improving.  He was treated appropriately for hyperkalemia prior to arrival and potassium is trending down, so I did not give him any additional meds for that.  As pt stayed in the ED, his mental status worsened.  His bp dropped and his O2 sat dropped.  The pt does have PNA on cxr, so I suspect he is going into septic shock in the ED.  Pt given 4L of fluids and bp is improving.  Pt treated with rocephin and zithromax.  Pt started on bipap.  Mental status is improving.  Pt d/w Dr. Antionette Char for admission.  Final Clinical Impressions(s) / ED Diagnoses   Final diagnoses:  Septic shock (HCC)  Pneumonia of right lower lobe due to infectious organism (HCC)  Hyperkalemia  AKI (acute kidney injury) (HCC)  Acute respiratory failure  with hypoxia [facility])    New Prescriptions New Prescriptions   No medications on file     Jacalyn Lefevre, MD 12/28/16 Amada Jupiter, MD 12/28/16 [phone]

## 2016-12-28 NOTE — Progress Notes (Signed)
CRITICAL VALUE ALERT  Critical Value:  Lactic 2.1  Date & Time Notied: 12/28/2016 2110  Provider Notified: Schorr NP

## 2016-12-28 NOTE — ED Notes (Signed)
Patient transported to CT via stretcher by April.

## 2016-12-28 NOTE — ED Notes (Addendum)
Pt comfortable  

## 2016-12-28 NOTE — Progress Notes (Addendum)
Pharmacy Antibiotic Note  Jonathan Wise is a 71 y.o. male admitted on 12/28/2016 with sepsis / PNA.  Pharmacy has been consulted for Vancomycin dosing. Acute renal failure compared to CrCl eleven months ago.  Plan: Vancomycin 2gm IV x 1 Vancomycin  IV every 24 hours.  Goal trough 15-20 mcg/mL.  Change Cefepime to 2gm IV every 24 hours for renal function. Monitor labs, micro and vitals.   Height:  (185.4 cm) Weight: 282 lb (127.9 kg) IBW/kg (Calculated) : 79.9  Temp (24hrs), Avg:97.5 F (36.4 C), Min:97.5 F (36.4 C), Max:97.5 F (36.4 C)   Recent Labs Lab 12/28/16 1406 12/28/16 1420 12/28/16 1833  WBC 17.4*  --   --   CREATININE 2.13*  --   --   LATICACIDVEN  --  3.79* 0.56    Estimated Creatinine Clearance: 44.6 mL/min (A) (by C-G formula based on SCr of 2.13 mg/dL (H)).    Allergies  Allergen Reactions  . Furosemide Other (See Comments)    Dizziness , drowsiness    Antimicrobials this admission: Vanc 9/20 >>  Cefepime 9/20 >>   Dose adjustments this admission: Obesity/Normalized CrCl Vancomycin dosing protocol will be initiated with an estimated normalized CrCl = 32 ml/min.   Microbiology results: 9/20 BCx: pending  UCx:    Sputum:    MRSA PCR:   Thank you for allowing pharmacy to be a part of this patient's care.  Mady Gemma 12/28/2016 7:12 PM

## 2016-12-29 ENCOUNTER — Inpatient Hospital Stay (HOSPITAL_COMMUNITY): Payer: Medicare Other

## 2016-12-29 DIAGNOSIS — I4891 Unspecified atrial fibrillation: Secondary | ICD-10-CM

## 2016-12-29 DIAGNOSIS — J9601 Acute respiratory failure with hypoxia: Secondary | ICD-10-CM

## 2016-12-29 DIAGNOSIS — J181 Lobar pneumonia, unspecified organism: Secondary | ICD-10-CM

## 2016-12-29 DIAGNOSIS — J189 Pneumonia, unspecified organism: Secondary | ICD-10-CM

## 2016-12-29 DIAGNOSIS — L8990 Pressure ulcer of unspecified site, unspecified stage: Secondary | ICD-10-CM | POA: Insufficient documentation

## 2016-12-29 DIAGNOSIS — A419 Sepsis, unspecified organism: Principal | ICD-10-CM

## 2016-12-29 LAB — BASIC METABOLIC PANEL
Anion gap: 9 (ref 5–15)
BUN: 42 mg/dL — AB (ref 6–20)
CHLORIDE: 104 mmol/L (ref 101–111)
CO2: 26 mmol/L (ref 22–32)
CREATININE: 1.19 mg/dL (ref 0.61–1.24)
Calcium: 8.2 mg/dL — ABNORMAL LOW (ref 8.9–10.3)
GFR calc Af Amer: >60 mL/min (ref >60)
GFR calc non Af Amer: 60 mL/min — ABNORMAL LOW (ref >60)
GLUCOSE: 81 mg/dL (ref 65–99)
POTASSIUM: 4.6 mmol/L (ref 3.5–5.1)
Sodium: 139 mmol/L (ref 135–145)

## 2016-12-29 LAB — CBC WITH DIFFERENTIAL/PLATELET
Basophils Absolute: 0 10*3/uL (ref 0.0–0.1)
Basophils Relative: 0 %
EOS ABS: 0.1 10*3/uL (ref 0.0–0.7)
Eosinophils Relative: 0 %
HEMATOCRIT: 39.8 % (ref 39.0–52.0)
HEMOGLOBIN: 11.7 g/dL — AB (ref 13.0–17.0)
LYMPHS ABS: 1.5 10*3/uL (ref 0.7–4.0)
Lymphocytes Relative: 11 %
MCH: 26.1 pg (ref 26.0–34.0)
MCHC: 29.4 g/dL — AB (ref 30.0–36.0)
MCV: 88.6 fL (ref 78.0–100.0)
MONOS PCT: 9 %
Monocytes Absolute: 1.2 10*3/uL — ABNORMAL HIGH (ref 0.1–1.0)
NEUTROS PCT: 80 %
Neutro Abs: 10.6 10*3/uL — ABNORMAL HIGH (ref 1.7–7.7)
Platelets: 303 10*3/uL (ref 150–400)
RBC: 4.49 MIL/uL (ref 4.22–5.81)
RDW: 17.4 % — ABNORMAL HIGH (ref 11.5–15.5)
WBC: 13.4 10*3/uL — AB (ref 4.0–10.5)

## 2016-12-29 LAB — GLUCOSE, CAPILLARY
GLUCOSE-CAPILLARY: 83 mg/dL (ref 65–99)
GLUCOSE-CAPILLARY: 97 mg/dL (ref 65–99)
GLUCOSE-CAPILLARY: 85 mg/dL (ref 65–99)
Glucose-Capillary: 139 mg/dL — ABNORMAL HIGH (ref 65–99)
Glucose-Capillary: 174 mg/dL — ABNORMAL HIGH (ref 65–99)
Glucose-Capillary: 137 mg/dL — ABNORMAL HIGH (ref 65–99)

## 2016-12-29 LAB — OCCULT BLOOD X 1 CARD TO LAB, STOOL: Fecal Occult Bld: POSITIVE — AB

## 2016-12-29 LAB — STREP PNEUMONIAE URINARY ANTIGEN: Strep Pneumo Urinary Antigen: NEGATIVE

## 2016-12-29 LAB — POTASSIUM
POTASSIUM: 4.4 mmol/L (ref 3.5–5.1)
POTASSIUM: 4.7 mmol/L (ref 3.5–5.1)
Potassium: 4.3 mmol/L (ref 3.5–5.1)

## 2016-12-29 LAB — TSH: TSH: 1.853 u[IU]/mL (ref 0.350–4.500)

## 2016-12-29 LAB — VITAMIN B12: Vitamin B-12: 349 pg/mL (ref 180–914)

## 2016-12-29 MED ORDER — DEXTROSE 5 % IV SOLN
2.0000 g | Freq: Three times a day (TID) | INTRAVENOUS | Status: DC
  Administered 2016-12-29 – 2016-12-31 (×8): 2 g via INTRAVENOUS
  Filled 2016-12-29 (×10): qty 2

## 2016-12-29 MED ORDER — SILVER SULFADIAZINE 1 % EX CREA
TOPICAL_CREAM | Freq: Every day | CUTANEOUS | Status: DC
  Administered 2016-12-29: 12:00:00 via TOPICAL
  Filled 2016-12-29: qty 85

## 2016-12-29 MED ORDER — VANCOMYCIN HCL IN DEXTROSE 1-5 GM/200ML-% IV SOLN
1000.0000 mg | Freq: Two times a day (BID) | INTRAVENOUS | Status: DC
  Administered 2016-12-29 – 2016-12-31 (×5): 1000 mg via INTRAVENOUS
  Filled 2016-12-29 (×6): qty 200

## 2016-12-29 MED ORDER — SODIUM CHLORIDE 0.9 % IV SOLN
INTRAVENOUS | Status: AC
Start: 2016-12-29 — End: 2016-12-30
  Administered 2016-12-29 – 2016-12-30 (×3): via INTRAVENOUS

## 2016-12-29 MED ORDER — METOPROLOL TARTRATE 5 MG/5ML IV SOLN
5.0000 mg | INTRAVENOUS | Status: DC | PRN
  Filled 2016-12-29: qty 5

## 2016-12-29 NOTE — Clinical Social Work Note (Signed)
Clinical Social Work Assessment  Patient Details  Name: Jonathan Wise MRN: [id number] Date of Birth: 1945/07/07  Date of referral:  12/29/16               Reason for consult:  Discharge Planning                Permission sought to share information with:    Permission granted to share information::     Name::        Agency::  Adelene [facility]as at [facility]   Relationship::     Contact Information:     Housing/Transportation Living arrangements for the past 2 months:  Single Family Home Source of Information:  Patient Patient Interpreter Needed:  None Criminal Activity/Legal Involvement Pertinent to Current Situation/Hospitalization:  No - Comment as needed Significant Relationships:  Spouse Lives with:  Spouse, Facility Resident Do you feel safe going back to the place where you live?  Yes Need for family participation in patient care:  Yes (Comment)  Care giving concerns:  None identified   Social Worker assessment / plan:  Patient has been at [facility] since 12/06/16. Patient came to the facility from home with his wife for short term rehab.  He will return home at the completion of rehab.  Patient is alert and oriented, feeds himself, receives assistance with ADLs from staff and has a supportive wife. Patient can return to [facility] at discharge. Patient is in coinsurance days.   Employment status:  Retired Database administrator PT Recommendations:  Not assessed at this time Information / Referral to community resources:     Patient/Family's Response to care:  Patient chooses to be at [facility] for short term rehab.   Patient/Family's Understanding of and Emotional Response to Diagnosis, Current Treatment, and Prognosis:  Patient and family understand patient's diagnosis, treatment and prognosis.   Emotional Assessment Appearance:  Appears stated age Attitude/Demeanor/Rapport:    Affect (typically observed):  Unable to Assess Orientation:   Oriented to Situation, Oriented to  Time, Oriented to Place, Oriented to Self Alcohol / Substance use:  Not Applicable Psych involvement (Current and /or in the community):  No (Comment)  Discharge Needs  Concerns to be addressed:  Discharge Planning Concerns Readmission within the last 30 days:  No Current discharge risk:  None Barriers to Discharge:  No Barriers Identified   Annice Needy, LCSW 12/29/2016, 4:28 PM

## 2016-12-29 NOTE — Progress Notes (Addendum)
PROGRESS NOTE                                                                                                                                                                                                             Patient Demographics:    Jonathan Wise, is a 71 y.o. male, DOB - November 02, 1945, QQI:[id number]  Admit date - 12/28/2016   Admitting Physician Vianne Bulls, MD  Outpatient Primary MD for the patient is Christain Sacramento, MD  LOS - 1  Outpatient Specialists: none  No chief complaint on file.      Brief Narrative   71 year old obese male with A. fib on Pradaxa, insulin-dependent diabetes mellitus, hypertension and venous stasis ulcer sent to the ED from SNF for hyperkalemia. Patient was reportedly started on Rocephin at the facility for suspected pneumonia. On the day of admission patient was found to be in some distress, blood work done showed potassium of 7.4. He was given Kayexalate, dextrose and insulin, albuterol and sent to the ED for further evaluation. Patient has been receiving wound care at the nursing facility for bilateral venous stasis ulcer. Patient informs that he was in the hospital recently but I do not see any records. In the ED he was afebrile, tachycardic, tachypneic and hypotensive . He received 2 L IV normal saline bolus with improvement. EKG with inferolateral ST changes. Head CT was negative for acute intracranial abnormality. Chest x-ray showed right basilar consolidation suggestive of pneumonia. Patient met criteria for sepsis with tachycardia, tachypnea, WBC of 17.4 K and elevated lactic acid of 3.7. Also had acute kidney injury. Blood culture sent in the ED and given IV fluid bolus per sepsis pathway. Patient was also placed on BiPAP and started on empiric Rocephin and azithromycin. He remained tachycardic in the ED so was admitted to stepdown unit for further management of sepsis secondary to  pneumonia and hyperkalemia.   Subjective:    Patient reports feeling weak and also reports noticing fine hand tremors for past few days. Denies any breathing difficulty at present.   Assessment  & Plan :    Principal Problem:   Septic shock  Lobar pneumonia ([place]) Right lung base consolidation on chest x-ray. GERD culture sent from ED. Continue empiric vancomycin and cefepime for healthcare associated pneumonia. Still tachycardic to 120s. continue maintenance IV fluid. O2  sat currently maintained on 2 L via nasal cannula.  WBC and lactic acid has improved. Follow cultures urine for strep antigen and Legionella.  Active Problems: Hyperkalemia Potassium of 7.4 at the facility which was treated. Possibly contributed by acute kidney injury. Now normalized.     Acute kidney injury ([place]) Suspected due to sepsis. Resolved with fluid resuscitation in the ED.    Atrial fibrillation with RVR ([facility]) Likely triggered by sepsis. Continue verapamil, Pradaxa and digoxin ( level therapeutic). Will place on when necessary IV Lopressor.     Acute respiratory failure with hypoxia ([facility]) Secondary to sepsis with pneumonia. Required BiPAP in the ED. Currently maintaining sats on 2 L via nasal cannula. Wean as tolerated.    Type 2 diabetes mellitus without  complication (HCC) O1H of 6.5 in March 2018. On Tresiba and forming as outpatient which is held. Placed on Lantus daily with sliding scale coverage.   Chronic venous stasis ulcer Wound care consult appreciated  Generalized weakness and? New onset tremors PT evaluation. Mildly elevated ammonia. Check TSH and B12.  Code Status : Full code  Family Communication  : None at bedside  Disposition Plan  : Continue step down monitoring.  Barriers For Discharge : Active symptoms  Consults  :  Wound care  Procedures  : Head CT  DVT Prophylaxis  :  Pradaxa  Lab Results  Component Value Date   PLT 303 12/29/2016    Antibiotics  :     Anti-infectives    Start     Dose/Rate Route Frequency Ordered Stop   12/29/16 2130  vancomycin (VANCOCIN) 1,500 mg in sodium chloride 0.9 % 500 mL IVPB     1,500 mg 250 mL/hr over 120 Minutes Intravenous Every 24 hours 12/28/16 1918     12/28/16 2200  ceFEPIme (MAXIPIME) 1 g in dextrose 5 % 50 mL IVPB  Status:  Discontinued     1 g 100 mL/hr over 30 Minutes Intravenous Every 8 hours 12/28/16 1906 12/28/16 1926   12/28/16 2130  vancomycin (VANCOCIN) IVPB 1000 mg/200 mL premix     1,000 mg 200 mL/hr over 60 Minutes Intravenous  Once 12/28/16 1918 12/28/16 2239   12/28/16 2000  ceFEPIme (MAXIPIME) 2 g in dextrose 5 % 50 mL IVPB     2 g 100 mL/hr over 30 Minutes Intravenous Every 24 hours 12/28/16 1926     12/28/16 1930  vancomycin (VANCOCIN) IVPB 1000 mg/200 mL premix     1,000 mg 200 mL/hr over 60 Minutes Intravenous  Once 12/28/16 1918 12/28/16 2043   12/28/16 1800  cefTRIAXone (ROCEPHIN) 1 g in dextrose 5 % 50 mL IVPB     1 g 100 mL/hr over 30 Minutes Intravenous  Once 12/28/16 1757 12/28/16 1909   12/28/16 1800  azithromycin (ZITHROMAX) 500 mg in dextrose 5 % 250 mL IVPB  Status:  Discontinued     500 mg 250 mL/hr over 60 Minutes Intravenous  Once 12/28/16 1757 12/28/16 1906        Objective:   Vitals:   12/29/16 0800 12/29/16 0804 12/29/16 0900 12/29/16 1000  BP: 122/68  115/82 (!) 113/92  Pulse: (!) 121 (!) 130 (!) 109 (!) 130  Resp: _0 Temp:      TempSrc:      SpO2: (!) 85% 98% 98% 98%  Weight:      Height:        Wt Readings from Last 3 Encounters:  12/29/16 134.7 kg (296  lb 15.4 oz)  12/25/15 (!) 167 kg (368 lb 2.7 oz)  12/01/15 (!) 175.6 kg (387 lb 2 oz)     Intake/Output Summary (Last 24 hours) at 12/29/16 1035 Last data filed at 12/29/16 0536  Gross per 24 hour  Intake             4080 ml  Output              700 ml  Net             3380 ml     Physical Exam  Gen: not in distress, Fatigued HEENT: no pallor, moist mucosa, supple  neck Chest: Diminished breath sounds over lung bases, no crackles, rhonchi or wheeze CVS: S1 and S2 irregularly irregular, no murmurs rubs or gallop GI: soft, NT, ND, BS+ Musculoskeletal: warm, Ace wrap over bilateral legs CNS: AAOX2-3, fine intentional tremors    Data Review:    CBC  Recent Labs Lab 12/28/16 1406 12/29/16 0540  WBC 17.4* 13.4*  HGB 12.3* 11.7*  HCT 42.3 39.8  PLT 409* 303  MCV 89.6 88.6  MCH 26.1 26.1  MCHC 29.1* 29.4*  RDW 17.5* 17.4*  LYMPHSABS 2.2 1.5  MONOABS 1.1* 1.2*  EOSABS 0.0 0.1  BASOSABS 0.0 0.0    Chemistries   Recent Labs Lab 12/28/16 1406 12/28/16 2106 12/29/16 0131 12/29/16 0540  NA 140  --   --  139  K 5.7* 4.7 4.4 4.6  CL 99*  --   --  104  CO2 27  --   --  26  GLUCOSE 159*  --   --  81  BUN 49*  --   --  42*  CREATININE 2.13*  --   --  1.19  CALCIUM 9.2  --   --  8.2*  AST 32  --   --   --   ALT 26  --   --   --   ALKPHOS 80  --   --   --   BILITOT 0.7  --   --   --    ------------------------------------------------------------------------------------------------------------------ No results for input(s): CHOL, HDL, LDLCALC, TRIG, CHOLHDL, LDLDIRECT in the last 72 hours.  Lab Results  Component Value Date   HGBA1C 5.7 (H) 11/29/2015   ------------------------------------------------------------------------------------------------------------------ No results for input(s): TSH, T4TOTAL, T3FREE, THYROIDAB in the last 72 hours.  Invalid input(s): FREET3 ------------------------------------------------------------------------------------------------------------------ No results for input(s): VITAMINB12, FOLATE, FERRITIN, TIBC, IRON, RETICCTPCT in the last 72 hours.  Coagulation profile No results for input(s): INR, PROTIME in the last 168 hours.  No results for input(s): DDIMER in the last 72 hours.  Cardiac Enzymes No results for input(s): CKMB, TROPONINI, MYOGLOBIN in the last 168 hours.  Invalid input(s):  CK ------------------------------------------------------------------------------------------------------------------ No results found for: BNP  Inpatient Medications  Scheduled Meds: . atorvastatin  10 mg Oral q1800  . chlorhexidine  15 mL Mouth Rinse BID  . cholecalciferol  2,000 Units Oral Daily  . dabigatran  150 mg Oral Q12H  . digoxin  0.125 mg Oral Daily  . feeding supplement (PRO-STAT SUGAR FREE 64)  45 mL Oral TID WC  . gabapentin  300 mg Oral TID  . guaiFENesin  600 mg Oral BID  . Influenza vac split quadrivalent PF  0.5 mL Intramuscular Tomorrow-1000  . insulin aspart  0-9 Units Subcutaneous Q4H  . insulin glargine  15 Units Subcutaneous QHS  . mouth rinse  15 mL Mouth Rinse q12n4p  . silver sulfADIAZINE   Topical  Daily  . verapamil  80 mg Oral Q8H  . vitamin C  500 mg Oral BID  . zinc sulfate  220 mg Oral Daily   Continuous Infusions: . ceFEPime (MAXIPIME) IV Stopped (12/28/16 2014)  . sodium chloride    . vancomycin     PRN Meds:.hydrALAZINE, ipratropium-albuterol, metoprolol tartrate  Micro Results Recent Results (from the past 240 hour(s))  Culture, blood (routine x 2)     Status: None (Preliminary result)   Collection Time: 12/28/16  6:20 PM  Result Value Ref Range Status   Specimen Description BLOOD RIGHT FOREARM  Final   Special Requests   Final    BOTTLES DRAWN AEROBIC ONLY Blood Culture results may not be optimal due to an inadequate volume of blood received in culture bottles   Culture NO GROWTH < 12 HOURS  Final   Report Status PENDING  Incomplete  Culture, blood (routine x 2)     Status: None (Preliminary result)   Collection Time: 12/28/16  6:26 PM  Result Value Ref Range Status   Specimen Description BLOOD RIGHT WRIST  Final   Special Requests   Final    BOTTLES DRAWN AEROBIC ONLY Blood Culture adequate volume   Culture NO GROWTH < 12 HOURS  Final   Report Status PENDING  Incomplete  MRSA PCR Screening     Status: Abnormal   Collection Time:  12/28/16  8:31 PM  Result Value Ref Range Status   MRSA by PCR POSITIVE (A) NEGATIVE Final    Comment: RESULT CALLED TO, READ BACK BY AND VERIFIED WITH: HEARN,J _0  BY MATTHEWS,B 9.20.18     Radiology Reports Dg Chest 2 View  Result Date: 12/28/2016 CLINICAL DATA:  Altered mental status EXAM: CHEST  2 VIEW COMPARISON:  November 27, 2015 FINDINGS: There is airspace consolidation in the posterior right base. Lungs elsewhere clear. Heart is mildly enlarged with pulmonary vascularity within normal limits. No adenopathy. No evident bone lesions. IMPRESSION: Airspace consolidation posterior right base consistent with pneumonia. Lungs elsewhere clear. Stable cardiac prominence. Followup PA and lateral chest radiographs recommended in 3-4 weeks following trial of antibiotic therapy to ensure resolution and exclude underlying malignancy. Electronically Signed   By: Lowella Grip III M.D.   On: 12/28/2016 17:01   Ct Head Wo Contrast  Result Date: 12/28/2016 CLINICAL DATA:  Altered level of consciousness. EXAM: CT HEAD WITHOUT CONTRAST TECHNIQUE: Contiguous axial images were obtained from the base of the skull through the vertex without intravenous contrast. COMPARISON:  CT scan of June 19, 2010. FINDINGS: Brain: Mild chronic ischemic white matter disease is noted. No mass effect or midline shift is noted. Ventricular size is within normal limits. There is no evidence of mass lesion, hemorrhage or acute infarction. Vascular: No hyperdense vessel or unexpected calcification. Skull: Normal. Negative for fracture or focal lesion. Sinuses/Orbits: No acute finding. Other: None. IMPRESSION: Mild chronic ischemic white matter disease. No acute intracranial abnormality seen. Electronically Signed   By: Marijo Conception, M.D.   On: 12/28/2016 17:45    Time Spent in minutes  35   Louellen Molder M.D on 12/29/2016 at 10:35 AM  Between 7am to 7pm - Pager - [phone]  After 7pm go to [email or web address] - password  [facility]  [facility] -  Office  [phone]

## 2016-12-29 NOTE — Care Management Note (Signed)
Case Management Note  Patient Details  Name: NOLLAN MULDROW MRN: [id number] Date of Birth: Feb 07, 1946  Subjective/Objective:    Adm from [facility] with sepsis due to pneumonia. Requiring BiPAP.  Has venous stasis ulcer, WOC consulted.               Action/Plan: Anticipate DC back to SNF. CSW consulted and will make arrangements for return.  Expected Discharge Date:       12/31/2016           Expected Discharge Plan:  Skilled Nursing Facility  In-House Referral:  Clinical Social Work  Discharge planning Services  CM Consult  Post Acute Care Choice:  NA Choice offered to:  NA  DME Arranged:    DME Agency:     HH Arranged:    HH Agency:     Status of Service:  Completed, signed off  If discussed at [employer], dates discussed:    Additional Comments:  Maisy Newport, Chrystine Oiler, RN 12/29/2016, 12:17 PM

## 2016-12-29 NOTE — Progress Notes (Signed)
When patient arrived to unit he had a bowel movement. RN and NT noted patient stool seemed to appear bloody looking. Hemoccult tests ordered. Came back positive. Hgb Stable Will continue to monitor  Genelle Bal, RN

## 2016-12-29 NOTE — Progress Notes (Signed)
Pharmacy Antibiotic Note  Jonathan Wise is a 71 y.o. male admitted on 12/28/2016 with sepsis / PNA.  Pharmacy has been consulted for Vancomycin dosing. SrCr has improved  Plan: Change vanc to 1gm IV q12 hours  Change Cefepime to 2gm IV every 8 hours Monitor labs, micro and vitals.   Height: 6' (182.9 cm) Weight: 296 lb 15.4 oz (134.7 kg) IBW/kg (Calculated) : 77.6  Temp (24hrs), Avg:97.8 F (36.6 C), Min:97.5 F (36.4 C), Max:98.4 F (36.9 C)   Recent Labs Lab 12/28/16 1406 12/28/16 1420 12/28/16 1833 12/28/16 2106 12/29/16 0540  WBC 17.4*  --   --   --  13.4*  CREATININE 2.13*  --   --   --  1.19  LATICACIDVEN  --  3.79* 0.56 2.1*  --     Estimated Creatinine Clearance: 80.9 mL/min (by C-G formula based on SCr of 1.19 mg/dL).    Allergies  Allergen Reactions  . Furosemide Other (See Comments)    Dizziness , drowsiness    Antimicrobials this admission: Vanc 9/20 >>  Cefepime 9/20 >>   Microbiology results: 9/20 BCx: pending  UCx:    Sputum:    MRSA PCR:(+)   Thank you for allowing pharmacy to be a part of this patient's care.  Talbert Cage P[place]et 12/29/2016 10:54 AM

## 2016-12-29 NOTE — Evaluation (Signed)
Physical Therapy Evaluation Patient Details Name: Jonathan Wise MRN: [id number] DOB: 1946/03/14 Today's Date: 12/29/2016   History of Present Illness  BRINSON TOZZI is a 71 y.o. male with medical history significant for atrial fibrillation on Peridex, insulin-dependent diabetes mellitus, obesity, and hypertension, and into the emergency department from his SNF for evaluation of hyperkalemia. Patient has reportedly just been started on Rocephin at the nursing facility for suspected pneumonia and was in acute distress today. Blood work was obtained at the nursing facility and notable for a potassium of 7.4. Patient was treated with 30 g Kayexalate, dextrose with insulin, albuterol, and continued on his Rocephin. He was sent to the ED for further evaluation and management. Patient denied any specific complaints. He reports feeling unwell. He has been receiving wound care at the nursing facility for bilateral lower extremity ulcers attributed to chronic venous stasis.    Clinical Impression  Patient unable to attempt sit to stands or transfers secondary to c/o BLE weakness, demonstrated good return for use of BUE for scooting self forward and laterally in bed, patient c/o lack of feeling BLE from knees down which has been baseline for nearly a year.  Patient will benefit from continued physical therapy in hospital and recommended venue below to increase strength, balance, endurance for safe ADLs and gait.    Follow Up Recommendations SNF;Supervision/Assistance - 24 hour    Equipment Recommendations       Recommendations for Other Services       Precautions / Restrictions Precautions Precautions: Fall Restrictions Weight Bearing Restrictions: No      Mobility  Bed Mobility Overal bed mobility: Needs Assistance Bed Mobility: Supine to Sit;Sit to Supine;Rolling Rolling: Min guard   Supine to sit: Mod assist;Max assist Sit to supine: Mod assist;Max assist   General bed mobility  comments: Mod/max assist scooting laterally at bedside  Transfers                    Ambulation/Gait                Stairs            Wheelchair Mobility    Modified Rankin (Stroke Patients Only)       Balance Overall balance assessment: Needs assistance Sitting-balance support: No upper extremity supported;Feet supported Sitting balance-Leahy Scale: Fair                                       Pertinent Vitals/Pain Pain Assessment: No/denies pain    Home Living Family/patient expects to be discharged to:: Private residence Living Arrangements: Spouse/significant other Available Help at Discharge: Family Type of Home: House Home Access: Ramped entrance     Home Layout: One level Home Equipment: Environmental consultant - 2 wheels;Shower seat;Bedside commode;Cane - single point      Prior Function Level of Independence: Independent with assistive device(s)         Comments: since at SNF nonambulatory for nearly year     Hand Dominance        Extremity/Trunk Assessment   Upper Extremity Assessment Upper Extremity Assessment: Generalized weakness    Lower Extremity Assessment Lower Extremity Assessment: RLE deficits/detail;LLE deficits/detail RLE Deficits / Details: grossly 2+/5 LLE Deficits / Details: grossly 2+/5       Communication   Communication: No difficulties  Cognition Arousal/Alertness: Awake/alert Behavior During Therapy: WFL for tasks assessed/performed Overall Cognitive Status: Within  Functional Limits for tasks assessed                                        General Comments      Exercises     Assessment/Plan    PT Assessment Patient needs continued PT services  PT Problem List Decreased strength;Decreased activity tolerance;Decreased balance;Decreased mobility       PT Treatment Interventions Functional mobility training;Therapeutic activities;Therapeutic exercise;Balance training;Gait  training;Wheelchair mobility training    PT Goals (Current goals can be found in the Care Plan section)  Acute Rehab PT Goals Patient Stated Goal: Return home after rehab PT Goal Formulation: With patient Time For Goal Achievement: 01/12/17 Potential to Achieve Goals: Fair    Frequency Min 3X/week   Barriers to discharge        Co-evaluation               AM-PAC PT "6 Clicks" Daily Activity  Outcome Measure Difficulty turning over in bed (including adjusting bedclothes, sheets and blankets)?: Unable Difficulty moving from lying on back to sitting on the side of the bed? : Unable Difficulty sitting down on and standing up from a chair with arms (e.g., wheelchair, bedside commode, etc,.)?: Unable Help needed moving to and from a bed to chair (including a wheelchair)?: Total Help needed walking in hospital room?: Total Help needed climbing 3-5 steps with a railing? : Total 6 Click Score: 6    End of Session Equipment Utilized During Treatment: Oxygen Activity Tolerance: Patient limited by fatigue Patient left: in bed;with call bell/phone within reach Nurse Communication: Mobility status PT Visit Diagnosis: Unsteadiness on feet (R26.81);Muscle weakness (generalized) (M62.81);Other abnormalities of gait and mobility (R26.89)    Time: 1335-1411 PT Time Calculation (min) (ACUTE ONLY): 36 min   Charges:     PT Treatments $Therapeutic Activity: 23-37 mins   PT G Codes:        4:08 PM, 01-04-2017 Ocie Bob, MPT Physical Therapist with [facility] [phone] office [phone] mobile phone

## 2016-12-29 NOTE — Progress Notes (Signed)
Increase Bipap rate and Fio2 due to occasional O2 saturation drops. Pt appears to have periods of apnea also. RN and Rt will continue to monitor pt.

## 2016-12-29 NOTE — Consult Note (Addendum)
WOC Nurse wound consult note Reason for Consult: Consult requested for bilat legs.  Pt states he has been using Silvadene and kerlex and coban prior to admission, but his wife has Una boots at home which he prefers.  He has an allergy to the [facility] boot brand carried at [facility] formulary and does not use the zinc based wraps.  He states his wife can bring in his Una boots from home and will be able to apply them. He agrees to use the previous plan of care today with Silvadene and light compression.  Wound type:Bilat legs with dry patchy peeling skin and partial thickness skin loss where skin has dried and crusted and peeled.  Dark red wound beds, minimal amt drainage, no odor.  Anterior bilat calves affected.   Bilat buttocks and inner abd skin folds with red macerated partial thickness skin loss; there are several pink moist fissures and buttocks/sacrum area with affected area of skin loss 4X2cm; appearance consistent with moisture associated skin damage and was present on admission.  Pt is frequently incontinent of stool and this is becoming trapped beneath the foam dressing. Dressing procedure/placement/frequency: Applied Silvadene/abd pads/kerlex/ace wraps to bilat legs.  Dressing change will be due on Tues, according to patient.  If wife desires to bring in his Una boots from home and apply them, this is fine and will need to be changed on Tuesday.  Barrier cream to protect buttocks and repel moisture.  Discussed plan of care with patient and he verbalized understanding. One hour spent performing this consult. Please re-consult if further assistance is needed.  Thank-you,  Cammie Mcgee MSN, RN, CWOCN, [place], CNS [phone]

## 2016-12-29 NOTE — Care Management Important Message (Signed)
Important Message  Patient Details  Name: Jonathan Wise MRN: [id number] Date of Birth: [birth year]   Medicare Important Message Given:  Yes    Lj Miyamoto, Chrystine Oiler, RN 12/29/2016, 12:19 PM

## 2016-12-30 LAB — BASIC METABOLIC PANEL
ANION GAP: 7 (ref 5–15)
BUN: 28 mg/dL — ABNORMAL HIGH (ref 6–20)
CHLORIDE: 102 mmol/L (ref 101–111)
CO2: 32 mmol/L (ref 22–32)
Calcium: 8.8 mg/dL — ABNORMAL LOW (ref 8.9–10.3)
Creatinine, Ser: 0.57 mg/dL — ABNORMAL LOW (ref 0.61–1.24)
GFR calc Af Amer: >60 mL/min (ref >60)
GFR calc non Af Amer: >60 mL/min (ref >60)
GLUCOSE: 151 mg/dL — AB (ref 65–99)
POTASSIUM: 4.4 mmol/L (ref 3.5–5.1)
Sodium: 141 mmol/L (ref 135–145)

## 2016-12-30 LAB — POTASSIUM
POTASSIUM: 4.8 mmol/L (ref 3.5–5.1)
Potassium: 4.4 mmol/L (ref 3.5–5.1)
Potassium: 4.6 mmol/L (ref 3.5–5.1)

## 2016-12-30 LAB — GLUCOSE, CAPILLARY
GLUCOSE-CAPILLARY: 88 mg/dL (ref 65–99)
GLUCOSE-CAPILLARY: 101 mg/dL — AB (ref 65–99)
GLUCOSE-CAPILLARY: 137 mg/dL — AB (ref 65–99)
Glucose-Capillary: 151 mg/dL — ABNORMAL HIGH (ref 65–99)
Glucose-Capillary: 107 mg/dL — ABNORMAL HIGH (ref 65–99)

## 2016-12-30 LAB — LEGIONELLA PNEUMOPHILA SEROGP 1 UR AG: L. PNEUMOPHILA SEROGP 1 UR AG: NEGATIVE

## 2016-12-30 MED ORDER — CHLORHEXIDINE GLUCONATE CLOTH 2 % EX PADS
6.0000 | MEDICATED_PAD | Freq: Every day | CUTANEOUS | Status: DC
  Administered 2016-12-30 – 2016-12-31 (×2): 6 via TOPICAL

## 2016-12-30 MED ORDER — INSULIN ASPART 100 UNIT/ML ~~LOC~~ SOLN
0.0000 [IU] | Freq: Three times a day (TID) | SUBCUTANEOUS | Status: DC
  Administered 2016-12-30: 2 [IU] via SUBCUTANEOUS
  Administered 2016-12-31: 1 [IU] via SUBCUTANEOUS

## 2016-12-30 MED ORDER — SODIUM CHLORIDE 0.9% FLUSH
3.0000 mL | Freq: Two times a day (BID) | INTRAVENOUS | Status: DC
  Administered 2016-12-30 – 2016-12-31 (×3): 3 mL via INTRAVENOUS

## 2016-12-30 MED ORDER — MUPIROCIN 2 % EX OINT
1.0000 "application " | TOPICAL_OINTMENT | Freq: Two times a day (BID) | CUTANEOUS | Status: DC
  Administered 2016-12-30 – 2017-01-01 (×6): 1 via NASAL
  Filled 2016-12-30: qty 44

## 2016-12-30 MED ORDER — INSULIN GLARGINE 100 UNIT/ML ~~LOC~~ SOLN
10.0000 [IU] | Freq: Every day | SUBCUTANEOUS | Status: DC
  Administered 2016-12-30 – 2016-12-31 (×2): 10 [IU] via SUBCUTANEOUS
  Filled 2016-12-30 (×3): qty 0.1

## 2016-12-30 MED ORDER — DIPHENOXYLATE-ATROPINE 2.5-0.025 MG PO TABS: 1.0000 | ORAL_TABLET | Freq: Two times a day (BID) | ORAL | Status: DC | PRN

## 2016-12-30 MED ORDER — INSULIN ASPART 100 UNIT/ML ~~LOC~~ SOLN: 0.0000 [IU] | Freq: Every day | SUBCUTANEOUS | Status: DC

## 2016-12-30 NOTE — Progress Notes (Signed)
Patient was placed back on his BIPAP last night due to desaturation while he was sleeping. Patient O2 decreases to 70's 80's while sleeping. When you awaken patient, O2 sats increase to the 90's.

## 2016-12-30 NOTE — Progress Notes (Signed)
**Note De-identified Byran Bilotti Obfuscation** Patient removed from BIPAP and placed on 2 L [place]; tolerating well.  RRT to continue to monitor 

## 2016-12-30 NOTE — Progress Notes (Signed)
PROGRESS NOTE    Jonathan Wise  DTO:[id number] DOB: 1945-06-13 DOA: 12/28/2016 PCP: Christain Sacramento, MD    Brief Narrative:  Patient is a 71 year old obese male with A. fib on Pradaxa, IDDM, HTN and chronic venous stasis ulcers was admitted from the SNF for hyperkalemia. Patient was reportedly started on Rocephin at the facility for suspected pneumonia. On the day of admission patient was found to be in some respiratory distress. Blood work at the SNF revealed a potassium of 7.4.. He was given Kayexalate, dextrose and insulin, albuterol and sent to the ED for further evaluation. Patient had been receiving wound care at the nursing facility for bilateral venous stasis ulcer.  In the ED he was afebrile, tachycardic, tachypneic and hypotensive . He received 2 L IV normal saline bolus with improvement. EKG revealed inferolateral ST changes. Head CT was negative for acute intracranial abnormality. Chest x-ray showed right basilar consolidation suggestive of pneumonia. Patient met the criteria for sepsis with tachycardia, tachypnea, WBC of 17.4 K and elevated lactic acid of 3.7. He also had acute kidney injury.    Assessment & Plan:   Principal Problem:   Sepsis due to pneumonia [facility]) Active Problems:   Hyperkalemia   Acute respiratory failure with hypoxia (HCC)   Type 2 diabetes mellitus without complication (HCC)   Current use of long term anticoagulation   AKI (acute kidney injury) ([place])   Venous stasis ulcers (HCC)   Morbid obesity (HCC)   Atrial fibrillation (HCC)   Anemia of chronic disease    1. Sepsis secondary to HCAP. Patient was given Rocephin and azithromycin and a 2 L bolus of IV fluids in the ED. Blood cultures were ordered. He was subsequently started on cefepime and vancomycin. -His lactic acid level was elevated on admission and now has improved. -His W BC was 17.4 on admission and has improved. Will check a follow-up CBC 9/23. -His blood pressure and tachycardia has  improved. -Blood cultures are negative to date. -Strep pneumo antigen negative. Sputum culture and Legionella antigen pending. -Continue current management.  Acute respiratory failure with hypoxia. Patient and patient is noted to desaturate to the 70s to 80s, particularly while asleep. He probably has undiagnosed sleep apnea versus obesity hypoventilation syndrome in the setting of pneumonia.. Past BiPAP as ordered when necessary and at bedtime. Oxygen added and titrated to keep his oxygen saturations greater than 90%. -Would recommend outpatient sleep study and evaluation for CPAP treatment. -We'll continue to monitor and titrate oxygen as needed.  Hyperkalemia. Patient's serum potassium was 7.4 at the SNF, but 5.7 in the ED on admission. He had been treated appropriately at the SNF prior to admission. -His serum potassium has normalized. -The likely etiology is acute kidney injury and sepsis.  Acute kidney injury. Patient's creatinine was 2.13 on admission with a normal baseline. -He was volume repleted with IV fluids -Renal ultrasound revealed a "normal bladder" and right renal cyst at 3.4 cm. -His renal function has improved.  Chronic atrial fibrillation with RVR. Patient is treated chronically with verapamil, digoxin and Pradaxa. His heart rate was in the 110's on admission, which was likely due to volume depletion and sepsis. His digoxin level was therapeutic. -Following volume resuscitation, his heart rate has improved. -His TSH was within normal limits. -We'll continue verapamil and Pradaxa.  Chronic venous stasis ulceration/edema of lower extremities. Patient has a long history of bilateral chronic venous stasis changes and ulcerations; treated chronically at the SNF with Unna boots. -Wound care nurse  consulted. Recommendations for Unna boots from home recommended. Dressing/Unna boots are currently applied and will be changed every Tuesday.  Pressure injury on  buttocks. -Barrier cream to protect her buttocks and repel moisture ordered.  Type 2 diabetes. Patient is treated chronically with [place] and metformin. His hemoglobin A1c was 6.12 June 2016. Past medications above placed on hold. Patient was started on Lantus and sliding-scale NovoLog. -His CBGs have been on the lower side of normal, so will decrease the Lantus from 15 units to 10.  Query diarrhea, ? chronic. Patient reports chronic loose stools. It does not appear to be infectious, but will order a GI panel to be on the safe side. Patient reported not having a colonoscopy ever. He was advised to have an elective colonoscopy for definitive evaluation and for colon cancer screening. The patient did not appear to be receptive to this advice.  -Will order Lomotil as needed pending GI panel evaluation.      DVT prophylaxis: Pradaxa Code Status: Full code Family Communication: Family not available Disposition Plan: Discharge home versus back to SNF depending on hospital course.   Consultants:   None  Procedures:   BiPAP as needed  Antimicrobials:   Cefepime 9/20>>  Vancomycin 9/20>>   Subjective: Patient says he is breathing somewhat better. He denies chest pain. Has a nonproductive cough. He reports chronic loose stools, like "my whole is constantly open". He denies bright red blood per rectum but occasionally has some dark brown or black stools.  Objective: Vitals:   12/30/16 0500 12/30/16 0600 12/30/16 0700 12/30/16 0800  BP: 132/74 129/82 (!) 102/56 125/83  Pulse: 99 (!) 102 85 100  Resp: 18 19 17 18   Temp:      TempSrc:      SpO2: 97% 100% 99% 99%  Weight:  133.8 kg (294 lb 15.6 oz)    Height:        Intake/Output Summary (Last 24 hours) at 12/30/16 0813 Last data filed at 12/30/16 0800  Gross per 24 hour  Intake          2331.67 ml  Output             2310 ml  Net            21.67 ml   Filed Weights   12/28/16 2100 12/29/16 0500 12/30/16 0600  Weight:  133 kg (293 lb 3.4 oz) 134.7 kg (296 lb 15.4 oz) 133.8 kg (294 lb 15.6 oz)    Examination:  General exam: Appears calm and comfortable  Respiratory system: A few scattered crackles. Respiratory effort normal. Cardiovascular system: Irregular, irregular. Trace pedal edema bilaterally. Gastrointestinal system: Abdomen is obese, nondistended, soft and nontender. No organomegaly or masses felt. Normal bowel sounds heard. Central nervous system: Alert and oriented. No focal neurological deficits. Extremities: Both lower extremities with compression dressings on, not taken off. Skin: Previous notes mentions chronic ulcerations on both legs; not examined today.  Psychiatry: Judgement and insight appear normal. Mood & affect appropriate.     Data Reviewed: I have personally reviewed following labs and imaging studies  CBC:  Recent Labs Lab 12/28/16 1406 12/29/16 0540  WBC 17.4* 13.4*  NEUTROABS 14.1* 10.6*  HGB 12.3* 11.7*  HCT 42.3 39.8  MCV 89.6 88.6  PLT 409* 903   Basic Metabolic Panel:  Recent Labs Lab 12/28/16 1406  12/29/16 0540 12/29/16 1227 12/29/16 1822 12/29/16 2342 12/30/16 0439  NA 140  --  139  --   --   --   --  K 5.7*  < > 4.6 4.3 4.7 4.8 4.4  CL 99*  --  104  --   --   --   --   CO2 27  --  26  --   --   --   --   GLUCOSE 159*  --  81  --   --   --   --   BUN 49*  --  42*  --   --   --   --   CREATININE 2.13*  --  1.19  --   --   --   --   CALCIUM 9.2  --  8.2*  --   --   --   --   < > = values in this interval not displayed. GFR: Estimated Creatinine Clearance: 80.6 mL/min (by C-G formula based on SCr of 1.19 mg/dL). Liver Function Tests:  Recent Labs Lab 12/28/16 1406  AST 32  ALT 26  ALKPHOS 80  BILITOT 0.7  PROT 7.2  ALBUMIN 3.4*   No results for input(s): LIPASE, AMYLASE in the last 168 hours.  Recent Labs Lab 12/28/16 1406  AMMONIA 38*   Coagulation Profile: No results for input(s): INR, PROTIME in the last 168 hours. Cardiac  Enzymes: No results for input(s): CKTOTAL, CKMB, CKMBINDEX, TROPONINI in the last 168 hours. BNP (last 3 results) No results for input(s): PROBNP in the last 8760 hours. HbA1C: No results for input(s): HGBA1C in the last 72 hours. CBG:  Recent Labs Lab 12/29/16 1548 12/29/16 1933 12/29/16 2318 12/30/16 0449 12/30/16 0751  GLUCAP 174* 137* 85 88 101*   Lipid Profile: No results for input(s): CHOL, HDL, LDLCALC, TRIG, CHOLHDL, LDLDIRECT in the last 72 hours. Thyroid Function Tests:  Recent Labs  12/29/16 1227  TSH 1.853   Anemia Panel:  Recent Labs  12/29/16 1227  VITAMINB12 349   Sepsis Labs:  Recent Labs Lab 12/28/16 1420 12/28/16 1833 12/28/16 1900 12/28/16 2106  PROCALCITON  --   --  <0.10  --   LATICACIDVEN 3.79* 0.56  --  2.1*    Recent Results (from the past 240 hour(s))  Culture, blood (routine x 2)     Status: None (Preliminary result)   Collection Time: 12/28/16  6:20 PM  Result Value Ref Range Status   Specimen Description BLOOD RIGHT FOREARM  Final   Special Requests   Final    BOTTLES DRAWN AEROBIC ONLY Blood Culture results may not be optimal due to an inadequate volume of blood received in culture bottles   Culture NO GROWTH 2 DAYS  Final   Report Status PENDING  Incomplete  Culture, blood (routine x 2)     Status: None (Preliminary result)   Collection Time: 12/28/16  6:26 PM  Result Value Ref Range Status   Specimen Description BLOOD RIGHT WRIST  Final   Special Requests   Final    BOTTLES DRAWN AEROBIC ONLY Blood Culture adequate volume   Culture NO GROWTH 2 DAYS  Final   Report Status PENDING  Incomplete  MRSA PCR Screening     Status: Abnormal   Collection Time: 12/28/16  8:31 PM  Result Value Ref Range Status   MRSA by PCR POSITIVE (A) NEGATIVE Final    Comment: RESULT CALLED TO, READ BACK BY AND VERIFIED WITH: HEARN,J @2305  BY MATTHEWS,B 9.20.18          Radiology Studies: Dg Chest 2 View  Result Date:  12/28/2016 CLINICAL DATA:  Altered mental status EXAM:  CHEST  2 VIEW COMPARISON:  November 27, 2015 FINDINGS: There is airspace consolidation in the posterior right base. Lungs elsewhere clear. Heart is mildly enlarged with pulmonary vascularity within normal limits. No adenopathy. No evident bone lesions. IMPRESSION: Airspace consolidation posterior right base consistent with pneumonia. Lungs elsewhere clear. Stable cardiac prominence. Followup PA and lateral chest radiographs recommended in 3-4 weeks following trial of antibiotic therapy to ensure resolution and exclude underlying malignancy. Electronically Signed   By: Lowella Grip III M.D.   On: 12/28/2016 17:01   Ct Head Wo Contrast  Result Date: 12/28/2016 CLINICAL DATA:  Altered level of consciousness. EXAM: CT HEAD WITHOUT CONTRAST TECHNIQUE: Contiguous axial images were obtained from the base of the skull through the vertex without intravenous contrast. COMPARISON:  CT scan of June 19, 2010. FINDINGS: Brain: Mild chronic ischemic white matter disease is noted. No mass effect or midline shift is noted. Ventricular size is within normal limits. There is no evidence of mass lesion, hemorrhage or acute infarction. Vascular: No hyperdense vessel or unexpected calcification. Skull: Normal. Negative for fracture or focal lesion. Sinuses/Orbits: No acute finding. Other: None. IMPRESSION: Mild chronic ischemic white matter disease. No acute intracranial abnormality seen. Electronically Signed   By: Marijo Conception, M.D.   On: 12/28/2016 17:45   US Renal  Result Date: 12/29/2016 CLINICAL DATA:  Acute renal failure. History of diabetes, hypertension, obesity, atrial fibrillation. EXAM: RENAL / URINARY TRACT ULTRASOUND COMPLETE COMPARISON:  None. FINDINGS: Right Kidney: Length: 12.1 cm. Cortical echogenicity and thickness appears normal. Right renal cyst, upper pole, measures 3.4 x 3.2 cm. No suspicious mass or hydronephrosis. No perinephric fluid. Left  Kidney: Length: 11.5 cm. Cortical echogenicity and thickness appears normal. No mass or hydronephrosis visualized. Bladder: Appears normal for degree of bladder distention. Gallbladder appears to be filled with sludge in stones, largest measurable stone is 3.4 cm. IMPRESSION: 1. Right renal cyst, measuring 3.4 cm. Kidneys appear otherwise normal. No hydronephrosis. 2. Bladder appears normal. 3. **An incidental finding of potential clinical significance has been found. Gallbladder filled with sludge and stones, largest measurable stone is 3.4 cm greatest dimension. ** Electronically Signed   By: Franki Cabot M.D.   On: 12/29/2016 12:33        Scheduled Meds: . atorvastatin  10 mg Oral q1800  . chlorhexidine  15 mL Mouth Rinse BID  . Chlorhexidine Gluconate Cloth  6 each Topical Q0600  . cholecalciferol  2,000 Units Oral Daily  . dabigatran  150 mg Oral Q12H  . digoxin  0.125 mg Oral Daily  . feeding supplement (PRO-STAT SUGAR FREE 64)  45 mL Oral TID WC  . gabapentin  300 mg Oral TID  . guaiFENesin  600 mg Oral BID  . insulin aspart  0-9 Units Subcutaneous Q4H  . insulin glargine  15 Units Subcutaneous QHS  . mouth rinse  15 mL Mouth Rinse q12n4p  . mupirocin ointment  1 application Nasal BID  . silver sulfADIAZINE   Topical Daily  . verapamil  80 mg Oral Q8H  . vitamin C  500 mg Oral BID  . zinc sulfate  220 mg Oral Daily   Continuous Infusions: . sodium chloride 100 mL/hr at 12/30/16 0450  . ceFEPime (MAXIPIME) IV Stopped (12/30/16 0601)  . sodium chloride    . vancomycin Stopped (12/30/16 0013)     LOS: 2 days    Time spent: 40 minutes.    Rexene Alberts, MD [facility] Pager [phone]  If 7PM-7AM, please contact  night-coverage [email or web address] Password [facility] 12/30/2016, 8:13 AM

## 2016-12-30 NOTE — Plan of Care (Signed)
Problem: Skin Integrity: Goal: Risk for impaired skin integrity will decrease Outcome: Progressing Pt's is being turned Q2. Bed is on rotation. Wound nurse seen patient and has directions on dressing changes. Sacral foams being changed PRN. Moisture barrier cream being applied to skin as necessary.

## 2016-12-30 NOTE — Progress Notes (Signed)
Patient has had no stools to measure during day shift. Complained of diarrhea last night.

## 2016-12-30 NOTE — Plan of Care (Signed)
Problem: Tissue Perfusion: Goal: Risk factors for ineffective tissue perfusion will decrease Outcome: Progressing Pt continued onhome med of pradaxa for dvt prevention

## 2016-12-31 LAB — CBC
HEMATOCRIT: 38.5 % — AB (ref 39.0–52.0)
HEMOGLOBIN: 11.4 g/dL — AB (ref 13.0–17.0)
MCH: 26.3 pg (ref 26.0–34.0)
MCHC: 29.6 g/dL — AB (ref 30.0–36.0)
MCV: 88.9 fL (ref 78.0–100.0)
Platelets: 352 10*3/uL (ref 150–400)
RBC: 4.33 MIL/uL (ref 4.22–5.81)
RDW: 17.3 % — ABNORMAL HIGH (ref 11.5–15.5)
WBC: 9.9 10*3/uL (ref 4.0–10.5)

## 2016-12-31 LAB — BASIC METABOLIC PANEL
Anion gap: 6 (ref 5–15)
BUN: 22 mg/dL — AB (ref 6–20)
CHLORIDE: 99 mmol/L — AB (ref 101–111)
CO2: 34 mmol/L — AB (ref 22–32)
CREATININE: 0.49 mg/dL — AB (ref 0.61–1.24)
Calcium: 8.8 mg/dL — ABNORMAL LOW (ref 8.9–10.3)
GFR calc non Af Amer: >60 mL/min (ref >60)
Glucose, Bld: 90 mg/dL (ref 65–99)
POTASSIUM: 4.3 mmol/L (ref 3.5–5.1)
Sodium: 139 mmol/L (ref 135–145)

## 2016-12-31 LAB — GLUCOSE, CAPILLARY
GLUCOSE-CAPILLARY: 119 mg/dL — AB (ref 65–99)
GLUCOSE-CAPILLARY: 134 mg/dL — AB (ref 65–99)
Glucose-Capillary: 130 mg/dL — ABNORMAL HIGH (ref 65–99)
Glucose-Capillary: 116 mg/dL — ABNORMAL HIGH (ref 65–99)

## 2016-12-31 MED ORDER — DOCUSATE SODIUM 100 MG PO CAPS
100.0000 mg | ORAL_CAPSULE | Freq: Every day | ORAL | Status: DC
  Administered 2016-12-31 – 2017-01-01 (×2): 100 mg via ORAL
  Filled 2016-12-31 (×2): qty 1

## 2016-12-31 MED ORDER — SACCHAROMYCES BOULARDII 250 MG PO CAPS
250.0000 mg | ORAL_CAPSULE | Freq: Two times a day (BID) | ORAL | Status: DC
Start: 2016-12-31 — End: 2017-01-01
  Administered 2016-12-31 – 2017-01-01 (×3): 250 mg via ORAL
  Filled 2016-12-31 (×3): qty 1

## 2016-12-31 NOTE — Progress Notes (Signed)
PROGRESS NOTE    ASPEN DETERDING  JSR:[id number] DOB: 1946-02-19 DOA: 12/28/2016 PCP: Christain Sacramento, MD    Brief Narrative:  Patient is a 71 year old obese male with A. fib on Pradaxa, IDDM, HTN and chronic venous stasis ulcers was admitted from the SNF for hyperkalemia. Patient was reportedly started on Rocephin at the facility for suspected pneumonia. On the day of admission patient was found to be in some respiratory distress. Blood work at the SNF revealed a potassium of 7.4.. He was given Kayexalate, dextrose and insulin, albuterol and sent to the ED for further evaluation. Patient had been receiving wound care at the nursing facility for bilateral venous stasis ulcer.  In the ED he was afebrile, tachycardic, tachypneic and hypotensive . He received 2 L IV normal saline bolus with improvement. EKG revealed inferolateral ST changes. Head CT was negative for acute intracranial abnormality. Chest x-ray showed right basilar consolidation suggestive of pneumonia. Patient met the criteria for sepsis with tachycardia, tachypnea, WBC of 17.4 K and elevated lactic acid of 3.7. He also had acute kidney injury.    Assessment & Plan:   Principal Problem:   Sepsis due to pneumonia [facility]) Active Problems:   Hyperkalemia   Acute respiratory failure with hypoxia (HCC)   Type 2 diabetes mellitus without complication (HCC)   Current use of long term anticoagulation   AKI (acute kidney injury) ([place])   Venous stasis ulcers (HCC)   Morbid obesity (HCC)   Atrial fibrillation (HCC)   Anemia of chronic disease    1. Sepsis secondary to HCAP. Patient was given Rocephin and azithromycin and a 2 L bolus of IV fluids in the ED. Blood cultures were ordered. He was subsequently started on cefepime and vancomycin. -His lactic acid level was elevated on admission and now has improved. -His WBC was 17.4 on admission and has improved and normalized. -His blood pressure and tachycardia have improved. -Blood  cultures are negative to date. -Strep pneumo antigen negative.  Legionella antigen negative. Sputum culture pending. -Patient is clinically improving, but he desires to stay in the hospital as long as possible due to his chronic deconditioning/debilitation. I informed him that he would be discharged when clinically appropriate, likely in a couple days. -Continue current management.  Acute respiratory failure with hypoxia. Patient and patient is noted to desaturate to the 70s to 80s, particularly while asleep. He probably has undiagnosed sleep apnea versus obesity hypoventilation syndrome in the setting of pneumonia.. Continue CPAP/ BiPAP as ordered when necessary and at bedtime. Oxygen added and titrated to keep his oxygen saturations greater than 90%. -Would recommend outpatient sleep study and evaluation for CPAP treatment. -We'll continue to monitor and titrate oxygen as needed.  Hyperkalemia. Patient's serum potassium was 7.4 at the SNF, but 5.7 in the ED on admission. He had been treated appropriately at the SNF prior to admission. -His serum potassium has normalized. -The likely etiology was acute kidney injury and sepsis.  Acute kidney injury. Patient's creatinine was 2.13 on admission with a normal baseline. -He was volume repleted with IV fluids -Renal ultrasound revealed a "normal bladder" and right renal cyst at 3.4 cm. -His renal function has improved/normalized.  Chronic atrial fibrillation with RVR. Patient is treated chronically with verapamil, digoxin and Pradaxa. His heart rate was in the 110's on admission, which was likely due to volume depletion and sepsis. His digoxin level was therapeutic. -Following volume resuscitation, his heart rate has improved. -His TSH was within normal limits. -We'll continue digoxin,  verapamil and Pradaxa.  Chronic venous stasis ulceration/edema of lower extremities. Patient has a long history of bilateral chronic venous stasis changes and  ulcerations; treated chronically at the SNF with Unna boots. -Wound care nurse consulted. Recommendations for Unna boots from home recommended. Dressing/Unna boots are currently applied and will be changed every Tuesday.  Pressure injury on buttocks. -Barrier cream to protect her buttocks and repel moisture ordered.  Type 2 diabetes. Patient is treated chronically with [place] and metformin. His hemoglobin A1c was 6.12 June 2016. Past medications above placed on hold. Patient was started on Lantus and sliding-scale NovoLog. -His CBGs have been on the lower side of normal, so Lantus was decreased from 15 units to 10.  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 Patient reports chronic "diarrhea".. It does not appear to be infectious per my conversation with the nursing staff, but C. difficile and GI pathogen panel ordered to be on the safe side. Patient reported not having a colonoscopy ever. He was advised to have an elective colonoscopy for definitive evaluation and for colon cancer screening. The patient did not appear to be receptive to this advice.  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 -C. difficile and GI pathogen panel pending. -We'll add probiotic and a stool softener. Lomotil once daily as needed.   Severe deconditioning. Patient reports having progressive difficulty walking at home. He had been in rehabilitation on several occasions, but he says that "it don't do any good". He has a wheelchair at home. He is receptive to returning to the SNF for rehabilitation, but he says that his insurance days have run out. 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 -Would maximize outpatient home health services at the time of discharge.   DVT prophylaxis: Pradaxa Code  Status: Full code Family Communication: Family not available Disposition Plan: Discharge home  depending on hospital course, likely in the next couple of days with home health services..   Consultants:   None  Procedures:   BiPAP/CPAP as needed  Antimicrobials:   Cefepime 9/20>>  Vancomycin 9/20>>   Subjective: Patient says he is concerned about his chronic inability to walk due to weakness in his legs. This is not new. He reports that rehabilitation in the past has not been effective. He denies any other neurological symptoms such as difficulty speaking, swallowing, facial droop, or weakness in his upper extremities. His breathing is somewhat better. He denies chest pain.   Objective: Vitals:   12/31/16 0300 12/31/16 0400 12/31/16 0500 12/31/16 0600  BP: 136/73 96/60 137/75 (!) 148/88  Pulse: (!) 106 (!) 107 (!) 106 (!) 105  Resp: 19 18 20 19   Temp:      TempSrc:      SpO2: 95% 94% 93% 98%  Weight:      Height:        Intake/Output Summary (Last 24 hours) at 12/31/16 0828 Last data filed at 12/31/16 0551  Gross per 24 hour  Intake             1390 ml  Output              300 ml  Net             1090 ml   Filed Weights   12/28/16 2100 12/29/16 0500 12/30/16 0600  Weight: 133 kg (293 lb 3.4 oz) 134.7 kg (  296 lb 15.4 oz) 133.8 kg (294 lb 15.6 oz)    Examination:  General exam: Appears calm and comfortable  Respiratory system: Occasional/ few scattered crackles. Respiratory effort normal. Cardiovascular system: Irregular, irregular. Trace pedal edema bilaterally. Gastrointestinal system: Abdomen is obese, nondistended, soft and nontender. No organomegaly or masses felt. Normal bowel sounds heard. Central nervous system: Alert and oriented. No focal neurological deficits except the patient is barely able to raise both legs against gravity (this is chronic). Extremities: Both lower extremities with compression dressings on, not taken off. Skin: Previous notes  mentions chronic ulcerations on both legs; not examined today.  Psychiatry: Judgement and insight appear normal. Mood & affect appropriate.     Data Reviewed: I have personally reviewed following labs and imaging studies  CBC:  Recent Labs Lab 12/28/16 1406 12/29/16 0540 12/31/16 0512  WBC 17.4* 13.4* 9.9  NEUTROABS 14.1* 10.6*  --   HGB 12.3* 11.7* 11.4*  HCT 42.3 39.8 38.5*  MCV 89.6 88.6 88.9  PLT 409* 303 161   Basic Metabolic Panel:  Recent Labs Lab 12/28/16 1406  12/29/16 0540  12/29/16 2342 12/30/16 0439 12/30/16 1022 12/30/16 1457 12/31/16 0512  NA 140  --  139  --   --   --  141  --  139  K 5.7*  < > 4.6  < > 4.8 4.4 4.4 4.6 4.3  CL 99*  --  104  --   --   --  102  --  99*  CO2 27  --  26  --   --   --  32  --  34*  GLUCOSE 159*  --  81  --   --   --  151*  --  90  BUN 49*  --  42*  --   --   --  28*  --  22*  CREATININE 2.13*  --  1.19  --   --   --  0.57*  --  0.49*  CALCIUM 9.2  --  8.2*  --   --   --  8.8*  --  8.8*  < > = values in this interval not displayed. GFR: Estimated Creatinine Clearance: 119.9 mL/min (A) (by C-G formula based on SCr of 0.49 mg/dL (L)). Liver Function Tests:  Recent Labs Lab 12/28/16 1406  AST 32  ALT 26  ALKPHOS 80  BILITOT 0.7  PROT 7.2  ALBUMIN 3.4*   No results for input(s): LIPASE, AMYLASE in the last 168 hours.  Recent Labs Lab 12/28/16 1406  AMMONIA 38*   Coagulation Profile: No results for input(s): INR, PROTIME in the last 168 hours. Cardiac Enzymes: No results for input(s): CKTOTAL, CKMB, CKMBINDEX, TROPONINI in the last 168 hours. BNP (last 3 results) No results for input(s): PROBNP in the last 8760 hours. HbA1C: No results for input(s): HGBA1C in the last 72 hours. CBG:  Recent Labs Lab 12/30/16 0449 12/30/16 0751 12/30/16 1140 12/30/16 1641 12/30/16 2158  GLUCAP 88 101* 151* 107* 137*   Lipid Profile: No results for input(s): CHOL, HDL, LDLCALC, TRIG, CHOLHDL, LDLDIRECT in the last 72  hours. Thyroid Function Tests:  Recent Labs  12/29/16 1227  TSH 1.853   Anemia Panel:  Recent Labs  12/29/16 1227  VITAMINB12 349   Sepsis Labs:  Recent Labs Lab 12/28/16 1420 12/28/16 1833 12/28/16 1900 12/28/16 2106  PROCALCITON  --   --  <0.10  --   LATICACIDVEN 3.79* 0.56  --  2.1*  Recent Results (from the past 240 hour(s))  Culture, blood (routine x 2)     Status: None (Preliminary result)   Collection Time: 12/28/16  6:20 PM  Result Value Ref Range Status   Specimen Description BLOOD RIGHT FOREARM  Final   Special Requests   Final    BOTTLES DRAWN AEROBIC ONLY Blood Culture results may not be optimal due to an inadequate volume of blood received in culture bottles   Culture NO GROWTH 3 DAYS  Final   Report Status PENDING  Incomplete  Culture, blood (routine x 2)     Status: None (Preliminary result)   Collection Time: 12/28/16  6:26 PM  Result Value Ref Range Status   Specimen Description BLOOD RIGHT WRIST  Final   Special Requests   Final    BOTTLES DRAWN AEROBIC ONLY Blood Culture adequate volume   Culture NO GROWTH 3 DAYS  Final   Report Status PENDING  Incomplete  MRSA PCR Screening     Status: Abnormal   Collection Time: 12/28/16  8:31 PM  Result Value Ref Range Status   MRSA by PCR POSITIVE (A) NEGATIVE Final    Comment: RESULT CALLED TO, READ BACK BY AND VERIFIED WITH: HEARN,J @2305  BY MATTHEWS,B 9.20.18          Radiology Studies: US Renal  Result Date: 12/29/2016 CLINICAL DATA:  Acute renal failure. History of diabetes, hypertension, obesity, atrial fibrillation. EXAM: RENAL / URINARY TRACT ULTRASOUND COMPLETE COMPARISON:  None. FINDINGS: Right Kidney: Length: 12.1 cm. Cortical echogenicity and thickness appears normal. Right renal cyst, upper pole, measures 3.4 x 3.2 cm. No suspicious mass or hydronephrosis. No perinephric fluid. Left Kidney: Length: 11.5 cm. Cortical echogenicity and thickness appears normal. No mass or hydronephrosis  visualized. Bladder: Appears normal for degree of bladder distention. Gallbladder appears to be filled with sludge in stones, largest measurable stone is 3.4 cm. IMPRESSION: 1. Right renal cyst, measuring 3.4 cm. Kidneys appear otherwise normal. No hydronephrosis. 2. Bladder appears normal. 3. **An incidental finding of potential clinical significance has been found. Gallbladder filled with sludge and stones, largest measurable stone is 3.4 cm greatest dimension. ** Electronically Signed   By: Franki Cabot M.D.   On: 12/29/2016 12:33        Scheduled Meds: . atorvastatin  10 mg Oral q1800  . chlorhexidine  15 mL Mouth Rinse BID  . Chlorhexidine Gluconate Cloth  6 each Topical Q0600  . cholecalciferol  2,000 Units Oral Daily  . dabigatran  150 mg Oral Q12H  . digoxin  0.125 mg Oral Daily  . feeding supplement (PRO-STAT SUGAR FREE 64)  45 mL Oral TID WC  . gabapentin  300 mg Oral TID  . guaiFENesin  600 mg Oral BID  . insulin aspart  0-5 Units Subcutaneous QHS  . insulin aspart  0-9 Units Subcutaneous TID WC  . insulin glargine  10 Units Subcutaneous QHS  . mouth rinse  15 mL Mouth Rinse q12n4p  . mupirocin ointment  1 application Nasal BID  . silver sulfADIAZINE   Topical Daily  . sodium chloride flush  3 mL Intravenous Q12H  . verapamil  80 mg Oral Q8H  . vitamin C  500 mg Oral BID  . zinc sulfate  220 mg Oral Daily   Continuous Infusions: . ceFEPime (MAXIPIME) IV 2 g (12/31/16 0551)  . sodium chloride    . vancomycin Stopped (12/30/16 2305)     LOS: 3 days    Time spent: 40 minutes.  Rexene Alberts, MD [facility] Pager [phone]  If 7PM-7AM, please contact night-coverage [email or web address] Password [facility] 12/31/2016, 8:28 AM

## 2016-12-31 NOTE — Progress Notes (Signed)
After hanging Cefepime and starting to hang Vancomycin, pt c/o burning at IV site. IV infiltrated, ice pack applied. Will continue to monitor pt

## 2016-12-31 NOTE — Progress Notes (Signed)
Patient transferred to 300 department via bed and report given to nurse.

## 2017-01-01 DIAGNOSIS — D638 Anemia in other chronic diseases classified elsewhere: Secondary | ICD-10-CM

## 2017-01-01 DIAGNOSIS — N179 Acute kidney failure, unspecified: Secondary | ICD-10-CM

## 2017-01-01 DIAGNOSIS — Z794 Long term (current) use of insulin: Secondary | ICD-10-CM

## 2017-01-01 DIAGNOSIS — E875 Hyperkalemia: Secondary | ICD-10-CM

## 2017-01-01 DIAGNOSIS — R6521 Severe sepsis with septic shock: Secondary | ICD-10-CM

## 2017-01-01 DIAGNOSIS — E119 Type 2 diabetes mellitus without complications: Secondary | ICD-10-CM

## 2017-01-01 DIAGNOSIS — M1990 Unspecified osteoarthritis, unspecified site: Secondary | ICD-10-CM | POA: Diagnosis present

## 2017-01-01 DIAGNOSIS — A419 Sepsis, unspecified organism: Secondary | ICD-10-CM

## 2017-01-01 DIAGNOSIS — R29898 Other symptoms and signs involving the musculoskeletal system: Secondary | ICD-10-CM | POA: Diagnosis present

## 2017-01-01 DIAGNOSIS — L97411 Non-pressure chronic ulcer of right heel and midfoot limited to breakdown of skin: Secondary | ICD-10-CM

## 2017-01-01 DIAGNOSIS — R197 Diarrhea, unspecified: Secondary | ICD-10-CM | POA: Diagnosis present

## 2017-01-01 DIAGNOSIS — I83014 Varicose veins of right lower extremity with ulcer of heel and midfoot: Secondary | ICD-10-CM

## 2017-01-01 LAB — BASIC METABOLIC PANEL
Anion gap: 7 (ref 5–15)
BUN: 19 mg/dL (ref 6–20)
CO2: 35 mmol/L — ABNORMAL HIGH (ref 22–32)
Calcium: 9 mg/dL (ref 8.9–10.3)
Chloride: 96 mmol/L — ABNORMAL LOW (ref 101–111)
Creatinine, Ser: 0.48 mg/dL — ABNORMAL LOW (ref 0.61–1.24)
GFR calc Af Amer: >60 mL/min (ref >60)
GFR calc non Af Amer: >60 mL/min (ref >60)
Glucose, Bld: 92 mg/dL (ref 65–99)
Potassium: 3.9 mmol/L (ref 3.5–5.1)
Sodium: 138 mmol/L (ref 135–145)

## 2017-01-01 LAB — CBC
HCT: 39.9 % (ref 39.0–52.0)
Hemoglobin: 11.9 g/dL — ABNORMAL LOW (ref 13.0–17.0)
MCH: 26 pg (ref 26.0–34.0)
MCHC: 29.8 g/dL — ABNORMAL LOW (ref 30.0–36.0)
MCV: 87.1 fL (ref 78.0–100.0)
Platelets: 300 10*3/uL (ref 150–400)
RBC: 4.58 MIL/uL (ref 4.22–5.81)
RDW: 17.3 % — ABNORMAL HIGH (ref 11.5–15.5)
WBC: 9.8 10*3/uL (ref 4.0–10.5)

## 2017-01-01 LAB — GLUCOSE, CAPILLARY
Glucose-Capillary: 97 mg/dL (ref 65–99)
Glucose-Capillary: 142 mg/dL — ABNORMAL HIGH (ref 65–99)

## 2017-01-01 LAB — VANCOMYCIN, TROUGH: VANCOMYCIN TR: 15 ug/mL (ref 15–20)

## 2017-01-01 MED ORDER — LEVOFLOXACIN 750 MG PO TABS: 750.0000 mg | ORAL_TABLET | Freq: Every day | ORAL | 0 refills | Status: DC

## 2017-01-01 MED ORDER — SACCHAROMYCES BOULARDII 250 MG PO CAPS: 250.0000 mg | ORAL_CAPSULE | Freq: Two times a day (BID) | ORAL | 0 refills | Status: DC

## 2017-01-01 MED ORDER — MUPIROCIN 2 % EX OINT: 1.0000 "application " | TOPICAL_OINTMENT | Freq: Two times a day (BID) | CUTANEOUS | 0 refills | Status: AC

## 2017-01-01 MED ORDER — SILVER SULFADIAZINE 1 % EX CREA: TOPICAL_CREAM | Freq: Every day | CUTANEOUS | 0 refills | Status: DC

## 2017-01-01 MED ORDER — LEVOFLOXACIN 750 MG PO TABS
750.0000 mg | ORAL_TABLET | Freq: Every day | ORAL | Status: DC
  Administered 2017-01-01: 750 mg via ORAL
  Filled 2017-01-01: qty 1

## 2017-01-01 NOTE — Clinical Social Work Note (Signed)
Patient is discharging home due to being in coinsurance days. Patient's Medicaid application has been submitted by [facility] per [facility]   LCSW signing off.   Desmon Hitchner, Juleen China, LCSW

## 2017-01-01 NOTE — Care Management (Signed)
CM arranged EMS transport for 1330 per patient request. Home health arranged. Hoyer Lift ordered. Patient declines need for hospital bed (sleeps in lift chair). No other CM needs. [facility] added to AVS.

## 2017-01-01 NOTE — Care Management Note (Signed)
Case Management Note  Patient Details  Name: Jonathan Wise MRN: [id number] Date of Birth: Apr 03, 1946    Expected Discharge Date:     01/01/2017             Expected Discharge Plan:  Skilled Nursing Facility  In-House Referral:  Clinical Social Work  Discharge planning Services  CM Consult  Post Acute Care Choice:  Durable Medical Equipment, Home Health Choice offered to:  Patient  DME Arranged:  Other see comment (hoyer lift) DME Agency:  [facility]  HH Arranged:  RN, PT, Social Work, OT HH Agency:  [facility] (now [facility])  Status of Service:  Completed, signed off  If discussed at [employer], dates discussed:    Additional Comments: Patient discharging home today. From [facility], does not want to return as he is in co-insurance days and can not afford. He reports that SW at [facility] was helping him to apply for Medicaid and wants to discuss with SW here, CSW notified and will f/u. Patient will DC home with home health, has had [facility] previously and would like again. He has RW and BSC, and lift chair. He would like a hoyer lift chair as recommended per PT. Bonita Quin of [facility] will obtain order for hoyer lift and ship to home. Will notify Tim of [facility] for home health orders.  Lily Velasquez, Chrystine Oiler, RN 01/01/2017, 10:04 AM

## 2017-01-01 NOTE — Progress Notes (Signed)
RN spoke with Jonathan Wise at the [facility] and made her aware of the missed 2300 dose of Vancomycin as well as the 0600 dose that is to be missed this am waiting on a PICC line.  Sent a message to pharmacist at [facility] to make them aware as well.

## 2017-01-01 NOTE — Progress Notes (Signed)
After speaking with Dr. Bruna Potter, new order for PICC line. Dr Bruna Potter stated to hold IV antibiotics if no IV access.

## 2017-01-01 NOTE — Progress Notes (Signed)
Physical Therapy Treatment Patient Details Name: Jonathan Wise MRN: [id number] DOB: 1945/10/04 Today's Date: 01/01/2017    History of Present Illness Jonathan Wise is a 71 y.o. male with medical history significant for atrial fibrillation on Peridex, insulin-dependent diabetes mellitus, obesity, and hypertension, and into the emergency department from his SNF for evaluation of hyperkalemia. Patient has reportedly just been started on Rocephin at the nursing facility for suspected pneumonia and was in acute distress today. Blood work was obtained at the nursing facility and notable for a potassium of 7.4. Patient was treated with 30 g Kayexalate, dextrose with insulin, albuterol, and continued on his Rocephin. He was sent to the ED for further evaluation and management. Patient denied any specific complaints. He reports feeling unwell. He has been receiving wound care at the nursing facility for bilateral lower extremity ulcers attributed to chronic venous stasis.    PT Comments    Patient demonstrates improvement for sitting up at bedside and scooting laterally requiring less assistance.  Patient with his spouse present instructed in and given written instructions for HEP with good return demonstrated and understanding acknowledged.  Case manager informed that the patient will benefit from use of hoyar lift for home use to assist with transfers to Jonathan Wise.  Patient will benefit from continued physical therapy in hospital and recommended venue below to increase strength, balance, endurance for safe ADLs and gait.    Follow Up Recommendations  SNF;Supervision/Assistance - 24 hour;Home health PT     Equipment Recommendations  None recommended by PT    Recommendations for Other Services       Precautions / Restrictions Precautions Precautions: Fall Restrictions Weight Bearing Restrictions: No    Mobility  Bed Mobility Overal bed mobility: Needs Assistance   Rolling: Min guard   Supine  to sit: Min assist;Mod assist (with head of bed raised) Sit to supine: Mod assist   General bed mobility comments: Min guard scooting laterally at bedside  Transfers                    Ambulation/Gait                 Stairs            Wheelchair Mobility    Modified Rankin (Stroke Patients Only)       Balance Overall balance assessment: Needs assistance Sitting-balance support: No upper extremity supported;Feet supported Sitting balance-Leahy Scale: Good                                      Cognition Arousal/Alertness: Awake/alert Behavior During Therapy: WFL for tasks assessed/performed Overall Cognitive Status: Within Functional Limits for tasks assessed                                        Exercises General Exercises - Lower Extremity Ankle Circles/Pumps: Seated;AROM;Both;10 reps Quad Sets: AROM;Both;Supine;10 reps Gluteal Sets: AROM;Both;10 reps;Supine Short Arc Quad: AROM;Both;10 reps;Supine Long Arc Quad: AROM;Seated;Both;10 reps Heel Slides: AROM;Both;10 reps;Supine Hip ABduction/ADduction: Both;AAROM;10 reps;Supine Hip Flexion/Marching: AROM;Seated;Both;10 reps    General Comments        Pertinent Vitals/Pain Pain Assessment: Faces Pain Score: 3  Pain Location: generalized arthritic pain in shoulders and hips Pain Descriptors / Indicators: Aching Pain Intervention(s): Limited activity within patient's tolerance;Monitored during session  Home Living                      Prior Function            PT Goals (current goals can now be found in the care plan section) Acute Rehab PT Goals Patient Stated Goal: Return home after rehab PT Goal Formulation: With patient Time For Goal Achievement: 01/12/17 Potential to Achieve Goals: Fair Progress towards PT goals: Progressing toward goals    Frequency    Min 3X/week      PT Plan Current plan remains appropriate     Co-evaluation              AM-PAC PT "6 Clicks" Daily Activity  Outcome Measure  Difficulty turning over in bed (including adjusting bedclothes, sheets and blankets)?: Unable Difficulty moving from lying on back to sitting on the side of the bed? : Unable Difficulty sitting down on and standing up from a chair with arms (e.g., wheelchair, bedside commode, etc,.)?: Unable Help needed moving to and from a bed to chair (including a wheelchair)?: Total Help needed walking in hospital room?: Total Help needed climbing 3-5 steps with a railing? : Total 6 Click Score: 6    End of Session   Activity Tolerance: Patient tolerated treatment well Patient left: in bed;with call bell/phone within reach;with family/visitor present Nurse Communication: Mobility status;Need for lift equipment PT Visit Diagnosis: Unsteadiness on feet (R26.81);Muscle weakness (generalized) (M62.81);Other abnormalities of gait and mobility (R26.89)     Time: 1610-9604 PT Time Calculation (min) (ACUTE ONLY): 40 min  Charges:  $Therapeutic Exercise: 8-22 mins $Therapeutic Activity: 23-37 mins                    G Codes:       1:11 PM, 2017-01-18 Jonathan Wise, MPT Physical Therapist with [facility] [phone] office [phone] mobile phone

## 2017-01-01 NOTE — Progress Notes (Signed)
Loss of IV access when starting vancomycin d/t infiltration. No success x3 nurses including AC. Dr. Bruna Potter paged and made aware. Waiting for call back/ orders.

## 2017-01-01 NOTE — Care Management Important Message (Signed)
Important Message  Patient Details  Name: Jonathan Wise MRN: [id number] Date of Birth: [birth year]   Medicare Important Message Given:  Yes    Shar Paez, Chrystine Oiler, RN 01/01/2017, 10:22 AM

## 2017-01-01 NOTE — Discharge Summary (Signed)
Physician Discharge Summary  Jonathan Wise:[id number] DOB: [birth year] DOA: 12/28/2016  PCP: Christain Sacramento, MD  Admit date: 12/28/2016 Discharge date: 01/01/2017  Admitted From: SNF Disposition:  Home  Recommendations for Outpatient Follow-up:  1. Follow up with PCP in 1-2 weeks. Patient will complete a seven-day course of antibiotics on 9/28. Please obtain follow-up chest x-ray in about 4 weeks to ensure resolution. 2. Follow-up with orthopedics Dr. Rhona Raider in 3-4 weeks.  Home Health: PT/OT/RN/social work Equipment/Devices:  [employer] fast streamer, rolling walker  Discharge Condition: Fair CODE STATUS: Full code Diet recommendation: Heart Healthy / Carb Modified     Discharge Diagnoses:  Principal Problem:   Sepsis due to pneumonia [facility])   Active Problems:   Venous stasis ulcers ([place])   Type 2 diabetes mellitus without complication (HCC)   Morbid obesity ([place])   Current use of long term anticoagulation   Atrial fibrillation (HCC)   Anemia of chronic disease   Hyperkalemia   Acute respiratory failure with hypoxia (HCC)   AKI (acute kidney injury) (HCC)   Diarrhea   Chronic osteoarthritis   Lower extremity weakness  Brief narrative status history of present illness Please refer to admission H&P for details, in brief,Patient is a 71 year old obese male with A. fib on Pradaxa, IDDM, HTN and chronic venous stasis ulcers was admitted from the SNF for hyperkalemia. Patient was reportedly started on Rocephin at the facility for suspected pneumonia. On the day of admission patient was found to be in some respiratory distress. Blood work at the SNF revealed a potassium of 7.4.. He was given Kayexalate, dextrose and insulin, albuterol and sent to the ED for further evaluation. Patient had been receiving wound care at the nursing facility for bilateral venous stasis ulcer.  In the ED he was afebrile, tachycardic, tachypneic and hypotensive .He received 2 L IV normal saline bolus with  improvement. EKG revealed inferolateral ST changes. Head CT was negative for acute intracranial abnormality. Chest x-ray showed right basilar consolidation suggestive of pneumonia. Patient met the criteria for sepsis with tachycardia, tachypnea, WBC of 17.4 K and elevated lactic acid of 3.7. He also had acute kidney injury.  Hospital course Principal problem  Sepsis secondary to HCAP. -Received IV hydration, empiric antibiotics with vancomycin and cefepime. Sepsis resolved. Blood cultures, strep and Legionella antigen negative. -Remains afebrile and clinically improved. We discharged him on oral Levaquin to complete 7 day course of antibiotic. He needs follow-up chest x-ray in about 4 weeks to ensure resolution.   Acute respiratory failure with hypoxia. Secondary to lobar pneumonia. Cannot rule out underlying OHS. He required BiPAP on admission. Has been stable on room air now. Follow-up as outpatient.  Hyperkalemia.  possibly due to acute kidney injury and sepsis.  potassium was 7.4 at the SNF,  5.7 in the ED on admission.Was treated appropriately at the facility prior to admission. Repeat potassium normal.    Acute kidney injury. Creatinine of 2.13 on admission. Secondary to sepsis. Resolved with IV fluids. Renal ultrasound negative for obstruction or kidney injury.  Chronic atrial fibrillation with RVR. Patient is treated chronically with verapamil, digoxin and Pradaxa.Heart rate elevated due to sepsis on admission now normalized. Digoxin level was normal. TSH normal. Continue home medications.  Chronic venous stasis ulceration/edema of lower extremities. Patient has a long history of bilateral chronic venous stasis changes and ulcerations; treated chronically at the SNF with Unna boots. -Wound care nurse consulted. Recommendations for Unna boots from home recommended. Dressing/Unna boots are currently appliedband dressing changes  recommended twice weekly with Silvadene  ointment.  Pressure injury on buttocks. -Barrier cream to protect her buttocks and repel moisture ordered.  Type 2 diabetes Mellitus  Patient is treated chronically with [place] and metformin. His hemoglobin A1c was 6.12 June 2016. Resume home medications.  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 Patient reports chronic "diarrhea"..No signs of infection. Patient was recommended to have colonoscopy in the past but had refused. Prescribed florastor , stool softener and Lomotil as needed.  Severe deconditioning. Patient reports having progressive difficulty walking at home.He reports he had has not helped him much. Uses wheelchair at home. Patient unable to return to SNF as he has used all his insurance days. 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 Also his morbid obesity and chronic deconditioning along with his comorbidities adding onto his weakness. PT recommends SNF but since he cannot be afforded plan to maximize home health (PT/OT Social Work). Patient follows with orthopedics Dr. Rhona Raider and gets steroid injections periodically. I have instructed him to follow up with him in next 3-4 weeks.  Patient stable to be discharged home with outpatient follow-up.  Family Communication:  none at bedside Disposition Plan:  only home health  Consultants:   None  Procedures:   None       Discharge Instructions   Allergies as of 01/01/2017      Reactions   Furosemide Other (See Comments)   Dizziness , drowsiness      Medication List    STOP taking these medications   cefTRIAXone 1 g injection Commonly known as:  ROCEPHIN     TAKE these medications   atorvastatin 10 MG tablet Commonly known as:  LIPITOR Take 10 mg by mouth daily.   CERTAGEN PO Take 1 tablet by mouth daily.   dabigatran 150 MG Caps capsule Commonly known as:  PRADAXA Take 1 capsule (150 mg total) by mouth every 12 (twelve) hours.   digoxin 0.125 MG  tablet Commonly known as:  LANOXIN Take 1 tablet (0.125 mg total) by mouth daily.   diphenoxylate-atropine 2.5-0.025 MG tablet Commonly known as:  LOMOTIL TAKE 1 TABLET BY MOUTH 4 TIMES A DAY FOR UP TO 10 DAYS AS NEEDED FOR DIARRHEA   feeding supplement (PRO-STAT SUGAR FREE 64) Liqd Take 45 mLs by mouth 3 (three) times daily with meals. For wound healing   gabapentin 300 MG capsule Commonly known as:  NEURONTIN Take 300 mg by mouth 3 (three) times daily.   guaiFENesin 600 MG 12 hr tablet Commonly known as:  MUCINEX Take 600 mg by mouth 2 (two) times daily.   ipratropium-albuterol 0.5-2.5 (3) MG/3ML Soln Commonly known as:  DUONEB Take 3 mLs by nebulization every 6 (six) hours as needed (congestion).   levofloxacin 750 MG tablet Commonly known as:  LEVAQUIN Take 1 tablet (750 mg total) by mouth daily.   metFORMIN 500 MG tablet Commonly known as:  GLUCOPHAGE Take 1,000 mg by mouth 2 (two) times daily with a meal.   mupirocin ointment 2 % Commonly known as:  BACTROBAN Place 1 application into the nose 2 (two) times daily.   saccharomyces boulardii 250 MG capsule Commonly known as:  FLORASTOR Take 1 capsule (250 mg total) by mouth 2 (two) times daily.   silver sulfADIAZINE 1 % cream Commonly known as:  SILVADENE Apply topically daily.   TRESIBA FLEXTOUCH 200 UNIT/ML Sopn Generic drug:  Insulin Degludec Inject 23 Units into the skin daily at 12 noon. Give in the afternoon.  verapamil 80 MG tablet Commonly known as:  CALAN Take 80 mg by mouth 3 (three) times daily.   vitamin C 500 MG tablet Commonly known as:  ASCORBIC ACID Take 500 mg by mouth 2 (two) times daily.   Vitamin D2 2000 units Tabs Take 1 tablet by mouth daily.   zinc sulfate 220 (50 Zn) MG capsule Take 220 mg by mouth daily.            Discharge Care Instructions        Start     Ordered   01/01/17 0000  levofloxacin (LEVAQUIN) 750 MG tablet  Daily     01/01/17 1002   01/01/17 0000   mupirocin ointment (BACTROBAN) 2 %  2 times daily     01/01/17 1002   01/01/17 0000  saccharomyces boulardii (FLORASTOR) 250 MG capsule  2 times daily     01/01/17 1002   01/01/17 0000  silver sulfADIAZINE (SILVADENE) 1 % cream  Daily     01/01/17 1002     Follow-up Information    Christain Sacramento, MD Follow up in 1 week(s).   Specialty:  Family Medicine Contact information: [address] [place] [address] [phone]        Melrose Nakayama, MD. Schedule an appointment as soon as possible for a visit in 3 week(s).   Specialty:  Orthopedic Surgery Contact information: [place] [address] [phone]          Allergies  Allergen Reactions  . Furosemide Other (See Comments)    Dizziness , drowsiness     Procedures/Studies: Dg Chest 2 View  Result Date: 12/28/2016 CLINICAL DATA:  Altered mental status EXAM: CHEST  2 VIEW COMPARISON:  November 27, 2015 FINDINGS: There is airspace consolidation in the posterior right base. Lungs elsewhere clear. Heart is mildly enlarged with pulmonary vascularity within normal limits. No adenopathy. No evident bone lesions. IMPRESSION: Airspace consolidation posterior right base consistent with pneumonia. Lungs elsewhere clear. Stable cardiac prominence. Followup PA and lateral chest radiographs recommended in 3-4 weeks following trial of antibiotic therapy to ensure resolution and exclude underlying malignancy. Electronically Signed   By: Lowella Grip III M.D.   On: 12/28/2016 17:01   Ct Head Wo Contrast  Result Date: 12/28/2016 CLINICAL DATA:  Altered level of consciousness. EXAM: CT HEAD WITHOUT CONTRAST TECHNIQUE: Contiguous axial images were obtained from the base of the skull through the vertex without intravenous contrast. COMPARISON:  CT scan of June 19, 2010. FINDINGS: Brain: Mild chronic ischemic white matter disease is noted. No mass effect or midline shift is noted. Ventricular size is within normal  limits. There is no evidence of mass lesion, hemorrhage or acute infarction. Vascular: No hyperdense vessel or unexpected calcification. Skull: Normal. Negative for fracture or focal lesion. Sinuses/Orbits: No acute finding. Other: None. IMPRESSION: Mild chronic ischemic white matter disease. No acute intracranial abnormality seen. Electronically Signed   By: Marijo Conception, M.D.   On: 12/28/2016 17:45   US Renal  Result Date: 12/29/2016 CLINICAL DATA:  Acute renal failure. History of diabetes, hypertension, obesity, atrial fibrillation. EXAM: RENAL / URINARY TRACT ULTRASOUND COMPLETE COMPARISON:  None. FINDINGS: Right Kidney: Length: 12.1 cm. Cortical echogenicity and thickness appears normal. Right renal cyst, upper pole, measures 3.4 x 3.2 cm. No suspicious mass or hydronephrosis. No perinephric fluid. Left Kidney: Length: 11.5 cm. Cortical echogenicity and thickness appears normal. No mass or hydronephrosis visualized. Bladder: Appears normal for degree of bladder distention. Gallbladder  appears to be filled with sludge in stones, largest measurable stone is 3.4 cm. IMPRESSION: 1. Right renal cyst, measuring 3.4 cm. Kidneys appear otherwise normal. No hydronephrosis. 2. Bladder appears normal. 3. **An incidental finding of potential clinical significance has been found. Gallbladder filled with sludge and stones, largest measurable stone is 3.4 cm greatest dimension. ** Electronically Signed   By: Franki Cabot M.D.   On: 12/29/2016 12:33    (Echo, Carotid, EGD, Colonoscopy, ERCP)    Subjective:   Discharge Exam: Vitals:   01/01/17 0530 01/01/17 0902  BP: (!) 144/92   Pulse: (!) 108   Resp: 18   Temp: 97.8 F (36.6 C)   SpO2: 98% 93%   Vitals:   12/31/16 2050 12/31/16 2316 01/01/17 0530 01/01/17 0902  BP: (!) 168/75  (!) 144/92   Pulse: (!) 109 (!) 110 (!) 108   Resp: _0 Temp: 98.4 F (36.9 C)  97.8 F (36.6 C)   TempSrc: Oral  Axillary   SpO2: 98% 97% 98% 93%  Weight:       Height:        General:Elderly obese male not in distress HEENT: No pallor, moist mucosa, supple neck Chest: Clear to auscultation bilaterally, no added sound CVS: Normal S1-S2, no murmurs rubs or gallops GI: Soft, nondistended, nontender Musculoskeletal: Ace wrap over bilateral leg  CNS: Alert and oriented   The results of significant diagnostics from this hospitalization (including imaging, microbiology, ancillary and laboratory) are listed below for reference.     Microbiology: Recent Results (from the past 240 hour(s))  Culture, blood (routine x 2)     Status: None (Preliminary result)   Collection Time: 12/28/16  6:20 PM  Result Value Ref Range Status   Specimen Description BLOOD RIGHT FOREARM  Final   Special Requests   Final    BOTTLES DRAWN AEROBIC ONLY Blood Culture results may not be optimal due to an inadequate volume of blood received in culture bottles   Culture NO GROWTH 4 DAYS  Final   Report Status PENDING  Incomplete  Culture, blood (routine x 2)     Status: None (Preliminary result)   Collection Time: 12/28/16  6:26 PM  Result Value Ref Range Status   Specimen Description BLOOD RIGHT WRIST  Final   Special Requests   Final    BOTTLES DRAWN AEROBIC ONLY Blood Culture adequate volume   Culture NO GROWTH 4 DAYS  Final   Report Status PENDING  Incomplete  MRSA PCR Screening     Status: Abnormal   Collection Time: 12/28/16  8:31 PM  Result Value Ref Range Status   MRSA by PCR POSITIVE (A) NEGATIVE Final    Comment: RESULT CALLED TO, READ BACK BY AND VERIFIED WITH: HEARN,J _1  BY MATTHEWS,B 9.20.18      Labs: BNP (last 3 results) No results for input(s): BNP in the last 8760 hours. Basic Metabolic Panel:  Recent Labs Lab 12/28/16 1406  12/29/16 0540  12/30/16 0439 12/30/16 1022 12/30/16 1457 12/31/16 0512 01/01/17 0603  NA 140  --  139  --   --  141  --  139 138  K 5.7*  < > 4.6  < > 4.4 4.4 4.6 4.3 3.9  CL 99*  --  104  --   --  102  --  99*  96*  CO2 27  --  26  --   --  32  --  34* 35*  GLUCOSE 159*  --  81  --   --  151*  --  90 92  BUN 49*  --  42*  --   --  28*  --  22* 19  CREATININE 2.13*  --  1.19  --   --  0.57*  --  0.49* 0.48*  CALCIUM 9.2  --  8.2*  --   --  8.8*  --  8.8* 9.0  < > = values in this interval not displayed. Liver Function Tests:  Recent Labs Lab 12/28/16 1406  AST 32  ALT 26  ALKPHOS 80  BILITOT 0.7  PROT 7.2  ALBUMIN 3.4*   No results for input(s): LIPASE, AMYLASE in the last 168 hours.  Recent Labs Lab 12/28/16 1406  AMMONIA 38*   CBC:  Recent Labs Lab 12/28/16 1406 12/29/16 0540 12/31/16 0512 01/01/17 0603  WBC 17.4* 13.4* 9.9 9.8  NEUTROABS 14.1* 10.6*  --   --   HGB 12.3* 11.7* 11.4* 11.9*  HCT 42.3 39.8 38.5* 39.9  MCV 89.6 88.6 88.9 87.1  PLT 409* 303 352 300   Cardiac Enzymes: No results for input(s): CKTOTAL, CKMB, CKMBINDEX, TROPONINI in the last 168 hours. BNP: Invalid input(s): POCBNP CBG:  Recent Labs Lab 12/31/16 0828 12/31/16 1128 12/31/16 1611 12/31/16 2048 01/01/17 0800  GLUCAP 119* 130* 116* 134* 97   D-Dimer No results for input(s): DDIMER in the last 72 hours. Hgb A1c No results for input(s): HGBA1C in the last 72 hours. Lipid Profile No results for input(s): CHOL, HDL, LDLCALC, TRIG, CHOLHDL, LDLDIRECT in the last 72 hours. Thyroid function studies  Recent Labs  12/29/16 1227  TSH 1.853   Anemia work up  Recent Labs  12/29/16 1227  VITAMINB12 349   Urinalysis    Component Value Date/Time   COLORURINE AMBER (A) 12/28/2016 1353   APPEARANCEUR CLOUDY (A) 12/28/2016 1353   LABSPEC 1.018 12/28/2016 1353   PHURINE 5.0 12/28/2016 1353   GLUCOSEU NEGATIVE 12/28/2016 1353   HGBUR NEGATIVE 12/28/2016 1353   BILIRUBINUR NEGATIVE 12/28/2016 1353   KETONESUR NEGATIVE 12/28/2016 1353   PROTEINUR 30 (A) 12/28/2016 1353   UROBILINOGEN 0.2 03/15/2014 1254   NITRITE NEGATIVE 12/28/2016 1353   LEUKOCYTESUR NEGATIVE 12/28/2016 1353    Sepsis Labs Invalid input(s): PROCALCITONIN,  WBC,  LACTICIDVEN Microbiology Recent Results (from the past 240 hour(s))  Culture, blood (routine x 2)     Status: None (Preliminary result)   Collection Time: 12/28/16  6:20 PM  Result Value Ref Range Status   Specimen Description BLOOD RIGHT FOREARM  Final   Special Requests   Final    BOTTLES DRAWN AEROBIC ONLY Blood Culture results may not be optimal due to an inadequate volume of blood received in culture bottles   Culture NO GROWTH 4 DAYS  Final   Report Status PENDING  Incomplete  Culture, blood (routine x 2)     Status: None (Preliminary result)   Collection Time: 12/28/16  6:26 PM  Result Value Ref Range Status   Specimen Description BLOOD RIGHT WRIST  Final   Special Requests   Final    BOTTLES DRAWN AEROBIC ONLY Blood Culture adequate volume   Culture NO GROWTH 4 DAYS  Final   Report Status PENDING  Incomplete  MRSA PCR Screening     Status: Abnormal   Collection Time: 12/28/16  8:31 PM  Result Value Ref Range Status   MRSA by PCR POSITIVE (A) NEGATIVE Final    Comment: RESULT CALLED TO, READ BACK BY AND VERIFIED  WITH: HEARN,J _0  BY MATTHEWS,B 9.20.18      Time coordinating discharge: Over 30 minutes  SIGNED:   Louellen Molder, MD  [facility] 01/01/2017, 10:05 AM Pager   If 7PM-7AM, please contact night-coverage [email or web address] Password [email or web address]

## 2017-01-01 NOTE — Progress Notes (Addendum)
Pharmacy Antibiotic Note  Jonathan Wise is a 71 y.o. male admitted on 12/28/2016 with sepsis / PNA.  Pharmacy has been consulted for Vancomycin dosing. SrCr has improved, trough at goal.  Plan: Continue vanc to 1gm IV q12 hours (currently lost IV access) Changed to PO Levaquin Monitor labs, micro and vitals.  F/U timing of antibiotics if/when IV access regained.  Height: 6' (182.9 cm) Weight: 294 lb 15.6 oz (133.8 kg) IBW/kg (Calculated) : 77.6  Temp (24hrs), Avg:98.2 F (36.8 C), Min:97.8 F (36.6 C), Max:98.4 F (36.9 C)   Recent Labs Lab 12/28/16 1406 12/28/16 1420 12/28/16 1833 12/28/16 2106 12/29/16 0540 12/30/16 1022 12/31/16 0512 12/31/16 2220 01/01/17 0603  WBC 17.4*  --   --   --  13.4*  --  9.9  --  9.8  CREATININE 2.13*  --   --   --  1.19 0.57* 0.49*  --  0.48*  LATICACIDVEN  --  3.79* 0.56 2.1*  --   --   --   --   --   VANCOTROUGH  --   --   --   --   --   --   --  15  --     Estimated Creatinine Clearance: 119.9 mL/min (A) (by C-G formula based on SCr of 0.48 mg/dL (L)).    Allergies  Allergen Reactions  . Furosemide Other (See Comments)    Dizziness , drowsiness    Antimicrobials this admission: Vanc 9/20 >>  Cefepime 9/20 >> 9/24 Levaquin 9/24 >>  Microbiology results: 9/20 BCx: pending  UCx:    Sputum:    MRSA PCR:(+)   Thank you for allowing pharmacy to be a part of this patient's care.  Mady Gemma 01/01/2017 7:37 AM

## 2017-01-02 LAB — CULTURE, BLOOD (ROUTINE X 2)
CULTURE: NO GROWTH
CULTURE: NO GROWTH
SPECIAL REQUESTS: ADEQUATE

## 2017-07-20 ENCOUNTER — Inpatient Hospital Stay (HOSPITAL_COMMUNITY): Payer: Medicare Other

## 2017-07-20 ENCOUNTER — Other Ambulatory Visit: Payer: Self-pay

## 2017-07-20 ENCOUNTER — Emergency Department (HOSPITAL_COMMUNITY): Payer: Medicare Other

## 2017-07-20 ENCOUNTER — Inpatient Hospital Stay (HOSPITAL_COMMUNITY)
Admission: EM | Admit: 2017-07-20 | Discharge: 2017-07-22 | DRG: 310 | Disposition: A | Discharge status: Home / Self Care | Payer: Medicare Other | Attending: Internal Medicine | Admitting: Internal Medicine

## 2017-07-20 ENCOUNTER — Encounter (HOSPITAL_COMMUNITY): Payer: Self-pay | Admitting: *Deleted

## 2017-07-20 DIAGNOSIS — E1165 Type 2 diabetes mellitus with hyperglycemia: Secondary | ICD-10-CM | POA: Diagnosis present

## 2017-07-20 DIAGNOSIS — J9601 Acute respiratory failure with hypoxia: Secondary | ICD-10-CM | POA: Diagnosis present

## 2017-07-20 DIAGNOSIS — I350 Nonrheumatic aortic (valve) stenosis: Secondary | ICD-10-CM | POA: Diagnosis present

## 2017-07-20 DIAGNOSIS — Z87891 Personal history of nicotine dependence: Secondary | ICD-10-CM | POA: Diagnosis not present

## 2017-07-20 DIAGNOSIS — Z7401 Bed confinement status: Secondary | ICD-10-CM | POA: Diagnosis not present

## 2017-07-20 DIAGNOSIS — H532 Diplopia: Secondary | ICD-10-CM | POA: Diagnosis present

## 2017-07-20 DIAGNOSIS — I2510 Atherosclerotic heart disease of native coronary artery without angina pectoris: Secondary | ICD-10-CM | POA: Diagnosis present

## 2017-07-20 DIAGNOSIS — D638 Anemia in other chronic diseases classified elsewhere: Secondary | ICD-10-CM | POA: Diagnosis present

## 2017-07-20 DIAGNOSIS — R001 Bradycardia, unspecified: Secondary | ICD-10-CM | POA: Diagnosis present

## 2017-07-20 DIAGNOSIS — T447X5A Adverse effect of beta-adrenoreceptor antagonists, initial encounter: Secondary | ICD-10-CM | POA: Diagnosis present

## 2017-07-20 DIAGNOSIS — I10 Essential (primary) hypertension: Secondary | ICD-10-CM | POA: Diagnosis present

## 2017-07-20 DIAGNOSIS — I872 Venous insufficiency (chronic) (peripheral): Secondary | ICD-10-CM | POA: Diagnosis present

## 2017-07-20 DIAGNOSIS — T460X5A Adverse effect of cardiac-stimulant glycosides and drugs of similar action, initial encounter: Secondary | ICD-10-CM | POA: Diagnosis present

## 2017-07-20 DIAGNOSIS — I480 Paroxysmal atrial fibrillation: Secondary | ICD-10-CM | POA: Diagnosis not present

## 2017-07-20 DIAGNOSIS — Z888 Allergy status to other drugs, medicaments and biological substances status: Secondary | ICD-10-CM

## 2017-07-20 DIAGNOSIS — Z7984 Long term (current) use of oral hypoglycemic drugs: Secondary | ICD-10-CM

## 2017-07-20 DIAGNOSIS — T461X5A Adverse effect of calcium-channel blockers, initial encounter: Secondary | ICD-10-CM | POA: Diagnosis present

## 2017-07-20 DIAGNOSIS — Z86718 Personal history of other venous thrombosis and embolism: Secondary | ICD-10-CM

## 2017-07-20 DIAGNOSIS — E785 Hyperlipidemia, unspecified: Secondary | ICD-10-CM | POA: Diagnosis present

## 2017-07-20 DIAGNOSIS — I272 Pulmonary hypertension, unspecified: Secondary | ICD-10-CM | POA: Diagnosis present

## 2017-07-20 DIAGNOSIS — D72829 Elevated white blood cell count, unspecified: Secondary | ICD-10-CM | POA: Diagnosis present

## 2017-07-20 DIAGNOSIS — Z6837 Body mass index (BMI) 37.0-37.9, adult: Secondary | ICD-10-CM

## 2017-07-20 DIAGNOSIS — Z79899 Other long term (current) drug therapy: Secondary | ICD-10-CM | POA: Diagnosis not present

## 2017-07-20 DIAGNOSIS — E119 Type 2 diabetes mellitus without complications: Secondary | ICD-10-CM

## 2017-07-20 DIAGNOSIS — R42 Dizziness and giddiness: Secondary | ICD-10-CM | POA: Diagnosis not present

## 2017-07-20 DIAGNOSIS — I4891 Unspecified atrial fibrillation: Secondary | ICD-10-CM | POA: Diagnosis not present

## 2017-07-20 LAB — BASIC METABOLIC PANEL
Anion gap: 13 (ref 5–15)
BUN: 29 mg/dL — AB (ref 6–20)
CALCIUM: 8.9 mg/dL (ref 8.9–10.3)
CO2: 19 mmol/L — ABNORMAL LOW (ref 22–32)
Chloride: 104 mmol/L (ref 101–111)
Creatinine, Ser: 1.17 mg/dL (ref 0.61–1.24)
GFR calc Af Amer: >60 mL/min (ref >60)
GLUCOSE: 217 mg/dL — AB (ref 65–99)
POTASSIUM: 4.9 mmol/L (ref 3.5–5.1)
SODIUM: 136 mmol/L (ref 135–145)

## 2017-07-20 LAB — CBC
HEMATOCRIT: 39.5 % (ref 39.0–52.0)
Hemoglobin: 12.2 g/dL — ABNORMAL LOW (ref 13.0–17.0)
MCH: 26.3 pg (ref 26.0–34.0)
MCHC: 30.9 g/dL (ref 30.0–36.0)
MCV: 85.1 fL (ref 78.0–100.0)
PLATELETS: 351 10*3/uL (ref 150–400)
RBC: 4.64 MIL/uL (ref 4.22–5.81)
RDW: 15.8 % — AB (ref 11.5–15.5)
WBC: 17.9 10*3/uL — AB (ref 4.0–10.5)

## 2017-07-20 LAB — HEMOGLOBIN A1C
Hgb A1c MFr Bld: 7.4 % — ABNORMAL HIGH (ref 4.8–5.6)
Mean Plasma Glucose: 165.68 mg/dL

## 2017-07-20 LAB — TROPONIN I: Troponin I: <0.03 ng/mL (ref <0.03)

## 2017-07-20 LAB — T4, FREE: Free T4: 1.3 ng/dL — ABNORMAL HIGH (ref 0.61–1.12)

## 2017-07-20 LAB — I-STAT TROPONIN, ED: TROPONIN I, POC: 0 ng/mL (ref 0.00–0.08)

## 2017-07-20 LAB — DIGOXIN LEVEL: DIGOXIN LVL: 0.4 ng/mL — AB (ref 0.8–2.0)

## 2017-07-20 LAB — TSH: TSH: 1.722 u[IU]/mL (ref 0.350–4.500)

## 2017-07-20 LAB — CBG MONITORING, ED: GLUCOSE-CAPILLARY: 135 mg/dL — AB (ref 65–99)

## 2017-07-20 MED ORDER — APIXABAN 2.5 MG PO TABS: 2.5000 mg | ORAL_TABLET | Freq: Two times a day (BID) | ORAL | Status: DC

## 2017-07-20 MED ORDER — INSULIN ASPART 100 UNIT/ML ~~LOC~~ SOLN
0.0000 [IU] | Freq: Three times a day (TID) | SUBCUTANEOUS | Status: DC
  Administered 2017-07-20: 1 [IU] via SUBCUTANEOUS
  Administered 2017-07-21: 2 [IU] via SUBCUTANEOUS
  Administered 2017-07-22: 5 [IU] via SUBCUTANEOUS
  Administered 2017-07-22: 2 [IU] via SUBCUTANEOUS
  Filled 2017-07-20 (×2): qty 1

## 2017-07-20 MED ORDER — ACETAMINOPHEN 650 MG RE SUPP: 650.0000 mg | Freq: Four times a day (QID) | RECTAL | Status: DC | PRN

## 2017-07-20 MED ORDER — ONDANSETRON HCL 4 MG/2ML IJ SOLN: 4.0000 mg | Freq: Four times a day (QID) | INTRAMUSCULAR | Status: DC | PRN

## 2017-07-20 MED ORDER — ATROPINE SULFATE 1 MG/ML IJ SOLN: 1.0000 mg | Freq: Once | INTRAMUSCULAR | Status: DC

## 2017-07-20 MED ORDER — LEVALBUTEROL HCL 0.63 MG/3ML IN NEBU: 0.6300 mg | INHALATION_SOLUTION | Freq: Four times a day (QID) | RESPIRATORY_TRACT | Status: DC | PRN

## 2017-07-20 MED ORDER — ONDANSETRON HCL 4 MG PO TABS: 4.0000 mg | ORAL_TABLET | Freq: Four times a day (QID) | ORAL | Status: DC | PRN

## 2017-07-20 MED ORDER — HYDRALAZINE HCL 20 MG/ML IJ SOLN: 10.0000 mg | Freq: Four times a day (QID) | INTRAMUSCULAR | Status: DC | PRN

## 2017-07-20 MED ORDER — ENOXAPARIN SODIUM 40 MG/0.4ML ~~LOC~~ SOLN: 40.0000 mg | SUBCUTANEOUS | Status: DC

## 2017-07-20 MED ORDER — SODIUM CHLORIDE 0.9 % IV BOLUS
500.0000 mL | Freq: Once | INTRAVENOUS | Status: AC
  Administered 2017-07-20: 500 mL via INTRAVENOUS

## 2017-07-20 MED ORDER — ATROPINE SULFATE 1 MG/10ML IJ SOSY
PREFILLED_SYRINGE | INTRAMUSCULAR | Status: AC
  Administered 2017-07-20: 1 mg
  Filled 2017-07-20: qty 10

## 2017-07-20 MED ORDER — ACETAMINOPHEN 325 MG PO TABS
650.0000 mg | ORAL_TABLET | Freq: Four times a day (QID) | ORAL | Status: DC | PRN
  Administered 2017-07-21: 650 mg via ORAL
  Filled 2017-07-20: qty 2

## 2017-07-20 MED ORDER — APIXABAN 5 MG PO TABS
5.0000 mg | ORAL_TABLET | Freq: Two times a day (BID) | ORAL | Status: DC
  Administered 2017-07-20 – 2017-07-22 (×3): 5 mg via ORAL
  Filled 2017-07-20 (×5): qty 1

## 2017-07-20 NOTE — ED Triage Notes (Signed)
Patient went to his PCP today for a routine check up and they weren't able to detect his pulse however patient is alert and talking. C/o n/v/d this am however hasn't had any since leaving the doctors office. Patient is normally non-ambulatory and uses a wheelchair.

## 2017-07-20 NOTE — ED Notes (Signed)
Ordered Heart Healthy Tray 

## 2017-07-20 NOTE — ED Notes (Signed)
Patient wears adult diapers all the time, large BM , patient is aware  When he needs to have BM instructed patient to call for help and we would assist him. Patient cleaned , patient has buttocks is very red and bleeding in areas sacrum pad applied. And barrier cream applied.

## 2017-07-20 NOTE — ED Notes (Signed)
Pt to xray

## 2017-07-20 NOTE — ED Notes (Signed)
Dinner tray has arrived 

## 2017-07-20 NOTE — ED Provider Notes (Addendum)
[facility] EMERGENCY DEPARTMENT Provider Note   CSN: [id number] Arrival date & time: 07/20/17  1152     History   Chief Complaint Chief Complaint  Patient presents with  . Bradycardia    HPI Jonathan Wise is a 72 y.o. male.  72 year old male presents with acute onset of bradycardia which began just prior to arrival.  Patient does have a history of history of atrial fibrillation and takes digoxin and believes he may have taken a double dose of his medication.  He admits to seeing colors and lights in his vision as well.  He also takes verapamil as well.  States he was in his baseline state of health until this happened.  Denies any associated chest pain or shortness of breath.  As doctor's office patient found to be bradycardic with a pulse rate in the 40s.  He was also hypotensive.  EMS was called and patient given IV fluids.  He has chronic diarrhea which is unchanged but did have one episode of emesis without associated abdominal pain.     Past Medical History:  Diagnosis Date  . Atrial fibrillation (HCC)    On Pradaxa  . Bilateral lower extremity edema   . DVT (deep venous thrombosis) (HCC) 1994   LLE. Completed tx with coumadin  . Hyperlipidemia   . Hypertension   . Kidney stones   . Morbid obesity (HCC)   . Pulmonary artery hypertension (HCC) 10/18/2011  . Type 2 diabetes mellitus (HCC)   . Urinary retention 10/15/2011  . Venous stasis ulcers [facility])     Patient Active Problem List   Diagnosis Date Noted  . Diarrhea 01/01/2017  . Chronic osteoarthritis 01/01/2017  . Lower extremity weakness 01/01/2017  . Septic shock (HCC)   . Pressure injury of skin 12/29/2016  . Hyperkalemia 12/28/2016  . Acute respiratory failure with hypoxia (HCC) 12/28/2016  . AKI (acute kidney injury) (HCC)   . Pneumonia of right lower lobe due to infectious organism (HCC)   . Anemia of chronic disease 12/25/2015  . Candidal intertrigo 12/22/2015  . 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 Volume depletion 12/21/2015  . Bradycardia 03/15/2014  . UTI (urinary tract infection) 03/15/2014  . Leukocytosis 03/15/2014  . Lactic acidosis 03/15/2014  . Atrial fibrillation (HCC) 06/23/2012  . PVC's (premature ventricular contractions) 06/23/2012  . Sepsis due to pneumonia (HCC) 06/22/2012  . Morbid obesity (HCC) 06/22/2012  . Osteoarthritis of right knee 06/22/2012  . Current use of long term anticoagulation 06/22/2012  . Pulmonary artery hypertension (HCC) 10/18/2011  . Metabolic acidosis 10/16/2011  . Urinary retention 10/15/2011  . Cellulitis of left leg 10/14/2011  . Bilateral lower extremity edema 10/14/2011  . Hypotension 10/14/2011  . Acute renal failure (HCC) 10/14/2011  . Venous stasis ulcers (HCC) 10/14/2011  . Hyponatremia 10/14/2011  . Atrial fibrillation with rapid ventricular response (HCC) 10/14/2011  . Type 2 diabetes mellitus without complication (HCC) 10/14/2011    Past Surgical History:  Procedure Laterality Date  . CYSTOSCOPY  10/18/2011   Procedure: CYSTOSCOPY FLEXIBLE;  Surgeon: Ky Barban, MD;  Location: [facility];  Service: Urology;  Laterality: N/A;  . HERNIA REPAIR     Umbilical        Home Medications    Prior to Admission medications   Medication Sig Start Date End Date Taking? Authorizing Provider  Amino Acids-Protein Hydrolys (FEEDING SUPPLEMENT, PRO-STAT SUGAR FREE 64,) LIQD Take 45 mLs by mouth 3 (three) times daily with meals. For  wound healing    [provider]  atorvastatin (LIPITOR) 10 MG tablet Take 10 mg by mouth daily.    [provider]  dabigatran (PRADAXA) 150 MG CAPS capsule Take 1 capsule (150 mg total) by mouth every 12 (twelve) hours. 12/01/15   [name], [name], MD  digoxin (LANOXIN) 0.125 MG tablet Take 1 tablet (0.125 mg total) by mouth daily. 12/28/15   [name], MD  diphenoxylate-atropine (LOMOTIL) 2.5-0.025 MG tablet TAKE 1 TABLET BY MOUTH 4 TIMES A  DAY FOR UP TO 10 DAYS AS NEEDED FOR DIARRHEA 11/26/16   [provider]  Ergocalciferol (VITAMIN D2) 2000 units TABS Take 1 tablet by mouth daily.    [provider]  gabapentin (NEURONTIN) 300 MG capsule Take 300 mg by mouth 3 (three) times daily.    [provider]  guaiFENesin (MUCINEX) 600 MG 12 hr tablet Take 600 mg by mouth 2 (two) times daily.    [provider]  Insulin Degludec (TRESIBA FLEXTOUCH) 200 UNIT/ML SOPN Inject 23 Units into the skin daily at 12 noon. Give in the afternoon.    [provider]  ipratropium-albuterol (DUONEB) 0.5-2.5 (3) MG/3ML SOLN Take 3 mLs by nebulization every 6 (six) hours as needed (congestion).    [provider]  levofloxacin (LEVAQUIN) 750 MG tablet Take 1 tablet (750 mg total) by mouth daily. 01/01/17   [name], [name], MD  metFORMIN (GLUCOPHAGE) 500 MG tablet Take 1,000 mg by mouth 2 (two) times daily with a meal.     [provider]  Multiple Vitamins-Minerals (CERTAGEN PO) Take 1 tablet by mouth daily.    [provider]  saccharomyces boulardii (FLORASTOR) 250 MG capsule Take 1 capsule (250 mg total) by mouth 2 (two) times daily. 01/01/17   [name], [name], MD  silver sulfADIAZINE (SILVADENE) 1 % cream Apply topically daily. 01/01/17   [name], Nishant, MD  verapamil (CALAN) 80 MG tablet Take 80 mg by mouth 3 (three) times daily.    [provider]  vitamin C (ASCORBIC ACID) 500 MG tablet Take 500 mg by mouth 2 (two) times daily.    [provider]  zinc sulfate 220 (50 Zn) MG capsule Take 220 mg by mouth daily.    [provider]    Family History Family History  Problem Relation Age of Onset  . Diabetes Mother   . Emphysema Father     Social History Social History   Tobacco Use  . Smoking status: Former Games developer  . Smokeless tobacco: Never Used  Substance Use Topics  . Alcohol use: No    Alcohol/week: 0.0 oz  . Drug use: No      Allergies   Furosemide   Review of Systems Review of Systems  All other systems reviewed and are negative.    Physical Exam Updated Vital Signs BP (!) 112/96 (BP Location: Right Arm)   Pulse (!) 48   Temp 98.7 F (37.1 C) (Oral)   Resp 16   Ht 1.854 m (6\' 1" )   Wt 129.7 kg (286 lb)   SpO2 97%   BMI 37.73 kg/m   Physical Exam  Constitutional: He is oriented to person, place, and time. He appears well-developed and well-nourished.  Non-toxic appearance. No distress.  HENT:  Head: Normocephalic and atraumatic.  Eyes: Pupils are equal, round, and reactive to light. Conjunctivae, EOM and lids are normal.  Neck: Normal range of motion. Neck supple. No tracheal deviation present. No thyroid mass present.  Cardiovascular:  Normal heart sounds. An irregular rhythm present. Bradycardia present. Exam reveals no gallop.  No murmur heard. Pulmonary/Chest: Effort normal and breath sounds normal. No stridor. No respiratory distress. He has no decreased breath sounds. He has no wheezes. He has no rhonchi. He has no rales.  Abdominal: Soft. Normal appearance and bowel sounds are normal. He exhibits no distension. There is no tenderness. There is no rebound and no CVA tenderness.  Musculoskeletal: Normal range of motion. He exhibits no edema or tenderness.  Neurological: He is alert and oriented to person, place, and time. He has normal strength. No cranial nerve deficit or sensory deficit. GCS eye subscore is 4. GCS verbal subscore is 5. GCS motor subscore is 6.  Skin: Skin is warm and dry. No abrasion and no rash noted.  Psychiatric: He has a normal mood and affect. His speech is normal and behavior is normal.  Nursing note and vitals reviewed.    ED Treatments / Results  Labs (all labs ordered are listed, but only abnormal results are displayed) Labs Reviewed  BASIC METABOLIC PANEL  CBC  DIGOXIN LEVEL  I-STAT TROPONIN, ED    EKG EKG Interpretation  Date/Time:  Friday  July 20 2017 11:58:20 EDT Ventricular Rate:  42 PR Interval:    QRS Duration: 110 QT Interval:  487 QTC Calculation: 407 R Axis:   142 Text Interpretation:  Wandering atrial pacemaker Right axis deviation Borderline repolarization abnormality Confirmed by Lorre Nick ([id number]) on 07/20/2017 12:15:20 PM   Radiology No results found.  Procedures Procedures (including critical care time)  Medications Ordered in ED Medications - No data to display   Initial Impression / Assessment and Plan / ED Course  I have reviewed the triage vital signs and the nursing notes.  Pertinent labs & imaging results that were available during my care of the patient were reviewed by me and considered in my medical decision making (see chart for details).     Patient is in A. fib with slow ventricular rate response.  Blood pressure have been responsive to IV fluids.  He did bradycardia down into the 30s persistently.  Responded well to 1 mg of atropine.  Patient states that he took his normal 2 doses of verapamil this morning with possible extra dose of digoxin.  His this level was noted and not supratherapeutic.  States he did not take his metoprolol this morning.  Because of the patient's bradycardia which still remains in the high 40s as well as labile blood pressure he will require inpatient admission.  Because the patient appears to be better will hold off on giving him calcium or glucagon   CRITICAL CARE Performed by: Toy Baker Total critical care time: 75 minutes Critical care time was exclusive of separately billable procedures and treating other patients. Critical care was necessary to treat or prevent imminent or life-threatening deterioration. Critical care was time spent personally by me on the following activities: development of treatment plan with patient and/or surrogate as well as nursing, discussions with consultants, evaluation of patient's response to treatment, examination of  patient, obtaining history from patient or surrogate, ordering and performing treatments and interventions, ordering and review of laboratory studies, ordering and review of radiographic studies, pulse oximetry and re-evaluation of patient's condition.     Final Clinical Impressions(s) / ED Diagnoses   Final diagnoses:  None    ED Discharge Orders    None       Lorre Nick, MD 07/20/17 1346  Lorre NickAllen, Miaa Latterell, MD 07/20/17 1346

## 2017-07-20 NOTE — ED Notes (Signed)
Dinner tray ordered.

## 2017-07-20 NOTE — H&P (Addendum)
[facility] History and Physical  Jonathan Wise:[id number] DOB: 1945-08-02 DOA: 07/20/2017  Referring physician:   PCP: [name], MD   Chief Complaint:  Bradycardia   HPI:   a 72 year old male with a history of atrial fibrillation on Pradaxa insulin-dependent diabetes, chronic venous stasis ulcers, uses unna boot presents to the ED today, with complaints of dizziness, lightheadedness double vision, unable to see the outlines of distant objects, after taking  An extra dose of verapamil and digoxin by mistake. Patient denied any chest pain shortness of breath. Patient states that he did have a headache at the time of onset of the symptoms,started feeling dizzy right after taking these pills.., Patient called EMS and was brought in and was found to be bradycardic with heart rate in the 30's- 40s. He denied any symptoms of fever, chills, no intercurrent illness. Denied any nausea vomiting abdominal pain. He has chronic bilateral lower extremity edema which is unchanged.initially patient's heart rate was in the 30s. Patient received 1 mg of atropine. ED course BP (!) 112/96 (BP Location: Right Arm)   Pulse (!) 48   Temp 98.7 F (37.1 C) (Oral)   Resp 16   Ht 1.854 m (6\' 1" )   Wt 129.7 kg (286 lb)   SpO2 97%   BMI 37.73 kg/m  Patient found to be in slow atrial fibrillation with a rate of 40 EKG shows ventricular rate of 42 Patient states that he feels better since he has been in the ED denies any persistent visual symptoms. Troponin negative. White count 17.9.digoxin level 0.4. Glucose 217. Chest x-ray no active disease.     Review of Systems: negative for the following  A complete review of systems was performed with pertinent positives as documented in history of present illness    Past Medical History:  Diagnosis Date  . Atrial fibrillation (HCC)    On Pradaxa  . Bilateral lower extremity edema   . DVT (deep venous thrombosis) (HCC) 1994   LLE. Completed tx  with coumadin  . Hyperlipidemia   . Hypertension   . Kidney stones   . Morbid obesity (HCC)   . Pulmonary artery hypertension (HCC) 10/18/2011  . Type 2 diabetes mellitus (HCC)   . Urinary retention 10/15/2011  . Venous stasis ulcers (HCC)      Past Surgical History:  Procedure Laterality Date  . CYSTOSCOPY  10/18/2011   Procedure: CYSTOSCOPY FLEXIBLE;  Surgeon: Ky Barban, MD;  Location: [facility];  Service: Urology;  Laterality: N/A;  . HERNIA REPAIR     Umbilical      Social History:  reports that he has quit smoking. He has never used smokeless tobacco. He reports that he does not drink alcohol or use drugs.   Allergies  Allergen Reactions  . Furosemide Other (See Comments)    Dizziness , drowsiness    Family History  Problem Relation Age of Onset  . Diabetes Mother   . Emphysema Father        Prior to Admission medications   Medication Sig Start Date End Date Taking? Authorizing Provider  Amino Acids-Protein Hydrolys (FEEDING SUPPLEMENT, PRO-STAT SUGAR FREE 64,) LIQD Take 45 mLs by mouth 3 (three) times daily with meals. For wound healing    [provider]  atorvastatin (LIPITOR) 10 MG tablet Take 10 mg by mouth daily.    [provider]  dabigatran (PRADAXA) 150 MG CAPS capsule Take 1 capsule (150 mg total) by mouth every 12 (twelve) hours.  12/01/15   [name], [name], MD  digoxin (LANOXIN) 0.125 MG tablet Take 1 tablet (0.125 mg total) by mouth daily. 12/28/15   [name], MD  diphenoxylate-atropine (LOMOTIL) 2.5-0.025 MG tablet TAKE 1 TABLET BY MOUTH 4 TIMES A DAY FOR UP TO 10 DAYS AS NEEDED FOR DIARRHEA 11/26/16   [provider]  Ergocalciferol (VITAMIN D2) 2000 units TABS Take 1 tablet by mouth daily.    [provider]  gabapentin (NEURONTIN) 300 MG capsule Take 300 mg by mouth 3 (three) times daily.    [provider]  guaiFENesin (MUCINEX) 600 MG 12 hr tablet Take 600 mg by mouth 2 (two) times  daily.    [provider]  Insulin Degludec (TRESIBA FLEXTOUCH) 200 UNIT/ML SOPN Inject 23 Units into the skin daily at 12 noon. Give in the afternoon.    [provider]  ipratropium-albuterol (DUONEB) 0.5-2.5 (3) MG/3ML SOLN Take 3 mLs by nebulization every 6 (six) hours as needed (congestion).    [provider]  levofloxacin (LEVAQUIN) 750 MG tablet Take 1 tablet (750 mg total) by mouth daily. 01/01/17   [name], [name], MD  metFORMIN (GLUCOPHAGE) 500 MG tablet Take 1,000 mg by mouth 2 (two) times daily with a meal.     [provider]  Multiple Vitamins-Minerals (CERTAGEN PO) Take 1 tablet by mouth daily.    [provider]  saccharomyces boulardii (FLORASTOR) 250 MG capsule Take 1 capsule (250 mg total) by mouth 2 (two) times daily. 01/01/17   [name], [name], MD  silver sulfADIAZINE (SILVADENE) 1 % cream Apply topically daily. 01/01/17   [name], Nishant, MD  verapamil (CALAN) 80 MG tablet Take 80 mg by mouth 3 (three) times daily.    [provider]  vitamin C (ASCORBIC ACID) 500 MG tablet Take 500 mg by mouth 2 (two) times daily.    [provider]  zinc sulfate 220 (50 Zn) MG capsule Take 220 mg by mouth daily.    [provider]     Physical Exam: Vitals:   07/20/17 1330 07/20/17 1345 07/20/17 1400 07/20/17 1415  BP: (!) 99/26 (!) 85/41 (!) 92/59 102/68  Pulse: (!) 48 (!) 48 (!) 47 (!) 47  Resp: 19 15 18 16   Temp:      TempSrc:      SpO2: 99% 99% 97% 94%  Weight:      Height:            Vitals:   07/20/17 1330 07/20/17 1345 07/20/17 1400 07/20/17 1415  BP: (!) 99/26 (!) 85/41 (!) 92/59 102/68  Pulse: (!) 48 (!) 48 (!) 47 (!) 47  Resp: 19 15 18 16   Temp:      TempSrc:      SpO2: 99% 99% 97% 94%  Weight:      Height:       Constitutional: NAD, calm, comfortable Eyes: PERRL, lids and conjunctivae normal ENMT: Mucous membranes are moist. Posterior pharynx clear of any exudate or lesions.Normal  dentition.  Neck: normal, supple, no masses, no thyromegaly Respiratory: clear to auscultation bilaterally, no wheezing, no crackles. Normal respiratory effort. No accessory muscle use.  Cardiovascular: HEART RATE IS SLOW, IRREGULARLY IRREGULAR, no murmurs / rubs / gallops.1+ extremity edema. 2+ pedal pulses. No carotid bruits.  Abdomen: no tenderness, no masses palpated. No hepatosplenomegaly. Bowel sounds positive.  Musculoskeletal: no clubbing / cyanosis. No joint deformity upper and lower extremities. Good ROM, no contractures. Normal muscle tone.  Skin: no rashes, lesions, ulcers.  No induration Neurologic: CN 2-12 grossly intact. Sensation intact, DTR normal. Strength 5/5 in all 4.  Psychiatric: Normal judgment and insight. Alert and oriented x 3. Normal mood.     Labs on Admission: I have personally reviewed following labs and imaging studies  CBC: Recent Labs  Lab 07/20/17 1152  WBC 17.9*  HGB 12.2*  HCT 39.5  MCV 85.1  PLT 351    Basic Metabolic Panel: Recent Labs  Lab 07/20/17 1152  NA 136  K 4.9  CL 104  CO2 19*  GLUCOSE 217*  BUN 29*  CREATININE 1.17  CALCIUM 8.9    GFR: Estimated Creatinine Clearance: 80.6 mL/min (by C-G formula based on SCr of 1.17 mg/dL).  Liver Function Tests: No results for input(s): AST, ALT, ALKPHOS, BILITOT, PROT, ALBUMIN in the last 168 hours. No results for input(s): LIPASE, AMYLASE in the last 168 hours. No results for input(s): AMMONIA in the last 168 hours.  Coagulation Profile: No results for input(s): INR, PROTIME in the last 168 hours. No results for input(s): DDIMER in the last 72 hours.  Cardiac Enzymes: No results for input(s): CKTOTAL, CKMB, CKMBINDEX, TROPONINI in the last 168 hours.  BNP (last 3 results) No results for input(s): PROBNP in the last 8760 hours.  HbA1C: No results for input(s): HGBA1C in the last 72 hours. Lab Results  Component Value Date   HGBA1C 5.7 (H) 11/29/2015   HGBA1C 7.7 (H)  03/15/2014   HGBA1C 11.4 (H) 06/22/2012     CBG: No results for input(s): GLUCAP in the last 168 hours.  Lipid Profile: No results for input(s): CHOL, HDL, LDLCALC, TRIG, CHOLHDL, LDLDIRECT in the last 72 hours.  Thyroid Function Tests: No results for input(s): TSH, T4TOTAL, FREET4, T3FREE, THYROIDAB in the last 72 hours.  Anemia Panel: No results for input(s): VITAMINB12, FOLATE, FERRITIN, TIBC, IRON, RETICCTPCT in the last 72 hours.  Urine analysis:    Component Value Date/Time   COLORURINE AMBER (A) 12/28/2016 1353   APPEARANCEUR CLOUDY (A) 12/28/2016 1353   LABSPEC 1.018 12/28/2016 1353   PHURINE 5.0 12/28/2016 1353   GLUCOSEU NEGATIVE 12/28/2016 1353   HGBUR NEGATIVE 12/28/2016 1353   BILIRUBINUR NEGATIVE 12/28/2016 1353   KETONESUR NEGATIVE 12/28/2016 1353   PROTEINUR 30 (A) 12/28/2016 1353   UROBILINOGEN 0.2 03/15/2014 1254   NITRITE NEGATIVE 12/28/2016 1353   LEUKOCYTESUR NEGATIVE 12/28/2016 1353    Sepsis Labs: @LABRCNTIP (procalcitonin:4,lacticidven:4) )No results found for this or any previous visit (from the past 240 hour(s)).       Radiological Exams on Admission: Dg Chest Portable 1 View  Result Date: 07/20/2017 CLINICAL DATA:  Bradycardia. EXAM: PORTABLE CHEST 1 VIEW COMPARISON:  Chest x-ray dated December 28, 2016. FINDINGS: The patient is rotated to the right. Stable cardiomegaly with prominent central pulmonary arteries. No focal consolidation, pleural effusion, or pneumothorax. No acute osseous abnormality. IMPRESSION: No active disease. Electronically Signed   By: Obie Dredge M.D.   On: 07/20/2017 12:33   Dg Chest Portable 1 View  Result Date: 07/20/2017 CLINICAL DATA:  Bradycardia. EXAM: PORTABLE CHEST 1 VIEW COMPARISON:  Chest x-ray dated December 28, 2016. FINDINGS: The patient is rotated to the right. Stable cardiomegaly with prominent central pulmonary arteries. No focal consolidation, pleural effusion, or pneumothorax. No acute osseous  abnormality. IMPRESSION: No active disease. Electronically Signed   By: Obie Dredge M.D.   On: 07/20/2017 12:33      EKG: Independently reviewed. Slow atrial fibrillation  Assessment/Plan Atrial fibrillation with slow ventricular response, likely  secondary to overdose of verapamil and digitoxin, status post IV atropine in the ED Will monitor patient in stepdown Hold calcium channel blocker/digoxin Given the fact that the patient had some visual symptoms, will obtain CT head to rule out intracranial process [hemorrhage/stroke] contributing to his bradycardia Check TSH Digoxin level 0.4 Repeat 2-D echo,last study in 2013 was limited but had a PA pressure of 46 mm Hg Cycle cardiac enzymes,r/o ischemic etiology Patient is currently hemodynamically stable and asymptomatic The bradycardia does not improve in the next 24 hours consider cardiology consultation Patient states that he was in the process of switching from Pradaxa  To Eliquis Therefore will place patient on Eliquis if  CT of the head is negative for intracranial bleed   Leukocytosis secondary patient denies any intercurrent illness Chest x-ray is negative Will obtain UA     Type 2 diabetes mellitus without complication (HCC) Patient is onTresiba and metformin Patient has been started on sliding scale insulin    Morbid obesity (HCC) Body mass index is 37.73 kg/m.      Chronic venous stasis dermatitis Patient has a long-standing history of bilateral chronic venous stasis ulcerations Will get wound care to evaluate Currently patient has Unna boots in place  Dyslipidemia Continue Lipitor    Anemia of chronic disease Baseline hemoglobin is about 12, hemoglobin currently at baseline  DVT prophylaxis: eliquis      Code Status Orders  (From admission, onward)        Start     Ordered   07/20/17 1454  Full code  Continuous     07/20/17 1455       consults called:  Family Communication: Admission,  patients condition and plan of care including tests being ordered have been discussed with the patient  who indicates understanding and agree with the plan and Code Status  Admission status: inpatient    Disposition plan: Further plan will depend as patient's clinical course evolves and further radiologic and laboratory data become available. Likely home when stable   At the time of admission, it appears that the appropriate admission status for this patient is INPATIENT .Thisis judged to be reasonable and necessary in order to provide the required intensity of service to ensure the patient's safetygiven thepresenting symptoms, physical exam findings, and initial radiographic and laboratory data in the context of their chronic comorbidities.   Richarda OverlieNayana Uno Esau MD [facility] Pager [phone]  If 7PM-7AM, please contact night-coverage [email or web address] Password [email or web address]  07/20/2017, 3:12 PM

## 2017-07-21 ENCOUNTER — Inpatient Hospital Stay (HOSPITAL_COMMUNITY): Payer: Medicare Other

## 2017-07-21 DIAGNOSIS — I4891 Unspecified atrial fibrillation: Secondary | ICD-10-CM

## 2017-07-21 DIAGNOSIS — R42 Dizziness and giddiness: Secondary | ICD-10-CM

## 2017-07-21 LAB — CBG MONITORING, ED
Glucose-Capillary: 161 mg/dL — ABNORMAL HIGH (ref 65–99)
Glucose-Capillary: 136 mg/dL — ABNORMAL HIGH (ref 65–99)

## 2017-07-21 LAB — CBC
HCT: 36.4 % — ABNORMAL LOW (ref 39.0–52.0)
Hemoglobin: 11.2 g/dL — ABNORMAL LOW (ref 13.0–17.0)
MCH: 26 pg (ref 26.0–34.0)
MCHC: 30.8 g/dL (ref 30.0–36.0)
MCV: 84.5 fL (ref 78.0–100.0)
PLATELETS: 305 10*3/uL (ref 150–400)
RBC: 4.31 MIL/uL (ref 4.22–5.81)
RDW: 15.6 % — AB (ref 11.5–15.5)
WBC: 10.6 10*3/uL — ABNORMAL HIGH (ref 4.0–10.5)

## 2017-07-21 LAB — COMPREHENSIVE METABOLIC PANEL
ALBUMIN: 2.8 g/dL — AB (ref 3.5–5.0)
ALK PHOS: 71 U/L (ref 38–126)
ALT: 15 U/L — AB (ref 17–63)
AST: 16 U/L (ref 15–41)
Anion gap: 10 (ref 5–15)
BILIRUBIN TOTAL: 1 mg/dL (ref 0.3–1.2)
BUN: 26 mg/dL — AB (ref 6–20)
CO2: 19 mmol/L — AB (ref 22–32)
CREATININE: 0.88 mg/dL (ref 0.61–1.24)
Calcium: 7.8 mg/dL — ABNORMAL LOW (ref 8.9–10.3)
Chloride: 109 mmol/L (ref 101–111)
GFR calc Af Amer: >60 mL/min (ref >60)
GFR calc non Af Amer: >60 mL/min (ref >60)
GLUCOSE: 115 mg/dL — AB (ref 65–99)
Potassium: 3.6 mmol/L (ref 3.5–5.1)
SODIUM: 138 mmol/L (ref 135–145)
Total Protein: 5.8 g/dL — ABNORMAL LOW (ref 6.5–8.1)

## 2017-07-21 LAB — URINALYSIS, ROUTINE W REFLEX MICROSCOPIC
Bilirubin Urine: NEGATIVE
GLUCOSE, UA: NEGATIVE mg/dL
HGB URINE DIPSTICK: NEGATIVE
Ketones, ur: NEGATIVE mg/dL
NITRITE: NEGATIVE
PH: 5 (ref 5.0–8.0)
Protein, ur: NEGATIVE mg/dL
SPECIFIC GRAVITY, URINE: 1.015 (ref 1.005–1.030)

## 2017-07-21 LAB — TROPONIN I

## 2017-07-21 LAB — ECHOCARDIOGRAM COMPLETE
HEIGHTINCHES: 73 in
Weight: 4576 oz

## 2017-07-21 LAB — GLUCOSE, CAPILLARY: Glucose-Capillary: 170 mg/dL — ABNORMAL HIGH (ref 65–99)

## 2017-07-21 LAB — TSH: TSH: 0.72 u[IU]/mL (ref 0.350–4.500)

## 2017-07-21 MED ORDER — IPRATROPIUM-ALBUTEROL 0.5-2.5 (3) MG/3ML IN SOLN: 3.0000 mL | Freq: Four times a day (QID) | RESPIRATORY_TRACT | Status: DC | PRN

## 2017-07-21 MED ORDER — INSULIN GLARGINE 100 UNIT/ML ~~LOC~~ SOLN
20.0000 [IU] | Freq: Every day | SUBCUTANEOUS | Status: DC
  Administered 2017-07-21 – 2017-07-22 (×2): 20 [IU] via SUBCUTANEOUS
  Filled 2017-07-21 (×3): qty 0.2

## 2017-07-21 MED ORDER — VITAMIN D 1000 UNITS PO TABS
2000.0000 [IU] | ORAL_TABLET | Freq: Every day | ORAL | Status: DC
  Administered 2017-07-22: 2000 [IU] via ORAL
  Filled 2017-07-21: qty 2

## 2017-07-21 MED ORDER — METOPROLOL TARTRATE 25 MG PO TABS
25.0000 mg | ORAL_TABLET | Freq: Two times a day (BID) | ORAL | Status: DC
  Administered 2017-07-22 (×2): 25 mg via ORAL
  Filled 2017-07-21 (×2): qty 1

## 2017-07-21 MED ORDER — VITAMIN D2 50 MCG (2000 UT) PO TABS: 1.0000 | ORAL_TABLET | Freq: Every day | ORAL | Status: DC

## 2017-07-21 MED ORDER — SACCHAROMYCES BOULARDII 250 MG PO CAPS
250.0000 mg | ORAL_CAPSULE | Freq: Two times a day (BID) | ORAL | Status: DC
  Administered 2017-07-21 – 2017-07-22 (×3): 250 mg via ORAL
  Filled 2017-07-21 (×4): qty 1

## 2017-07-21 MED ORDER — ATORVASTATIN CALCIUM 10 MG PO TABS
10.0000 mg | ORAL_TABLET | Freq: Every day | ORAL | Status: DC
  Administered 2017-07-21 – 2017-07-22 (×2): 10 mg via ORAL
  Filled 2017-07-21 (×3): qty 1

## 2017-07-21 MED ORDER — GABAPENTIN 100 MG PO CAPS
200.0000 mg | ORAL_CAPSULE | Freq: Three times a day (TID) | ORAL | Status: DC
  Administered 2017-07-21 – 2017-07-22 (×3): 200 mg via ORAL
  Filled 2017-07-21 (×3): qty 2

## 2017-07-21 MED ORDER — VERAPAMIL HCL ER 120 MG PO TBCR
120.0000 mg | EXTENDED_RELEASE_TABLET | Freq: Every day | ORAL | Status: DC
  Administered 2017-07-22 (×2): 120 mg via ORAL
  Filled 2017-07-21 (×2): qty 1

## 2017-07-21 MED ORDER — INSULIN DEGLUDEC 200 UNIT/ML ~~LOC~~ SOPN: 20.0000 [IU] | PEN_INJECTOR | Freq: Every day | SUBCUTANEOUS | Status: DC

## 2017-07-21 NOTE — Progress Notes (Signed)
  Echocardiogram 2D Echocardiogram has been performed.  Jonathan Wise, [name] 07/21/2017, 11:32 AM

## 2017-07-21 NOTE — Consult Note (Signed)
Reason for Consult: Bradycardia  Referring Physician: Reyne Dumas, MD  Jonathan Wise is an 72 y.o. male.  HPI: Patient that I had last seen in 2012, do not have his complete records, was morbidly obese, has had a abnormal stress test but due to morbid obesity was recommended medical therapy.  He is admitted to the hospital with bradycardia.  Patient in his usual state of health, has been bedbound for the past 1.5 years since he had developed ulceration in his leg which is now completely healed.  Since then he has not been able to walk.  Yesterday morning he got up to go to the physician's office for routine check, states that he took extra dose of digoxin by mistake along with verapamil and metoprolol that he normally takes.  After he reached his PCPs office, he felt extremely nauseous, lightheaded and dizzy, he was then transferred to [facility] evaluation.  He was found to have atrial fibrillation with slow ventricular escape, heart rate was between 30bpm to 80-100 bpm.  He was admitted for further evaluation.  Patient states that his symptoms of nausea completely subsided after he got in the EMS and states that he is back to his usual self.  Denies any shortness of breath, denies PND or orthopnea, no chest pain or palpitations, no dizziness or syncope.  His past medical history significant for remote DVT in left leg, chronic bilateral lower extremity edema for which he uses Ace wrap and has been compliant, paroxysmal atrial fibrillation and on Pradaxa, diabetes mellitus on insulin, hypertension, hyperlipidemia and prior morbid obesity.  He is presently losing weight continuously with aggressive dieting.  Past Medical History:  Diagnosis Date  . Atrial fibrillation ([place])    On Pradaxa  . Bilateral lower extremity edema   . DVT (deep venous thrombosis) ([facility]) 1994   LLE. Completed tx with coumadin  . Hyperlipidemia   . Hypertension   . Kidney stones   . Morbid obesity ([place])   .  Pulmonary artery hypertension ([place]) 10/18/2011  . Type 2 diabetes mellitus ([place])   . Urinary retention 10/15/2011  . Venous stasis ulcers ([facility])     Past Surgical History:  Procedure Laterality Date  . CYSTOSCOPY  10/18/2011   Procedure: CYSTOSCOPY FLEXIBLE;  Surgeon: Marissa Nestle, MD;  Location: [facility];  Service: Urology;  Laterality: N/A;  . HERNIA REPAIR     Umbilical    Family History  Problem Relation Age of Onset  . Diabetes Mother   . Emphysema Father     Social History:  reports that he has quit smoking. He has never used smokeless tobacco. He reports that he does not drink alcohol or use drugs.  Allergies:  Allergies  Allergen Reactions  . Furosemide Other (See Comments)    Dizziness , drowsiness     Results for orders placed or performed during the hospital encounter of 07/20/17 (from the past 48 hour(s))  Basic metabolic panel     Status: Abnormal   Collection Time: 07/20/17 11:52 AM  Result Value Ref Range   Sodium 136 135 - 145 mmol/L   Potassium 4.9 3.5 - 5.1 mmol/L   Chloride 104 101 - 111 mmol/L   CO2 19 (L) 22 - 32 mmol/L   Glucose, Bld 217 (H) 65 - 99 mg/dL   BUN 29 (H) 6 - 20 mg/dL   Creatinine, Ser 1.17 0.61 - 1.24 mg/dL   Calcium 8.9 8.9 - 10.3 mg/dL   GFR calc  non Af Amer >60 >60 mL/min   GFR calc Af Amer >60 >60 mL/min    Comment: (NOTE) The eGFR has been calculated using the CKD EPI equation. This calculation has not been validated in all clinical situations. eGFR's persistently <60 mL/min signify possible Chronic Kidney Disease.    Anion gap 13 5 - 15    Comment: Performed at [facility] [address] [place], [place] [address]  CBC     Status: Abnormal   Collection Time: 07/20/17 11:52 AM  Result Value Ref Range   WBC 17.9 (H) 4.0 - 10.5 K/uL   RBC 4.64 4.22 - 5.81 MIL/uL   Hemoglobin 12.2 (L) 13.0 - 17.0 g/dL   HCT 39.5 39.0 - 52.0 %   MCV 85.1 78.0 - 100.0 fL   MCH 26.3 26.0 - 34.0 pg   MCHC 30.9 30.0 - 36.0 g/dL    RDW 15.8 (H) 11.5 - 15.5 %   Platelets 351 150 - 400 K/uL    Comment: Performed at [facility], [place] [address], [place], [place] [address]  Digoxin level     Status: Abnormal   Collection Time: 07/20/17 11:52 AM  Result Value Ref Range   Digoxin Level 0.4 (L) 0.8 - 2.0 ng/mL    Comment: Performed at [facility], [place] [address] [place], [place] [address]  I-stat troponin, ED     Status: None   Collection Time: 07/20/17 12:18 PM  Result Value Ref Range   Troponin i, poc 0.00 0.00 - 0.08 ng/mL   Comment 3            Comment: Due to the release kinetics of cTnI, a negative result within the first hours of the onset of symptoms does not rule out myocardial infarction with certainty. If myocardial infarction is still suspected, repeat the test at appropriate intervals.   Hemoglobin A1c     Status: Abnormal   Collection Time: 07/20/17  5:13 PM  Result Value Ref Range   Hgb A1c MFr Bld 7.4 (H) 4.8 - 5.6 %    Comment: (NOTE) Pre diabetes:          5.7%-6.4% Diabetes:              >6.4% Glycemic control for   <7.0% adults with diabetes    Mean Plasma Glucose 165.68 mg/dL    Comment: Performed at [facility] [address] [place], [place] [address]  TSH     Status: None   Collection Time: 07/20/17  5:13 PM  Result Value Ref Range   TSH 1.722 0.350 - 4.500 uIU/mL    Comment: Performed by a 3rd Generation assay with a functional sensitivity of <=0.01 uIU/mL. Performed at [facility], [place] [address], [place], [place] [address]   T4, free     Status: Abnormal   Collection Time: 07/20/17  5:13 PM  Result Value Ref Range   Free T4 1.30 (H) 0.61 - 1.12 ng/dL    Comment: (NOTE) Biotin ingestion may interfere with free T4 tests. If the results are inconsistent with the TSH level, previous test results, or the clinical presentation, then consider biotin interference. If needed, order repeat testing after stopping biotin. Performed at [facility],  [place] [address], [place], [place] [address]   Troponin I     Status: None   Collection Time: 07/20/17  5:13 PM  Result Value Ref Range   Troponin I <0.03 <0.03  ng/mL    Comment: Performed at [facility], [place] [address], [place], [place] [address]  CBG monitoring, ED     Status: Abnormal   Collection Time: 07/20/17  5:41 PM  Result Value Ref Range   Glucose-Capillary 135 (H) 65 - 99 mg/dL  Troponin I     Status: None   Collection Time: 07/20/17 10:27 PM  Result Value Ref Range   Troponin I <0.03 <0.03 ng/mL    Comment: Performed at [facility] [address], [place], [place] [address]  Troponin I     Status: None   Collection Time: 07/21/17  4:31 AM  Result Value Ref Range   Troponin I <0.03 <0.03 ng/mL    Comment: Performed at [facility] [address] [place], [place] [address]  Comprehensive metabolic panel     Status: Abnormal   Collection Time: 07/21/17  4:31 AM  Result Value Ref Range   Sodium 138 135 - 145 mmol/L   Potassium 3.6 3.5 - 5.1 mmol/L   Chloride 109 101 - 111 mmol/L   CO2 19 (L) 22 - 32 mmol/L   Glucose, Bld 115 (H) 65 - 99 mg/dL   BUN 26 (H) 6 - 20 mg/dL   Creatinine, Ser 0.88 0.61 - 1.24 mg/dL   Calcium 7.8 (L) 8.9 - 10.3 mg/dL   Total Protein 5.8 (L) 6.5 - 8.1 g/dL   Albumin 2.8 (L) 3.5 - 5.0 g/dL   AST 16 15 - 41 U/L   ALT 15 (L) 17 - 63 U/L   Alkaline Phosphatase 71 38 - 126 U/L   Total Bilirubin 1.0 0.3 - 1.2 mg/dL   GFR calc non Af Amer >60 >60 mL/min   GFR calc Af Amer >60 >60 mL/min    Comment: (NOTE) The eGFR has been calculated using the CKD EPI equation. This calculation has not been validated in all clinical situations. eGFR's persistently <60 mL/min signify possible Chronic Kidney Disease.    Anion gap 10 5 - 15    Comment: Performed at [facility] [address] [place], [place] [address]  CBC     Status: Abnormal   Collection Time: 07/21/17  4:31 AM  Result Value Ref Range   WBC 10.6 (H) 4.0 - 10.5 K/uL    RBC 4.31 4.22 - 5.81 MIL/uL   Hemoglobin 11.2 (L) 13.0 - 17.0 g/dL   HCT 36.4 (L) 39.0 - 52.0 %   MCV 84.5 78.0 - 100.0 fL   MCH 26.0 26.0 - 34.0 pg   MCHC 30.8 30.0 - 36.0 g/dL   RDW 15.6 (H) 11.5 - 15.5 %   Platelets 305 150 - 400 K/uL    Comment: Performed at [facility], [place] [address] [place], [place] [address]  Urinalysis, Routine w reflex microscopic     Status: Abnormal   Collection Time: 07/21/17  6:59 AM  Result Value Ref Range   Color, Urine YELLOW YELLOW   APPearance CLEAR CLEAR   Specific Gravity, Urine 1.015 1.005 - 1.030   pH 5.0 5.0 - 8.0   Glucose, UA NEGATIVE NEGATIVE mg/dL   Hgb urine dipstick NEGATIVE NEGATIVE   Bilirubin Urine NEGATIVE NEGATIVE   Ketones, ur NEGATIVE NEGATIVE mg/dL   Protein, ur NEGATIVE NEGATIVE mg/dL   Nitrite NEGATIVE NEGATIVE   Leukocytes, UA TRACE (A) NEGATIVE   RBC / HPF 0-5 0 - 5 RBC/hpf   WBC, UA 0-5 0 - 5 WBC/hpf  Bacteria, UA RARE (A) NONE SEEN   Squamous Epithelial / LPF 0-5 (A) NONE SEEN   Mucus PRESENT    Hyaline Casts, UA PRESENT     Comment: Performed at [facility], [place] [address] [place], [place] [address]  CBG monitoring, ED     Status: Abnormal   Collection Time: 07/21/17  8:31 AM  Result Value Ref Range   Glucose-Capillary 161 (H) 65 - 99 mg/dL   Comment 1 Notify RN    Comment 2 Document in Chart   TSH     Status: None   Collection Time: 07/21/17 10:12 AM  Result Value Ref Range   TSH 0.720 0.350 - 4.500 uIU/mL    Comment: Performed by a 3rd Generation assay with a functional sensitivity of <=0.01 uIU/mL. Performed at [facility], [place] [address] [place], [place] [address]   CBG monitoring, ED     Status: Abnormal   Collection Time: 07/21/17 12:40 PM  Result Value Ref Range   Glucose-Capillary 136 (H) 65 - 99 mg/dL   Comment 1 Notify RN    Comment 2 Document in Chart     Ct Head Wo Contrast  Result Date: 07/20/2017 CLINICAL DATA:  Altered vision. EXAM: CT HEAD WITHOUT CONTRAST  TECHNIQUE: Contiguous axial images were obtained from the base of the skull through the vertex without intravenous contrast. COMPARISON:  CT head dated December 28, 2016. FINDINGS: Brain: No evidence of acute infarction, hemorrhage, hydrocephalus, extra-axial collection or mass lesion/mass effect. Stable atrophy and chronic microvascular ischemic changes. Vascular: Calcified atherosclerosis at the skullbase. No hyperdense vessel. Skull: Negative for fracture or focal lesion. Sinuses/Orbits: No acute finding. Unchanged oval 1.3 cm mass in the medial intraconal left orbit. Similar-appearing 1.6 cm mass in the more posterior medial intraconal right orbit. 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 Other: None. IMPRESSION: 1.  No acute intracranial abnormality. Electronically Signed   By: Titus Dubin M.D.   On: 07/20/2017 15:26   Dg Chest Portable 1 View  Result Date: 07/20/2017 CLINICAL DATA:  Bradycardia. EXAM: PORTABLE CHEST 1 VIEW COMPARISON:  Chest x-ray dated December 28, 2016. FINDINGS: The patient is rotated to the right. Stable cardiomegaly with prominent central pulmonary arteries. No focal consolidation, pleural effusion, or pneumothorax. No acute osseous abnormality. IMPRESSION: No active disease. Electronically Signed   By: Titus Dubin M.D.   On: 07/20/2017 12:33    Review of Systems  Constitutional: Positive for weight loss (Intentional).  HENT: Negative.   Eyes: Negative.   Respiratory: Negative.   Cardiovascular: Positive for leg swelling (Occasional). Negative for chest pain, palpitations, orthopnea and PND.  Gastrointestinal: Negative.   Genitourinary: Negative.   Musculoskeletal:       Bed bound for the past 1.5 years  Skin: Negative.   Neurological: Negative.   Endo/Heme/Allergies: Negative.   Psychiatric/Behavioral: Negative.    Blood pressure (!) 127/105, pulse 95, temperature 97.9 F (36.6 C), temperature source Oral, resp.  rate (!) 25, height 6' 1"  (1.854 m), weight 129.7 kg (286 lb), SpO2 95 %.  Blood pressure 121/64 mmHg. Physical Exam  Constitutional: He is oriented to person, place, and time.  Body mass index is 37.73 kg/m.  Moderately built and moderately obese in no acute distress.   HENT:  Head: Atraumatic.  Eyes: Conjunctivae are normal.  Neck: Normal range of motion. Neck supple.  Cardiovascular:  S1 is variable, S2 is normal.  There is no gallop or murmur.  Respiratory: Effort normal and breath sounds normal.  GI:  Obese, pannus present, bowel sounds present all 4 quadrants, no tenderness or guarding.  Musculoskeletal: Normal range of motion. He exhibits no edema, tenderness or deformity.  Chronic venostasis dermatitis present.  No skin breakthrough.  Skin not examined as patient has it wrapped in Ace wrap.  Neurological: He is alert and oriented to person, place, and time.  Skin: Skin is warm and dry.  Psychiatric: He has a normal mood and affect.   . Current Meds  Medication Sig  . atorvastatin (LIPITOR) 10 MG tablet Take 10 mg by mouth daily.  . dabigatran (PRADAXA) 150 MG CAPS capsule Take 1 capsule (150 mg total) by mouth every 12 (twelve) hours.  . digoxin (LANOXIN) 0.125 MG tablet Take 1 tablet (0.125 mg total) by mouth daily.  Marland Kitchen gabapentin (NEURONTIN) 300 MG capsule Take 300 mg by mouth 3 (three) times daily.  . Insulin Degludec (TRESIBA FLEXTOUCH) 200 UNIT/ML SOPN Inject 23 Units into the skin daily at 12 noon. Give in the afternoon.  Marland Kitchen lisinopril (PRINIVIL,ZESTRIL) 5 MG tablet Take 5 mg by mouth daily.  . metFORMIN (GLUCOPHAGE) 500 MG tablet Take 1,000 mg by mouth 2 (two) times daily with a meal.   . metoprolol tartrate (LOPRESSOR) 25 MG tablet Take 25 mg by mouth daily.  . pravastatin (PRAVACHOL) 20 MG tablet Take 20 mg by mouth daily.  Marland Kitchen saccharomyces boulardii (FLORASTOR) 250 MG capsule Take 1 capsule (250 mg total) by mouth 2 (two) times daily.  . verapamil (CALAN) 80 MG tablet  Take 80 mg by mouth 3 (three) times daily.  . [DISCONTINUED] diphenoxylate-atropine (LOMOTIL) 2.5-0.025 MG tablet TAKE 1 TABLET BY MOUTH 4 TIMES A DAY FOR UP TO 10 DAYS AS NEEDED FOR DIARRHEA   Scheduled Meds: . apixaban  5 mg Oral BID  . atorvastatin  10 mg Oral Daily  . [START ON 07/22/2017] cholecalciferol  2,000 Units Oral Daily  . gabapentin  200 mg Oral TID  . insulin aspart  0-9 Units Subcutaneous TID WC  . insulin glargine  20 Units Subcutaneous Daily  . saccharomyces boulardii  250 mg Oral BID   Continuous Infusions: PRN Meds:.acetaminophen **OR** acetaminophen, ipratropium-albuterol, levalbuterol, ondansetron **OR** ondansetron (ZOFRAN) IV  Telemetry 07/21/2017: Atrial fibrillation with controlled ventricular response at rate of 75- 90 bpm.  EKG 07/20/2017: Probable underlying atrial fibrillation with controlled ventricular response, ventricular rate 48 bpm, normal axis.  Nonspecific T abnormality.  Baseline artifact.  Echocardiogram 07/21/2017: Very poor echo window.  Probable inferior and inferolateral hypokinesis, EF 45-50%.  Mild aortic stenosis with mean gradient of 10 mmHg, valve area of 1.4 cm.  Mild to moderately dilated left atrium, mildly dilated right atrium, mild to moderate pulmonary hypertension, PA pressure 42 mmHg.  Assessment/Plan: 1.  Paroxysmal atrial fibrillation, presently on Pradaxa. CHA2DS2-VASCScore: Risk Score  4,  Yearly risk of stroke  4. Recommendation: ASA No/Anticoagulation Yes 2.  Bradycardia probably related to triple negative chronotropic agents including digoxin, diltiazem and metoprolol. 3.  Abnormal echocardiogram revealing probable inferolateral hypokinesis.  Wall motion abnormality extremely difficult to state due to poor echo window.  May suggest underlying CAD. 4.  Mild aortic stenosis and moderate pulmonary hypertension related to morbid obesity. 5.  Hypertension, controlled 6.  Diabetes mellitus type 2 uncontrolled with hyperglycemia  without complications 7.  Bedbound secondary to deconditioning and leg weakness.  Recommendation: I would recommend restarting  Verapamil, otherwise he will developed A. fib with RVR.  With regard to atrial  fibrillation, he is asymptomatic in view of this, would recommend rate control strategy only.  I do not know how long he has been in A. fib as patient was in A. fib when he presented.  Telemetry reviewed.  He probably has underlying coronary artery disease, again due to his medical comorbidity and also being asymptomatic, would recommend aggressive risk factor modification.  He is on appropriate medical therapy.  Would also recommend starting him on back on small dose of beta-blocker.  Diabetes mellitus needs to be better controlled, he is trying to make more lifestyle changes, he has been trying to lose weight.  With regard to leg edema, he has no edema presently and he uses a Ace wrap regularly and does not have any stasis ulcer.  From cardiac standpoint is stable for discharge.  I will follow him up in the outpatient basis.   Adrian Prows 07/21/2017, 4:38 PM

## 2017-07-21 NOTE — ED Notes (Signed)
Carb modified lunch tray ordered 

## 2017-07-21 NOTE — ED Notes (Signed)
Pt CBG 161, notified Clydie BraunKaren Banker(RN)

## 2017-07-21 NOTE — ED Notes (Signed)
Report called to [facility] RN

## 2017-07-21 NOTE — ED Notes (Addendum)
Pt had a large brown liquid stool  Pt cleaned . Red irritated appearing skin over his buttocks and genitalia .  Pt moved to a clean hospital bed for comfort

## 2017-07-21 NOTE — ED Notes (Signed)
Admitting dr at bedside. Pt is alert/oriented, admits to taking too many digoxin yesterday- "just 1 extra" pt denies any pain.

## 2017-07-21 NOTE — H&P (Signed)
PROGRESS NOTE    Jonathan HibbsRobert L Wise  ZOX:[id number] DOB: [birth year] DOA: 07/20/2017 PCP: Barbie BannerWilson, Fred H, MD  Outpatient Specialists:     Brief Narrative:  Patient is a 72 year old male with a history of atrial fibrillation on Pradaxa insulin-dependent diabetes, pulmonary arterial hypertension, morbid obesity, nephrolithiasis, hypertension, hyperlipidemia, DVT and chronic lower leg edema and venous stasis ulcers, uses unna.  Patient was admitted with symptomatic bradycardia.  Patient presented with dizziness, lightheadedness double vision, unable to see the outlines of distant objects, after taking  An extra dose of verapamil and digoxin by mistake.  Patient's heart rate was said to be in the 30s.  Patient was put in atrial fibrillation.  Patient reported an episode when his heart rate jumped to about 160 bpm last evening. Currently, the patient's heart rate is in the 90s.  The patient is hemodynamically stable.  Cardiology team has been consulted to assist with further assessment and management.   Assessment & Plan:   Active Problems:   Type 2 diabetes mellitus without complication (HCC)   Morbid obesity (HCC)   Bradycardia   Chronic venous stasis dermatitis   Anemia of chronic disease   Acute respiratory failure with hypoxia (HCC)   Atrial fibrillation with slow ventricular response: Possibly related to overdose of verapamil and digitoxin, status post IV atropine in the ED Will monitor patient in stepdown Hold calcium channel blocker/digoxin Given the fact that the patient had some visual symptoms, will obtain CT head to rule out intracranial process [hemorrhage/stroke] contributing to his bradycardia Check TSH Digoxin level 0.4 Repeat 2-D echo,last study in 2013 was limited but had a PA pressure of 46 mm Hg Cycle cardiac enzymes,r/o ischemic etiology Patient is currently hemodynamically stable and asymptomatic The bradycardia does not improve in the next 24 hours consider  cardiology consultation Patient states that he was in the process of switching from Pradaxa  To Eliquis Therefore will place patient on Eliquis if  CT of the head is negative for intracranial bleed  07/21/2017: The heart rate is in the 90s.  Blood pressure is stable.  Patient is asymptomatic.  Cardiology team called, case discussed with cardiology team.  Further management will be as per the cardiology team.   Leukocytosis secondary patient denies any intercurrent illness Chest x-ray is negative Will obtain UA 07/21/2017: Leukocytosis is resolved.     Type 2 diabetes mellitus without complication (HCC) Patient is onTresiba and metformin Patient has been started on sliding scale insulin 07/21/2017: Continue to optimize.    Morbid obesity (HCC) Body mass index is 37.73 kg/m.      Chronic venous stasis dermatitis Patient has a long-standing history of bilateral chronic venous stasis ulcerations Will get wound care to evaluate Currently patient has Unna boots in place  Dyslipidemia Continue Lipitor    Anemia of chronic disease Baseline hemoglobin is about 12, hemoglobin currently at baseline  DVT prophylaxis: eliquis   consults called: Cardiology  Family Communication:Wife by the bedside  Disposition plan: Home eventually  DVT prophylaxis: DOAC Code Status: Full  Antimicrobials:   None   Subjective: No new complaints.  No fever or chills, no shortness of breath, no dizziness.  Objective: Vitals:   07/21/17 0515 07/21/17 0545 07/21/17 0600 07/21/17 0800  BP: 110/67 121/73 (!) 122/108 (!) 142/68  Pulse: 93 94 95 95  Resp: 17 18 17 17   Temp:      TempSrc:      SpO2: 94% 92% 94% 96%  Weight:  Height:        Intake/Output Summary (Last 24 hours) at 07/21/2017 0917 Last data filed at 07/21/2017 0906 Gross per 24 hour  Intake -  Output 1575 ml  Net -1575 ml   Filed Weights   07/20/17 1201  Weight: 129.7 kg (286 lb)     Examination:  General exam: Appears calm and comfortable.  Morbidly obese. Respiratory system: Clear to auscultation. Respiratory effort normal. Cardiovascular system: S1 & S2, irregularly irregular.  Gastrointestinal system: Abdomen is morbidly obese, soft and nontender.  Organs are difficult to assess.  Bowel sounds are present.   Central nervous system: Alert and oriented. No focal neurological deficits. Extremities: Lower legs are wrapped in Unna boots.    Data Reviewed: I have personally reviewed following labs and imaging studies  CBC: Recent Labs  Lab 07/20/17 1152 07/21/17 0431  WBC 17.9* 10.6*  HGB 12.2* 11.2*  HCT 39.5 36.4*  MCV 85.1 84.5  PLT 351 305   Basic Metabolic Panel: Recent Labs  Lab 07/20/17 1152 07/21/17 0431  NA 136 138  K 4.9 3.6  CL 104 109  CO2 19* 19*  GLUCOSE 217* 115*  BUN 29* 26*  CREATININE 1.17 0.88  CALCIUM 8.9 7.8*   GFR: Estimated Creatinine Clearance: 107.1 mL/min (by C-G formula based on SCr of 0.88 mg/dL). Liver Function Tests: Recent Labs  Lab 07/21/17 0431  AST 16  ALT 15*  ALKPHOS 71  BILITOT 1.0  PROT 5.8*  ALBUMIN 2.8*   No results for input(s): LIPASE, AMYLASE in the last 168 hours. No results for input(s): AMMONIA in the last 168 hours. Coagulation Profile: No results for input(s): INR, PROTIME in the last 168 hours. Cardiac Enzymes: Recent Labs  Lab 07/20/17 1713 07/20/17 2227 07/21/17 0431  TROPONINI <0.03 <0.03 <0.03   BNP (last 3 results) No results for input(s): PROBNP in the last 8760 hours. HbA1C: Recent Labs    07/20/17 1713  HGBA1C 7.4*   CBG: Recent Labs  Lab 07/20/17 1741 07/21/17 0831  GLUCAP 135* 161*   Lipid Profile: No results for input(s): CHOL, HDL, LDLCALC, TRIG, CHOLHDL, LDLDIRECT in the last 72 hours. Thyroid Function Tests: Recent Labs    07/20/17 1713  TSH 1.722  FREET4 1.30*   Anemia Panel: No results for input(s): VITAMINB12, FOLATE, FERRITIN, TIBC, IRON,  RETICCTPCT in the last 72 hours. Urine analysis:    Component Value Date/Time   COLORURINE YELLOW 07/21/2017 0659   APPEARANCEUR CLEAR 07/21/2017 0659   LABSPEC 1.015 07/21/2017 0659   PHURINE 5.0 07/21/2017 0659   GLUCOSEU NEGATIVE 07/21/2017 0659   HGBUR NEGATIVE 07/21/2017 0659   BILIRUBINUR NEGATIVE 07/21/2017 0659   KETONESUR NEGATIVE 07/21/2017 0659   PROTEINUR NEGATIVE 07/21/2017 0659   UROBILINOGEN 0.2 03/15/2014 1254   NITRITE NEGATIVE 07/21/2017 0659   LEUKOCYTESUR TRACE (A) 07/21/2017 0659   Sepsis Labs: @LABRCNTIP (procalcitonin:4,lacticidven:4)  )No results found for this or any previous visit (from the past 240 hour(s)).       Radiology Studies: Ct Head Wo Contrast  Result Date: 07/20/2017 CLINICAL DATA:  Altered vision. EXAM: CT HEAD WITHOUT CONTRAST TECHNIQUE: Contiguous axial images were obtained from the base of the skull through the vertex without intravenous contrast. COMPARISON:  CT head dated December 28, 2016. FINDINGS: Brain: No evidence of acute infarction, hemorrhage, hydrocephalus, extra-axial collection or mass lesion/mass effect. Stable atrophy and chronic microvascular ischemic changes. Vascular: Calcified atherosclerosis at the skullbase. No hyperdense vessel. Skull: Negative for fracture or focal lesion. Sinuses/Orbits: No acute finding.  Unchanged oval 1.3 cm mass in the medial intraconal left orbit. Similar-appearing 1.6 cm mass in the more posterior medial intraconal right orbit. 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 Other: None. IMPRESSION: 1.  No acute intracranial abnormality. Electronically Signed   By: Obie Dredge M.D.   On: 07/20/2017 15:26   Dg Chest Portable 1 View  Result Date: 07/20/2017 CLINICAL DATA:  Bradycardia. EXAM: PORTABLE CHEST 1 VIEW COMPARISON:  Chest x-ray dated December 28, 2016. FINDINGS: The patient is rotated to the right. Stable cardiomegaly with prominent central pulmonary  arteries. No focal consolidation, pleural effusion, or pneumothorax. No acute osseous abnormality. IMPRESSION: No active disease. Electronically Signed   By: Obie Dredge M.D.   On: 07/20/2017 12:33        Scheduled Meds: . apixaban  5 mg Oral BID  . atropine  1 mg Intravenous Once  . insulin aspart  0-9 Units Subcutaneous TID WC   Continuous Infusions:   LOS: 1 day    Time spent: Greater than 35 minutes    Berton Mount, MD  [facility] Pager #: [phone] 7PM-7AM contact night coverage as above

## 2017-07-21 NOTE — Progress Notes (Signed)
  Echocardiogram 2D Echocardiogram has been performed.  Roosvelt MaserLane, Davione Lenker F 07/21/2017, 11:33 AM

## 2017-07-22 ENCOUNTER — Other Ambulatory Visit: Payer: Self-pay

## 2017-07-22 DIAGNOSIS — I872 Venous insufficiency (chronic) (peripheral): Secondary | ICD-10-CM

## 2017-07-22 DIAGNOSIS — D638 Anemia in other chronic diseases classified elsewhere: Secondary | ICD-10-CM

## 2017-07-22 DIAGNOSIS — R001 Bradycardia, unspecified: Secondary | ICD-10-CM

## 2017-07-22 DIAGNOSIS — J9601 Acute respiratory failure with hypoxia: Secondary | ICD-10-CM

## 2017-07-22 LAB — GLUCOSE, CAPILLARY
Glucose-Capillary: 145 mg/dL — ABNORMAL HIGH (ref 65–99)
Glucose-Capillary: 183 mg/dL — ABNORMAL HIGH (ref 65–99)
Glucose-Capillary: 274 mg/dL — ABNORMAL HIGH (ref 65–99)

## 2017-07-22 LAB — MRSA PCR SCREENING: MRSA by PCR: POSITIVE — AB

## 2017-07-22 MED ORDER — APIXABAN 5 MG PO TABS: 5.0000 mg | ORAL_TABLET | Freq: Two times a day (BID) | ORAL | 0 refills | Status: DC

## 2017-07-22 MED ORDER — VERAPAMIL HCL ER 120 MG PO TBCR: 120.0000 mg | EXTENDED_RELEASE_TABLET | Freq: Every day | ORAL | 0 refills | Status: DC

## 2017-07-22 MED ORDER — GABAPENTIN 100 MG PO CAPS: 200.0000 mg | ORAL_CAPSULE | Freq: Three times a day (TID) | ORAL | 0 refills | Status: DC

## 2017-07-22 MED ORDER — METOPROLOL TARTRATE 25 MG PO TABS: 25.0000 mg | ORAL_TABLET | Freq: Two times a day (BID) | ORAL | 0 refills | Status: DC

## 2017-07-22 MED ORDER — CHOLECALCIFEROL 50 MCG (2000 UT) PO TABS: 2000.0000 [IU] | ORAL_TABLET | Freq: Every day | ORAL | 0 refills | Status: DC

## 2017-07-22 NOTE — Care Management Note (Signed)
Case Management Note  Patient Details  Name: Ferd HibbsRobert L Knobel MRN: [id number] Date of Birth: 04-01-1946  Subjective/Objective:                 Spoke to patient at bedside. Provided with 30 day Eliquis card. Patient states copay will $45, as a tier 3 drug with his coverage. PTAR packet printed and placed on chart. RN instructed to call when ready for DC. PTAR [phone].    Action/Plan:   Expected Discharge Date:  07/22/17               Expected Discharge Plan:  Home/Self Care  In-House Referral:     Discharge planning Services  CM Consult, Other - See comment, Medication Assistance(PTAR)  Post Acute Care Choice:    Choice offered to:     DME Arranged:    DME Agency:     HH Arranged:    HH Agency:     Status of Service:  Completed, signed off  If discussed at [employer] Length of Stay Meetings, dates discussed:    Additional Comments:  Lawerance SabalDebbie Najae Rathert, RN 07/22/2017, 1:21 PM

## 2017-07-22 NOTE — Progress Notes (Signed)
Subjective:  Remains asymptomatic.  Objective:  Vital Signs in the last 24 hours: Temp:  [97.9 F (36.6 C)-98.6 F (37 C)] 98.6 F (37 C) (04/13 2105) Pulse Rate:  [99] 99 (04/13 2105) Resp:  [20] 20 (04/13 2105) BP: (127-144)/(77-105) 144/77 (04/13 2105) SpO2:  [100 %] 100 % (04/13 2105)  Intake/Output from previous day: 04/13 0701 - 04/14 0700 In: 240 [P.O.:240] Out: 2205 [Urine:2205]  Physical Exam: Blood pressure (!) 144/77, pulse 99, temperature 98.6 F (37 C), temperature source Oral, resp. rate 20, height 6\' 1"  (1.854 m), weight 129.7 kg (286 lb), SpO2 100 %.  Neck: Normal range of motion. Neck supple.  Cardiovascular:  S1 is variable, S2 is normal.  There is no gallop or murmur.  Respiratory: Effort normal and breath sounds normal.  GI:  Obese, pannus present, bowel sounds present all 4 quadrants, no tenderness or guarding.  Musculoskeletal: Normal range of motion. He exhibits no edema, tenderness or deformity.  Chronic venostasis dermatitis present.  No skin breakthrough.  Skin not examined as patient has it wrapped in Ace wrap.  Neurological: He is alert and oriented to person, place, and time.  Skin: Skin is warm and dry.  Psychiatric: He has a normal mood and affect.  Lab Results: BMP Recent Labs    01/01/17 0603 07/20/17 1152 07/21/17 0431  NA 138 136 138  K 3.9 4.9 3.6  CL 96* 104 109  CO2 35* 19* 19*  GLUCOSE 92 217* 115*  BUN 19 29* 26*  CREATININE 0.48* 1.17 0.88  CALCIUM 9.0 8.9 7.8*  GFRNONAA >60 >60 >60  GFRAA >60 >60 >60    CBC Recent Labs  Lab 07/21/17 0431  WBC 10.6*  RBC 4.31  HGB 11.2*  HCT 36.4*  PLT 305  MCV 84.5  MCH 26.0  MCHC 30.8  RDW 15.6*    HEMOGLOBIN A1C Lab Results  Component Value Date   HGBA1C 7.4 (H) 07/20/2017   MPG 165.68 07/20/2017    Cardiac Panel (last 3 results) Recent Labs    07/20/17 1713 07/20/17 2227 07/21/17 0431  TROPONINI <0.03 <0.03 <0.03    TSH Recent Labs    12/29/16 1227  07/20/17 1713 07/21/17 1012  TSH 1.853 1.722 0.720    Lipid Panel     Component Value Date/Time   CHOL 144 06/24/2014 1000   TRIG 140 06/24/2014 1000   HDL 35 (L) 06/24/2014 1000   CHOLHDL 4.1 06/24/2014 1000   VLDL 28 06/24/2014 1000   LDLCALC 81 06/24/2014 1000     Hepatic Function Panel Recent Labs    12/28/16 1406 07/21/17 0431  PROT 7.2 5.8*  ALBUMIN 3.4* 2.8*  AST 32 16  ALT 26 15*  ALKPHOS 80 71  BILITOT 0.7 1.0  BILIDIR 0.2  --   IBILI 0.5  --    Imaging: Ct Head Wo Contrast  Result Date: 07/20/2017 CLINICAL DATA:  Altered vision. EXAM: CT HEAD WITHOUT CONTRAST TECHNIQUE: Contiguous axial images were obtained from the base of the skull through the vertex without intravenous contrast. COMPARISON:  CT head dated December 28, 2016. FINDINGS: Brain: No evidence of acute infarction, hemorrhage, hydrocephalus, extra-axial collection or mass lesion/mass effect. Stable atrophy and chronic microvascular ischemic changes. Vascular: Calcified atherosclerosis at the skullbase. No hyperdense vessel. Skull: Negative for fracture or focal lesion. Sinuses/Orbits: No acute finding. Unchanged oval 1.3 cm mass in the medial intraconal left orbit. Similar-appearing 1.6 cm mass in the more posterior medial intraconal right orbit. 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 Other: None. IMPRESSION: 1.  No acute intracranial abnormality. Electronically Signed   By: Obie DredgeWilliam T Derry M.D.   On: 07/20/2017 15:26    Cardiac Studies: Telemetry 07/22/2017: Atrial fibrillation with controlled ventricular response at rate of 75- 80 bpm. Brief sinus rhythm  EKG 07/20/2017: Probable underlying atrial fibrillation with controlled ventricular response, ventricular rate 48 bpm, normal axis.  Nonspecific T abnormality.  Baseline artifact.  Echocardiogram 07/21/2017: Very poor echo window.  Probable inferior and inferolateral hypokinesis, EF 45-50%.  Mild aortic  stenosis with mean gradient of 10 mmHg, valve area of 1.4 cm.  Mild to moderately dilated left atrium, mildly dilated right atrium, mild to moderate pulmonary hypertension, PA pressure 42 mmHg.  Assessment/Plan:  1.  Paroxysmal atrial fibrillation, presently on Pradaxa. CHA2DS2-VASCScore: Risk Score  4,  Yearly risk of stroke  4.  (CAD, hypertension, diabetes mellitus, age) recommendation: ASA No/Anticoagulation Yes 2.  Bradycardia probably related to triple negative chronotropic agents including digoxin, diltiazem and metoprolol. 3.  Abnormal echocardiogram revealing probable inferolateral hypokinesis.  Wall motion abnormality extremely difficult to state due to poor echo window.  May suggest underlying CAD. 4.  Mild aortic stenosis and moderate pulmonary hypertension related to morbid obesity. 5.  Hypertension, controlled 6.  Diabetes mellitus type 2 uncontrolled with hyperglycemia without complications 7.  Bedbound secondary to deconditioning and leg weakness.  Recommendation: Patient has paroxysmal atrial fibrillation.  However he remains asymptomatic.  He is tolerating verapamil and metoprolol without significant heart block, heart rate is well controlled, hence continue the same.  He does have very mild aortic stenosis.  Echocardiogram also reveals possibility of inferolateral hypokinesis, hence underlying coronary artery disease is very likely however he remains asymptomatic and again continued risk modification with aggressive medical therapy is indicated.  Cardiac markers are negative for myocardial injury.  I have given him the option of following up with me versus following up with Dr. Benedetto GoadFred Wilson his PCP, patient would like to discuss with Dr. Andrey CampanileWilson whether he will need follow-up cardiology.  In view of his medical comorbidity, transportation issues, it is best that he can be followed by his PCP and I will certainly be available if he needs cardiology evaluation both either inpatient  or outpatient services.  His wife is present and I have discussed with him regarding continued rate control strategy with atrial fibrillation in view of patient being asymptomatic.  Yates DecampJay Kewan Mcnease, M.D. 07/22/2017, 12:52 PM [facility] Pager: [phone] Office: [phone] If no answer: [phone]  CC: Benedetto GoadFred Wilson, MD

## 2017-07-22 NOTE — Discharge Summary (Signed)
Discharge Summary  Jonathan Wise BJY:[id number] DOB: 10-18-1945  PCP: [name], MD  Admit date: 07/20/2017 Discharge date: 07/22/2017  Time spent: 25 minutes  Recommendations for Outpatient Follow-up:  1. Follow up with PCP  2. Take your medications as prescribed 3. Fall precaution  Discharge Diagnoses:  Active Hospital Problems   Diagnosis Date Noted  . Acute respiratory failure with hypoxia (HCC) 12/28/2016  . Anemia of chronic disease 12/25/2015  . 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 Bradycardia 03/15/2014  . Morbid obesity (HCC) 06/22/2012  . Type 2 diabetes mellitus without complication (HCC) 10/14/2011    Resolved Hospital Problems  No resolved problems to display.    Discharge Condition: stable   Diet recommendation: Resume previous diet  Vitals:   07/21/17 1405 07/21/17 2105  BP: (!) 127/105 (!) 144/77  Pulse:  99  Resp:  20  Temp: 97.9 F (36.6 C) 98.6 F (37 C)  SpO2:  100%    History of present illness:  Patient is a 72 year old male with a history of atrial fibrillation on Pradaxa insulin-dependent diabetes, pulmonary arterial hypertension, morbid obesity, nephrolithiasis, hypertension, hyperlipidemia, DVT and chronic lower leg edema and venous stasis ulcers, uses unna.  Patient was admitted with symptomatic bradycardia.  Patient presented with dizziness, lightheadedness double vision, unable toseethe outlines of distant objects, after takingAn extradose of verapamil and digoxin by mistake.  Patient's heart rate was said to be in the 30s.  Patient was put in atrial fibrillation.  Patient reported an episode when his heart rate jumped to about 160 bpm last evening. Currently, the patient's heart rate is in the 90s.  The patient is hemodynamically stable.  Cardiology team has been consulted to assist with further assessment and management.  Cardiology followed with the patient and has now signed out. HR is now stable.  On the day of  discharge, the patient was hemodynamically stable. He will need to follow up with cardiology.   Hospital Course:  Active Problems:   Type 2 diabetes mellitus without complication (HCC)   Morbid obesity (HCC)   Bradycardia   Chronic venous stasis dermatitis   Anemia of chronic disease   Acute respiratory failure with hypoxia (HCC)  Atrial fibrillation with slow ventricular response: Possibly related to overdose of verapamil and digitoxin, status post IV atropine in the ED Will monitor patient in stepdown Hold calcium channel blocker/digoxin Dr Jacinto Halim followed Cardiology recommended restarting verapamil for rate control to avoid afib rvr F/u with cardiology outpatient  Leukocytosis secondary patient denies any intercurrent illness Chest x-ray is negative UA negative 07/21/2017: Leukocytosis is resolved.  Type 2 diabetes mellitus without complication (HCC) Patient is onTresiba and metformin Continue home mes Follow up with PCP outpatient  Morbid obesity (HCC) Body mass index is 37.73 kg/m. Weight loss outpatient  Chronic venous stasis dermatitis Patient has a long-standing history of bilateral chronic venous stasis ulcerations Will get wound care to evaluate Currently patient hasUnna bootsin place  Dyslipidemia Continue Lipitor  Anemia of chronic disease Baseline hemoglobin is about 12, hemoglobin currently at baseline  DVT prophylaxis:eliquis    Procedures:  None  Consultations:  Cardiology   Discharge Exam: BP (!) 144/77 (BP Location: Right Arm)   Pulse 99   Temp 98.6 F (37 C) (Oral)   Resp 20   Ht 6\' 1"  (1.854 m)   Wt 129.7 kg (286 lb)   SpO2 100%   BMI 37.73 kg/m   General: 72 yo CM WD WN NAD A&O x 3  Cardiovascular: IRR no rubs or gallops Respiratory: CTA no wheezes or rales  Discharge Instructions You were cared for by a hospitalist during your hospital stay. If you have any questions about your discharge medications or the  care you received while you were in the hospital after you are discharged, you can call the unit and asked to speak with the hospitalist on call if the hospitalist that took care of you is not available. Once you are discharged, your primary care physician will handle any further medical issues. Please note that NO REFILLS for any discharge medications will be authorized once you are discharged, as it is imperative that you return to your primary care physician (or establish a relationship with a primary care physician if you do not have one) for your aftercare needs so that they can reassess your need for medications and monitor your lab values.   Allergies as of 07/22/2017      Reactions   Furosemide Other (See Comments)   Dizziness , drowsiness      Medication List    STOP taking these medications   dabigatran 150 MG Caps capsule Commonly known as:  PRADAXA   digoxin 0.125 MG tablet Commonly known as:  LANOXIN   levofloxacin 750 MG tablet Commonly known as:  LEVAQUIN   pravastatin 20 MG tablet Commonly known as:  PRAVACHOL   saccharomyces boulardii 250 MG capsule Commonly known as:  FLORASTOR   silver sulfADIAZINE 1 % cream Commonly known as:  SILVADENE   verapamil 80 MG tablet Commonly known as:  CALAN Replaced by:  verapamil 120 MG CR tablet     TAKE these medications   apixaban 5 MG Tabs tablet Commonly known as:  ELIQUIS Take 1 tablet (5 mg total) by mouth 2 (two) times daily.   atorvastatin 10 MG tablet Commonly known as:  LIPITOR Take 10 mg by mouth daily.   Cholecalciferol 2000 units Tabs Take 1 tablet (2,000 Units total) by mouth daily. Start taking on:  07/23/2017   gabapentin 100 MG capsule Commonly known as:  NEURONTIN Take 2 capsules (200 mg total) by mouth 3 (three) times daily. What changed:    medication strength  how much to take   lisinopril 5 MG tablet Commonly known as:  PRINIVIL,ZESTRIL Take 5 mg by mouth daily.   metFORMIN 500 MG  tablet Commonly known as:  GLUCOPHAGE Take 1,000 mg by mouth 2 (two) times daily with a meal.   metoprolol tartrate 25 MG tablet Commonly known as:  LOPRESSOR Take 1 tablet (25 mg total) by mouth 2 (two) times daily. What changed:  when to take this   TRESIBA FLEXTOUCH 200 UNIT/ML Sopn Generic drug:  Insulin Degludec Inject 23 Units into the skin daily at 12 noon. Give in the afternoon.   verapamil 120 MG CR tablet Commonly known as:  CALAN-SR Take 1 tablet (120 mg total) by mouth daily. Start taking on:  07/23/2017 Replaces:  verapamil 80 MG tablet      Allergies  Allergen Reactions  . Furosemide Other (See Comments)    Dizziness , drowsiness   Follow-up Information    Barbie BannerWilson, Fred H, MD Follow up in 3 day(s).   Specialty:  Family Medicine Why:  please call office Contact information: [address] [place] [address] [phone]        Yates DecampGanji, Jay, MD Follow up in 3 day(s).   Specialty:  Cardiology Why:  please call office Contact information: [address] [place] [address] [phone]            The results of significant diagnostics from this hospitalization (including imaging, microbiology, ancillary and laboratory) are listed below for reference.    Significant Diagnostic Studies: Ct Head Wo Contrast  Result Date: 07/20/2017 CLINICAL DATA:  Altered vision. EXAM: CT HEAD WITHOUT CONTRAST TECHNIQUE: Contiguous axial images were obtained from the base of the skull through the vertex without intravenous contrast. COMPARISON:  CT head dated December 28, 2016. FINDINGS: Brain: No evidence of acute infarction, hemorrhage, hydrocephalus, extra-axial collection or mass lesion/mass effect. Stable atrophy and chronic microvascular ischemic changes. Vascular: Calcified atherosclerosis at the skullbase. No hyperdense vessel. Skull: Negative for fracture or focal lesion. Sinuses/Orbits: No acute finding. Unchanged oval 1.3 cm mass in the medial  intraconal left orbit. Similar-appearing 1.6 cm mass in the more posterior medial intraconal right orbit. 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 Other: None. IMPRESSION: 1.  No acute intracranial abnormality. Electronically Signed   By: Obie Dredge M.D.   On: 07/20/2017 15:26   Dg Chest Portable 1 View  Result Date: 07/20/2017 CLINICAL DATA:  Bradycardia. EXAM: PORTABLE CHEST 1 VIEW COMPARISON:  Chest x-ray dated December 28, 2016. FINDINGS: The patient is rotated to the right. Stable cardiomegaly with prominent central pulmonary arteries. No focal consolidation, pleural effusion, or pneumothorax. No acute osseous abnormality. IMPRESSION: No active disease. Electronically Signed   By: Obie Dredge M.D.   On: 07/20/2017 12:33    Microbiology: Recent Results (from the past 240 hour(s))  MRSA PCR Screening     Status: Abnormal   Collection Time: 07/22/17  4:35 AM  Result Value Ref Range Status   MRSA by PCR POSITIVE (A) NEGATIVE Final    Comment:        The GeneXpert MRSA Assay (FDA approved for NASAL specimens only), is one component of a comprehensive MRSA colonization surveillance program. It is not intended to diagnose MRSA infection nor to guide or monitor treatment for MRSA infections. RESULT CALLED TO, READ BACK BY AND VERIFIED WITH: IRBY,T RN 0630 07/22/17 MITCHELL,L      Labs: Basic Metabolic Panel: Recent Labs  Lab 07/20/17 1152 07/21/17 0431  NA 136 138  K 4.9 3.6  CL 104 109  CO2 19* 19*  GLUCOSE 217* 115*  BUN 29* 26*  CREATININE 1.17 0.88  CALCIUM 8.9 7.8*   Liver Function Tests: Recent Labs  Lab 07/21/17 0431  AST 16  ALT 15*  ALKPHOS 71  BILITOT 1.0  PROT 5.8*  ALBUMIN 2.8*   No results for input(s): LIPASE, AMYLASE in the last 168 hours. No results for input(s): AMMONIA in the last 168 hours. CBC: Recent Labs  Lab 07/20/17 1152 07/21/17 0431  WBC 17.9* 10.6*  HGB 12.2* 11.2*  HCT 39.5 36.4*    MCV 85.1 84.5  PLT 351 305   Cardiac Enzymes: Recent Labs  Lab 07/20/17 1713 07/20/17 2227 07/21/17 0431  TROPONINI <0.03 <0.03 <0.03   BNP: BNP (last 3 results) No results for input(s): BNP in the last 8760 hours.  ProBNP (last 3 results) No results for input(s): PROBNP in the last 8760 hours.  CBG: Recent Labs  Lab 07/21/17 1240 07/21/17 2227 07/22/17 0618 07/22/17 0834 07/22/17 1113  GLUCAP 136* 170* 145* 183* 274*       Signed:  Darlin Drop, MD [facility] 07/22/2017, 12:54 PM

## 2017-07-22 NOTE — Progress Notes (Signed)
Discharge instructions reviewed with patient.  These included the following:  Discharge medications, MD instructions, Follow-up apts, dietary recommendations, pain management, recommendations for constipation, etc.  Patient transported via [facility] accompanied by two attendants..  To be transported to home with wife.

## 2018-11-09 DEATH — deceased

## 2019-05-13 ENCOUNTER — Inpatient Hospital Stay (HOSPITAL_COMMUNITY)
Admission: EM | Admit: 2019-05-13 | Discharge: 2019-05-19 | DRG: 309 | Disposition: A | Discharge status: Skilled Nursing Facility | Payer: Medicare Other | Attending: Internal Medicine | Admitting: Internal Medicine

## 2019-05-13 ENCOUNTER — Encounter (HOSPITAL_COMMUNITY): Payer: Self-pay

## 2019-05-13 ENCOUNTER — Other Ambulatory Visit: Payer: Self-pay

## 2019-05-13 ENCOUNTER — Emergency Department (HOSPITAL_COMMUNITY): Payer: Medicare Other

## 2019-05-13 DIAGNOSIS — L03116 Cellulitis of left lower limb: Secondary | ICD-10-CM | POA: Diagnosis present

## 2019-05-13 DIAGNOSIS — E876 Hypokalemia: Secondary | ICD-10-CM | POA: Diagnosis present

## 2019-05-13 DIAGNOSIS — E785 Hyperlipidemia, unspecified: Secondary | ICD-10-CM | POA: Diagnosis present

## 2019-05-13 DIAGNOSIS — M1990 Unspecified osteoarthritis, unspecified site: Secondary | ICD-10-CM | POA: Diagnosis present

## 2019-05-13 DIAGNOSIS — D638 Anemia in other chronic diseases classified elsewhere: Secondary | ICD-10-CM | POA: Diagnosis present

## 2019-05-13 DIAGNOSIS — I83018 Varicose veins of right lower extremity with ulcer other part of lower leg: Secondary | ICD-10-CM | POA: Diagnosis present

## 2019-05-13 DIAGNOSIS — Z86718 Personal history of other venous thrombosis and embolism: Secondary | ICD-10-CM

## 2019-05-13 DIAGNOSIS — L89320 Pressure ulcer of left buttock, unstageable: Secondary | ICD-10-CM | POA: Diagnosis present

## 2019-05-13 DIAGNOSIS — Z7401 Bed confinement status: Secondary | ICD-10-CM

## 2019-05-13 DIAGNOSIS — I83014 Varicose veins of right lower extremity with ulcer of heel and midfoot: Secondary | ICD-10-CM | POA: Diagnosis not present

## 2019-05-13 DIAGNOSIS — R531 Weakness: Secondary | ICD-10-CM | POA: Diagnosis present

## 2019-05-13 DIAGNOSIS — I10 Essential (primary) hypertension: Secondary | ICD-10-CM | POA: Diagnosis present

## 2019-05-13 DIAGNOSIS — L89310 Pressure ulcer of right buttock, unstageable: Secondary | ICD-10-CM | POA: Diagnosis present

## 2019-05-13 DIAGNOSIS — Z888 Allergy status to other drugs, medicaments and biological substances status: Secondary | ICD-10-CM

## 2019-05-13 DIAGNOSIS — L03115 Cellulitis of right lower limb: Secondary | ICD-10-CM | POA: Diagnosis present

## 2019-05-13 DIAGNOSIS — I4891 Unspecified atrial fibrillation: Secondary | ICD-10-CM | POA: Diagnosis present

## 2019-05-13 DIAGNOSIS — E119 Type 2 diabetes mellitus without complications: Secondary | ICD-10-CM | POA: Diagnosis not present

## 2019-05-13 DIAGNOSIS — Z79899 Other long term (current) drug therapy: Secondary | ICD-10-CM

## 2019-05-13 DIAGNOSIS — E8770 Fluid overload, unspecified: Secondary | ICD-10-CM | POA: Diagnosis not present

## 2019-05-13 DIAGNOSIS — I2721 Secondary pulmonary arterial hypertension: Secondary | ICD-10-CM | POA: Diagnosis present

## 2019-05-13 DIAGNOSIS — R29898 Other symptoms and signs involving the musculoskeletal system: Secondary | ICD-10-CM | POA: Diagnosis present

## 2019-05-13 DIAGNOSIS — Z833 Family history of diabetes mellitus: Secondary | ICD-10-CM

## 2019-05-13 DIAGNOSIS — Z22322 Carrier or suspected carrier of Methicillin resistant Staphylococcus aureus: Secondary | ICD-10-CM

## 2019-05-13 DIAGNOSIS — R600 Localized edema: Secondary | ICD-10-CM | POA: Diagnosis not present

## 2019-05-13 DIAGNOSIS — Z9119 Patient's noncompliance with other medical treatment and regimen: Secondary | ICD-10-CM

## 2019-05-13 DIAGNOSIS — R5381 Other malaise: Secondary | ICD-10-CM | POA: Diagnosis present

## 2019-05-13 DIAGNOSIS — I83028 Varicose veins of left lower extremity with ulcer other part of lower leg: Secondary | ICD-10-CM | POA: Diagnosis present

## 2019-05-13 DIAGNOSIS — Z794 Long term (current) use of insulin: Secondary | ICD-10-CM

## 2019-05-13 DIAGNOSIS — Z20822 Contact with and (suspected) exposure to covid-19: Secondary | ICD-10-CM | POA: Diagnosis present

## 2019-05-13 DIAGNOSIS — Z6839 Body mass index (BMI) 39.0-39.9, adult: Secondary | ICD-10-CM

## 2019-05-13 DIAGNOSIS — I482 Chronic atrial fibrillation, unspecified: Principal | ICD-10-CM | POA: Diagnosis present

## 2019-05-13 DIAGNOSIS — L97818 Non-pressure chronic ulcer of other part of right lower leg with other specified severity: Secondary | ICD-10-CM | POA: Diagnosis present

## 2019-05-13 DIAGNOSIS — I83009 Varicose veins of unspecified lower extremity with ulcer of unspecified site: Secondary | ICD-10-CM | POA: Diagnosis present

## 2019-05-13 DIAGNOSIS — L97411 Non-pressure chronic ulcer of right heel and midfoot limited to breakdown of skin: Secondary | ICD-10-CM | POA: Diagnosis not present

## 2019-05-13 DIAGNOSIS — E1140 Type 2 diabetes mellitus with diabetic neuropathy, unspecified: Secondary | ICD-10-CM | POA: Diagnosis present

## 2019-05-13 DIAGNOSIS — Z7901 Long term (current) use of anticoagulants: Secondary | ICD-10-CM

## 2019-05-13 DIAGNOSIS — I872 Venous insufficiency (chronic) (peripheral): Secondary | ICD-10-CM | POA: Diagnosis present

## 2019-05-13 DIAGNOSIS — K529 Noninfective gastroenteritis and colitis, unspecified: Secondary | ICD-10-CM | POA: Diagnosis present

## 2019-05-13 DIAGNOSIS — G4733 Obstructive sleep apnea (adult) (pediatric): Secondary | ICD-10-CM | POA: Diagnosis present

## 2019-05-13 DIAGNOSIS — Z87891 Personal history of nicotine dependence: Secondary | ICD-10-CM

## 2019-05-13 DIAGNOSIS — Q809 Congenital ichthyosis, unspecified: Secondary | ICD-10-CM | POA: Diagnosis not present

## 2019-05-13 DIAGNOSIS — L97828 Non-pressure chronic ulcer of other part of left lower leg with other specified severity: Secondary | ICD-10-CM | POA: Diagnosis present

## 2019-05-13 DIAGNOSIS — L97909 Non-pressure chronic ulcer of unspecified part of unspecified lower leg with unspecified severity: Secondary | ICD-10-CM | POA: Diagnosis present

## 2019-05-13 LAB — COMPREHENSIVE METABOLIC PANEL
ALT: 14 U/L (ref 0–44)
AST: 20 U/L (ref 15–41)
Albumin: 2.7 g/dL — ABNORMAL LOW (ref 3.5–5.0)
Alkaline Phosphatase: 63 U/L (ref 38–126)
Anion gap: 10 (ref 5–15)
BUN: 13 mg/dL (ref 8–23)
CO2: 29 mmol/L (ref 22–32)
Calcium: 8.9 mg/dL (ref 8.9–10.3)
Chloride: 101 mmol/L (ref 98–111)
Creatinine, Ser: 0.65 mg/dL (ref 0.61–1.24)
GFR calc Af Amer: >60 mL/min (ref >60)
GFR calc non Af Amer: >60 mL/min (ref >60)
Glucose, Bld: 121 mg/dL — ABNORMAL HIGH (ref 70–99)
Potassium: 3.9 mmol/L (ref 3.5–5.1)
Sodium: 140 mmol/L (ref 135–145)
Total Bilirubin: 1.3 mg/dL — ABNORMAL HIGH (ref 0.3–1.2)
Total Protein: 6.8 g/dL (ref 6.5–8.1)

## 2019-05-13 LAB — CBC WITH DIFFERENTIAL/PLATELET
Abs Immature Granulocytes: 0.06 10*3/uL (ref 0.00–0.07)
Basophils Absolute: 0.1 10*3/uL (ref 0.0–0.1)
Basophils Relative: 0 %
Eosinophils Absolute: 0.1 10*3/uL (ref 0.0–0.5)
Eosinophils Relative: 1 %
HCT: 44.5 % (ref 39.0–52.0)
Hemoglobin: 12.8 g/dL — ABNORMAL LOW (ref 13.0–17.0)
Immature Granulocytes: 0 %
Lymphocytes Relative: 6 %
Lymphs Abs: 1 10*3/uL (ref 0.7–4.0)
MCH: 25.6 pg — ABNORMAL LOW (ref 26.0–34.0)
MCHC: 28.8 g/dL — ABNORMAL LOW (ref 30.0–36.0)
MCV: 89 fL (ref 80.0–100.0)
Monocytes Absolute: 1.3 10*3/uL — ABNORMAL HIGH (ref 0.1–1.0)
Monocytes Relative: 8 %
Neutro Abs: 13.9 10*3/uL — ABNORMAL HIGH (ref 1.7–7.7)
Neutrophils Relative %: 85 %
Platelets: 439 10*3/uL — ABNORMAL HIGH (ref 150–400)
RBC: 5 MIL/uL (ref 4.22–5.81)
RDW: 16.3 % — ABNORMAL HIGH (ref 11.5–15.5)
WBC: 16.3 10*3/uL — ABNORMAL HIGH (ref 4.0–10.5)
nRBC: 0 % (ref 0.0–0.2)

## 2019-05-13 LAB — MAGNESIUM: Magnesium: 2.1 mg/dL (ref 1.7–2.4)

## 2019-05-13 LAB — TSH: TSH: 1.356 u[IU]/mL (ref 0.350–4.500)

## 2019-05-13 LAB — RESPIRATORY PANEL BY RT PCR (FLU A&B, COVID)
Influenza A by PCR: NEGATIVE
Influenza B by PCR: NEGATIVE
SARS Coronavirus 2 by RT PCR: NEGATIVE

## 2019-05-13 LAB — BRAIN NATRIURETIC PEPTIDE: B Natriuretic Peptide: 502 pg/mL — ABNORMAL HIGH (ref 0.0–100.0)

## 2019-05-13 LAB — LACTIC ACID, PLASMA: Lactic Acid, Venous: 1.4 mmol/L (ref 0.5–1.9)

## 2019-05-13 LAB — GLUCOSE, CAPILLARY: Glucose-Capillary: 169 mg/dL — ABNORMAL HIGH (ref 70–99)

## 2019-05-13 LAB — MRSA PCR SCREENING: MRSA by PCR: POSITIVE — AB

## 2019-05-13 MED ORDER — INSULIN GLARGINE 100 UNIT/ML ~~LOC~~ SOLN
23.0000 [IU] | Freq: Every evening | SUBCUTANEOUS | Status: DC
  Administered 2019-05-13 – 2019-05-18 (×6): 23 [IU] via SUBCUTANEOUS
  Filled 2019-05-13 (×7): qty 0.23

## 2019-05-13 MED ORDER — CHLORHEXIDINE GLUCONATE CLOTH 2 % EX PADS
6.0000 | MEDICATED_PAD | Freq: Every day | CUTANEOUS | Status: DC
Start: 2019-05-14 — End: 2019-05-13

## 2019-05-13 MED ORDER — LISINOPRIL 10 MG PO TABS
10.0000 mg | ORAL_TABLET | Freq: Every day | ORAL | Status: DC
  Administered 2019-05-13: 10 mg via ORAL
  Filled 2019-05-13: qty 1

## 2019-05-13 MED ORDER — APIXABAN 5 MG PO TABS
5.0000 mg | ORAL_TABLET | Freq: Two times a day (BID) | ORAL | Status: DC
  Administered 2019-05-13 – 2019-05-19 (×12): 5 mg via ORAL
  Filled 2019-05-13 (×12): qty 1

## 2019-05-13 MED ORDER — POLYETHYLENE GLYCOL 3350 17 G PO PACK: 17.0000 g | PACK | Freq: Every day | ORAL | Status: DC | PRN

## 2019-05-13 MED ORDER — DILTIAZEM HCL 25 MG/5ML IV SOLN
10.0000 mg | Freq: Once | INTRAVENOUS | Status: AC
  Administered 2019-05-13: 13:00:00 10 mg via INTRAVENOUS
  Filled 2019-05-13: qty 5

## 2019-05-13 MED ORDER — ACETAMINOPHEN 650 MG RE SUPP: 650.0000 mg | Freq: Four times a day (QID) | RECTAL | Status: DC | PRN

## 2019-05-13 MED ORDER — ONDANSETRON HCL 4 MG PO TABS: 4.0000 mg | ORAL_TABLET | Freq: Four times a day (QID) | ORAL | Status: DC | PRN

## 2019-05-13 MED ORDER — INSULIN ASPART 100 UNIT/ML ~~LOC~~ SOLN: 0.0000 [IU] | Freq: Every day | SUBCUTANEOUS | Status: DC

## 2019-05-13 MED ORDER — SODIUM CHLORIDE 0.9 % IV SOLN
2.0000 g | INTRAVENOUS | Status: DC
  Administered 2019-05-13 – 2019-05-18 (×6): 2 g via INTRAVENOUS
  Filled 2019-05-13 (×6): qty 20

## 2019-05-13 MED ORDER — METOPROLOL TARTRATE 25 MG PO TABS
25.0000 mg | ORAL_TABLET | Freq: Two times a day (BID) | ORAL | Status: DC
  Administered 2019-05-13 – 2019-05-16 (×6): 25 mg via ORAL
  Filled 2019-05-13 (×6): qty 1

## 2019-05-13 MED ORDER — METOPROLOL TARTRATE 5 MG/5ML IV SOLN
5.0000 mg | Freq: Once | INTRAVENOUS | Status: AC
  Administered 2019-05-13: 15:00:00 5 mg via INTRAVENOUS
  Filled 2019-05-13: qty 5

## 2019-05-13 MED ORDER — ONDANSETRON HCL 4 MG/2ML IJ SOLN: 4.0000 mg | Freq: Four times a day (QID) | INTRAMUSCULAR | Status: DC | PRN

## 2019-05-13 MED ORDER — SODIUM CHLORIDE 0.9 % IV BOLUS
1000.0000 mL | Freq: Once | INTRAVENOUS | Status: AC
  Administered 2019-05-13: 13:00:00 1000 mL via INTRAVENOUS

## 2019-05-13 MED ORDER — INSULIN ASPART 100 UNIT/ML ~~LOC~~ SOLN
0.0000 [IU] | Freq: Three times a day (TID) | SUBCUTANEOUS | Status: DC
  Administered 2019-05-14 – 2019-05-16 (×4): 2 [IU] via SUBCUTANEOUS
  Administered 2019-05-17: 3 [IU] via SUBCUTANEOUS
  Administered 2019-05-17 – 2019-05-18 (×4): 2 [IU] via SUBCUTANEOUS
  Administered 2019-05-19: 3 [IU] via SUBCUTANEOUS
  Administered 2019-05-19: 2 [IU] via SUBCUTANEOUS

## 2019-05-13 MED ORDER — ACETAMINOPHEN 325 MG PO TABS: 650.0000 mg | ORAL_TABLET | Freq: Four times a day (QID) | ORAL | Status: DC | PRN

## 2019-05-13 MED ORDER — VERAPAMIL HCL ER 240 MG PO TBCR
120.0000 mg | EXTENDED_RELEASE_TABLET | Freq: Every day | ORAL | Status: DC
  Administered 2019-05-13 – 2019-05-16 (×4): 120 mg via ORAL
  Filled 2019-05-13 (×6): qty 1

## 2019-05-13 MED ORDER — SODIUM CHLORIDE 0.9 % IV SOLN
100.0000 mg | Freq: Two times a day (BID) | INTRAVENOUS | Status: DC
  Administered 2019-05-13 – 2019-05-17 (×8): 100 mg via INTRAVENOUS
  Filled 2019-05-13 (×15): qty 100

## 2019-05-13 MED ORDER — DILTIAZEM HCL-DEXTROSE 125-5 MG/125ML-% IV SOLN (PREMIX)
5.0000 mg/h | INTRAVENOUS | Status: DC
Start: 2019-05-13 — End: 2019-05-14
  Administered 2019-05-13 (×2): 5 mg/h via INTRAVENOUS
  Filled 2019-05-13 (×2): qty 125

## 2019-05-13 NOTE — ED Triage Notes (Signed)
Pt has a history of cellulitis as well as a possible yeast infection. Pt has been trying to get into a nursing home, but has not been able to. Bilateral lower extremity swelling. Pt alert and oriented. NAD. Is not able to walk.

## 2019-05-13 NOTE — H&P (Signed)
History and Physical    Jonathan Wise VQM:[id number] DOB: 1946-03-24 DOA: 05/13/2019  PCP: [name], MD   Patient coming from: Home  I have personally briefly reviewed patient's old medical records in [facility]  Chief Complaint: Diarrhea, leg ulcers.  HPI: Jonathan Wise is a 74 y.o. male with medical history significant for atrial fibrillation on chronic anticoagulation, diabetes mellitus, pulmonary hypertension, bedridden.  Patient presented to the ED complaining of chronic diarrhea-intermittent over the past year.  Reports episodes almost every 2 weeks.  Reports recent episode started about 4 days ago.  He reports at least 2 loose stools daily, for which she wears adult diapers, he is bedridden and so his wife has to clean him up twice a day.  Between these he still has bowel movements. He reports loud "grumbling" bowel sounds. He tells me has had chronic lower extremity swelling and dealing with chronic ulcers since 2012.  Over the past 4 days he reports worsening of the ulcers on his bilateral lower extremity L > R.,  with pain.  He also reports a wound on his buttock. No fever no chills.  He specifically denies any difficulty breathing or chest pain. Reports compliance prior to today with his Eliquis, diltiazem and other medications.  Unable to tell me if he has been taking metoprolol.   ED Course: Incidentally found to be tachycardic in the 130s, in atrial fibrillation.  Blood pressure systolic 120s to 932I.  Tachypneic.  O2 sats greater than 93% on room air.  10 mg Cardizem bolus given, and GTT started.  With improvement in heart rate to the 90s.  5 mg atropine also given.  1 L bolus given.  Hospitalist admit for atrial fibrillation with RVR, and further management of her above problems.  Review of Systems: As per HPI all other systems reviewed and negative.  Past Medical History:  Diagnosis Date  . Atrial fibrillation (HCC)    On Pradaxa  . Bilateral lower extremity  edema   . DVT (deep venous thrombosis) (HCC) 1994   LLE. Completed tx with coumadin  . Hyperlipidemia   . Hypertension   . Kidney stones   . Morbid obesity (HCC)   . Pulmonary artery hypertension (HCC) 10/18/2011  . Type 2 diabetes mellitus (HCC)   . Urinary retention 10/15/2011  . Venous stasis ulcers (HCC)     Past Surgical History:  Procedure Laterality Date  . CYSTOSCOPY  10/18/2011   Procedure: CYSTOSCOPY FLEXIBLE;  Surgeon: Ky Barban, MD;  Location: [facility];  Service: Urology;  Laterality: N/A;  . HERNIA REPAIR     Umbilical     reports that he has quit smoking. He has never used smokeless tobacco. He reports that he does not drink alcohol or use drugs.  Allergies  Allergen Reactions  . Furosemide Other (See Comments)    Dizziness , drowsiness    Family History  Problem Relation Age of Onset  . Diabetes Mother   . Emphysema Father     Prior to Admission medications   Medication Sig Start Date End Date Taking? Authorizing Provider  apixaban (ELIQUIS) 5 MG TABS tablet Take 1 tablet (5 mg total) by mouth 2 (two) times daily. 07/22/17  Yes Hall, Carole N, DO  Cinnamon 500 MG capsule Take 500 mg by mouth daily.   Yes [provider]  CRANBERRY FRUIT PO Take 1 tablet by mouth daily.   Yes [provider]  dapagliflozin propanediol (FARXIGA) 5 MG TABS  tablet Take 5 mg by mouth daily as needed.    Yes [provider]  Insulin Degludec (TRESIBA FLEXTOUCH) 200 UNIT/ML SOPN Inject 23 Units into the skin every evening.    Yes [provider]  lisinopril (PRINIVIL,ZESTRIL) 5 MG tablet Take 10 mg by mouth daily.  05/28/17  Yes [provider]  metoprolol tartrate (LOPRESSOR) 25 MG tablet Take 1 tablet (25 mg total) by mouth 2 (two) times daily. 07/22/17  Yes Irene Pap N, DO  pravastatin (PRAVACHOL) 40 MG tablet Take 40 mg by mouth daily.   Yes [provider]  verapamil (CALAN-SR) 120 MG CR tablet Take 1 tablet (120 mg  total) by mouth daily. Patient taking differently: Take 120 mg by mouth 3 (three) times daily.  07/23/17  Yes Kayleen Memos, DO    Physical Exam: Vitals:   05/13/19 1400 05/13/19 1430 05/13/19 1500 05/13/19 1530  BP: (!) 136/95 (!) 149/99 134/61 129/62  Pulse: (!) 125 (!) 114 (!) 126 98  Resp: (!) 31 (!) 30 (!) 34 (!) 39  Temp:      SpO2: 95% 95% 93% 93%  Weight:      Height:        Constitutional: NAD, calm, comfortable Vitals:   05/13/19 1400 05/13/19 1430 05/13/19 1500 05/13/19 1530  BP: (!) 136/95 (!) 149/99 134/61 129/62  Pulse: (!) 125 (!) 114 (!) 126 98  Resp: (!) 31 (!) 30 (!) 34 (!) 39  Temp:      SpO2: 95% 95% 93% 93%  Weight:      Height:       Eyes: PERRL, lids and conjunctivae normal ENMT: Mucous membranes are moist.  Neck: normal, supple, no masses, no thyromegaly Respiratory:  Normal respiratory effort. No accessory muscle use.  Cardiovascular: Regular rate and rhythm, chronic swelling to lower extremities, with stasis dermatitis, marked amount of scaling and flaking to skin, 2+ pedal pulses.   Abdomen: no tenderness, panus present, no masses palpated. No hepatosplenomegaly. Bowel sounds positive.  Musculoskeletal: no clubbing / cyanosis. No joint deformity upper and lower extremities.  Skin: Ulcers to bilateral lower extremities-about mid leg, with mild surrounding erythema, unsure if chronic or acute on chronic.  Ulcer to left leg appears purulent.   Left buttock ulcer-inferiorly near left gluteal crease, purulent appearing, also with small but diffuse amount of fecal matter about both buttock areas.  No surrounding swelling erythema or tenderness elicited. Neurologic: Strength bilateral upper extremity 5/5, lower extremities 3/5 bilaterally chronic, patient bedridden.  No gross cranial nerve abnormality Psychiatric: Normal judgment and insight. Alert and oriented x 3. Normal mood.                 Labs on Admission: I have personally reviewed  following labs and imaging studies  CBC: Recent Labs  Lab 05/13/19 1359  WBC 16.3*  NEUTROABS 13.9*  HGB 12.8*  HCT 44.5  MCV 89.0  PLT 469*   Basic Metabolic Panel: Recent Labs  Lab 05/13/19 1359  NA 140  K 3.9  CL 101  CO2 29  GLUCOSE 121*  BUN 13  CREATININE 0.65  CALCIUM 8.9   Liver Function Tests: Recent Labs  Lab 05/13/19 1359  AST 20  ALT 14  ALKPHOS 63  BILITOT 1.3*  PROT 6.8  ALBUMIN 2.7*    Radiological Exams on Admission: DG Chest Portable 1 View  Result Date: 05/13/2019 CLINICAL DATA:  Weakness, bilateral lower extremity edema EXAM: PORTABLE CHEST 1 VIEW COMPARISON:  07/20/2017 FINDINGS:  Single frontal view of the chest demonstrates stable hiatal hernia. Cardiac silhouette is unremarkable. No airspace disease, effusion, or pneumothorax. IMPRESSION: 1. Stable exam, no acute process. Electronically Signed   By: Sharlet Salina M.D.   On: 05/13/2019 13:11    EKG: Independently reviewed.  Atrial fibrillation.  Rate 125.  QTc 418.  Assessment/Plan Active Problems:   Atrial fibrillation with rapid ventricular response (HCC)    Atria fibrillation with RVR- rates initially up to 137 improved with IV Cardizem 10mg  bolus and drip and IV metoprolol 5 mg x 1.  Reports compliance with his medication including Eliquis and diltiazem, ?metoprolol.  Potassium normal at 3.9. -Continue Cardizem, -Resume home diltiazem, metoprolol -Resume home Eliquis -Check TSH, magnesium.  Lower extremity ulcers with possible cellulitis-WBC 16.3, tachycardia meeting SIRS criteria.  No surrounding swelling, induration or significant tenderness.  Ulcers appear to be limited to the skin. -IV ceftriaxone 2 g daily -Add IV doxycycline for MRSA coverage -CBC, BMP a.m. -Wound care consult -Obtain blood cultures -Obtain lactic acid  Chronic diarrhea-1 year, intermittent, aggravating buttock ulcer.  No vomiting no abdominal tenderness.  No colonoscopy on file. -Obtain stool C.  difficile -GI pathogen panel -1 L bolus normal saline given.  Diabetes mellitus-random glucose 121 -Continue home insulin degludec at 23 units daily - HGBA1c - SSI  Bedridden-denies history of stroke. - PT evaluation  DVT prophylaxis: Eliquis Code Status: Full code Family Communication: None at bedside Disposition Plan: Pending improvement in heart rate, treatment of cellulitis.  May need placement. Consults called: None Admission status: Inpatient, stepdown I certify that at the point of admission it is my clinical judgment that the patient will require inpatient hospital care spanning beyond 2 midnights from the point of admission due to high intensity of service, high risk for further deterioration and high frequency of surveillance required. The following factors support the patient status of inpatient: A. fib with RVR requiring intravenous Cardizem drip.   MD [facility]  05/13/2019, 6:13 PM

## 2019-05-13 NOTE — ED Provider Notes (Signed)
[facility] EMERGENCY DEPARTMENT Provider Note   CSN: [id number] Arrival date & time: 05/13/19  1154     History Chief Complaint  Patient presents with  . Cellulitis    Jonathan Wise is a 74 y.o. male.  Patient complains of weakness and mild shortness of breath.  Patient states she has been having diarrhea for a long time.  He states he is bedridden  The history is provided by the patient. No language interpreter was used.  Weakness Severity:  Moderate Onset quality:  Sudden Timing:  Constant Progression:  Worsening Chronicity:  New Context: not alcohol use   Associated symptoms: no abdominal pain, no chest pain, no cough, no diarrhea, no frequency, no headaches and no seizures        Past Medical History:  Diagnosis Date  . Atrial fibrillation (HCC)    On Pradaxa  . Bilateral lower extremity edema   . DVT (deep venous thrombosis) (HCC) 1994   LLE. Completed tx with coumadin  . Hyperlipidemia   . Hypertension   . Kidney stones   . Morbid obesity (HCC)   . Pulmonary artery hypertension (HCC) 10/18/2011  . Type 2 diabetes mellitus (HCC)   . Urinary retention 10/15/2011  . Venous stasis ulcers [facility])     Patient Active Problem List   Diagnosis Date Noted  . Chronic osteoarthritis 01/01/2017  . Lower extremity weakness 01/01/2017  . Anemia of chronic disease 12/25/2015  . Candidal intertrigo 12/22/2015  . 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 Bradycardia 03/15/2014  . Lactic acidosis 03/15/2014  . Atrial fibrillation (HCC) 06/23/2012  . PVC's (premature ventricular contractions) 06/23/2012  . Sepsis due to pneumonia (HCC) 06/22/2012  . Morbid obesity (HCC) 06/22/2012  . Osteoarthritis of right knee 06/22/2012  . Current use of long term anticoagulation 06/22/2012  . Pulmonary artery hypertension (HCC) 10/18/2011  . Bilateral lower extremity edema 10/14/2011  . Hypotension 10/14/2011  . Venous stasis ulcers (HCC) 10/14/2011  . Type 2 diabetes  mellitus without complication (HCC) 10/14/2011    Past Surgical History:  Procedure Laterality Date  . CYSTOSCOPY  10/18/2011   Procedure: CYSTOSCOPY FLEXIBLE;  Surgeon: Ky Barban, MD;  Location: [facility];  Service: Urology;  Laterality: N/A;  . HERNIA REPAIR     Umbilical       Family History  Problem Relation Age of Onset  . Diabetes Mother   . Emphysema Father     Social History   Tobacco Use  . Smoking status: Former Games developer  . Smokeless tobacco: Never Used  Substance Use Topics  . Alcohol use: No    Alcohol/week: 0.0 standard drinks  . Drug use: No    Home Medications Prior to Admission medications   Medication Sig Start Date End Date Taking? Authorizing Provider  apixaban (ELIQUIS) 5 MG TABS tablet Take 1 tablet (5 mg total) by mouth 2 (two) times daily. 07/22/17   Darlin Drop, DO  atorvastatin (LIPITOR) 10 MG tablet Take 10 mg by mouth daily.    [provider]  cholecalciferol 2000 units TABS Take 1 tablet (2,000 Units total) by mouth daily. 07/23/17   Darlin Drop, DO  gabapentin (NEURONTIN) 100 MG capsule Take 2 capsules (200 mg total) by mouth 3 (three) times daily. 07/22/17   Darlin Drop, DO  Insulin Degludec (TRESIBA FLEXTOUCH) 200 UNIT/ML SOPN Inject 23 Units into the skin daily at 12 noon. Give in the afternoon.    [provider]  lisinopril (PRINIVIL,ZESTRIL) 5  MG tablet Take 5 mg by mouth daily. 05/28/17   [provider]  metFORMIN (GLUCOPHAGE) 500 MG tablet Take 1,000 mg by mouth 2 (two) times daily with a meal.     [provider]  metoprolol tartrate (LOPRESSOR) 25 MG tablet Take 1 tablet (25 mg total) by mouth 2 (two) times daily. 07/22/17   Darlin Drop, DO  verapamil (CALAN-SR) 120 MG CR tablet Take 1 tablet (120 mg total) by mouth daily. 07/23/17   Darlin Drop, DO    Allergies    Furosemide  Review of Systems   Review of Systems  Constitutional: Negative for appetite change and fatigue.  HENT:  Negative for congestion, ear discharge and sinus pressure.   Eyes: Negative for discharge.  Respiratory: Negative for cough.   Cardiovascular: Negative for chest pain.  Gastrointestinal: Negative for abdominal pain and diarrhea.  Genitourinary: Negative for frequency and hematuria.  Musculoskeletal: Negative for back pain.  Skin: Negative for rash.  Neurological: Positive for weakness. Negative for seizures and headaches.  Psychiatric/Behavioral: Negative for hallucinations.    Physical Exam Updated Vital Signs BP (!) 149/99   Pulse (!) 114   Temp 97.6 F (36.4 C)   Resp (!) 30   Ht 6\' 1"  (1.854 m)   Wt (!) 136.7 kg   SpO2 95%   BMI 39.75 kg/m   Physical Exam Vitals and nursing note reviewed.  Constitutional:      Appearance: He is well-developed.  HENT:     Head: Normocephalic.     Nose: Nose normal.  Eyes:     General: No scleral icterus.    Conjunctiva/sclera: Conjunctivae normal.  Neck:     Thyroid: No thyromegaly.  Cardiovascular:     Rate and Rhythm: Tachycardia present. Rhythm irregular.     Heart sounds: No murmur. No friction rub. No gallop.   Pulmonary:     Breath sounds: No stridor. No wheezing or rales.  Chest:     Chest wall: No tenderness.  Abdominal:     General: There is no distension.     Tenderness: There is no abdominal tenderness. There is no rebound.  Musculoskeletal:        General: Normal range of motion.     Cervical back: Neck supple.  Lymphadenopathy:     Cervical: No cervical adenopathy.  Skin:    Findings: No erythema or rash.  Neurological:     Mental Status: He is alert and oriented to person, place, and time.     Motor: No abnormal muscle tone.     Coordination: Coordination normal.  Psychiatric:        Behavior: Behavior normal.     ED Results / Procedures / Treatments   Labs (all labs ordered are listed, but only abnormal results are displayed) Labs Reviewed  CBC WITH DIFFERENTIAL/PLATELET - Abnormal; Notable for the  following components:      Result Value   WBC 16.3 (*)    Hemoglobin 12.8 (*)    MCH 25.6 (*)    MCHC 28.8 (*)    RDW 16.3 (*)    Platelets 439 (*)    Neutro Abs 13.9 (*)    Monocytes Absolute 1.3 (*)    All other components within normal limits  COMPREHENSIVE METABOLIC PANEL - Abnormal; Notable for the following components:   Glucose, Bld 121 (*)    Albumin 2.7 (*)    Total Bilirubin 1.3 (*)    All other components within normal limits  BRAIN NATRIURETIC PEPTIDE - Abnormal; Notable for the following components:   B Natriuretic Peptide 502.0 (*)    All other components within normal limits  RESPIRATORY PANEL BY RT PCR (FLU A&B, COVID)    EKG None  Radiology DG Chest Portable 1 View  Result Date: 05/13/2019 CLINICAL DATA:  Weakness, bilateral lower extremity edema EXAM: PORTABLE CHEST 1 VIEW COMPARISON:  07/20/2017 FINDINGS: Single frontal view of the chest demonstrates stable hiatal hernia. Cardiac silhouette is unremarkable. No airspace disease, effusion, or pneumothorax. IMPRESSION: 1. Stable exam, no acute process. Electronically Signed   By: Randa Ngo M.D.   On: 05/13/2019 13:11    Procedures Procedures (including critical care time)  Medications Ordered in ED Medications  diltiazem (CARDIZEM) 125 mg in dextrose 5% 125 mL (1 mg/mL) infusion (10 mg/hr Intravenous Rate/Dose Change 05/13/19 1350)  sodium chloride 0.9 % bolus 1,000 mL (1,000 mLs Intravenous New Bag/Given 05/13/19 1325)  diltiazem (CARDIZEM) injection 10 mg (10 mg Intravenous Given 05/13/19 1324)  metoprolol tartrate (LOPRESSOR) injection 5 mg (5 mg Intravenous Given 05/13/19 1516)    ED Course  I have reviewed the triage vital signs and the nursing notes.  Pertinent labs & imaging results that were available during my care of the patient were reviewed by me and considered in my medical decision making (see chart for details).  Clinical Course as of May 13 1519  Tue May 13, 2019  1305 EKG 12-Lead [KF]      Clinical Course User Index [KF] Sherlene Shams, Talitha Givens   MDM Rules/Calculators/A&P                      CRITICAL CARE Performed by: Milton Ferguson Total critical care time: 45 minutes Critical care time was exclusive of separately billable procedures and treating other patients. Critical care was necessary to treat or prevent imminent or life-threatening deterioration. Critical care was time spent personally by me on the following activities: development of treatment plan with patient and/or surrogate as well as nursing, discussions with consultants, evaluation of patient's response to treatment, examination of patient, obtaining history from patient or surrogate, ordering and performing treatments and interventions, ordering and review of laboratory studies, ordering and review of radiographic studies, pulse oximetry and re-evaluation of patient's condition. Patient with rapid atrial fib will be admitted Final Clinical Impression(s) / ED Diagnoses Final diagnoses:  None    Rx / DC Orders ED Discharge Orders    None       Milton Ferguson, MD 05/13/19 1524

## 2019-05-14 DIAGNOSIS — Z794 Long term (current) use of insulin: Secondary | ICD-10-CM

## 2019-05-14 DIAGNOSIS — M1990 Unspecified osteoarthritis, unspecified site: Secondary | ICD-10-CM

## 2019-05-14 DIAGNOSIS — E119 Type 2 diabetes mellitus without complications: Secondary | ICD-10-CM

## 2019-05-14 DIAGNOSIS — Z87891 Personal history of nicotine dependence: Secondary | ICD-10-CM

## 2019-05-14 DIAGNOSIS — R600 Localized edema: Secondary | ICD-10-CM

## 2019-05-14 DIAGNOSIS — L03116 Cellulitis of left lower limb: Secondary | ICD-10-CM

## 2019-05-14 DIAGNOSIS — E1140 Type 2 diabetes mellitus with diabetic neuropathy, unspecified: Secondary | ICD-10-CM | POA: Diagnosis present

## 2019-05-14 DIAGNOSIS — I2721 Secondary pulmonary arterial hypertension: Secondary | ICD-10-CM

## 2019-05-14 DIAGNOSIS — I872 Venous insufficiency (chronic) (peripheral): Secondary | ICD-10-CM

## 2019-05-14 DIAGNOSIS — Z7401 Bed confinement status: Secondary | ICD-10-CM

## 2019-05-14 DIAGNOSIS — Q809 Congenital ichthyosis, unspecified: Secondary | ICD-10-CM

## 2019-05-14 LAB — CBC
HCT: 40.3 % (ref 39.0–52.0)
Hemoglobin: 11.5 g/dL — ABNORMAL LOW (ref 13.0–17.0)
MCH: 25.3 pg — ABNORMAL LOW (ref 26.0–34.0)
MCHC: 28.5 g/dL — ABNORMAL LOW (ref 30.0–36.0)
MCV: 88.8 fL (ref 80.0–100.0)
Platelets: 410 10*3/uL — ABNORMAL HIGH (ref 150–400)
RBC: 4.54 MIL/uL (ref 4.22–5.81)
RDW: 16.1 % — ABNORMAL HIGH (ref 11.5–15.5)
WBC: 11.1 10*3/uL — ABNORMAL HIGH (ref 4.0–10.5)
nRBC: 0 % (ref 0.0–0.2)

## 2019-05-14 LAB — HEMOGLOBIN A1C
Hgb A1c MFr Bld: 5.9 % — ABNORMAL HIGH (ref 4.8–5.6)
Mean Plasma Glucose: 122.63 mg/dL

## 2019-05-14 LAB — BASIC METABOLIC PANEL
Anion gap: 10 (ref 5–15)
BUN: 12 mg/dL (ref 8–23)
CO2: 29 mmol/L (ref 22–32)
Calcium: 8.4 mg/dL — ABNORMAL LOW (ref 8.9–10.3)
Chloride: 102 mmol/L (ref 98–111)
Creatinine, Ser: 0.58 mg/dL — ABNORMAL LOW (ref 0.61–1.24)
GFR calc Af Amer: >60 mL/min (ref >60)
GFR calc non Af Amer: >60 mL/min (ref >60)
Glucose, Bld: 133 mg/dL — ABNORMAL HIGH (ref 70–99)
Potassium: 3.3 mmol/L — ABNORMAL LOW (ref 3.5–5.1)
Sodium: 141 mmol/L (ref 135–145)

## 2019-05-14 LAB — GLUCOSE, CAPILLARY
Glucose-Capillary: 112 mg/dL — ABNORMAL HIGH (ref 70–99)
Glucose-Capillary: 150 mg/dL — ABNORMAL HIGH (ref 70–99)
Glucose-Capillary: 105 mg/dL — ABNORMAL HIGH (ref 70–99)
Glucose-Capillary: 180 mg/dL — ABNORMAL HIGH (ref 70–99)

## 2019-05-14 MED ORDER — MUPIROCIN 2 % EX OINT
1.0000 "application " | TOPICAL_OINTMENT | Freq: Two times a day (BID) | CUTANEOUS | Status: AC
  Administered 2019-05-14 – 2019-05-18 (×10): 1 via NASAL
  Filled 2019-05-14: qty 22

## 2019-05-14 MED ORDER — POTASSIUM CHLORIDE CRYS ER 20 MEQ PO TBCR
40.0000 meq | EXTENDED_RELEASE_TABLET | Freq: Once | ORAL | Status: AC
  Administered 2019-05-14: 40 meq via ORAL
  Filled 2019-05-14: qty 2

## 2019-05-14 MED ORDER — GERHARDT'S BUTT CREAM
TOPICAL_CREAM | Freq: Two times a day (BID) | CUTANEOUS | Status: DC
  Administered 2019-05-18: 1 via TOPICAL
  Filled 2019-05-14: qty 1

## 2019-05-14 MED ORDER — HYDROCERIN EX CREA
TOPICAL_CREAM | Freq: Two times a day (BID) | CUTANEOUS | Status: DC
  Filled 2019-05-14: qty 113

## 2019-05-14 MED ORDER — CHLORHEXIDINE GLUCONATE CLOTH 2 % EX PADS
6.0000 | MEDICATED_PAD | Freq: Every day | CUTANEOUS | Status: AC
  Administered 2019-05-14 – 2019-05-18 (×4): 6 via TOPICAL

## 2019-05-14 MED ORDER — COLLAGENASE 250 UNIT/GM EX OINT
TOPICAL_OINTMENT | Freq: Every day | CUTANEOUS | Status: DC
  Filled 2019-05-14 (×2): qty 30

## 2019-05-14 MED ORDER — CHLORHEXIDINE GLUCONATE CLOTH 2 % EX PADS
6.0000 | MEDICATED_PAD | Freq: Every day | CUTANEOUS | Status: DC
  Administered 2019-05-14 – 2019-05-18 (×5): 6 via TOPICAL

## 2019-05-14 NOTE — Plan of Care (Signed)
  Problem: Acute Rehab PT Goals(only PT should resolve) Goal: Pt will Roll Supine to Side Outcome: Progressing Flowsheets (Taken 05/14/2019 1208) Pt will Roll Supine to Side: with mod assist Goal: Pt Will Go Supine/Side To Sit Outcome: Progressing Flowsheets (Taken 05/14/2019 1208) Pt will go Supine/Side to Sit: with moderate assist Goal: Pt Will Go Sit To Supine/Side Outcome: Progressing Flowsheets (Taken 05/14/2019 1208) Pt will go Sit to Supine/Side: with moderate assist Goal: Patient Will Perform Sitting Balance Outcome: Progressing Flowsheets (Taken 05/14/2019 1208) Patient will perform sitting balance: with minimal assist   12:09 PM, 05/14/19 Ocie Bob, MPT Physical Therapist with [facility] 336 [phone] office [phone] mobile phone

## 2019-05-14 NOTE — Evaluation (Signed)
Physical Therapy Evaluation Patient Details Name: Jonathan Wise MRN: [id number] DOB: 09-Jul-1945 Today's Date: 05/14/2019   History of Present Illness  Jonathan Wise is a 74 y.o. male with medical history significant for atrial fibrillation on chronic anticoagulation, diabetes mellitus, pulmonary hypertension, bedridden.  Patient presented to the ED complaining of chronic diarrhea-intermittent over the past year.  Reports episodes almost every 2 weeks.  Reports recent episode started about 4 days ago.  He reports at least 2 loose stools daily, for which she wears adult diapers, he is bedridden and so his wife has to clean him up twice a day.  Between these he still has bowel movements. He reports loud "grumbling" bowel sounds.He tells me has had chronic lower extremity swelling and dealing with chronic ulcers since 2012.  Over the past 4 days he reports worsening of the ulcers on his bilateral lower extremity L > R.,  with pain.  He also reports a wound on his buttock.No fever no chills.  He specifically denies any difficulty breathing or chest pain.Reports compliance prior to today with his Eliquis, diltiazem and other medications.  Unable to tell me if he has been taking metoprolol.    Clinical Impression  Patient demonstrates slow labored movement for rolling side to side and poor trunk control with falling backwards when attempting to long sit in bed.  Patient has to hold on rolls to maintain long sitting position and declined to attempt sitting up at bedside due to c/o discomfort form buttock dressing sticking to bed - RN notified.  Patient will benefit from continued physical therapy in hospital and recommended venue below to increase strength, balance, endurance for safe ADLs and gait.    Follow Up Recommendations SNF    Equipment Recommendations  None recommended by PT    Recommendations for Other Services       Precautions / Restrictions Precautions Precautions:  Fall Restrictions Weight Bearing Restrictions: No      Mobility  Bed Mobility Overal bed mobility: Needs Assistance Bed Mobility: Supine to Sit;Rolling Rolling: Mod assist   Supine to sit: Mod assist     General bed mobility comments: had to use BUE holding onto bed rails to long sit in bed  Transfers                    Ambulation/Gait                Stairs            Wheelchair Mobility    Modified Rankin (Stroke Patients Only)       Balance Overall balance assessment: Needs assistance Sitting-balance support: Feet unsupported;Bilateral upper extremity supported Sitting balance-Leahy Scale: Poor Sitting balance - Comments: falls backwards when not holding on to bed rails while long sitting Postural control: Posterior lean                                   Pertinent Vitals/Pain Pain Assessment: No/denies pain    Home Living Family/patient expects to be discharged to:: Private residence Living Arrangements: Spouse/significant other Available Help at Discharge: Family;Available 24 hours/day Type of Home: House Home Access: Ramped entrance     Home Layout: One level Home Equipment: Walker - 2 wheels;Bedside commode;Cane - single point;Wheelchair - manual;Other (comment)(has hoyer lift) Additional Comments: has Nurse, adult, lift chair    Prior Function Level of Independence: Needs assistance   Gait / Transfers  Assistance Needed: nonambulatory, uses hoyer lift for bed to wheelchair/chair transfers, assisted rolling side to side and could partially stand using lift chair when being cleaned by spouse  ADL's / Homemaking Assistance Needed: assisted by spouse        Hand Dominance   Dominant Hand: Right    Extremity/Trunk Assessment   Upper Extremity Assessment Upper Extremity Assessment: Generalized weakness    Lower Extremity Assessment Lower Extremity Assessment: Generalized weakness    Cervical / Trunk  Assessment Cervical / Trunk Assessment: Normal  Communication   Communication: No difficulties  Cognition Arousal/Alertness: Awake/alert Behavior During Therapy: WFL for tasks assessed/performed Overall Cognitive Status: Within Functional Limits for tasks assessed                                        General Comments      Exercises     Assessment/Plan    PT Assessment Patient needs continued PT services  PT Problem List Decreased strength;Decreased activity tolerance;Decreased balance;Decreased mobility       PT Treatment Interventions Balance training;Functional mobility training;Therapeutic activities;Therapeutic exercise;Patient/family education;Wheelchair mobility training    PT Goals (Current goals can be found in the Care Plan section)  Acute Rehab PT Goals Patient Stated Goal: increase strength for bed mobility and sitting in chair PT Goal Formulation: With patient/family Time For Goal Achievement: 05/28/19 Potential to Achieve Goals: Good    Frequency Min 3X/week   Barriers to discharge        Co-evaluation               AM-PAC PT "6 Clicks" Mobility  Outcome Measure Help needed turning from your back to your side while in a flat bed without using bedrails?: A Lot Help needed moving from lying on your back to sitting on the side of a flat bed without using bedrails?: A Lot Help needed moving to and from a bed to a chair (including a wheelchair)?: Total Help needed standing up from a chair using your arms (e.g., wheelchair or bedside chair)?: Total Help needed to walk in hospital room?: Total Help needed climbing 3-5 steps with a railing? : Total 6 Click Score: 8    End of Session   Activity Tolerance: Patient tolerated treatment well;Patient limited by fatigue;Other (comment)(limited due to wound dressing to buttocks sticking to bed) Patient left: in bed;with call bell/phone within reach;with family/visitor present Nurse  Communication: Mobility status PT Visit Diagnosis: Muscle weakness (generalized) (M62.81);Unsteadiness on feet (R26.81);Difficulty in walking, not elsewhere classified (R26.2)    Time: 4580-9983 PT Time Calculation (min) (ACUTE ONLY): 20 min   Charges:   PT Evaluation $PT Eval Moderate Complexity: 1 Mod PT Treatments $Therapeutic Activity: 8-22 mins        12:06 PM, 05/14/19 Lonell Grandchild, MPT Physical Therapist with [facility] 336 [phone] office [phone] mobile phone

## 2019-05-14 NOTE — NC FL2 (Signed)
[place] LEVEL OF CARE SCREENING TOOL     IDENTIFICATION  Patient Name: Jonathan Wise Birthdate: 12-Feb-1946 Sex: male Admission Date (Current Location): 05/13/2019  [facility] and [place] Number:  [employer] and Address:  [place] [address] [place]      Provider Number: [id number]  Attending Physician Name and Address:  Murlean Iba, MD  Relative Name and Phone Number:       Current Level of Care: Hospital Recommended Level of Care: [place] Prior Approval Number: [id number]  Date Approved/Denied:   PASRR Number:    Discharge Plan: SNF    Current Diagnoses: Patient Active Problem List   Diagnosis Date Noted  . Chronic painful diabetic neuropathy ([place]) 05/14/2019  . Bedbound 05/14/2019  . Former heavy tobacco smoker 05/14/2019  . Atrial fibrillation with rapid ventricular response ([place]) 05/13/2019  . Chronic osteoarthritis 01/01/2017  . Lower extremity weakness 01/01/2017  . Anemia of chronic disease 12/25/2015  . Candidal intertrigo 12/22/2015  . 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 Bradycardia 03/15/2014  . Lactic acidosis 03/15/2014  . Atrial fibrillation ([place]) 06/23/2012  . PVC's (premature ventricular contractions) 06/23/2012  . Cellulitis of leg, left 06/22/2012  . Sepsis due to pneumonia ([place]) 06/22/2012  . Morbid obesity ([place]) 06/22/2012  . Osteoarthritis of right knee 06/22/2012  . Current use of long term anticoagulation 06/22/2012  . Pulmonary artery hypertension ([place]) 10/18/2011  . Bilateral lower extremity edema 10/14/2011  . Hypotension 10/14/2011  . Venous stasis ulcers ([place]) 10/14/2011  . Type 2 diabetes mellitus without complication ([place]) 78/24/2353    Orientation RESPIRATION BLADDER Height & Weight     Self, Time, Situation  Normal Continent Weight: (!) 136.2 kg Height:  6\' 1"  (185.4 cm)  BEHAVIORAL SYMPTOMS/MOOD NEUROLOGICAL BOWEL NUTRITION  STATUS      Incontinent (see DC summary)  AMBULATORY STATUS COMMUNICATION OF NEEDS Skin   Extensive Assist Verbally Other (Comment)(see Generated document for Wound care note detailing skin)                       Personal Care Assistance Level of Assistance  Bathing, Feeding, Dressing Bathing Assistance: Maximum assistance Feeding assistance: Limited assistance Dressing Assistance: Maximum assistance     Functional Limitations Info  Sight, Hearing, Speech Sight Info: Adequate Hearing Info: Adequate Speech Info: Adequate    SPECIAL CARE FACTORS FREQUENCY  PT (By licensed PT)     PT Frequency: 5x/week              Contractures Contractures Info: Not present    Additional Factors Info  Code Status, Allergies Code Status Info: FULL Allergies Info: Furosemide           Current Medications (05/14/2019):  This is the current hospital active medication list Current Facility-Administered Medications  Medication Dose Route Frequency Provider Last Rate Last Admin  . acetaminophen (TYLENOL) tablet 650 mg  650 mg Oral Q6H PRN Emokpae, Ejiroghene E, MD       Or  . acetaminophen (TYLENOL) suppository 650 mg  650 mg Rectal Q6H PRN Emokpae, Ejiroghene E, MD      . apixaban (ELIQUIS) tablet 5 mg  5 mg Oral BID Emokpae, Ejiroghene E, MD   5 mg at 05/14/19 0842  . cefTRIAXone (ROCEPHIN) 2 g in sodium chloride 0.9 % 100 mL IVPB  2 g Intravenous Q24H Emokpae, Ejiroghene E, MD 200 mL/hr at 05/13/19 1848 2 g at 05/13/19  1848  . Chlorhexidine Gluconate Cloth 2 % PADS 6 each  6 each Topical Daily Emokpae, Ejiroghene E, MD   6 each at 05/14/19 1000  . Chlorhexidine Gluconate Cloth 2 % PADS 6 each  6 each Topical Q0600 Emokpae, Ejiroghene E, MD   6 each at 05/14/19 0618  . collagenase (SANTYL) ointment   Topical Daily Johnson, Clanford L, MD      . doxycycline (VIBRAMYCIN) 100 mg in sodium chloride 0.9 % 250 mL IVPB  100 mg Intravenous Q12H Emokpae, Ejiroghene E, MD 125 mL/hr at 05/14/19  1112 100 mg at 05/14/19 1112  . Gerhardt's butt cream   Topical BID Johnson, Clanford L, MD      . hydrocerin (EUCERIN) cream   Topical BID Johnson, Clanford L, MD      . insulin aspart (novoLOG) injection 0-15 Units  0-15 Units Subcutaneous TID WC Emokpae, Ejiroghene E, MD   2 Units at 05/14/19 1155  . insulin aspart (novoLOG) injection 0-5 Units  0-5 Units Subcutaneous QHS Emokpae, Ejiroghene E, MD      . insulin glargine (LANTUS) injection 23 Units  23 Units Subcutaneous QPM Emokpae, Ejiroghene E, MD   23 Units at 05/13/19 2138  . metoprolol tartrate (LOPRESSOR) tablet 25 mg  25 mg Oral BID Emokpae, Ejiroghene E, MD   25 mg at 05/14/19 0843  . mupirocin ointment (BACTROBAN) 2 % 1 application  1 application Nasal BID Emokpae, Ejiroghene E, MD   1 application at 05/14/19 0846  . ondansetron (ZOFRAN) tablet 4 mg  4 mg Oral Q6H PRN Emokpae, Ejiroghene E, MD       Or  . ondansetron (ZOFRAN) injection 4 mg  4 mg Intravenous Q6H PRN Emokpae, Ejiroghene E, MD      . verapamil (CALAN-SR) CR tablet 120 mg  120 mg Oral Daily Emokpae, Ejiroghene E, MD   120 mg at 05/14/19 0931     Discharge Medications: Please see discharge summary for a list of discharge medications.  Relevant Imaging Results:  Relevant Lab Results:   Additional Information SSN [id number]  Hye Trawick, Chrystine Oiler, RN

## 2019-05-14 NOTE — TOC Initial Note (Addendum)
Transition of Care [facility]) - Initial/Assessment Note    Patient Details  Name: Jonathan Wise MRN: [id number] Date of Birth: 07-23-1945  Transition of Care [facility]) CM/SW Contact:    Kamaury Cutbirth, Chauncey Reading, RN Phone Number: 05/14/2019, 12:40 PM  Clinical Narrative:  74 year old gentleman presented with chronic diarrhea, chronic leg ulcers, sacral ulcer and noted to be in A. fib with RVR. From home with wife. Recommended for SNF, patient agreeable to referrals at [facility], [facility], and [facility].                  Expected Discharge Plan: Skilled Nursing Facility Barriers to Discharge: Continued Medical Work up, SNF Pending bed offer   Patient Goals and CMS Choice Patient states their goals for this hospitalization and ongoing recovery are:: patient would like SNF and then talk with his lawyer/finances about long term CMS Medicare.gov Compare Post Acute Care list provided to:: Patient Choice offered to / list presented to : Patient  Expected Discharge Plan and Services Expected Discharge Plan: [place]   Discharge Planning Services: CM Consult Post Acute Care Choice: Nursing Home Living arrangements for the past 2 months: Single Family Home                                      Prior Living Arrangements/Services Living arrangements for the past 2 months: Single Family Home Lives with:: Spouse          Need for Family Participation in Patient Care: Yes (Comment) Care giver support system in place?: Yes (comment) Current home services: DME    Activities of Daily Living Home Assistive Devices/Equipment: Cane (specify quad or straight), Walker (specify type), [employer] fast streamer ADL Screening (condition at time of admission) Patient's cognitive ability adequate to safely complete daily activities?: Yes Is the patient deaf or have difficulty hearing?: No Does the patient have difficulty seeing, even when wearing glasses/contacts?: No Does the patient have  difficulty concentrating, remembering, or making decisions?: No Patient able to express need for assistance with ADLs?: Yes Does the patient have difficulty dressing or bathing?: Yes Independently performs ADLs?: No Communication: Independent Dressing (OT): Dependent Is this a change from baseline?: Pre-admission baseline Grooming: Dependent Is this a change from baseline?: Pre-admission baseline Feeding: Dependent Is this a change from baseline?: Pre-admission baseline Bathing: Dependent Is this a change from baseline?: Pre-admission baseline Toileting: Dependent Is this a change from baseline?: Pre-admission baseline In/Out Bed: Dependent Is this a change from baseline?: Pre-admission baseline Walks in Home: Dependent Is this a change from baseline?: Pre-admission baseline Does the patient have difficulty walking or climbing stairs?: Yes Weakness of Legs: Both Weakness of Arms/Hands: None  Permission Sought/Granted                  Emotional Assessment   Attitude/Demeanor/Rapport: Engaged Affect (typically observed): Accepting, Appropriate Orientation: : Oriented to Self, Oriented to Place, Oriented to  Time, Oriented to Situation Alcohol / Substance Use: Not Applicable Psych Involvement: No (comment)  Admission diagnosis:  Atrial fibrillation with rapid ventricular response (HCC) [I48.91] Atrial fibrillation with RVR ([place]) [I48.91] Patient Active Problem List   Diagnosis Date Noted  . Chronic painful diabetic neuropathy ([place]) 05/14/2019  . Bedbound 05/14/2019  . Former heavy tobacco smoker 05/14/2019  . Atrial fibrillation with rapid ventricular response ([place]) 05/13/2019  . Chronic osteoarthritis 01/01/2017  . Lower extremity weakness 01/01/2017  . Anemia of chronic disease  12/25/2015  . Candidal intertrigo 12/22/2015  . 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 Bradycardia 03/15/2014  . Lactic acidosis 03/15/2014  . Atrial fibrillation (HCC) 06/23/2012   . PVC's (premature ventricular contractions) 06/23/2012  . Cellulitis of leg, left 06/22/2012  . Sepsis due to pneumonia (HCC) 06/22/2012  . Morbid obesity (HCC) 06/22/2012  . Osteoarthritis of right knee 06/22/2012  . Current use of long term anticoagulation 06/22/2012  . Pulmonary artery hypertension (HCC) 10/18/2011  . Bilateral lower extremity edema 10/14/2011  . Hypotension 10/14/2011  . Venous stasis ulcers (HCC) 10/14/2011  . Type 2 diabetes mellitus without complication (HCC) 10/14/2011   PCP:  [name], MD Pharmacy:   [phone] [facility] [place], [place] - [address] [place] [address] Phone: [phone] Fax: [phone]     Social Determinants of Health (SDOH) Interventions    Readmission Risk Interventions No flowsheet data found.

## 2019-05-14 NOTE — Progress Notes (Signed)
Pt does not want to wear our CPAP here. He states he does not wear one at home and chooses not to wear one while he is here.

## 2019-05-14 NOTE — Consult Note (Signed)
WOC Nurse wound follow up Please see AM assessment note Wound type: venous stasis ulcers to bilateral lower legs and device related injury to mid calf circumferentially.  Patient and wife wrap legs weekly at home.  Measurement:2 cm x 4.5 cm scab to left anterior lower leg with creasing from Unna wrap noted from previous applications Right leg:  2 cm x 1.5 cm scabbed lesion with creasing from previous injury. LEgs are stained with zinc, chronic skin changes including lichenification to feet and legs. Please use Unna boot with calamine.  Extra supplies left at  Bedside for next application  Wound TLX:BWIOMBT- applied enzymatic debrider to soften devitalized tissue Drainage (amount, consistency, odor) minimal bleeding and serosanguinous effluent Periwound: Edema, chronic skin changes and erythema Dressing procedure/placement/frequency:Cleansed bilateral lower legs and feet.  Santyl to scabbed wounds.  TOpped wit NS moist gauze.  Wrapped with calamine zinc layer from below toes to below knees.  Secured with kerlix and coban.  Change again 05/19/19 if in house or discharged.  Patient understands.  Will not follow at this time.  Please re-consult if needed.  Maple Hudson MSN, RN, FNP-BC CWON Wound, Ostomy, Continence Nurse Pager [phone]

## 2019-05-14 NOTE — Progress Notes (Signed)
PROGRESS NOTE [address] CAMPUS   STEWARD SAMES  SLH:[id number]  DOB: 10-07-1945  DOA: 05/13/2019 PCP: [name], MD   Brief Admission Hx: 74 year old gentleman presented with chronic diarrhea, chronic leg ulcers, sacral ulcer and noted to be in A. fib with RVR.  He was initially admitted to the stepdown ICU for IV Cardizem treatment.  MDM/Assessment & Plan:   1. A. fib RVR-his heart rate is much better controlled now and he has been weaned off of the IV diltiazem infusion.  He has been restarted on his home verapamil and metoprolol.  His heart rate is better controlled.  He is fully anticoagulated with apixaban. 2. Hypokalemia-oral replacement ordered.  Recheck in a.m.  Check magnesium. 3. Chronic leg ulcers and sacral ulcers.  These are chronic.  He had been seen and followed at the wound care center in [place] near [facility] however he reported to me that he stopped going because he felt like he could manage the wounds independently at home with his wife.  Unfortunately he says that he has not been caring for them recently and they have exacerbated.  Wound care consult requested. 4. 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  He has been restarted on his home insulin regimen and we will follow and adjust as needed.  Monitor CBG closely. 5. Cellulitis of the left lower extremity-ceftriaxone and doxycycline IV ordered.  Follow clinically for improvement. 6. Chronic debility-patient reports he has not gotten out of bed in several years.  PT evaluation requested.  Patient is interested in long-term care. 7. OSA-patient says he is has not been using CPAP at home for quite a long time.  He says he had a sleep study years ago.  Ordered for auto titratable CPAP while in hospital however explained to patient that he likely will  need another outpatient sleep study done to arrange for home CPAP.  DVT prophylaxis: Apixaban Code Status: Full Family Communication: Patient updated at bedside, verbalizes understanding Disposition Plan: From home, continue IV antibiotics for now, PT evaluation, complex wound care management   Consultants:  Wound care  Procedures:    Antimicrobials:  Ceftriaxone  Doxycycline  Subjective: Patient reports that he is less weak than yesterday.  He denies fever and chills.  He denies shortness of breath chest pain.  He denies palpitations and chest pain.  Objective: Vitals:   05/14/19 0800 05/14/19 0900 05/14/19 0930 05/14/19 1030  BP: 124/71 (!) 149/72 (!) 116/59 (!) 116/55  Pulse: (!) 106 (!) 104 (!) 102 (!) 103  Resp: 19 (!) 38 (!) 31 (!) 22  Temp:      TempSrc:      SpO2: 99% 99% 100% 94%  Weight:      Height:        Intake/Output Summary (Last 24 hours) at 05/14/2019 1049 Last data filed at 05/14/2019 0800 Gross per 24 hour  Intake 431.44 ml  Output --  Net 431.44 ml   Filed Weights   05/13/19 1155 05/14/19 0500  Weight: (!) 136.7 kg (!) 136.2 kg     REVIEW OF SYSTEMS  As per history otherwise all reviewed and reported negative  Exam:  General exam: Morbidly obese male lying in the bed he is chronically ill-appearing. Respiratory system: Clear. No increased work of breathing. Cardiovascular system: Irregularly irregular, S1 & S2 heard.  Gastrointestinal system:  Abdomen is nondistended, soft and nontender. Normal bowel sounds heard. Central nervous system: Alert and oriented. No focal neurological deficits. 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     Data Reviewed: Basic Metabolic Panel: Recent Labs  Lab  05/13/19 1359 05/13/19 1746 05/14/19 0610  NA 140  --  141  K 3.9  --  3.3*  CL 101  --  102  CO2 29  --  29  GLUCOSE 121*  --  133*  BUN 13  --  12  CREATININE 0.65  --  0.58*  CALCIUM 8.9  --  8.4*  MG  --  2.1  --    Liver Function Tests: Recent Labs  Lab 05/13/19 1359  AST 20  ALT 14  ALKPHOS 63  BILITOT 1.3*  PROT 6.8  ALBUMIN 2.7*   No results for input(s): LIPASE, AMYLASE in the last 168 hours. No results for input(s): AMMONIA in the last 168 hours. CBC: Recent Labs  Lab 05/13/19 1359 05/14/19 0610  WBC 16.3* 11.1*  NEUTROABS 13.9*  --   HGB 12.8* 11.5*  HCT 44.5 40.3  MCV 89.0 88.8  PLT 439* 410*   Cardiac Enzymes: No results for input(s): CKTOTAL, CKMB, CKMBINDEX, TROPONINI in the last 168 hours. CBG (last 3)  Recent Labs    05/13/19 2117 05/14/19 0742  GLUCAP 169* 112*   Recent Results (from the past 240 hour(s))  Respiratory Panel by RT PCR (Flu A&B, Covid) - Nasopharyngeal Swab     Status: None   Collection Time: 05/13/19  2:03 PM   Specimen: Nasopharyngeal Swab  Result Value Ref Range Status   SARS Coronavirus 2 by RT PCR NEGATIVE NEGATIVE Final    Comment: (NOTE) SARS-CoV-2 target nucleic acids are NOT DETECTED. The SARS-CoV-2 RNA is generally detectable in upper respiratoy specimens during the acute phase of infection. The lowest concentration of SARS-CoV-2 viral copies this assay can detect is 131 copies/mL. A negative result does not preclude SARS-Cov-2 infection and should not be used as the sole basis for treatment or other patient management decisions. A negative result may occur with  improper specimen collection/handling, submission of specimen other than nasopharyngeal swab, presence of viral mutation(s) within the areas targeted by this assay, and inadequate number of viral copies (<131 copies/mL). A negative result must be combined with clinical observations, patient history, and epidemiological information. The expected  result is Negative. Fact Sheet for Patients:  https://www.moore.com/ Fact Sheet for Healthcare Providers:  https://www.young.biz/ This test is not yet ap proved or cleared by the [place] FDA and  has been authorized for detection and/or diagnosis of SARS-CoV-2 by FDA under an Emergency Use Authorization (EUA). This EUA will remain  in effect (meaning this test can be used) for the duration of the COVID-19 declaration under Section 564(b)(1) of the Act, 21 U.S.C. section 360bbb-3(b)(1), unless the authorization is terminated or revoked sooner.    Influenza A by PCR NEGATIVE NEGATIVE Final   Influenza B by PCR NEGATIVE NEGATIVE Final    Comment: (NOTE) The Xpert Xpress SARS-CoV-2/FLU/RSV assay is intended as an aid in  the diagnosis of influenza from Nasopharyngeal swab specimens and  should not be used as a sole basis for treatment. Nasal washings and  aspirates are unacceptable for Xpert Xpress SARS-CoV-2/FLU/RSV  testing. Fact Sheet for Patients: PinkCheek.be Fact Sheet for Healthcare Providers: GravelBags.it This test is not yet approved or cleared by the [place] FDA and  has been authorized for detection and/or diagnosis of SARS-CoV-2 by  FDA under an Emergency Use Authorization (EUA). This EUA will remain  in effect (meaning this test can be used) for the duration of the  Covid-19 declaration under Section 564(b)(1) of the Act, 21  U.S.C. section 360bbb-3(b)(1), unless the authorization is  terminated or revoked. Performed at [facility], [address], [place], [place] [address]   MRSA PCR Screening     Status: Abnormal   Collection Time: 05/13/19  5:17 PM   Specimen: Nasal Mucosa; Nasopharyngeal  Result Value Ref Range Status   MRSA by PCR POSITIVE (A) NEGATIVE Final    Comment:        The GeneXpert MRSA Assay (FDA approved for NASAL specimens only), is one  component of a comprehensive MRSA colonization surveillance program. It is not intended to diagnose MRSA infection nor to guide or monitor treatment for MRSA infections. RESULT CALLED TO, READ BACK BY AND VERIFIED WITH: Olena Leatherwood @2129  05/13/19 [facility] Performed at [facility], [address], [place], [place] [address]   Culture, blood (routine x 2)     Status: None (Preliminary result)   Collection Time: 05/13/19  6:34 PM   Specimen: BLOOD LEFT HAND  Result Value Ref Range Status   Specimen Description BLOOD LEFT HAND  Final   Special Requests   Final    BOTTLES DRAWN AEROBIC AND ANAEROBIC Blood Culture adequate volume   Culture   Final    NO GROWTH < 12 HOURS Performed at [facility], [address], [place], [place] [address]    Report Status PENDING  Incomplete  Culture, blood (routine x 2)     Status: None (Preliminary result)   Collection Time: 05/13/19  6:43 PM   Specimen: BLOOD LEFT ARM  Result Value Ref Range Status   Specimen Description BLOOD LEFT ARM  Final   Special Requests   Final    BOTTLES DRAWN AEROBIC AND ANAEROBIC Blood Culture adequate volume   Culture   Final    NO GROWTH < 12 HOURS Performed at [facility], [address] [place],  [address]    Report Status PENDING  Incomplete     Studies: DG Chest Portable 1 View  Result Date: 05/13/2019 CLINICAL DATA:  Weakness, bilateral lower extremity edema EXAM: PORTABLE CHEST 1 VIEW COMPARISON:  07/20/2017 FINDINGS: Single frontal view of the chest demonstrates stable hiatal hernia. Cardiac silhouette is unremarkable. No airspace disease, effusion, or pneumothorax. IMPRESSION: 1. Stable exam, no acute process. Electronically Signed   By: Randa Ngo M.D.   On: 05/13/2019 13:11     Scheduled Meds: . apixaban  5 mg Oral BID  . Chlorhexidine Gluconate Cloth  6 each Topical Daily  . Chlorhexidine Gluconate Cloth  6 each Topical Q0600  . collagenase   Topical Daily  . Gerhardt's butt cream   Topical  BID  . insulin aspart  0-15 Units Subcutaneous TID WC  . insulin aspart  0-5 Units Subcutaneous QHS  . insulin glargine  23 Units Subcutaneous QPM  . metoprolol tartrate  25 mg Oral BID  . mupirocin ointment  1 application Nasal BID  . verapamil  120 mg Oral Daily   Continuous Infusions: . cefTRIAXone (ROCEPHIN)  IV 2 g (05/13/19 1848)  . doxycycline (VIBRAMYCIN) IV 100 mg (05/13/19 2141)  Active Problems:   Atrial fibrillation with rapid ventricular response [facility])  Critical Care Procedure Note Authorized and Performed by: Maryln Manuel MD  Total Critical Care time:  35 minutes  Due to a high probability of clinically significant, life threatening deterioration, the patient required my highest level of preparedness to intervene emergently and I personally spent this critical care time directly and personally managing the patient.  This critical care time included obtaining a history; examining the patient, pulse oximetry; ordering and review of studies; arranging urgent treatment with development of a management plan; evaluation of patient's response of treatment; frequent reassessment; and discussions with other providers.  This critical care time was performed to assess and manage the high probability of imminent and life threatening deterioration that could result in multi-organ failure.  It was exclusive of separately billable procedures and treating other patients and teaching time.     Standley Dakins, MD [facility] 05/14/2019, 10:49 AM    LOS: 1 day  How to contact the [facility] Attending or Consulting provider 7A - 7P or covering provider during after hours 7P -7A, for this patient?  1. Check the care team in [facility] and look for a) attending/consulting TRH provider listed and b) the [facility] team listed 2. Log into [email or web address] and use [facility]'s universal password to access. If you do not have the password, please contact the hospital operator. 3. Locate the [facility] provider you are  looking for under [facility] and page to a number that you can be directly reached. 4. If you still have difficulty reaching the provider, please page the [facility] (Director on Call) for the Hospitalists listed on amion for assistance.

## 2019-05-14 NOTE — Consult Note (Signed)
WOC Nurse wound consult note Consultation was completed by review of records, images and assistance from the bedside nurse/clinical staff as well as the use of Elink technology.   Reason for Consult: bilateral LEs and buttock Wound type: Right ischium; unstageable pressure injury Left ischium; unstageable pressure injury Buttock/scrotum; MASD (moisture associated skin damage); patient is incontinent of stool and urine. External male urine collector in place.  Intertriginous moisture associated skin damage under patient pannus Bilateral LEs; scattered partial thickness scabbed areas with venous dermatitis over the RLE.  Full thickness ulceration on the left LE pretibial region.  Area on the left leg after closer inspection appears to be from inappropriate application of his Unna's boots.  The therapeutic application would be from the first metatarsal head to the patellar notch, explained rationale for this application Discussed long term use of compression stockings, he does not seem to have been compliant with this in the past and does not currently have them After review of the chart this patient was last seen by my partners as far back as 2013; LE wounds are a chronic issue for this patient.  Pressure Injury POA: Yes Measurement: Left ischium:per nursing flow sheet and after review of images; 6cm x 2cm x 0.1cm ; 100% necrotic/black  Right ischium: requested bedside nurse to measure with application of ordered topical therapy; 100% necrotic/black  Left LE pretibial; will ask bedside nurse to measure with application of topical therapy; dry, ruddy wound bed; 25% yellow fibrinous material   Wound bed:see above Drainage (amount, consistency, odor) unable to assess, no dressings in place   Periwound:  Intact  Dressing procedure/placement/frequency: 1. Add enzymatic debridement for the bilateral ischial wounds and LLE wound 2. Will need low air loss mattress if transfers from the ICU, orders  added 3. Gerhart's butt cream for the areas affected with MASD; apply at least q shift and after each episode of incontinence 4. Add nutritional consultation for supplementation for wound healing.  5. Eucerin for venous stasis dermatitis 6. Unna's boots bilaterally, WOC to apply either today or tomorrow am. No trained staff on site.  7. TOC to evaluate for SNF; will need air mattress in facility for pressure redistribution and moisture management 8. Antimicrobial wicking fabric under pannus for MASD      Discussed POC with patient and bedside nurse.  Re consult if needed, will not follow at this time. Thanks  Willys Salvino [employer], RN,CWOCN, CNS, CWON-AP [phone])

## 2019-05-14 NOTE — Progress Notes (Signed)
Patient refusing CPAP. No unit in room at this time. 

## 2019-05-15 ENCOUNTER — Encounter (HOSPITAL_COMMUNITY): Payer: Self-pay | Admitting: Internal Medicine

## 2019-05-15 LAB — GLUCOSE, CAPILLARY
Glucose-Capillary: 107 mg/dL — ABNORMAL HIGH (ref 70–99)
Glucose-Capillary: 130 mg/dL — ABNORMAL HIGH (ref 70–99)
Glucose-Capillary: 144 mg/dL — ABNORMAL HIGH (ref 70–99)
Glucose-Capillary: 144 mg/dL — ABNORMAL HIGH (ref 70–99)

## 2019-05-15 LAB — CBC WITH DIFFERENTIAL/PLATELET
Abs Immature Granulocytes: 0.06 10*3/uL (ref 0.00–0.07)
Basophils Absolute: 0.1 10*3/uL (ref 0.0–0.1)
Basophils Relative: 1 %
Eosinophils Absolute: 0.8 10*3/uL — ABNORMAL HIGH (ref 0.0–0.5)
Eosinophils Relative: 9 %
HCT: 36.6 % — ABNORMAL LOW (ref 39.0–52.0)
Hemoglobin: 10.3 g/dL — ABNORMAL LOW (ref 13.0–17.0)
Immature Granulocytes: 1 %
Lymphocytes Relative: 11 %
Lymphs Abs: 1 10*3/uL (ref 0.7–4.0)
MCH: 25.5 pg — ABNORMAL LOW (ref 26.0–34.0)
MCHC: 28.1 g/dL — ABNORMAL LOW (ref 30.0–36.0)
MCV: 90.6 fL (ref 80.0–100.0)
Monocytes Absolute: 1 10*3/uL (ref 0.1–1.0)
Monocytes Relative: 11 %
Neutro Abs: 6.4 10*3/uL (ref 1.7–7.7)
Neutrophils Relative %: 67 %
Platelets: 412 10*3/uL — ABNORMAL HIGH (ref 150–400)
RBC: 4.04 MIL/uL — ABNORMAL LOW (ref 4.22–5.81)
RDW: 16.2 % — ABNORMAL HIGH (ref 11.5–15.5)
WBC: 9.3 10*3/uL (ref 4.0–10.5)
nRBC: 0 % (ref 0.0–0.2)

## 2019-05-15 LAB — COMPREHENSIVE METABOLIC PANEL
ALT: 12 U/L (ref 0–44)
AST: 18 U/L (ref 15–41)
Albumin: 2.2 g/dL — ABNORMAL LOW (ref 3.5–5.0)
Alkaline Phosphatase: 50 U/L (ref 38–126)
Anion gap: 10 (ref 5–15)
BUN: 11 mg/dL (ref 8–23)
CO2: 30 mmol/L (ref 22–32)
Calcium: 8.3 mg/dL — ABNORMAL LOW (ref 8.9–10.3)
Chloride: 102 mmol/L (ref 98–111)
Creatinine, Ser: 0.52 mg/dL — ABNORMAL LOW (ref 0.61–1.24)
GFR calc Af Amer: >60 mL/min (ref >60)
GFR calc non Af Amer: >60 mL/min (ref >60)
Glucose, Bld: 151 mg/dL — ABNORMAL HIGH (ref 70–99)
Potassium: 3.4 mmol/L — ABNORMAL LOW (ref 3.5–5.1)
Sodium: 142 mmol/L (ref 135–145)
Total Bilirubin: 0.4 mg/dL (ref 0.3–1.2)
Total Protein: 5.5 g/dL — ABNORMAL LOW (ref 6.5–8.1)

## 2019-05-15 LAB — MAGNESIUM: Magnesium: 1.9 mg/dL (ref 1.7–2.4)

## 2019-05-15 MED ORDER — POTASSIUM CHLORIDE 20 MEQ PO PACK
20.0000 meq | PACK | Freq: Every day | ORAL | Status: DC
  Administered 2019-05-15 – 2019-05-19 (×5): 20 meq via ORAL
  Filled 2019-05-15 (×7): qty 1

## 2019-05-15 MED ORDER — ENSURE MAX PROTEIN PO LIQD
11.0000 [oz_av] | Freq: Every day | ORAL | Status: DC
  Administered 2019-05-15 – 2019-05-18 (×3): 11 [oz_av] via ORAL

## 2019-05-15 MED ORDER — ADULT MULTIVITAMIN W/MINERALS CH
1.0000 | ORAL_TABLET | Freq: Every day | ORAL | Status: DC
  Administered 2019-05-15 – 2019-05-19 (×5): 1 via ORAL
  Filled 2019-05-15 (×6): qty 1

## 2019-05-15 MED ORDER — JUVEN PO PACK
1.0000 | PACK | Freq: Two times a day (BID) | ORAL | Status: DC
  Administered 2019-05-15 – 2019-05-19 (×7): 1 via ORAL
  Filled 2019-05-15 (×9): qty 1

## 2019-05-15 MED ORDER — ENSURE ENLIVE PO LIQD
237.0000 mL | ORAL | Status: DC
  Administered 2019-05-16 – 2019-05-18 (×3): 237 mL via ORAL

## 2019-05-15 MED ORDER — PRO-STAT SUGAR FREE PO LIQD
30.0000 mL | Freq: Three times a day (TID) | ORAL | Status: DC
  Administered 2019-05-15 – 2019-05-19 (×12): 30 mL via ORAL
  Filled 2019-05-15 (×12): qty 30

## 2019-05-15 NOTE — Progress Notes (Signed)
Initial Nutrition Assessment  RD working remotely.   DOCUMENTATION CODES:   Obesity unspecified  INTERVENTION:  - will order Ensure Enlive once/day, each supplement provides 350 kcal and 20 grams of protein. - will order Ensure Max once/day, each supplement provides 150 kcal and 30 grams of protein.  - will order 30 mL Prostat BID, each supplement provides 100 kcal and 15 grams of protein. - will order daily multivitamin with minerals.  - will order Juven BID, each packet provides 95 calories, 2.5 grams of protein (collagen), and 9.8 grams of carbohydrate (3 grams sugar); also contains 7 grams of L-arginine and L-glutamine, 300 mg vitamin C, 15 mg vitamin E, 1.2 mcg vitamin B-12, 9.5 mg zinc, 200 mg calcium, and 1.5 g  Calcium Beta-hydroxy-Beta-methylbutyrate to support wound healing.    NUTRITION DIAGNOSIS:   Increased nutrient needs related to wound healing as evidenced by estimated needs.  GOAL:   Patient will meet greater than or equal to 90% of their needs  MONITOR:   PO intake, Supplement acceptance, Labs, Weight trends  REASON FOR ASSESSMENT:   Consult Wound healing  ASSESSMENT:   74 year old male with medical history of afib, BLE edema, hyperlipidemia, HTN, kidney stones, pulmonary HTN, type 2 DM, urinary retention, chronic diarrhea, chronic sacral ulcer, and chronic BLE venous stasis ulcers. He was admitted with afib with RVR. He was started on Cardizem drip which has been transitioned to oral Cardizem.  No RD working on site today and unable to reach patient by phone. Per flow sheet documentation, he consumed 100% of breakfast yesterday; no other intakes documented since admission.   Per chart review, weight on 2/2 was 301 lb and has now trended slightly down to 300 lb. PTA, the most recently documented weight was on 07/20/17 when he weighed 285 lb. Will monitor trends.   Per notes: - afib with RVR--rate controlled - mild hypokalemia - chronic ulcers--WOC  following - hx of type 2 DM with severe neuropathy - BLE cellulitis - chronic debility - medically stable for d/c with plan to go to SNF when a bed is available.    Labs reviewed; CBGs: 107 and 130 mg/dl, K: 3.4 mmol/l, creatinine: 0.52 mg/dl, Ca: 8.3 mg/dl. Medications reviewed; sliding scale novolog, 23 units lantus/day, 20 mEq Klor-Con/day.    NUTRITION - FOCUSED PHYSICAL EXAM:  unable to complete at this time.   Diet Order:   Diet Order            Diet heart healthy/carb modified Room service appropriate? Yes; Fluid consistency: Thin  Diet effective now              EDUCATION NEEDS:   Not appropriate for education at this time  Skin:  Skin Assessment: Skin Integrity Issues: Skin Integrity Issues:: Unstageable, Other (Comment) Unstageable: full thickness to L buttock and R IT Other: venous stasis ulcers to pre-tibial area  Last BM:  2/2  Height:   Ht Readings from Last 1 Encounters:  05/13/19 6\' 1"  (1.854 m)    Weight:   Wt Readings from Last 1 Encounters:  05/15/19 (!) 136.2 kg    Ideal Body Weight:  83.6 kg  BMI:  Body mass index is 39.62 kg/m.  Estimated Nutritional Needs:   Kcal:  2315-2590 kcal  Protein:  130-145 grams  Fluid:  >/= 2.3 L/day     08-20-1992, MS, RD, LDN, CNSC Inpatient Clinical Dietitian RD pager # available in AMION  After hours/weekend pager # available in [facility]

## 2019-05-15 NOTE — Progress Notes (Signed)
Physical Therapy Treatment Patient Details Name: Jonathan Wise MRN: [id number] DOB: 09-Jul-1945 Today's Date: 05/15/2019    History of Present Illness Jonathan Wise is a 74 y.o. male with medical history significant for atrial fibrillation on chronic anticoagulation, diabetes mellitus, pulmonary hypertension, bedridden.  Patient presented to the ED complaining of chronic diarrhea-intermittent over the past year.  Reports episodes almost every 2 weeks.  Reports recent episode started about 4 days ago.  He reports at least 2 loose stools daily, for which she wears adult diapers, he is bedridden and so his wife has to clean him up twice a day.  Between these he still has bowel movements. He reports loud "grumbling" bowel sounds.He tells me has had chronic lower extremity swelling and dealing with chronic ulcers since 2012.  Over the past 4 days he reports worsening of the ulcers on his bilateral lower extremity L > R.,  with pain.  He also reports a wound on his buttock.No fever no chills.  He specifically denies any difficulty breathing or chest pain.Reports compliance prior to today with his Eliquis, diltiazem and other medications.  Unable to tell me if he has been taking metoprolol.    PT Comments    Patient tolerated sitting up at bedside for approximately 25 minutes demonstrating fair/good static sitting balance, fair/poor when completing BLE ROM/strengthening with frequent leaning/falling backwards once fatigued, required occasional active assistance to lift legs against gravity, limited due to increasing HR up to 140's BPM, c/o fatigue and generalized weakness.  Patient unable to attempt sit to stands due to weakness and demonstrates fair/good return for using RUE and BLE to help scoot self up in bed with bed in head down position.  Patient will benefit from continued physical therapy in hospital and recommended venue below to increase strength, balance, endurance for safe ADLs and gait.    Follow Up Recommendations  SNF     Equipment Recommendations  None recommended by PT    Recommendations for Other Services       Precautions / Restrictions Precautions Precautions: Fall Restrictions Weight Bearing Restrictions: No    Mobility  Bed Mobility Overal bed mobility: Needs Assistance Bed Mobility: Supine to Sit;Sit to Supine     Supine to sit: Mod assist;HOB elevated Sit to supine: Max assist   General bed mobility comments: required head of bed raised and use of bed rails for supine to sitting, patient unable to lift legs onto bed during sit to supine requiring Max assist  Transfers                    Ambulation/Gait                 Stairs             Wheelchair Mobility    Modified Rankin (Stroke Patients Only)       Balance Overall balance assessment: Needs assistance Sitting-balance support: Feet supported;No upper extremity supported Sitting balance-Leahy Scale: Fair Sitting balance - Comments: fair static, fair/poor dynamic while seated at bedside, once fatigued patient falls backwards Postural control: Posterior lean                                  Cognition Arousal/Alertness: Awake/alert Behavior During Therapy: WFL for tasks assessed/performed Overall Cognitive Status: Within Functional Limits for tasks assessed  Exercises General Exercises - Lower Extremity Long Arc Quad: Seated;AROM;Strengthening;Both;10 reps;AAROM Hip Flexion/Marching: Seated;AROM;Strengthening;Both;10 reps;AAROM Toe Raises: Seated;AROM;Strengthening;Both;15 reps Heel Raises: Seated;AROM;Strengthening;Both;15 reps    General Comments        Pertinent Vitals/Pain Pain Assessment: Faces Faces Pain Scale: Hurts little more Pain Location: right knee Pain Descriptors / Indicators: Sore;Grimacing;Guarding Pain Intervention(s): Limited activity within patient's  tolerance;Monitored during session;Repositioned    Home Living                      Prior Function            PT Goals (current goals can now be found in the care plan section) Acute Rehab PT Goals Patient Stated Goal: increase strength for bed mobility and sitting in chair PT Goal Formulation: With patient/family Time For Goal Achievement: 05/28/19 Potential to Achieve Goals: Good Progress towards PT goals: Progressing toward goals    Frequency    Min 3X/week      PT Plan Current plan remains appropriate    Co-evaluation              AM-PAC PT "6 Clicks" Mobility   Outcome Measure  Help needed turning from your back to your side while in a flat bed without using bedrails?: A Lot Help needed moving from lying on your back to sitting on the side of a flat bed without using bedrails?: A Lot Help needed moving to and from a bed to a chair (including a wheelchair)?: Total Help needed standing up from a chair using your arms (e.g., wheelchair or bedside chair)?: Total Help needed to walk in hospital room?: Total Help needed climbing 3-5 steps with a railing? : Total 6 Click Score: 8    End of Session   Activity Tolerance: Patient tolerated treatment well;Patient limited by fatigue Patient left: in bed;with call bell/phone within reach Nurse Communication: Mobility status PT Visit Diagnosis: Muscle weakness (generalized) (M62.81);Unsteadiness on feet (R26.81);Difficulty in walking, not elsewhere classified (R26.2)     Time: 8341-9622 PT Time Calculation (min) (ACUTE ONLY): 34 min  Charges:  $Therapeutic Exercise: 8-22 mins $Therapeutic Activity: 8-22 mins                     10:44 AM, 05/15/19 Ocie Bob, MPT Physical Therapist with [facility] [phone] office [phone] mobile phone

## 2019-05-15 NOTE — Progress Notes (Signed)
PROGRESS NOTE    Jonathan Wise  HDQ:[id number] DOB: 12/22/1945 DOA: 05/13/2019 PCP: Christain Sacramento, MD    Brief Narrative:  74 year old man was admitted with atrial fibrillation with rapid ventricular response.  He was started on Cardizem drip which is now been discontinued and he is on oral Cardizem.  He has chronic diarrhea, chronic leg ulcers and sacral ulcer.   Assessment & Plan:   Principal Problem:   Atrial fibrillation with rapid ventricular response (HCC) Active Problems:   Bilateral lower extremity edema   Venous stasis ulcers (HCC)   Type 2 diabetes mellitus without complication (HCC)   Pulmonary artery hypertension (HCC)   Cellulitis of leg, left   Morbid obesity (HCC)   Current use of long term anticoagulation   Chronic venous stasis dermatitis   Chronic osteoarthritis   Lower extremity weakness   Chronic painful diabetic neuropathy ([place])   Bedbound   Former heavy tobacco smoker   1.  Atrial fibrillation with rapid ventricular response.  His ventricular rate is controlled at rest in the region of 90-100.  Continue with oral Cardizem.  He is fully anticoagulated with apixaban. 2.  Hypokalemia.  Potassium still low at 3.4 today.  Start daily potassium 20 mEq. 3.  Chronic leg ulcers and sacral ulcers.  These are chronic.  Wound care consult appreciated. 4.  Type 2 diabetes, insulin-dependent with severe neuropathy.  Continue with sliding scale and home insulin. 5.  Cellulitis of lower legs.  I think this is really chronic and intravenous antibiotics are continued.  White blood cell count is improved. 6.  Chronic debility.  Physical therapy is recommended skilled nursing facility placement which the patient agrees with. 7.  Obstructive sleep apnea.  He does not wish to wear a CPAP machine in the hospital.   DVT prophylaxis: Apixaban. Code Status: Full code. Family Communication: Patient updated at the bedside and verbalizes understanding. Disposition Plan: The  patient was admitted from home.  Physical therapy is recommended discharge to skilled nursing facility.  He is medically stable for discharge now and can go to the skilled nursing facility when bed is available.   Consultants:   Wound care.  Procedures:     Antimicrobials:   Rocephin.  Doxycycline.   Subjective: He has no specific complaints but continues to complain of chronic leg discomfort.  He denies palpitations or chest pain or dyspnea.  Objective: Vitals:   05/15/19 0400 05/15/19 0500 05/15/19 0600 05/15/19 0700  BP: (!) 107/50 (!) 105/48 (!) 103/49 105/64  Pulse: 100 (!) 101 (!) 101 (!) 103  Resp: (!) 25     Temp:      TempSrc:      SpO2: 96% 97% 99% 100%  Weight:  (!) 136.2 kg    Height:        Intake/Output Summary (Last 24 hours) at 05/15/2019 0732 Last data filed at 05/15/2019 1194 Gross per 24 hour  Intake 840.49 ml  Output 950 ml  Net -109.51 ml   Filed Weights   05/13/19 1155 05/14/19 0500 05/15/19 0500  Weight: (!) 136.7 kg (!) 136.2 kg (!) 136.2 kg    Examination:  He looks chronically unwell.  He is obese.  Heart sounds are present and in atrial fibrillation with a ventricular rate of 90-100.  Saturation adequate.  Lung fields are clear.  Pictures have  been taken of both his legs with ulcerations by my colleague.  I did not examine these legs today.   Data Reviewed: I  have personally reviewed following labs and imaging studies  CBC: Recent Labs  Lab 05/13/19 1359 05/14/19 0610 05/15/19 0531  WBC 16.3* 11.1* 9.3  NEUTROABS 13.9*  --  6.4  HGB 12.8* 11.5* 10.3*  HCT 44.5 40.3 36.6*  MCV 89.0 88.8 90.6  PLT 439* 410* 412*   Basic Metabolic Panel: Recent Labs  Lab 05/13/19 1359 05/13/19 1746 05/14/19 0610 05/15/19 0531  NA 140  --  141 142  K 3.9  --  3.3* 3.4*  CL 101  --  102 102  CO2 29  --  29 30  GLUCOSE 121*  --  133* 151*  BUN 13  --  12 11  CREATININE 0.65  --  0.58* 0.52*  CALCIUM 8.9  --  8.4* 8.3*  MG  --  2.1  --   1.9   GFR: Estimated Creatinine Clearance: 117.3 mL/min (A) (by C-G formula based on SCr of 0.52 mg/dL (L)). Liver Function Tests: Recent Labs  Lab 05/13/19 1359 05/15/19 0531  AST 20 18  ALT 14 12  ALKPHOS 63 50  BILITOT 1.3* 0.4  PROT 6.8 5.5*  ALBUMIN 2.7* 2.2*   No results for input(s): LIPASE, AMYLASE in the last 168 hours. No results for input(s): AMMONIA in the last 168 hours. Coagulation Profile: No results for input(s): INR, PROTIME in the last 168 hours. Cardiac Enzymes: No results for input(s): CKTOTAL, CKMB, CKMBINDEX, TROPONINI in the last 168 hours. BNP (last 3 results) No results for input(s): PROBNP in the last 8760 hours. HbA1C: Recent Labs    05/13/19 1843  HGBA1C 5.9*   CBG: Recent Labs  Lab 05/13/19 2117 05/14/19 0742 05/14/19 1121 05/14/19 1624 05/14/19 2116  GLUCAP 169* 112* 150* 105* 180*   Lipid Profile: No results for input(s): CHOL, HDL, LDLCALC, TRIG, CHOLHDL, LDLDIRECT in the last 72 hours. Thyroid Function Tests: Recent Labs    05/13/19 1746  TSH 1.356   Anemia Panel: No results for input(s): VITAMINB12, FOLATE, FERRITIN, TIBC, IRON, RETICCTPCT in the last 72 hours. Sepsis Labs: Recent Labs  Lab 05/13/19 1843  LATICACIDVEN 1.4    Recent Results (from the past 240 hour(s))  Respiratory Panel by RT PCR (Flu A&B, Covid) - Nasopharyngeal Swab     Status: None   Collection Time: 05/13/19  2:03 PM   Specimen: Nasopharyngeal Swab  Result Value Ref Range Status   SARS Coronavirus 2 by RT PCR NEGATIVE NEGATIVE Final    Comment: (NOTE) SARS-CoV-2 target nucleic acids are NOT DETECTED. The SARS-CoV-2 RNA is generally detectable in upper respiratoy specimens during the acute phase of infection. The lowest concentration of SARS-CoV-2 viral copies this assay can detect is 131 copies/mL. A negative result does not preclude SARS-Cov-2 infection and should not be used as the sole basis for treatment or other patient management  decisions. A negative result may occur with  improper specimen collection/handling, submission of specimen other than nasopharyngeal swab, presence of viral mutation(s) within the areas targeted by this assay, and inadequate number of viral copies (<131 copies/mL). A negative result must be combined with clinical observations, patient history, and epidemiological information. The expected result is Negative. Fact Sheet for Patients:  https://www.moore.com/ Fact Sheet for Healthcare Providers:  https://www.young.biz/ This test is not yet ap proved or cleared by the [place] FDA and  has been authorized for detection and/or diagnosis of SARS-CoV-2 by FDA under an Emergency Use Authorization (EUA). This EUA will remain  in effect (meaning this test can be used) for  the duration of the COVID-19 declaration under Section 564(b)(1) of the Act, 21 U.S.C. section 360bbb-3(b)(1), unless the authorization is terminated or revoked sooner.    Influenza A by PCR NEGATIVE NEGATIVE Final   Influenza B by PCR NEGATIVE NEGATIVE Final    Comment: (NOTE) The Xpert Xpress SARS-CoV-2/FLU/RSV assay is intended as an aid in  the diagnosis of influenza from Nasopharyngeal swab specimens and  should not be used as a sole basis for treatment. Nasal washings and  aspirates are unacceptable for Xpert Xpress SARS-CoV-2/FLU/RSV  testing. Fact Sheet for Patients: https://www.moore.com/ Fact Sheet for Healthcare Providers: https://www.young.biz/ This test is not yet approved or cleared by the [place] FDA and  has been authorized for detection and/or diagnosis of SARS-CoV-2 by  FDA under an Emergency Use Authorization (EUA). This EUA will remain  in effect (meaning this test can be used) for the duration of the  Covid-19 declaration under Section 564(b)(1) of the Act, 21  U.S.C. section 360bbb-3(b)(1), unless the authorization  is  terminated or revoked. Performed at [facility], [address], [place], [place] [address]   MRSA PCR Screening     Status: Abnormal   Collection Time: 05/13/19  5:17 PM   Specimen: Nasal Mucosa; Nasopharyngeal  Result Value Ref Range Status   MRSA by PCR POSITIVE (A) NEGATIVE Final    Comment:        The GeneXpert MRSA Assay (FDA approved for NASAL specimens only), is one component of a comprehensive MRSA colonization surveillance program. It is not intended to diagnose MRSA infection nor to guide or monitor treatment for MRSA infections. RESULT CALLED TO, READ BACK BY AND VERIFIED WITH: Franciso Bend @2129  05/13/19 [facility] Performed at [facility], [address], [place], [place]   Culture, blood (routine x 2)     Status: None (Preliminary result)   Collection Time: 05/13/19  6:34 PM   Specimen: BLOOD LEFT HAND  Result Value Ref Range Status   Specimen Description BLOOD LEFT HAND  Final   Special Requests   Final    BOTTLES DRAWN AEROBIC AND ANAEROBIC Blood Culture adequate volume   Culture   Final    NO GROWTH < 12 HOURS Performed at [facility], [address] [place], [place]    Report Status PENDING  Incomplete  Culture, blood (routine x 2)     Status: None (Preliminary result)   Collection Time: 05/13/19  6:43 PM   Specimen: BLOOD LEFT ARM  Result Value Ref Range Status   Specimen Description BLOOD LEFT ARM  Final   Special Requests   Final    BOTTLES DRAWN AEROBIC AND ANAEROBIC Blood Culture adequate volume   Culture   Final    NO GROWTH < 12 HOURS Performed at [facility], [address], [place], [place]    Report Status PENDING  Incomplete         Radiology Studies: DG Chest Portable 1 View  Result Date: 05/13/2019 CLINICAL DATA:  Weakness, bilateral lower extremity edema EXAM: PORTABLE CHEST 1 VIEW COMPARISON:  07/20/2017 FINDINGS: Single frontal view of the chest demonstrates stable hiatal hernia. Cardiac silhouette  is unremarkable. No airspace disease, effusion, or pneumothorax. IMPRESSION: 1. Stable exam, no acute process. Electronically Signed   By: 09/19/2017 M.D.   On: 05/13/2019 13:11        Scheduled Meds: . apixaban  5 mg Oral BID  . Chlorhexidine Gluconate Cloth  6 each Topical Daily  . Chlorhexidine  Gluconate Cloth  6 each Topical [id number]  . collagenase   Topical Daily  . Gerhardt's butt cream   Topical BID  . hydrocerin   Topical BID  . insulin aspart  0-15 Units Subcutaneous TID WC  . insulin aspart  0-5 Units Subcutaneous QHS  . insulin glargine  23 Units Subcutaneous QPM  . metoprolol tartrate  25 mg Oral BID  . mupirocin ointment  1 application Nasal BID  . potassium chloride  20 mEq Oral Daily  . verapamil  120 mg Oral Daily   Continuous Infusions: . cefTRIAXone (ROCEPHIN)  IV Stopped (05/14/19 1856)  . doxycycline (VIBRAMYCIN) IV Stopped (05/14/19 2313)     LOS: 2 days       Birgit Nowling Normajean Glasgow, MD [facility]   If 7PM-7AM, please contact night-coverage [email or web address]  05/15/2019, 7:32 AM

## 2019-05-16 LAB — COMPREHENSIVE METABOLIC PANEL
ALT: 7 U/L (ref 0–44)
AST: 15 U/L (ref 15–41)
Albumin: 2.2 g/dL — ABNORMAL LOW (ref 3.5–5.0)
Alkaline Phosphatase: 48 U/L (ref 38–126)
Anion gap: 6 (ref 5–15)
BUN: 18 mg/dL (ref 8–23)
CO2: 32 mmol/L (ref 22–32)
Calcium: 8.3 mg/dL — ABNORMAL LOW (ref 8.9–10.3)
Chloride: 103 mmol/L (ref 98–111)
Creatinine, Ser: 0.51 mg/dL — ABNORMAL LOW (ref 0.61–1.24)
GFR calc Af Amer: >60 mL/min (ref >60)
GFR calc non Af Amer: >60 mL/min (ref >60)
Glucose, Bld: 141 mg/dL — ABNORMAL HIGH (ref 70–99)
Potassium: 4.1 mmol/L (ref 3.5–5.1)
Sodium: 141 mmol/L (ref 135–145)
Total Bilirubin: 0.4 mg/dL (ref 0.3–1.2)
Total Protein: 5.9 g/dL — ABNORMAL LOW (ref 6.5–8.1)

## 2019-05-16 LAB — GLUCOSE, CAPILLARY
Glucose-Capillary: 113 mg/dL — ABNORMAL HIGH (ref 70–99)
Glucose-Capillary: 145 mg/dL — ABNORMAL HIGH (ref 70–99)
Glucose-Capillary: 100 mg/dL — ABNORMAL HIGH (ref 70–99)
Glucose-Capillary: 145 mg/dL — ABNORMAL HIGH (ref 70–99)

## 2019-05-16 MED ORDER — METOPROLOL TARTRATE 25 MG PO TABS
25.0000 mg | ORAL_TABLET | ORAL | Status: AC
  Administered 2019-05-16: 25 mg via ORAL
  Filled 2019-05-16: qty 1

## 2019-05-16 MED ORDER — METOPROLOL TARTRATE 50 MG PO TABS
50.0000 mg | ORAL_TABLET | Freq: Two times a day (BID) | ORAL | Status: DC
  Administered 2019-05-16 – 2019-05-19 (×6): 50 mg via ORAL
  Filled 2019-05-16 (×6): qty 1

## 2019-05-16 MED ORDER — ALBUMIN HUMAN 25 % IV SOLN
25.0000 g | Freq: Four times a day (QID) | INTRAVENOUS | Status: AC
  Administered 2019-05-16 – 2019-05-17 (×3): 25 g via INTRAVENOUS
  Administered 2019-05-17: 12.5 g via INTRAVENOUS
  Filled 2019-05-16 (×4): qty 50
  Filled 2019-05-16: qty 100
  Filled 2019-05-16 (×2): qty 50

## 2019-05-16 MED ORDER — FUROSEMIDE 10 MG/ML IJ SOLN
20.0000 mg | Freq: Two times a day (BID) | INTRAMUSCULAR | Status: DC
  Administered 2019-05-16 – 2019-05-19 (×6): 20 mg via INTRAVENOUS
  Filled 2019-05-16 (×6): qty 2

## 2019-05-16 NOTE — Progress Notes (Signed)
Physical Therapy Treatment Patient Details Name: Jonathan Wise MRN: [id number] DOB: July 22, 1945 Today's Date: 05/16/2019    History of Present Illness Jonathan Wise is a 74 y.o. male with medical history significant for atrial fibrillation on chronic anticoagulation, diabetes mellitus, pulmonary hypertension, bedridden.  Patient presented to the ED complaining of chronic diarrhea-intermittent over the past year.  Reports episodes almost every 2 weeks.  Reports recent episode started about 4 days ago.  He reports at least 2 loose stools daily, for which she wears adult diapers, he is bedridden and so his wife has to clean him up twice a day.  Between these he still has bowel movements. He reports loud "grumbling" bowel sounds.He tells me has had chronic lower extremity swelling and dealing with chronic ulcers since 2012.  Over the past 4 days he reports worsening of the ulcers on his bilateral lower extremity L > R.,  with pain.  He also reports a wound on his buttock.No fever no chills.  He specifically denies any difficulty breathing or chest pain.Reports compliance prior to today with his Eliquis, diltiazem and other medications.  Unable to tell me if he has been taking metoprolol.    PT Comments    Patient demonstrates increased tolerance/endurance for sitting up at bedside while completing BLE ROM/strengthening exercises, required active assistance to complete LAQ's with LLE due to weakness, much bleeding noted from bilateral lower legs at site of ace wrapping while in dependent position - RN present and aware.  Patient required Max assist to move legs back onto bed during sit to supine and toleraed lying on side while nursing staff changed his dressings.  Patient will benefit from continued physical therapy in hospital and recommended venue below to increase strength, balance, endurance for safe ADLs and gait.   Follow Up Recommendations  SNF     Equipment Recommendations  None recommended  by PT    Recommendations for Other Services       Precautions / Restrictions Precautions Precautions: Fall Restrictions Weight Bearing Restrictions: No    Mobility  Bed Mobility Overal bed mobility: Needs Assistance Bed Mobility: Supine to Sit;Sit to Supine;Rolling Rolling: Mod assist   Supine to sit: Mod assist;HOB elevated Sit to supine: Mod assist   General bed mobility comments: had to use bed rail and assistance to move legs when rolling, required head of bed raised and use of bed rails for supine to sitting, patient unable to lift legs onto bed during sit to supine requiring Max assist  Transfers                    Ambulation/Gait                 Stairs             Wheelchair Mobility    Modified Rankin (Stroke Patients Only)       Balance Overall balance assessment: Needs assistance Sitting-balance support: Feet supported;No upper extremity supported Sitting balance-Leahy Scale: Fair Sitting balance - Comments: fair static, fair/poor dynamic while seated at bedside, once fatigued patient falls backwards Postural control: Posterior lean                                  Cognition Arousal/Alertness: Awake/alert Behavior During Therapy: WFL for tasks assessed/performed Overall Cognitive Status: Within Functional Limits for tasks assessed  Exercises General Exercises - Lower Extremity Long Arc Quad: Seated;AROM;Strengthening;Both;10 reps;AAROM Hip Flexion/Marching: Seated;AROM;Strengthening;Both;10 reps;AAROM Toe Raises: Seated;AROM;Strengthening;Both;15 reps Heel Raises: Seated;AROM;Strengthening;Both;15 reps    General Comments        Pertinent Vitals/Pain Pain Assessment: Faces Faces Pain Scale: Hurts a little bit Pain Location: right knee Pain Descriptors / Indicators: Sore;Grimacing;Guarding Pain Intervention(s): Limited activity within patient's  tolerance;Monitored during session;Repositioned    Home Living                      Prior Function            PT Goals (current goals can now be found in the care plan section) Acute Rehab PT Goals Patient Stated Goal: increase strength for bed mobility and sitting in chair PT Goal Formulation: With patient/family Time For Goal Achievement: 05/28/19 Potential to Achieve Goals: Good Progress towards PT goals: Progressing toward goals    Frequency    Min 3X/week      PT Plan Current plan remains appropriate    Co-evaluation              AM-PAC PT "6 Clicks" Mobility   Outcome Measure  Help needed turning from your back to your side while in a flat bed without using bedrails?: A Lot Help needed moving from lying on your back to sitting on the side of a flat bed without using bedrails?: A Lot Help needed moving to and from a bed to a chair (including a wheelchair)?: Total Help needed standing up from a chair using your arms (e.g., wheelchair or bedside chair)?: Total Help needed to walk in hospital room?: Total Help needed climbing 3-5 steps with a railing? : Total 6 Click Score: 8    End of Session   Activity Tolerance: Patient tolerated treatment well;Patient limited by fatigue Patient left: in bed;with call bell/phone within reach;with nursing/sitter in room Nurse Communication: Mobility status PT Visit Diagnosis: Muscle weakness (generalized) (M62.81);Unsteadiness on feet (R26.81);Difficulty in walking, not elsewhere classified (R26.2)     Time: 0802-2336 PT Time Calculation (min) (ACUTE ONLY): 39 min  Charges:  $Therapeutic Exercise: 8-22 mins $Therapeutic Activity: 23-37 mins                     3:57 PM, 05/16/19 Lonell Grandchild, MPT Physical Therapist with [facility] [phone] office [phone] mobile phone

## 2019-05-16 NOTE — Care Management Important Message (Signed)
Important Message  Patient Details  Name: Jonathan Wise MRN: [id number] Date of Birth: 1945/09/25   Medicare Important Message Given:  Yes     Corey Harold 05/16/2019, 12:07 PM

## 2019-05-16 NOTE — Progress Notes (Signed)
Pt transferred to unit @ 2300. He was transferred onto air loss mattress per order. This became a safety concern since pt noted to be above raised, side bed rails. Pt stated he was uncomfortable due to mattress pressing against his back. We deflated the mattress, and he stated, "it feels better". When I told him we needed to remove the air loss mattress, he refused and stated, "everything is fine like it is". With mattress deflated, the patient is in a safer bed position with the side rails raised. Will continue to monitor patient.

## 2019-05-16 NOTE — Progress Notes (Signed)
PROGRESS NOTE    Jonathan Wise  XTG:[id number] DOB: 20-Oct-1945 DOA: 05/13/2019 PCP: [name], MD    Brief Narrative:  74 year old and who was admitted to the hospital with rapid atrial fibrillation.  He was on a Cardizem infusion and subsequently transitioned to oral medications.  Anticoagulated with apixaban.  Is also noted to have significant lower extremity edema as well as chronic leg ulcers.  Started on intravenous antibiotics.  Also started on diuretics and IV albumin.  Plan will be to go to skilled nursing facility once his overall swelling is better.   Assessment & Plan:   Principal Problem:   Atrial fibrillation with rapid ventricular response (HCC) Active Problems:   Bilateral lower extremity edema   Venous stasis ulcers (HCC)   Type 2 diabetes mellitus without complication (HCC)   Pulmonary artery hypertension (HCC)   Cellulitis of leg, left   Morbid obesity (HCC)   Current use of long term anticoagulation   Chronic venous stasis dermatitis   Chronic osteoarthritis   Lower extremity weakness   Chronic painful diabetic neuropathy (HCC)   Bedbound   Former heavy tobacco smoker   1. Atrial fibrillation with rapid ventricular response.  Heart rate remains elevated.  Increase metoprolol dosing.  He is anticoagulated with apixaban. 2. Hypokalemia.  Replaced 3. Type 2 diabetes.  Insulin-dependent.  Blood sugar stable.  Continue sliding scale and home dose of basal insulin. 4. Cellulitis of lower extremities.  On Rocephin and doxycycline.  WBC count is improved. 5. Obstructive sleep apnea.  Noncompliant with CPAP. 6. Generalized weakness.  Seen by physical therapy with recommendation for skilled nursing facility. 7. Lower extremity edema/ulcers.  Seen by wound care and compressive dressings have been applied.  Started on intravenous Lasix as well as intravenous albumin.   DVT prophylaxis: Apixaban Code Status: Full code Family Communication: Discussed with  patient Disposition Plan: Skilled nursing facility placement on discharge once he has been diuresed.   Consultants:     Procedures:     Antimicrobials:   Doxycycline 2/2 >  Rocephin 2/2 >   Subjective: Denies any shortness of breath.  No chest pain.  Objective: Vitals:   05/15/19 2315 05/15/19 2331 05/16/19 0520 05/16/19 1739  BP: (!) 125/44  126/60 (!) 147/75  Pulse: (!) 108  98 91  Resp: 16  18 20   Temp: 98.3 F (36.8 C)  98.3 F (36.8 C) 98.6 F (37 C)  TempSrc: Oral  Oral Oral  SpO2: 95%  100% 95%  Weight:  (!) 137.5 kg    Height:  6\' 1"  (1.854 m)      Intake/Output Summary (Last 24 hours) at 05/16/2019 1836 Last data filed at 05/16/2019 1800 Gross per 24 hour  Intake 781.84 ml  Output 300 ml  Net 481.84 ml   Filed Weights   05/14/19 0500 05/15/19 0500 05/15/19 2331  Weight: (!) 136.2 kg (!) 136.2 kg (!) 137.5 kg    Examination:  General exam: Appears calm and comfortable  Respiratory system: Clear to auscultation. Respiratory effort normal. Cardiovascular system: irregular, tachycardic. No JVD, murmurs, rubs, gallops or clicks. No pedal edema. Gastrointestinal system: Abdomen is nondistended, soft and nontender. No organomegaly or masses felt. Normal bowel sounds heard. Central nervous system: Alert and oriented. No focal neurological deficits. Extremities: lower extremities are wrapped in compression dressings Skin: No rashes, lesions or ulcers Psychiatry: Judgement and insight appear normal. Mood & affect appropriate.     Data Reviewed: I have personally reviewed following labs and  imaging studies  CBC: Recent Labs  Lab 05/13/19 1359 05/14/19 0610 05/15/19 0531  WBC 16.3* 11.1* 9.3  NEUTROABS 13.9*  --  6.4  HGB 12.8* 11.5* 10.3*  HCT 44.5 40.3 36.6*  MCV 89.0 88.8 90.6  PLT 439* 410* 412*   Basic Metabolic Panel: Recent Labs  Lab 05/13/19 1359 05/13/19 1746 05/14/19 0610 05/15/19 0531 05/16/19 0618  NA 140  --  141 142 141  K  3.9  --  3.3* 3.4* 4.1  CL 101  --  102 102 103  CO2 29  --  29 30 32  GLUCOSE 121*  --  133* 151* 141*  BUN 13  --  12 11 18   CREATININE 0.65  --  0.58* 0.52* 0.51*  CALCIUM 8.9  --  8.4* 8.3* 8.3*  MG  --  2.1  --  1.9  --    GFR: Estimated Creatinine Clearance: 117.9 mL/min (A) (by C-G formula based on SCr of 0.51 mg/dL (L)). Liver Function Tests: Recent Labs  Lab 05/13/19 1359 05/15/19 0531 05/16/19 0618  AST 20 18 15   ALT 14 12 7   ALKPHOS 63 50 48  BILITOT 1.3* 0.4 0.4  PROT 6.8 5.5* 5.9*  ALBUMIN 2.7* 2.2* 2.2*   No results for input(s): LIPASE, AMYLASE in the last 168 hours. No results for input(s): AMMONIA in the last 168 hours. Coagulation Profile: No results for input(s): INR, PROTIME in the last 168 hours. Cardiac Enzymes: No results for input(s): CKTOTAL, CKMB, CKMBINDEX, TROPONINI in the last 168 hours. BNP (last 3 results) No results for input(s): PROBNP in the last 8760 hours. HbA1C: Recent Labs    05/13/19 1843  HGBA1C 5.9*   CBG: Recent Labs  Lab 05/15/19 1640 05/15/19 2117 05/16/19 0756 05/16/19 1205 05/16/19 1625  GLUCAP 144* 144* 113* 145* 100*   Lipid Profile: No results for input(s): CHOL, HDL, LDLCALC, TRIG, CHOLHDL, LDLDIRECT in the last 72 hours. Thyroid Function Tests: No results for input(s): TSH, T4TOTAL, FREET4, T3FREE, THYROIDAB in the last 72 hours. Anemia Panel: No results for input(s): VITAMINB12, FOLATE, FERRITIN, TIBC, IRON, RETICCTPCT in the last 72 hours. Sepsis Labs: Recent Labs  Lab 05/13/19 1843  LATICACIDVEN 1.4    Recent Results (from the past 240 hour(s))  Respiratory Panel by RT PCR (Flu A&B, Covid) - Nasopharyngeal Swab     Status: None   Collection Time: 05/13/19  2:03 PM   Specimen: Nasopharyngeal Swab  Result Value Ref Range Status   SARS Coronavirus 2 by RT PCR NEGATIVE NEGATIVE Final    Comment: (NOTE) SARS-CoV-2 target nucleic acids are NOT DETECTED. The SARS-CoV-2 RNA is generally detectable in  upper respiratoy specimens during the acute phase of infection. The lowest concentration of SARS-CoV-2 viral copies this assay can detect is 131 copies/mL. A negative result does not preclude SARS-Cov-2 infection and should not be used as the sole basis for treatment or other patient management decisions. A negative result may occur with  improper specimen collection/handling, submission of specimen other than nasopharyngeal swab, presence of viral mutation(s) within the areas targeted by this assay, and inadequate number of viral copies (<131 copies/mL). A negative result must be combined with clinical observations, patient history, and epidemiological information. The expected result is Negative. Fact Sheet for Patients:  07/14/19 Fact Sheet for Healthcare Providers:  07/11/19 This test is not yet ap proved or cleared by the 07/11/19 FDA and  has been authorized for detection and/or diagnosis of SARS-CoV-2 by FDA under an Emergency  Use Authorization (EUA). This EUA will remain  in effect (meaning this test can be used) for the duration of the COVID-19 declaration under Section 564(b)(1) of the Act, 21 U.S.C. section 360bbb-3(b)(1), unless the authorization is terminated or revoked sooner.    Influenza A by PCR NEGATIVE NEGATIVE Final   Influenza B by PCR NEGATIVE NEGATIVE Final    Comment: (NOTE) The Xpert Xpress SARS-CoV-2/FLU/RSV assay is intended as an aid in  the diagnosis of influenza from Nasopharyngeal swab specimens and  should not be used as a sole basis for treatment. Nasal washings and  aspirates are unacceptable for Xpert Xpress SARS-CoV-2/FLU/RSV  testing. Fact Sheet for Patients: https://www.moore.com/ Fact Sheet for Healthcare Providers: https://www.young.biz/ This test is not yet approved or cleared by the [place] FDA and  has been authorized for  detection and/or diagnosis of SARS-CoV-2 by  FDA under an Emergency Use Authorization (EUA). This EUA will remain  in effect (meaning this test can be used) for the duration of the  Covid-19 declaration under Section 564(b)(1) of the Act, 21  U.S.C. section 360bbb-3(b)(1), unless the authorization is  terminated or revoked. Performed at [facility], [address], [place], [place] [address]   MRSA PCR Screening     Status: Abnormal   Collection Time: 05/13/19  5:17 PM   Specimen: Nasal Mucosa; Nasopharyngeal  Result Value Ref Range Status   MRSA by PCR POSITIVE (A) NEGATIVE Final    Comment:        The GeneXpert MRSA Assay (FDA approved for NASAL specimens only), is one component of a comprehensive MRSA colonization surveillance program. It is not intended to diagnose MRSA infection nor to guide or monitor treatment for MRSA infections. RESULT CALLED TO, READ BACK BY AND VERIFIED WITH: S WADE,RN @2129  05/13/19 [facility] Performed at [facility], [address] [place], [place]   Culture, blood (routine x 2)     Status: None (Preliminary result)   Collection Time: 05/13/19  6:34 PM   Specimen: BLOOD LEFT HAND  Result Value Ref Range Status   Specimen Description BLOOD LEFT HAND  Final   Special Requests   Final    BOTTLES DRAWN AEROBIC AND ANAEROBIC Blood Culture adequate volume   Culture   Final    NO GROWTH 3 DAYS Performed at [facility], [address], [place], [place]    Report Status PENDING  Incomplete  Culture, blood (routine x 2)     Status: None (Preliminary result)   Collection Time: 05/13/19  6:43 PM   Specimen: BLOOD LEFT ARM  Result Value Ref Range Status   Specimen Description BLOOD LEFT ARM  Final   Special Requests   Final    BOTTLES DRAWN AEROBIC AND ANAEROBIC Blood Culture adequate volume   Culture   Final    NO GROWTH 3 DAYS Performed at [facility], [address], [place], [place]    Report Status PENDING  Incomplete          Radiology Studies: No results found.      Scheduled Meds: . apixaban  5 mg Oral BID  . Chlorhexidine Gluconate Cloth  6 each Topical Daily  . Chlorhexidine Gluconate Cloth  6 each Topical Q0600  . collagenase   Topical Daily  . feeding supplement (ENSURE ENLIVE)  237 mL Oral Q24H  . feeding supplement (PRO-STAT SUGAR FREE 64)  30 mL Oral TID BM  . furosemide  20 mg Intravenous BID  . Gerhardt's butt  cream   Topical BID  . hydrocerin   Topical BID  . insulin aspart  0-15 Units Subcutaneous TID WC  . insulin aspart  0-5 Units Subcutaneous QHS  . insulin glargine  23 Units Subcutaneous QPM  . metoprolol tartrate  50 mg Oral BID  . multivitamin with minerals  1 tablet Oral Daily  . mupirocin ointment  1 application Nasal BID  . nutrition supplement (JUVEN)  1 packet Oral BID BM  . potassium chloride  20 mEq Oral Daily  . Ensure Max Protein  11 oz Oral Daily  . verapamil  120 mg Oral Daily   Continuous Infusions: . albumin human 25 g (05/16/19 1503)  . cefTRIAXone (ROCEPHIN)  IV Stopped (05/15/19 1824)  . doxycycline (VIBRAMYCIN) IV 100 mg (05/16/19 1006)     LOS: 3 days    Time spent: 64mins    Kathie Dike, MD [facility]   If 7PM-7AM, please contact night-coverage [email or web address]  05/16/2019, 6:36 PM

## 2019-05-17 LAB — URINALYSIS, ROUTINE W REFLEX MICROSCOPIC
Bacteria, UA: NONE SEEN
Bilirubin Urine: NEGATIVE
Glucose, UA: 50 mg/dL — AB
Hgb urine dipstick: NEGATIVE
Ketones, ur: NEGATIVE mg/dL
Nitrite: NEGATIVE
Protein, ur: NEGATIVE mg/dL
Specific Gravity, Urine: 1.011 (ref 1.005–1.030)
pH: 5 (ref 5.0–8.0)

## 2019-05-17 LAB — COMPREHENSIVE METABOLIC PANEL
ALT: 12 U/L (ref 0–44)
AST: 17 U/L (ref 15–41)
Albumin: 3 g/dL — ABNORMAL LOW (ref 3.5–5.0)
Alkaline Phosphatase: 44 U/L (ref 38–126)
Anion gap: 9 (ref 5–15)
BUN: 21 mg/dL (ref 8–23)
CO2: 34 mmol/L — ABNORMAL HIGH (ref 22–32)
Calcium: 9 mg/dL (ref 8.9–10.3)
Chloride: 98 mmol/L (ref 98–111)
Creatinine, Ser: 0.46 mg/dL — ABNORMAL LOW (ref 0.61–1.24)
GFR calc Af Amer: >60 mL/min (ref >60)
GFR calc non Af Amer: >60 mL/min (ref >60)
Glucose, Bld: 157 mg/dL — ABNORMAL HIGH (ref 70–99)
Potassium: 3.9 mmol/L (ref 3.5–5.1)
Sodium: 141 mmol/L (ref 135–145)
Total Bilirubin: 0.4 mg/dL (ref 0.3–1.2)
Total Protein: 6.2 g/dL — ABNORMAL LOW (ref 6.5–8.1)

## 2019-05-17 LAB — GLUCOSE, CAPILLARY
Glucose-Capillary: 121 mg/dL — ABNORMAL HIGH (ref 70–99)
Glucose-Capillary: 172 mg/dL — ABNORMAL HIGH (ref 70–99)
Glucose-Capillary: 134 mg/dL — ABNORMAL HIGH (ref 70–99)
Glucose-Capillary: 136 mg/dL — ABNORMAL HIGH (ref 70–99)

## 2019-05-17 MED ORDER — DOXYCYCLINE HYCLATE 100 MG PO TABS
100.0000 mg | ORAL_TABLET | Freq: Two times a day (BID) | ORAL | Status: DC
  Administered 2019-05-17 – 2019-05-19 (×4): 100 mg via ORAL
  Filled 2019-05-17 (×4): qty 1

## 2019-05-17 MED ORDER — VERAPAMIL HCL ER 240 MG PO TBCR
120.0000 mg | EXTENDED_RELEASE_TABLET | Freq: Three times a day (TID) | ORAL | Status: DC
  Administered 2019-05-17 – 2019-05-19 (×7): 120 mg via ORAL
  Filled 2019-05-17 (×6): qty 1

## 2019-05-17 NOTE — Progress Notes (Signed)
PROGRESS NOTE    Jonathan Wise  XLK:[id number] DOB: 1945-04-18 DOA: 05/13/2019 PCP: [name], MD    Brief Narrative:  74 year old and who was admitted to the hospital with rapid atrial fibrillation.  He was on a Cardizem infusion and subsequently transitioned to oral medications.  Anticoagulated with apixaban.  Is also noted to have significant lower extremity edema as well as chronic leg ulcers.  Started on intravenous antibiotics.  Also started on diuretics and IV albumin.  Plan will be to go to skilled nursing facility once his overall swelling is better.   Assessment & Plan:   Principal Problem:   Atrial fibrillation with rapid ventricular response (HCC) Active Problems:   Bilateral lower extremity edema   Venous stasis ulcers (HCC)   Type 2 diabetes mellitus without complication (HCC)   Pulmonary artery hypertension (HCC)   Cellulitis of leg, left   Morbid obesity (HCC)   Current use of long term anticoagulation   Chronic venous stasis dermatitis   Chronic osteoarthritis   Lower extremity weakness   Chronic painful diabetic neuropathy (HCC)   Bedbound   Former heavy tobacco smoker   1. Atrial fibrillation with rapid ventricular response.  Heart rate remains elevated.  He is on metoprolol and verapamil.  Increase verapamil to home dosing of 120 mg 3 times daily.Marland Kitchen  He is anticoagulated with apixaban. 2. Hypokalemia.  Replaced 3. Type 2 diabetes.  Insulin-dependent.  Blood sugar stable.  Continue sliding scale and home dose of basal insulin. 4. Cellulitis of lower extremities.  On Rocephin and doxycycline.  WBC count is improved. 5. Obstructive sleep apnea.  Noncompliant with CPAP. 6. Generalized weakness.  Seen by physical therapy with recommendation for skilled nursing facility. 7. Lower extremity edema/ulcers.  Seen by wound care and compressive dressings have been applied.  Started on intravenous Lasix as well as intravenous albumin.  Appears to be having good urine  output.  He still has evidence of significant volume overload will need continued diuresis   DVT prophylaxis: Apixaban Code Status: Full code Family Communication: Discussed with patient and wife at the bedside Disposition Plan: Skilled nursing facility placement on discharge once he has been diuresed.   Consultants:     Procedures:     Antimicrobials:   Doxycycline 2/2 >  Rocephin 2/2 >   Subjective: No shortness of breath or chest pain.  He is making good urine overnight.  Has many questions about his discharge disposition.  Heart rate remains elevated in the 110-120 range  Objective: Vitals:   05/17/19 0435 05/17/19 0516 05/17/19 0754 05/17/19 1450  BP: 136/65 128/61  135/64  Pulse: 91 94  93  Resp:  18  18  Temp:  98.2 F (36.8 C)  98.3 F (36.8 C)  TempSrc:  Oral  Oral  SpO2:  90% 91% 94%  Weight:  120.2 kg    Height:        Intake/Output Summary (Last 24 hours) at 05/17/2019 1648 Last data filed at 05/17/2019 1523 Gross per 24 hour  Intake 2380 ml  Output 3000 ml  Net -620 ml   Filed Weights   05/15/19 0500 05/15/19 2331 05/17/19 0516  Weight: (!) 136.2 kg (!) 137.5 kg 120.2 kg    Examination:  General exam: Alert, awake, oriented x 3 Respiratory system: Cl crackles at bases. Respiratory effort normal. Cardiovascular system: Irregular, tachycardic. No murmurs, rubs, gallops. Gastrointestinal system: Abdomen is nondistended, soft and nontender. No organomegaly or masses felt. Normal bowel sounds heard.  Central nervous system: Alert and oriented. No focal neurological deficits. Extremities: Lower extremities are wrapped and compression dressings bilaterally.  1-2+ edema bilaterally Skin: No rashes, lesions or ulcers Psychiatry: Judgement and insight appear normal. Mood & affect appropriate.      Data Reviewed: I have personally reviewed following labs and imaging studies  CBC: Recent Labs  Lab 05/13/19 1359 05/14/19 0610 05/15/19 0531  WBC  16.3* 11.1* 9.3  NEUTROABS 13.9*  --  6.4  HGB 12.8* 11.5* 10.3*  HCT 44.5 40.3 36.6*  MCV 89.0 88.8 90.6  PLT 439* 410* 412*   Basic Metabolic Panel: Recent Labs  Lab 05/13/19 1359 05/13/19 1746 05/14/19 0610 05/15/19 0531 05/16/19 0618 05/17/19 0704  NA 140  --  141 142 141 141  K 3.9  --  3.3* 3.4* 4.1 3.9  CL 101  --  102 102 103 98  CO2 29  --  29 30 32 34*  GLUCOSE 121*  --  133* 151* 141* 157*  BUN 13  --  12 11 18 21   CREATININE 0.65  --  0.58* 0.52* 0.51* 0.46*  CALCIUM 8.9  --  8.4* 8.3* 8.3* 9.0  MG  --  2.1  --  1.9  --   --    GFR: Estimated Creatinine Clearance: 110 mL/min (A) (by C-G formula based on SCr of 0.46 mg/dL (L)). Liver Function Tests: Recent Labs  Lab 05/13/19 1359 05/15/19 0531 05/16/19 0618 05/17/19 0704  AST 20 18 15 17   ALT 14 12 7 12   ALKPHOS 63 50 48 44  BILITOT 1.3* 0.4 0.4 0.4  PROT 6.8 5.5* 5.9* 6.2*  ALBUMIN 2.7* 2.2* 2.2* 3.0*   No results for input(s): LIPASE, AMYLASE in the last 168 hours. No results for input(s): AMMONIA in the last 168 hours. Coagulation Profile: No results for input(s): INR, PROTIME in the last 168 hours. Cardiac Enzymes: No results for input(s): CKTOTAL, CKMB, CKMBINDEX, TROPONINI in the last 168 hours. BNP (last 3 results) No results for input(s): PROBNP in the last 8760 hours. HbA1C: No results for input(s): HGBA1C in the last 72 hours. CBG: Recent Labs  Lab 05/16/19 1625 05/16/19 2142 05/17/19 0728 05/17/19 1106 05/17/19 1605  GLUCAP 100* 145* 121* 172* 134*   Lipid Profile: No results for input(s): CHOL, HDL, LDLCALC, TRIG, CHOLHDL, LDLDIRECT in the last 72 hours. Thyroid Function Tests: No results for input(s): TSH, T4TOTAL, FREET4, T3FREE, THYROIDAB in the last 72 hours. Anemia Panel: No results for input(s): VITAMINB12, FOLATE, FERRITIN, TIBC, IRON, RETICCTPCT in the last 72 hours. Sepsis Labs: Recent Labs  Lab 05/13/19 1843  LATICACIDVEN 1.4    Recent Results (from the past  240 hour(s))  Respiratory Panel by RT PCR (Flu A&B, Covid) - Nasopharyngeal Swab     Status: None   Collection Time: 05/13/19  2:03 PM   Specimen: Nasopharyngeal Swab  Result Value Ref Range Status   SARS Coronavirus 2 by RT PCR NEGATIVE NEGATIVE Final    Comment: (NOTE) SARS-CoV-2 target nucleic acids are NOT DETECTED. The SARS-CoV-2 RNA is generally detectable in upper respiratoy specimens during the acute phase of infection. The lowest concentration of SARS-CoV-2 viral copies this assay can detect is 131 copies/mL. A negative result does not preclude SARS-Cov-2 infection and should not be used as the sole basis for treatment or other patient management decisions. A negative result may occur with  improper specimen collection/handling, submission of specimen other than nasopharyngeal swab, presence of viral mutation(s) within the areas targeted by  this assay, and inadequate number of viral copies (<131 copies/mL). A negative result must be combined with clinical observations, patient history, and epidemiological information. The expected result is Negative. Fact Sheet for Patients:  PinkCheek.be Fact Sheet for Healthcare Providers:  GravelBags.it This test is not yet ap proved or cleared by the [place] FDA and  has been authorized for detection and/or diagnosis of SARS-CoV-2 by FDA under an Emergency Use Authorization (EUA). This EUA will remain  in effect (meaning this test can be used) for the duration of the COVID-19 declaration under Section 564(b)(1) of the Act, 21 U.S.C. section 360bbb-3(b)(1), unless the authorization is terminated or revoked sooner.    Influenza A by PCR NEGATIVE NEGATIVE Final   Influenza B by PCR NEGATIVE NEGATIVE Final    Comment: (NOTE) The Xpert Xpress SARS-CoV-2/FLU/RSV assay is intended as an aid in  the diagnosis of influenza from Nasopharyngeal swab specimens and  should not be used  as a sole basis for treatment. Nasal washings and  aspirates are unacceptable for Xpert Xpress SARS-CoV-2/FLU/RSV  testing. Fact Sheet for Patients: PinkCheek.be Fact Sheet for Healthcare Providers: GravelBags.it This test is not yet approved or cleared by the [place] FDA and  has been authorized for detection and/or diagnosis of SARS-CoV-2 by  FDA under an Emergency Use Authorization (EUA). This EUA will remain  in effect (meaning this test can be used) for the duration of the  Covid-19 declaration under Section 564(b)(1) of the Act, 21  U.S.C. section 360bbb-3(b)(1), unless the authorization is  terminated or revoked. Performed at [facility], [address] [place], [place] [address]   MRSA PCR Screening     Status: Abnormal   Collection Time: 05/13/19  5:17 PM   Specimen: Nasal Mucosa; Nasopharyngeal  Result Value Ref Range Status   MRSA by PCR POSITIVE (A) NEGATIVE Final    Comment:        The GeneXpert MRSA Assay (FDA approved for NASAL specimens only), is one component of a comprehensive MRSA colonization surveillance program. It is not intended to diagnose MRSA infection nor to guide or monitor treatment for MRSA infections. RESULT CALLED TO, READ BACK BY AND VERIFIED WITH: S WADE,RN @2129  05/13/19 [facility] Performed at [facility], [address] [place], [place] [address]   Culture, blood (routine x 2)     Status: None (Preliminary result)   Collection Time: 05/13/19  6:34 PM   Specimen: BLOOD LEFT HAND  Result Value Ref Range Status   Specimen Description BLOOD LEFT HAND  Final   Special Requests   Final    BOTTLES DRAWN AEROBIC AND ANAEROBIC Blood Culture adequate volume   Culture   Final    NO GROWTH 4 DAYS Performed at [facility], [address], [place], [place] [address]    Report Status PENDING  Incomplete  Culture, blood (routine x 2)     Status: None (Preliminary result)   Collection  Time: 05/13/19  6:43 PM   Specimen: BLOOD LEFT ARM  Result Value Ref Range Status   Specimen Description BLOOD LEFT ARM  Final   Special Requests   Final    BOTTLES DRAWN AEROBIC AND ANAEROBIC Blood Culture adequate volume   Culture   Final    NO GROWTH 4 DAYS Performed at [facility], [address] [place], [place] [address]    Report Status PENDING  Incomplete         Radiology Studies: No results found.      Scheduled  Meds: . apixaban  5 mg Oral BID  . Chlorhexidine Gluconate Cloth  6 each Topical Daily  . Chlorhexidine Gluconate Cloth  6 each Topical Q0600  . collagenase   Topical Daily  . doxycycline  100 mg Oral Q12H  . feeding supplement (ENSURE ENLIVE)  237 mL Oral Q24H  . feeding supplement (PRO-STAT SUGAR FREE 64)  30 mL Oral TID BM  . furosemide  20 mg Intravenous BID  . Gerhardt's butt cream   Topical BID  . hydrocerin   Topical BID  . insulin aspart  0-15 Units Subcutaneous TID WC  . insulin aspart  0-5 Units Subcutaneous QHS  . insulin glargine  23 Units Subcutaneous QPM  . metoprolol tartrate  50 mg Oral BID  . multivitamin with minerals  1 tablet Oral Daily  . mupirocin ointment  1 application Nasal BID  . nutrition supplement (JUVEN)  1 packet Oral BID BM  . potassium chloride  20 mEq Oral Daily  . Ensure Max Protein  11 oz Oral Daily  . verapamil  120 mg Oral TID   Continuous Infusions: . cefTRIAXone (ROCEPHIN)  IV 2 g (05/16/19 1854)     LOS: 4 days    Time spent:    Erick Blinks, MD [facility]   If 7PM-7AM, please contact night-coverage [email or web address]  05/17/2019, 4:48 PM

## 2019-05-17 NOTE — Progress Notes (Signed)
Pt is noncompliant with efforts in pressure injury prevention. Pt repeatedly educated of importance to turn Q2 hrs, in order to alleviate pressure on previous injuries. Pt continue to refuse mobility/turns. Education and efforts will continue to be made throughout duration of shift. Will continue to monitor patient.

## 2019-05-18 DIAGNOSIS — L97411 Non-pressure chronic ulcer of right heel and midfoot limited to breakdown of skin: Secondary | ICD-10-CM

## 2019-05-18 DIAGNOSIS — I83014 Varicose veins of right lower extremity with ulcer of heel and midfoot: Secondary | ICD-10-CM

## 2019-05-18 LAB — COMPREHENSIVE METABOLIC PANEL
ALT: 13 U/L (ref 0–44)
AST: 19 U/L (ref 15–41)
Albumin: 3.2 g/dL — ABNORMAL LOW (ref 3.5–5.0)
Alkaline Phosphatase: 52 U/L (ref 38–126)
Anion gap: 11 (ref 5–15)
BUN: 24 mg/dL — ABNORMAL HIGH (ref 8–23)
CO2: 34 mmol/L — ABNORMAL HIGH (ref 22–32)
Calcium: 9.4 mg/dL (ref 8.9–10.3)
Chloride: 93 mmol/L — ABNORMAL LOW (ref 98–111)
Creatinine, Ser: 0.46 mg/dL — ABNORMAL LOW (ref 0.61–1.24)
GFR calc Af Amer: >60 mL/min (ref >60)
GFR calc non Af Amer: >60 mL/min (ref >60)
Glucose, Bld: 146 mg/dL — ABNORMAL HIGH (ref 70–99)
Potassium: 4.2 mmol/L (ref 3.5–5.1)
Sodium: 138 mmol/L (ref 135–145)
Total Bilirubin: 0.6 mg/dL (ref 0.3–1.2)
Total Protein: 6.4 g/dL — ABNORMAL LOW (ref 6.5–8.1)

## 2019-05-18 LAB — GLUCOSE, CAPILLARY
Glucose-Capillary: 135 mg/dL — ABNORMAL HIGH (ref 70–99)
Glucose-Capillary: 142 mg/dL — ABNORMAL HIGH (ref 70–99)
Glucose-Capillary: 119 mg/dL — ABNORMAL HIGH (ref 70–99)
Glucose-Capillary: 154 mg/dL — ABNORMAL HIGH (ref 70–99)

## 2019-05-18 LAB — CULTURE, BLOOD (ROUTINE X 2)
Culture: NO GROWTH
Culture: NO GROWTH
Special Requests: ADEQUATE
Special Requests: ADEQUATE

## 2019-05-18 NOTE — TOC Progression Note (Signed)
Transition of Care [facility]) - Progression Note    Patient Details  Name: TEAGHAN FORMICA MRN: [id number] Date of Birth: 12-Feb-1946  Transition of Care [facility]) CM/SW Contact  Leitha Bleak, RN Phone Number: 05/18/2019, 2:32 PM  Clinical Narrative:   Patient will accept the bed offer at [facility], he is requesting we call [facility] once more, he has stayed there before and that is his first choice. DC planning for early in the week.   Patient will need a hospital  Bed after SNF to return home. MD has ordered.Discussed choices with patient. He is very happy, states he can not return home without a bed.  Melissa with [facility] accepted the referral.    Expected Discharge Plan: Skilled Nursing Facility Barriers to Discharge: Continued Medical Work up, SNF Pending bed offer  Expected Discharge Plan and Services Expected Discharge Plan: Skilled Nursing Facility   Discharge Planning Services: CM Consult Post Acute Care Choice: Nursing Home Living arrangements for the past 2 months: Single Family Home                 DME Arranged: Hospital bed DME Agency: [facility] Date DME Agency Contacted: 05/18/19 Time DME Agency Contacted: 1300 Representative spoke with at DME Agency: Efraim Kaufmann HH Arranged: [facility])

## 2019-05-18 NOTE — Progress Notes (Signed)
PROGRESS NOTE    Jonathan DIEL  PFX:[id number] DOB: 05-May-1945 DOA: 05/13/2019 PCP: [name], MD    Brief Narrative:  74 year old and who was admitted to the hospital with rapid atrial fibrillation.  He was on a Cardizem infusion and subsequently transitioned to oral medications.  Anticoagulated with apixaban.  Is also noted to have significant lower extremity edema as well as chronic leg ulcers.  Started on intravenous antibiotics.  Also started on diuretics and IV albumin.  Plan will be to go to skilled nursing facility once his overall swelling is better.   Assessment & Plan:   Principal Problem:   Atrial fibrillation with rapid ventricular response (HCC) Active Problems:   Bilateral lower extremity edema   Venous stasis ulcers (HCC)   Type 2 diabetes mellitus without complication (HCC)   Pulmonary artery hypertension (HCC)   Cellulitis of leg, left   Morbid obesity (HCC)   Current use of long term anticoagulation   Chronic venous stasis dermatitis   Chronic osteoarthritis   Lower extremity weakness   Chronic painful diabetic neuropathy (HCC)   Bedbound   Former heavy tobacco smoker   1. Atrial fibrillation with rapid ventricular response.  Heart rate has improved.  He is on metoprolol and verapamil. He is anticoagulated with apixaban. 2. Hypokalemia.  Replaced 3. Type 2 diabetes.  Insulin-dependent.  Blood sugar stable.  Continue sliding scale and home dose of basal insulin. 4. Cellulitis of lower extremities.  On Rocephin and doxycycline.  WBC count is improved.  Anticipate discontinuing antibiotics after tomorrow's dose. 5. Obstructive sleep apnea.  Noncompliant with CPAP. 6. Generalized weakness.  Seen by physical therapy with recommendation for skilled nursing facility. 7. Lower extremity edema/ulcers.  Seen by wound care and compressive dressings have been applied.  Started on intravenous Lasix as well as intravenous albumin.  Appears to be having good urine  output.  Overall volume status improving.  Anticipate transitioning to oral diuretics in a.m.   DVT prophylaxis: Apixaban Code Status: Full code Family Communication: Discussed with patient and wife at the bedside Disposition Plan: Skilled nursing facility placement on discharge once he has been diuresed.  Anticipate discharge in the next 24 hours   Consultants:     Procedures:     Antimicrobials:   Doxycycline 2/2 >  Rocephin 2/2 >   Subjective: Heart rate is better controlled.  No shortness of breath or chest pain.  Objective: Vitals:   05/17/19 2051 05/18/19 0509 05/18/19 0825 05/18/19 1516  BP: (!) 146/75 125/69  125/67  Pulse: 100 98  96  Resp: 19 19  18   Temp: 98.4 F (36.9 C) 98.3 F (36.8 C)    TempSrc: Oral     SpO2: 99% 94% 95% 94%  Weight:  117.3 kg    Height:        Intake/Output Summary (Last 24 hours) at 05/18/2019 1757 Last data filed at 05/18/2019 1738 Gross per 24 hour  Intake 960 ml  Output 3700 ml  Net -2740 ml   Filed Weights   05/15/19 2331 05/17/19 0516 05/18/19 0509  Weight: (!) 137.5 kg 120.2 kg 117.3 kg    Examination:  General exam: Alert, awake, oriented x 3 Respiratory system: Clear to auscultation. Respiratory effort normal. Cardiovascular system:RRR. No murmurs, rubs, gallops. Gastrointestinal system: Abdomen is nondistended, soft and nontender. No organomegaly or masses felt. Normal bowel sounds heard. Central nervous system: Alert and oriented. No focal neurological deficits. Extremities: 1+ edema lower extremities bilaterally Skin: Erythema in  lower extremities is significantly improved, wounds appear to be healing Psychiatry: Judgement and insight appear normal. Mood & affect appropriate.       Data Reviewed: I have personally reviewed following labs and imaging studies  CBC: Recent Labs  Lab 05/13/19 1359 05/14/19 0610 05/15/19 0531  WBC 16.3* 11.1* 9.3  NEUTROABS 13.9*  --  6.4  HGB 12.8* 11.5* 10.3*  HCT  44.5 40.3 36.6*  MCV 89.0 88.8 90.6  PLT 439* 410* 412*   Basic Metabolic Panel: Recent Labs  Lab 05/13/19 1359 05/13/19 1746 05/14/19 0610 05/15/19 0531 05/16/19 0618 05/17/19 0704 05/18/19 0645  NA   < >  --  141 142 141 141 138  K   < >  --  3.3* 3.4* 4.1 3.9 4.2  CL   < >  --  102 102 103 98 93*  CO2   < >  --  29 30 32 34* 34*  GLUCOSE   < >  --  133* 151* 141* 157* 146*  BUN   < >  --  12 11 18 21  24*  CREATININE   < >  --  0.58* 0.52* 0.51* 0.46* 0.46*  CALCIUM   < >  --  8.4* 8.3* 8.3* 9.0 9.4  MG  --  2.1  --  1.9  --   --   --    < > = values in this interval not displayed.   GFR: Estimated Creatinine Clearance: 108.7 mL/min (A) (by C-G formula based on SCr of 0.46 mg/dL (L)). Liver Function Tests: Recent Labs  Lab 05/13/19 1359 05/15/19 0531 05/16/19 0618 05/17/19 0704 05/18/19 0645  AST 20 18 15 17 19   ALT 14 12 7 12 13   ALKPHOS 63 50 48 44 52  BILITOT 1.3* 0.4 0.4 0.4 0.6  PROT 6.8 5.5* 5.9* 6.2* 6.4*  ALBUMIN 2.7* 2.2* 2.2* 3.0* 3.2*   No results for input(s): LIPASE, AMYLASE in the last 168 hours. No results for input(s): AMMONIA in the last 168 hours. Coagulation Profile: No results for input(s): INR, PROTIME in the last 168 hours. Cardiac Enzymes: No results for input(s): CKTOTAL, CKMB, CKMBINDEX, TROPONINI in the last 168 hours. BNP (last 3 results) No results for input(s): PROBNP in the last 8760 hours. HbA1C: No results for input(s): HGBA1C in the last 72 hours. CBG: Recent Labs  Lab 05/17/19 1605 05/17/19 2015 05/18/19 0727 05/18/19 1057 05/18/19 1612  GLUCAP 134* 136* 135* 142* 119*   Lipid Profile: No results for input(s): CHOL, HDL, LDLCALC, TRIG, CHOLHDL, LDLDIRECT in the last 72 hours. Thyroid Function Tests: No results for input(s): TSH, T4TOTAL, FREET4, T3FREE, THYROIDAB in the last 72 hours. Anemia Panel: No results for input(s): VITAMINB12, FOLATE, FERRITIN, TIBC, IRON, RETICCTPCT in the last 72 hours. Sepsis  Labs: Recent Labs  Lab 05/13/19 1843  LATICACIDVEN 1.4    Recent Results (from the past 240 hour(s))  Respiratory Panel by RT PCR (Flu A&B, Covid) - Nasopharyngeal Swab     Status: None   Collection Time: 05/13/19  2:03 PM   Specimen: Nasopharyngeal Swab  Result Value Ref Range Status   SARS Coronavirus 2 by RT PCR NEGATIVE NEGATIVE Final    Comment: (NOTE) SARS-CoV-2 target nucleic acids are NOT DETECTED. The SARS-CoV-2 RNA is generally detectable in upper respiratoy specimens during the acute phase of infection. The lowest concentration of SARS-CoV-2 viral copies this assay can detect is 131 copies/mL. A negative result does not preclude SARS-Cov-2 infection and should not be  used as the sole basis for treatment or other patient management decisions. A negative result may occur with  improper specimen collection/handling, submission of specimen other than nasopharyngeal swab, presence of viral mutation(s) within the areas targeted by this assay, and inadequate number of viral copies (<131 copies/mL). A negative result must be combined with clinical observations, patient history, and epidemiological information. The expected result is Negative. Fact Sheet for Patients:  PinkCheek.be Fact Sheet for Healthcare Providers:  GravelBags.it This test is not yet ap proved or cleared by the [place] FDA and  has been authorized for detection and/or diagnosis of SARS-CoV-2 by FDA under an Emergency Use Authorization (EUA). This EUA will remain  in effect (meaning this test can be used) for the duration of the COVID-19 declaration under Section 564(b)(1) of the Act, 21 U.S.C. section 360bbb-3(b)(1), unless the authorization is terminated or revoked sooner.    Influenza A by PCR NEGATIVE NEGATIVE Final   Influenza B by PCR NEGATIVE NEGATIVE Final    Comment: (NOTE) The Xpert Xpress SARS-CoV-2/FLU/RSV assay is intended as an  aid in  the diagnosis of influenza from Nasopharyngeal swab specimens and  should not be used as a sole basis for treatment. Nasal washings and  aspirates are unacceptable for Xpert Xpress SARS-CoV-2/FLU/RSV  testing. Fact Sheet for Patients: PinkCheek.be Fact Sheet for Healthcare Providers: GravelBags.it This test is not yet approved or cleared by the [place] FDA and  has been authorized for detection and/or diagnosis of SARS-CoV-2 by  FDA under an Emergency Use Authorization (EUA). This EUA will remain  in effect (meaning this test can be used) for the duration of the  Covid-19 declaration under Section 564(b)(1) of the Act, 21  U.S.C. section 360bbb-3(b)(1), unless the authorization is  terminated or revoked. Performed at [facility], [address], [place], [place] [address]   MRSA PCR Screening     Status: Abnormal   Collection Time: 05/13/19  5:17 PM   Specimen: Nasal Mucosa; Nasopharyngeal  Result Value Ref Range Status   MRSA by PCR POSITIVE (A) NEGATIVE Final    Comment:        The GeneXpert MRSA Assay (FDA approved for NASAL specimens only), is one component of a comprehensive MRSA colonization surveillance program. It is not intended to diagnose MRSA infection nor to guide or monitor treatment for MRSA infections. RESULT CALLED TO, READ BACK BY AND VERIFIED WITH: S WADE,RN @2129  05/13/19 [facility] Performed at [facility], [address] [place], [place] [address]   Culture, blood (routine x 2)     Status: None   Collection Time: 05/13/19  6:34 PM   Specimen: BLOOD LEFT HAND  Result Value Ref Range Status   Specimen Description BLOOD LEFT HAND  Final   Special Requests   Final    BOTTLES DRAWN AEROBIC AND ANAEROBIC Blood Culture adequate volume   Culture   Final    NO GROWTH 5 DAYS Performed at [facility], [address], [place],  [address]    Report Status 05/18/2019 FINAL  Final   Culture, blood (routine x 2)     Status: None   Collection Time: 05/13/19  6:43 PM   Specimen: BLOOD LEFT ARM  Result Value Ref Range Status   Specimen Description BLOOD LEFT ARM  Final   Special Requests   Final    BOTTLES DRAWN AEROBIC AND ANAEROBIC Blood Culture adequate volume   Culture   Final    NO GROWTH 5 DAYS Performed at [facility], [address], [place], [place] [address]    Report Status 05/18/2019 FINAL  Final         Radiology Studies: No results found.      Scheduled Meds: . apixaban  5 mg Oral BID  . Chlorhexidine Gluconate Cloth  6 each Topical Daily  . Chlorhexidine Gluconate Cloth  6 each Topical Q0600  . collagenase   Topical Daily  . doxycycline  100 mg Oral Q12H  . feeding supplement (ENSURE ENLIVE)  237 mL Oral Q24H  . feeding supplement (PRO-STAT SUGAR FREE 64)  30 mL Oral TID BM  . furosemide  20 mg Intravenous BID  . Gerhardt's butt cream   Topical BID  . hydrocerin   Topical BID  . insulin aspart  0-15 Units Subcutaneous TID WC  . insulin aspart  0-5 Units Subcutaneous QHS  . insulin glargine  23 Units Subcutaneous QPM  . metoprolol tartrate  50 mg Oral BID  . multivitamin with minerals  1 tablet Oral Daily  . mupirocin ointment  1 application Nasal BID  . nutrition supplement (JUVEN)  1 packet Oral BID BM  . potassium chloride  20 mEq Oral Daily  . Ensure Max Protein  11 oz Oral Daily  . verapamil  120 mg Oral TID   Continuous Infusions: . cefTRIAXone (ROCEPHIN)  IV 2 g (05/17/19 1808)     LOS: 5 days    Time spent:    Erick Blinks, MD [facility]   If 7PM-7AM, please contact night-coverage [email or web address]  05/18/2019, 5:57 PM

## 2019-05-19 LAB — COMPREHENSIVE METABOLIC PANEL
ALT: 14 U/L (ref 0–44)
AST: 18 U/L (ref 15–41)
Albumin: 3 g/dL — ABNORMAL LOW (ref 3.5–5.0)
Alkaline Phosphatase: 57 U/L (ref 38–126)
Anion gap: 12 (ref 5–15)
BUN: 32 mg/dL — ABNORMAL HIGH (ref 8–23)
CO2: 33 mmol/L — ABNORMAL HIGH (ref 22–32)
Calcium: 9.5 mg/dL (ref 8.9–10.3)
Chloride: 95 mmol/L — ABNORMAL LOW (ref 98–111)
Creatinine, Ser: 0.57 mg/dL — ABNORMAL LOW (ref 0.61–1.24)
GFR calc Af Amer: >60 mL/min (ref >60)
GFR calc non Af Amer: >60 mL/min (ref >60)
Glucose, Bld: 148 mg/dL — ABNORMAL HIGH (ref 70–99)
Potassium: 4.3 mmol/L (ref 3.5–5.1)
Sodium: 140 mmol/L (ref 135–145)
Total Bilirubin: 0.6 mg/dL (ref 0.3–1.2)
Total Protein: 6.4 g/dL — ABNORMAL LOW (ref 6.5–8.1)

## 2019-05-19 LAB — GLUCOSE, CAPILLARY
Glucose-Capillary: 160 mg/dL — ABNORMAL HIGH (ref 70–99)
Glucose-Capillary: 145 mg/dL — ABNORMAL HIGH (ref 70–99)

## 2019-05-19 LAB — RESPIRATORY PANEL BY RT PCR (FLU A&B, COVID)
Influenza A by PCR: NEGATIVE
Influenza B by PCR: NEGATIVE
SARS Coronavirus 2 by RT PCR: NEGATIVE

## 2019-05-19 MED ORDER — VERAPAMIL HCL ER 120 MG PO TBCR: 120.0000 mg | EXTENDED_RELEASE_TABLET | Freq: Three times a day (TID) | ORAL | Status: DC

## 2019-05-19 MED ORDER — FUROSEMIDE 20 MG PO TABS: 20.0000 mg | ORAL_TABLET | Freq: Every day | ORAL | Status: DC

## 2019-05-19 MED ORDER — METOPROLOL TARTRATE 50 MG PO TABS: 50.0000 mg | ORAL_TABLET | Freq: Two times a day (BID) | ORAL | Status: DC

## 2019-05-19 MED ORDER — GERHARDT'S BUTT CREAM: 1.0000 "application " | TOPICAL_CREAM | Freq: Two times a day (BID) | CUTANEOUS | Status: DC

## 2019-05-19 MED ORDER — FUROSEMIDE 40 MG PO TABS: 40.0000 mg | ORAL_TABLET | Freq: Every day | ORAL | Status: DC

## 2019-05-19 MED ORDER — HYDROCERIN EX CREA: 1.0000 "application " | TOPICAL_CREAM | Freq: Two times a day (BID) | CUTANEOUS | 0 refills | Status: DC

## 2019-05-19 MED ORDER — COLLAGENASE 250 UNIT/GM EX OINT: TOPICAL_OINTMENT | Freq: Every day | CUTANEOUS | 0 refills | Status: DC

## 2019-05-19 MED ORDER — POTASSIUM CHLORIDE 20 MEQ PO PACK: 20.0000 meq | PACK | Freq: Every day | ORAL | Status: DC

## 2019-05-19 NOTE — Care Management Important Message (Signed)
Important Message  Patient Details  Name: MAUREEN DUESING MRN: [id number] Date of Birth: 10/21/1945   Medicare Important Message Given:  Yes     Corey Harold 05/19/2019, 3:46 PM

## 2019-05-19 NOTE — Consult Note (Addendum)
WOC Nurse wound follow up Wound type: venous stasis ulcers to bilateral lower legs and device related injury to mid calf circumferentially.   Pt had Una boots applied on 2/3; refer to previous WOC progress note for assessment and measurements of leg wounds. He states that he had some bleeding on Sat which soiled the compression wraps and they were removed.  He has not been wearing them for the past 3 days and requests that they are not reapplied at this time.  Bedside nurses have been applying Santyl and moist gauze, then dry kerlex Q day, and pt states he would like to continue this plan of care.  Dressings were changed this morning, so I will not remove to assess legs.  He states he has much less edema since keeping legs elevated in the bed during the hospital stay, and plans to discharge to a SNF later today.   Please re-consult if further assistance is needed.  Thank-you,  Cammie Mcgee MSN, RN, CWOCN, [place], CNS [phone]

## 2019-05-19 NOTE — Progress Notes (Signed)
Report called to [place] RN at [facility].

## 2019-05-19 NOTE — Progress Notes (Signed)
  Patient requires frequent re-positioning of the body in ways that cannot be achieved with an ordinary bed or wedge pillow, to eliminate pain, reduce pressure, and the head of the bed to be elevated more than 30 degrees most of the time due to bed bound, morbid obesity, bilateral lower extremity edema, bilateral cellulitis.

## 2019-05-19 NOTE — Discharge Summary (Signed)
Physician Discharge Summary  Jonathan Wise ZOX:[name] DOB: May 26, 1945 DOA: 05/13/2019  PCP: Barbie BannerWilson, Fred H, MD  Admit date: 05/13/2019 Discharge date: 05/19/2019  Admitted From: Home Disposition: Skilled nursing facility  Recommendations for Outpatient Follow-up:  1. Follow up with PCP in 1-2 weeks 2. Please obtain BMP/CBC in one week   Discharge Condition: Stable CODE STATUS: Full code Diet recommendation: Heart healthy, carb modified  Brief/Interim Summary: 74 year old and who was admitted to the hospital with rapid atrial fibrillation.  He was on a Cardizem infusion and subsequently transitioned to oral medications.  Anticoagulated with apixaban.  Is also noted to have significant lower extremity edema as well as chronic leg ulcers.  Started on intravenous antibiotics.  Also started on diuretics and IV albumin.   Discharge Diagnoses:  Principal Problem:   Atrial fibrillation with rapid ventricular response (HCC) Active Problems:   Bilateral lower extremity edema   Venous stasis ulcers (HCC)   Type 2 diabetes mellitus without complication (HCC)   Pulmonary artery hypertension (HCC)   Cellulitis of leg, left   Morbid obesity (HCC)   Current use of long term anticoagulation   Chronic venous stasis dermatitis   Chronic osteoarthritis   Lower extremity weakness   Chronic painful diabetic neuropathy (HCC)   Bedbound   Former heavy tobacco smoker  1. Atrial fibrillation with rapid ventricular response.  Heart rate has improved.  He is on metoprolol and verapamil. He is anticoagulated with apixaban. 2. Hypokalemia.  Replaced 3. Type 2 diabetes.  Insulin-dependent.  Blood sugar stable.  Continue sliding scale and home dose of basal insulin. 4. Cellulitis of lower extremities.  On Rocephin and doxycycline.  WBC count is improved.    He has completed his antibiotics in the hospital.  Overall erythema is better. 5. Obstructive sleep apnea.  Noncompliant with CPAP. 6. Generalized  weakness.  Seen by physical therapy with recommendation for skilled nursing facility. 7. Lower extremity edema/ulcers.  Seen by wound care and compressive dressings have been applied.  Started on intravenous Lasix as well as intravenous albumin.    Patient had good urine output with overall improvement of swelling.  Will transition to oral Lasix.  He will need a repeat basic metabolic panel in 1 week. 8. Hypertension.  Blood pressures currently stable on metoprolol and verapamil.  Hold ACE inhibitor since he is normotensive.  Discharge Instructions  Discharge Instructions    Diet - low sodium heart healthy   Complete by: As directed    Increase activity slowly   Complete by: As directed      Allergies as of 05/19/2019   No Active Allergies     Medication List    STOP taking these medications   Farxiga 5 MG Tabs tablet Generic drug: dapagliflozin propanediol   lisinopril 5 MG tablet Commonly known as: ZESTRIL     TAKE these medications   apixaban 5 MG Tabs tablet Commonly known as: ELIQUIS Take 1 tablet (5 mg total) by mouth 2 (two) times daily.   Cinnamon 500 MG capsule Take 500 mg by mouth daily.   collagenase ointment Commonly known as: SANTYL Apply topically daily.   CRANBERRY FRUIT PO Take 1 tablet by mouth daily.   furosemide 40 MG tablet Commonly known as: LASIX Take 1 tablet (40 mg total) by mouth daily. Start taking on: May 20, 2019   Jonathan Wise butt cream Crea Apply 1 application topically 2 (two) times daily.   hydrocerin Crea Apply 1 application topically 2 (two) times daily.  metoprolol tartrate 50 MG tablet Commonly known as: LOPRESSOR Take 1 tablet (50 mg total) by mouth 2 (two) times daily. What changed:   medication strength  how much to take   potassium chloride 20 MEQ packet Commonly known as: KLOR-CON Take 20 mEq by mouth daily. Start taking on: May 20, 2019   pravastatin 40 MG tablet Commonly known as: PRAVACHOL Take 40  mg by mouth daily.   Evaristo Bury FlexTouch 200 UNIT/ML Sopn Generic drug: Insulin Degludec Inject 23 Units into the skin every evening.   verapamil 120 MG CR tablet Commonly known as: CALAN-SR Take 1 tablet (120 mg total) by mouth 3 (three) times daily.            Durable Medical Equipment  (From admission, onward)         Start     Ordered   05/14/19 1107  For home use only DME Hospital bed  Once    Question Answer Comment  Length of Need Lifetime   The above medical condition requires: Patient requires the ability to reposition frequently   Head must be elevated greater than: 30 degrees   Bed type Semi-electric      05/14/19 1106          No Active Allergies  Consultations:     Procedures/Studies: DG Chest Portable 1 View  Result Date: 05/13/2019 CLINICAL DATA:  Weakness, bilateral lower extremity edema EXAM: PORTABLE CHEST 1 VIEW COMPARISON:  07/20/2017 FINDINGS: Single frontal view of the chest demonstrates stable hiatal hernia. Cardiac silhouette is unremarkable. No airspace disease, effusion, or pneumothorax. IMPRESSION: 1. Stable exam, no acute process. Electronically Signed   By: Sharlet Salina M.D.   On: 05/13/2019 13:11       Subjective: Patient says he feels tired today.  Denies any shortness of breath.  No chest pain.  Discharge Exam: Vitals:   05/18/19 1516 05/18/19 2112 05/19/19 0510 05/19/19 1008  BP: 125/67 121/73 123/62   Pulse: 96 99 96   Resp: 18 18 18    Temp:  98.5 F (36.9 C) 98.4 F (36.9 C)   TempSrc:  Oral    SpO2: 94% 93% 95% 96%  Weight:      Height:        General: Pt is alert, awake, not in acute distress Cardiovascular: RRR, S1/S2 +, no rubs, no gallops Respiratory: CTA bilaterally, no wheezing, no rhonchi Abdominal: Soft, NT, ND, bowel sounds + Extremities: Continues to have chronic edema.  Lower extremity wounds are wrapped in dressings and are overall improving.  Erythema is better.    The results of significant  diagnostics from this hospitalization (including imaging, microbiology, ancillary and laboratory) are listed below for reference.     Microbiology: Recent Results (from the past 240 hour(s))  Respiratory Panel by RT PCR (Flu A&B, Covid) - Nasopharyngeal Swab     Status: None   Collection Time: 05/13/19  2:03 PM   Specimen: Nasopharyngeal Swab  Result Value Ref Range Status   SARS Coronavirus 2 by RT PCR NEGATIVE NEGATIVE Final    Comment: (NOTE) SARS-CoV-2 target nucleic acids are NOT DETECTED. The SARS-CoV-2 RNA is generally detectable in upper respiratoy specimens during the acute phase of infection. The lowest concentration of SARS-CoV-2 viral copies this assay can detect is 131 copies/mL. A negative result does not preclude SARS-Cov-2 infection and should not be used as the sole basis for treatment or other patient management decisions. A negative result may occur with  improper specimen collection/handling, submission  of specimen other than nasopharyngeal swab, presence of viral mutation(s) within the areas targeted by this assay, and inadequate number of viral copies (<131 copies/mL). A negative result must be combined with clinical observations, patient history, and epidemiological information. The expected result is Negative. Fact Sheet for Patients:  https://www.moore.com/ Fact Sheet for Healthcare Providers:  https://www.young.biz/ This test is not yet ap proved or cleared by the [place] FDA and  has been authorized for detection and/or diagnosis of SARS-CoV-2 by FDA under an Emergency Use Authorization (EUA). This EUA will remain  in effect (meaning this test can be used) for the duration of the COVID-19 declaration under Section 564(b)(1) of the Act, 21 U.S.C. section 360bbb-3(b)(1), unless the authorization is terminated or revoked sooner.    Influenza A by PCR NEGATIVE NEGATIVE Final   Influenza B by PCR NEGATIVE NEGATIVE  Final    Comment: (NOTE) The Xpert Xpress SARS-CoV-2/FLU/RSV assay is intended as an aid in  the diagnosis of influenza from Nasopharyngeal swab specimens and  should not be used as a sole basis for treatment. Nasal washings and  aspirates are unacceptable for Xpert Xpress SARS-CoV-2/FLU/RSV  testing. Fact Sheet for Patients: https://www.moore.com/ Fact Sheet for Healthcare Providers: https://www.young.biz/ This test is not yet approved or cleared by the [place] FDA and  has been authorized for detection and/or diagnosis of SARS-CoV-2 by  FDA under an Emergency Use Authorization (EUA). This EUA will remain  in effect (meaning this test can be used) for the duration of the  Covid-19 declaration under Section 564(b)(1) of the Act, 21  U.S.C. section 360bbb-3(b)(1), unless the authorization is  terminated or revoked. Performed at [facility], [address], [place], [place] [address]   MRSA PCR Screening     Status: Abnormal   Collection Time: 05/13/19  5:17 PM   Specimen: Nasal Mucosa; Nasopharyngeal  Result Value Ref Range Status   MRSA by PCR POSITIVE (A) NEGATIVE Final    Comment:        The GeneXpert MRSA Assay (FDA approved for NASAL specimens only), is one component of a comprehensive MRSA colonization surveillance program. It is not intended to diagnose MRSA infection nor to guide or monitor treatment for MRSA infections. RESULT CALLED TO, READ BACK BY AND VERIFIED WITH: S WADE,RN @2129  05/13/19 [facility] Performed at [facility], [address] [place], [place]   Culture, blood (routine x 2)     Status: None   Collection Time: 05/13/19  6:34 PM   Specimen: BLOOD LEFT HAND  Result Value Ref Range Status   Specimen Description BLOOD LEFT HAND  Final   Special Requests   Final    BOTTLES DRAWN AEROBIC AND ANAEROBIC Blood Culture adequate volume   Culture   Final    NO GROWTH 5 DAYS Performed at [facility], [address] [place], [place]    Report Status 05/18/2019 FINAL  Final  Culture, blood (routine x 2)     Status: None   Collection Time: 05/13/19  6:43 PM   Specimen: BLOOD LEFT ARM  Result Value Ref Range Status   Specimen Description BLOOD LEFT ARM  Final   Special Requests   Final    BOTTLES DRAWN AEROBIC AND ANAEROBIC Blood Culture adequate volume   Culture   Final    NO GROWTH 5 DAYS Performed at [facility], [address], [place], [place]    Report Status 05/18/2019 FINAL  Final     Labs: BNP (  last 3 results) Recent Labs    05/13/19 1359  BNP 502.0*   Basic Metabolic Panel: Recent Labs  Lab 05/13/19 1746 05/14/19 0610 05/15/19 0531 05/16/19 0618 05/17/19 0704 05/18/19 0645 05/19/19 0504  NA  --    < > 142 141 141 138 140  K  --    < > 3.4* 4.1 3.9 4.2 4.3  CL  --    < > 102 103 98 93* 95*  CO2  --    < > 30 32 34* 34* 33*  GLUCOSE  --    < > 151* 141* 157* 146* 148*  BUN  --    < > 11 18 21  24* 32*  CREATININE  --    < > 0.52* 0.51* 0.46* 0.46* 0.57*  CALCIUM  --    < > 8.3* 8.3* 9.0 9.4 9.5  MG 2.1  --  1.9  --   --   --   --    < > = values in this interval not displayed.   Liver Function Tests: Recent Labs  Lab 05/15/19 0531 05/16/19 0618 05/17/19 0704 05/18/19 0645 05/19/19 0504  AST 18 15 17 19 18   ALT 12 7 12 13 14   ALKPHOS 50 48 44 52 57  BILITOT 0.4 0.4 0.4 0.6 0.6  PROT 5.5* 5.9* 6.2* 6.4* 6.4*  ALBUMIN 2.2* 2.2* 3.0* 3.2* 3.0*   No results for input(s): LIPASE, AMYLASE in the last 168 hours. No results for input(s): AMMONIA in the last 168 hours. CBC: Recent Labs  Lab 05/13/19 1359 05/14/19 0610 05/15/19 0531  WBC 16.3* 11.1* 9.3  NEUTROABS 13.9*  --  6.4  HGB 12.8* 11.5* 10.3*  HCT 44.5 40.3 36.6*  MCV 89.0 88.8 90.6  PLT 439* 410* 412*   Cardiac Enzymes: No results for input(s): CKTOTAL, CKMB, CKMBINDEX, TROPONINI in the last 168 hours. BNP: Invalid input(s): POCBNP CBG: Recent Labs  Lab  05/18/19 1612 05/18/19 2114 05/19/19 0717 05/19/19 1108 05/19/19 1114  GLUCAP 119* 154* 160* 15* 145*   D-Dimer No results for input(s): DDIMER in the last 72 hours. Hgb A1c No results for input(s): HGBA1C in the last 72 hours. Lipid Profile No results for input(s): CHOL, HDL, LDLCALC, TRIG, CHOLHDL, LDLDIRECT in the last 72 hours. Thyroid function studies No results for input(s): TSH, T4TOTAL, T3FREE, THYROIDAB in the last 72 hours.  Invalid input(s): FREET3 Anemia work up No results for input(s): VITAMINB12, FOLATE, FERRITIN, TIBC, IRON, RETICCTPCT in the last 72 hours. Urinalysis    Component Value Date/Time   COLORURINE YELLOW 05/16/2019 2100   APPEARANCEUR CLEAR 05/16/2019 2100   LABSPEC 1.011 05/16/2019 2100   PHURINE 5.0 05/16/2019 2100   GLUCOSEU 50 (A) 05/16/2019 2100   HGBUR NEGATIVE 05/16/2019 2100   BILIRUBINUR NEGATIVE 05/16/2019 2100   KETONESUR NEGATIVE 05/16/2019 2100   PROTEINUR NEGATIVE 05/16/2019 2100   UROBILINOGEN 0.2 03/15/2014 1254   NITRITE NEGATIVE 05/16/2019 2100   LEUKOCYTESUR TRACE (A) 05/16/2019 2100   Sepsis Labs Invalid input(s): PROCALCITONIN,  WBC,  LACTICIDVEN Microbiology Recent Results (from the past 240 hour(s))  Respiratory Panel by RT PCR (Flu A&B, Covid) - Nasopharyngeal Swab     Status: None   Collection Time: 05/13/19  2:03 PM   Specimen: Nasopharyngeal Swab  Result Value Ref Range Status   SARS Coronavirus 2 by RT PCR NEGATIVE NEGATIVE Final    Comment: (NOTE) SARS-CoV-2 target nucleic acids are NOT DETECTED. The SARS-CoV-2 RNA is generally detectable in upper respiratoy specimens during  the acute phase of infection. The lowest concentration of SARS-CoV-2 viral copies this assay can detect is 131 copies/mL. A negative result does not preclude SARS-Cov-2 infection and should not be used as the sole basis for treatment or other patient management decisions. A negative result may occur with  improper specimen  collection/handling, submission of specimen other than nasopharyngeal swab, presence of viral mutation(s) within the areas targeted by this assay, and inadequate number of viral copies (<131 copies/mL). A negative result must be combined with clinical observations, patient history, and epidemiological information. The expected result is Negative. Fact Sheet for Patients:  PinkCheek.be Fact Sheet for Healthcare Providers:  GravelBags.it This test is not yet ap proved or cleared by the [place] FDA and  has been authorized for detection and/or diagnosis of SARS-CoV-2 by FDA under an Emergency Use Authorization (EUA). This EUA will remain  in effect (meaning this test can be used) for the duration of the COVID-19 declaration under Section 564(b)(1) of the Act, 21 U.S.C. section 360bbb-3(b)(1), unless the authorization is terminated or revoked sooner.    Influenza A by PCR NEGATIVE NEGATIVE Final   Influenza B by PCR NEGATIVE NEGATIVE Final    Comment: (NOTE) The Xpert Xpress SARS-CoV-2/FLU/RSV assay is intended as an aid in  the diagnosis of influenza from Nasopharyngeal swab specimens and  should not be used as a sole basis for treatment. Nasal washings and  aspirates are unacceptable for Xpert Xpress SARS-CoV-2/FLU/RSV  testing. Fact Sheet for Patients: PinkCheek.be Fact Sheet for Healthcare Providers: GravelBags.it This test is not yet approved or cleared by the [place] FDA and  has been authorized for detection and/or diagnosis of SARS-CoV-2 by  FDA under an Emergency Use Authorization (EUA). This EUA will remain  in effect (meaning this test can be used) for the duration of the  Covid-19 declaration under Section 564(b)(1) of the Act, 21  U.S.C. section 360bbb-3(b)(1), unless the authorization is  terminated or revoked. Performed at [facility],  [address] [place], [place] [address]   MRSA PCR Screening     Status: Abnormal   Collection Time: 05/13/19  5:17 PM   Specimen: Nasal Mucosa; Nasopharyngeal  Result Value Ref Range Status   MRSA by PCR POSITIVE (A) NEGATIVE Final    Comment:        The GeneXpert MRSA Assay (FDA approved for NASAL specimens only), is one component of a comprehensive MRSA colonization surveillance program. It is not intended to diagnose MRSA infection nor to guide or monitor treatment for MRSA infections. RESULT CALLED TO, READ BACK BY AND VERIFIED WITH: S WADE,RN @2129  05/13/19 [facility] Performed at [facility], [address] [place], [place] [address]   Culture, blood (routine x 2)     Status: None   Collection Time: 05/13/19  6:34 PM   Specimen: BLOOD LEFT HAND  Result Value Ref Range Status   Specimen Description BLOOD LEFT HAND  Final   Special Requests   Final    BOTTLES DRAWN AEROBIC AND ANAEROBIC Blood Culture adequate volume   Culture   Final    NO GROWTH 5 DAYS Performed at [facility], [address], [place], [place] [address]    Report Status 05/18/2019 FINAL  Final  Culture, blood (routine x 2)     Status: None   Collection Time: 05/13/19  6:43 PM   Specimen: BLOOD LEFT ARM  Result Value Ref Range Status   Specimen Description BLOOD LEFT ARM  Final   Special Requests  Final    BOTTLES DRAWN AEROBIC AND ANAEROBIC Blood Culture adequate volume   Culture   Final    NO GROWTH 5 DAYS Performed at [facility], [address], [place], [place] [address]    Report Status 05/18/2019 FINAL  Final     Time coordinating discharge:  SIGNED:   Erick Blinks, MD  [facility] 05/19/2019, 1:37 PM   If 7PM-7AM, please contact night-coverage [email or web address]

## 2019-05-20 LAB — GLUCOSE, CAPILLARY: Glucose-Capillary: 15 mg/dL — CL (ref 70–99)

## 2019-06-02 ENCOUNTER — Emergency Department (HOSPITAL_COMMUNITY): Payer: Medicare Other

## 2019-06-02 ENCOUNTER — Emergency Department (HOSPITAL_COMMUNITY)
Admission: EM | Admit: 2019-06-02 | Discharge: 2019-06-02 | Disposition: A | Discharge status: Home / Self Care | Payer: Medicare Other | Attending: Emergency Medicine | Admitting: Emergency Medicine

## 2019-06-02 ENCOUNTER — Encounter (HOSPITAL_COMMUNITY): Payer: Self-pay

## 2019-06-02 ENCOUNTER — Other Ambulatory Visit: Payer: Self-pay

## 2019-06-02 DIAGNOSIS — Z87891 Personal history of nicotine dependence: Secondary | ICD-10-CM | POA: Diagnosis not present

## 2019-06-02 DIAGNOSIS — R55 Syncope and collapse: Secondary | ICD-10-CM | POA: Insufficient documentation

## 2019-06-02 DIAGNOSIS — D649 Anemia, unspecified: Secondary | ICD-10-CM | POA: Diagnosis not present

## 2019-06-02 DIAGNOSIS — Z79899 Other long term (current) drug therapy: Secondary | ICD-10-CM | POA: Diagnosis not present

## 2019-06-02 DIAGNOSIS — R0602 Shortness of breath: Secondary | ICD-10-CM | POA: Insufficient documentation

## 2019-06-02 DIAGNOSIS — Z20822 Contact with and (suspected) exposure to covid-19: Secondary | ICD-10-CM | POA: Insufficient documentation

## 2019-06-02 DIAGNOSIS — E119 Type 2 diabetes mellitus without complications: Secondary | ICD-10-CM | POA: Diagnosis not present

## 2019-06-02 DIAGNOSIS — Z7901 Long term (current) use of anticoagulants: Secondary | ICD-10-CM | POA: Insufficient documentation

## 2019-06-02 DIAGNOSIS — Z8679 Personal history of other diseases of the circulatory system: Secondary | ICD-10-CM | POA: Diagnosis not present

## 2019-06-02 DIAGNOSIS — I10 Essential (primary) hypertension: Secondary | ICD-10-CM | POA: Diagnosis not present

## 2019-06-02 HISTORY — DX: Pressure ulcer of unspecified site, unspecified stage: L89.90

## 2019-06-02 HISTORY — DX: Cellulitis, unspecified: L03.90

## 2019-06-02 HISTORY — DX: Sleep apnea, unspecified: G47.30

## 2019-06-02 LAB — CBC WITH DIFFERENTIAL/PLATELET
Abs Immature Granulocytes: 0.04 10*3/uL (ref 0.00–0.07)
Basophils Absolute: 0.1 10*3/uL (ref 0.0–0.1)
Basophils Relative: 1 %
Eosinophils Absolute: 0.3 10*3/uL (ref 0.0–0.5)
Eosinophils Relative: 3 %
HCT: 40.8 % (ref 39.0–52.0)
Hemoglobin: 11.7 g/dL — ABNORMAL LOW (ref 13.0–17.0)
Immature Granulocytes: 0 %
Lymphocytes Relative: 12 %
Lymphs Abs: 1.6 10*3/uL (ref 0.7–4.0)
MCH: 26 pg (ref 26.0–34.0)
MCHC: 28.7 g/dL — ABNORMAL LOW (ref 30.0–36.0)
MCV: 90.7 fL (ref 80.0–100.0)
Monocytes Absolute: 1.1 10*3/uL — ABNORMAL HIGH (ref 0.1–1.0)
Monocytes Relative: 8 %
Neutro Abs: 9.8 10*3/uL — ABNORMAL HIGH (ref 1.7–7.7)
Neutrophils Relative %: 76 %
Platelets: 425 10*3/uL — ABNORMAL HIGH (ref 150–400)
RBC: 4.5 MIL/uL (ref 4.22–5.81)
RDW: 17.1 % — ABNORMAL HIGH (ref 11.5–15.5)
WBC: 12.9 10*3/uL — ABNORMAL HIGH (ref 4.0–10.5)
nRBC: 0 % (ref 0.0–0.2)

## 2019-06-02 LAB — COMPREHENSIVE METABOLIC PANEL
ALT: 12 U/L (ref 0–44)
AST: 20 U/L (ref 15–41)
Albumin: 3.3 g/dL — ABNORMAL LOW (ref 3.5–5.0)
Alkaline Phosphatase: 82 U/L (ref 38–126)
Anion gap: 8 (ref 5–15)
BUN: 24 mg/dL — ABNORMAL HIGH (ref 8–23)
CO2: 35 mmol/L — ABNORMAL HIGH (ref 22–32)
Calcium: 9 mg/dL (ref 8.9–10.3)
Chloride: 93 mmol/L — ABNORMAL LOW (ref 98–111)
Creatinine, Ser: 0.81 mg/dL (ref 0.61–1.24)
GFR calc Af Amer: >60 mL/min (ref >60)
GFR calc non Af Amer: >60 mL/min (ref >60)
Glucose, Bld: 94 mg/dL (ref 70–99)
Potassium: 5.1 mmol/L (ref 3.5–5.1)
Sodium: 136 mmol/L (ref 135–145)
Total Bilirubin: 1.1 mg/dL (ref 0.3–1.2)
Total Protein: 7.2 g/dL (ref 6.5–8.1)

## 2019-06-02 LAB — BRAIN NATRIURETIC PEPTIDE: B Natriuretic Peptide: 241 pg/mL — ABNORMAL HIGH (ref 0.0–100.0)

## 2019-06-02 LAB — POC SARS CORONAVIRUS 2 AG -  ED: SARS Coronavirus 2 Ag: NEGATIVE

## 2019-06-02 MED ORDER — SODIUM CHLORIDE 0.9 % IV BOLUS
1000.0000 mL | Freq: Once | INTRAVENOUS | Status: AC
  Administered 2019-06-02: 500 mL via INTRAVENOUS

## 2019-06-02 MED ORDER — FUROSEMIDE 10 MG/ML IJ SOLN
20.0000 mg | Freq: Once | INTRAMUSCULAR | Status: AC
  Administered 2019-06-02: 20:00:00 20 mg via INTRAVENOUS
  Filled 2019-06-02: qty 2

## 2019-06-02 NOTE — ED Notes (Signed)
[facility] notified for transport

## 2019-06-02 NOTE — ED Triage Notes (Signed)
Pt resident of [facility].  Staff reports pt was getting PT in the bed and had a syncopal episode.  Reports o2 sat dropped to 74% on 3liters, HR 39, BP 94/53, temp 97.4.  Reports had been c/o sob since yesterday and was started on o2 at 3liters last night.  Pt c/o nausea earlier today and was given zofran around 1pm.  EMS arrived and o2 sat was 96% on 2liters, hr 48 initially.  HR 62 as ems arrived.

## 2019-06-02 NOTE — ED Notes (Signed)
Discharge information went over with Nurse at [facility], April. April verbally stating she understands discharge information. Pt unable to sign paperwork for discharge instructions.

## 2019-06-02 NOTE — Discharge Instructions (Addendum)
Changed to Lopressor pill so you are taking 25 mg twice a day.  Increase your Lasix for 3 days so you are taking 80 mg in the morning instead of the 40 mg.  Do this for only 3 days.  Usually oxygen at 2 L if oxygen level goes below 90%

## 2019-06-02 NOTE — ED Provider Notes (Signed)
[facility] EMERGENCY DEPARTMENT Provider Note   CSN: [id number] Arrival date & time: 06/02/19  1621     History Chief Complaint  Patient presents with  . Near Syncope    Jonathan Wise is a 74 y.o. male.  Patient was getting out of the bed and had a near syncopal episode at the nursing home.  He had low oxygen at the time.  The history is provided by the patient and the EMS personnel. No language interpreter was used.  Near Syncope This is a new problem. The current episode started less than 1 hour ago. The problem occurs rarely. The problem has been resolved. Pertinent negatives include no chest pain, no abdominal pain and no headaches. Nothing aggravates the symptoms. Nothing relieves the symptoms.       Past Medical History:  Diagnosis Date  . Atrial fibrillation (HCC)    On Pradaxa  . Bilateral lower extremity edema   . Cellulitis   . DVT (deep venous thrombosis) (HCC) 1994   LLE. Completed tx with coumadin  . Hyperlipidemia   . Hypertension   . Kidney stones   . Morbid obesity (HCC)   . Pressure ulcer   . Pulmonary artery hypertension (HCC) 10/18/2011  . Sleep apnea   . Type 2 diabetes mellitus (HCC)   . Urinary retention 10/15/2011  . Venous stasis ulcers [facility])     Patient Active Problem List   Diagnosis Date Noted  . 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 Bedbound 05/14/2019  . Former heavy tobacco smoker 05/14/2019  . Atrial fibrillation with rapid ventricular response (HCC) 05/13/2019  . Chronic osteoarthritis 01/01/2017  . Lower extremity weakness 01/01/2017  . Anemia of chronic disease 12/25/2015  . Candidal intertrigo 12/22/2015  . 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 Bradycardia 03/15/2014  . Lactic acidosis 03/15/2014  . Atrial fibrillation (HCC) 06/23/2012  . PVC's (premature ventricular contractions) 06/23/2012  . Cellulitis of leg, left 06/22/2012  . Sepsis due to pneumonia (HCC) 06/22/2012  . Morbid obesity  (HCC) 06/22/2012  . Osteoarthritis of right knee 06/22/2012  . Current use of long term anticoagulation 06/22/2012  . Pulmonary artery hypertension (HCC) 10/18/2011  . Bilateral lower extremity edema 10/14/2011  . Hypotension 10/14/2011  . Venous stasis ulcers (HCC) 10/14/2011  . Type 2 diabetes mellitus without complication (HCC) 10/14/2011    Past Surgical History:  Procedure Laterality Date  . CYSTOSCOPY  10/18/2011   Procedure: CYSTOSCOPY FLEXIBLE;  Surgeon: Ky Barban, MD;  Location: [facility];  Service: Urology;  Laterality: N/A;  . HERNIA REPAIR     Umbilical       Family History  Problem Relation Age of Onset  . Diabetes Mother   . Emphysema Father     Social History   Tobacco Use  . Smoking status: Former Games developer  . Smokeless tobacco: Never Used  Substance Use Topics  . Alcohol use: No    Alcohol/week: 0.0 standard drinks  . Drug use: No    Home Medications Prior to Admission medications   Medication Sig Start Date End Date Taking? Authorizing Provider  apixaban (ELIQUIS) 5 MG TABS tablet Take 1 tablet (5 mg total) by mouth 2 (two) times daily. 07/22/17   Darlin Drop, DO  Cinnamon 500 MG capsule Take 500 mg by mouth daily.    [provider]  collagenase (SANTYL) ointment Apply topically daily. 05/19/19   Erick Blinks, MD  CRANBERRY FRUIT PO Take 1 tablet by mouth daily.  [provider]  furosemide (LASIX) 40 MG tablet Take 1 tablet (40 mg total) by mouth daily. 05/20/19   Kathie Dike, MD  hydrocerin (EUCERIN) CREA Apply 1 application topically 2 (two) times daily. 05/19/19   Kathie Dike, MD  Hydrocortisone (GERHARDT'S BUTT CREAM) CREA Apply 1 application topically 2 (two) times daily. 05/19/19   Kathie Dike, MD  Insulin Degludec (TRESIBA FLEXTOUCH) 200 UNIT/ML SOPN Inject 23 Units into the skin every evening.     [provider]  metoprolol tartrate (LOPRESSOR) 50 MG tablet Take 1 tablet (50 mg total) by mouth 2 (two)  times daily. 05/19/19   Kathie Dike, MD  potassium chloride (KLOR-CON) 20 MEQ packet Take 20 mEq by mouth daily. 05/20/19   Kathie Dike, MD  pravastatin (PRAVACHOL) 40 MG tablet Take 40 mg by mouth daily.    [provider]  verapamil (CALAN-SR) 120 MG CR tablet Take 1 tablet (120 mg total) by mouth 3 (three) times daily. 05/19/19   Kathie Dike, MD    Allergies    Patient has no known allergies.  Review of Systems   Review of Systems  Constitutional: Negative for appetite change and fatigue.  HENT: Negative for congestion, ear discharge and sinus pressure.   Eyes: Negative for discharge.  Respiratory: Negative for cough.   Cardiovascular: Positive for near-syncope. Negative for chest pain.  Gastrointestinal: Negative for abdominal pain and diarrhea.  Genitourinary: Negative for frequency and hematuria.  Musculoskeletal: Negative for back pain.  Skin: Negative for rash.  Neurological: Positive for dizziness. Negative for seizures and headaches.  Psychiatric/Behavioral: Negative for hallucinations.    Physical Exam Updated Vital Signs BP (!) 142/84   Pulse 67   Resp (!) 27   SpO2 (!) 88%   Physical Exam Vitals and nursing note reviewed.  Constitutional:      Appearance: He is well-developed.  HENT:     Head: Normocephalic.     Nose: Nose normal.  Eyes:     General: No scleral icterus.    Conjunctiva/sclera: Conjunctivae normal.  Neck:     Thyroid: No thyromegaly.  Cardiovascular:     Heart sounds: No murmur. No friction rub. No gallop.      Comments: Bradycardia with your regular rate Pulmonary:     Breath sounds: No stridor. No wheezing or rales.  Chest:     Chest wall: No tenderness.  Abdominal:     General: There is no distension.     Tenderness: There is no abdominal tenderness. There is no rebound.  Musculoskeletal:        General: Normal range of motion.     Cervical back: Neck supple.  Lymphadenopathy:     Cervical: No cervical adenopathy.    Skin:    Findings: No erythema or rash.  Neurological:     Mental Status: He is oriented to person, place, and time.     Motor: No abnormal muscle tone.     Coordination: Coordination normal.  Psychiatric:        Behavior: Behavior normal.     ED Results / Procedures / Treatments   Labs (all labs ordered are listed, but only abnormal results are displayed) Labs Reviewed  CBC WITH DIFFERENTIAL/PLATELET - Abnormal; Notable for the following components:      Result Value   WBC 12.9 (*)    Hemoglobin 11.7 (*)    MCHC 28.7 (*)    RDW 17.1 (*)    Platelets 425 (*)    Neutro Abs 9.8 (*)  Monocytes Absolute 1.1 (*)    All other components within normal limits  BRAIN NATRIURETIC PEPTIDE - Abnormal; Notable for the following components:   B Natriuretic Peptide 241.0 (*)    All other components within normal limits  COMPREHENSIVE METABOLIC PANEL - Abnormal; Notable for the following components:   Chloride 93 (*)    CO2 35 (*)    BUN 24 (*)    Albumin 3.3 (*)    All other components within normal limits  POC SARS CORONAVIRUS 2 AG -  ED    EKG None  Radiology DG Chest Portable 1 View  Result Date: 06/02/2019 CLINICAL DATA:  Shortness of breath EXAM: PORTABLE CHEST 1 VIEW COMPARISON:  05/13/2019 FINDINGS: Cardiomegaly with central vascular congestion. Development of bilateral pleural effusions and basilar consolidations. No pneumothorax. IMPRESSION: Development of bilateral pleural effusions and bibasilar consolidations. Cardiomegaly with mild central congestion. Electronically Signed   By: Jasmine Pang M.D.   On: 06/02/2019 18:18    Procedures Procedures (including critical care time)  Medications Ordered in ED Medications  sodium chloride 0.9 % bolus 1,000 mL (0 mLs Intravenous Stopped 06/02/19 1943)  furosemide (LASIX) injection 20 mg (20 mg Intravenous Given 06/02/19 1946)    ED Course  I have reviewed the triage vital signs and the nursing notes.  Pertinent labs &  imaging results that were available during my care of the patient were reviewed by me and considered in my medical decision making (see chart for details).    MDM Rules/Calculators/A&P                      EKG shows atrial fib with a bradycardic rate.  We will decrease his Lopressor from 50 mg twice a day and make it 25 mg twice a day.  Patient is mildly hypoxic with O2 sats in the upper 80s.  Chest x-ray shows some fluid in his lungs.  He takes Lasix 40 mg a day we will increase it to 80 mg a day for 3 days.  Labs are unremarkable except for mild leukocytosis and mild anemia.  Patient will continue using 2 L oxygen at home if O2 sats fall below 90 Final Clinical Impression(s) / ED Diagnoses Final diagnoses:  Near syncope    Rx / DC Orders ED Discharge Orders    None       Bethann Berkshire, MD 06/02/19 [phone]

## 2019-06-02 NOTE — ED Notes (Signed)
Pt oxygen decreases when falling asleep. EDP notified. Ordered to place pt on 2L [place].

## 2019-06-17 ENCOUNTER — Emergency Department (HOSPITAL_COMMUNITY)
Admission: EM | Admit: 2019-06-17 | Discharge: 2019-06-19 | Disposition: A | Discharge status: Home / Self Care | Payer: Medicare Other | Attending: Emergency Medicine | Admitting: Emergency Medicine

## 2019-06-17 ENCOUNTER — Encounter (HOSPITAL_COMMUNITY): Payer: Self-pay | Admitting: Emergency Medicine

## 2019-06-17 ENCOUNTER — Other Ambulatory Visit: Payer: Self-pay

## 2019-06-17 DIAGNOSIS — Z7901 Long term (current) use of anticoagulants: Secondary | ICD-10-CM | POA: Diagnosis not present

## 2019-06-17 DIAGNOSIS — L8940 Pressure ulcer of contiguous site of back, buttock and hip, unspecified stage: Secondary | ICD-10-CM

## 2019-06-17 DIAGNOSIS — Z20822 Contact with and (suspected) exposure to covid-19: Secondary | ICD-10-CM | POA: Diagnosis not present

## 2019-06-17 DIAGNOSIS — E785 Hyperlipidemia, unspecified: Secondary | ICD-10-CM | POA: Insufficient documentation

## 2019-06-17 DIAGNOSIS — I10 Essential (primary) hypertension: Secondary | ICD-10-CM | POA: Insufficient documentation

## 2019-06-17 DIAGNOSIS — Z7984 Long term (current) use of oral hypoglycemic drugs: Secondary | ICD-10-CM | POA: Insufficient documentation

## 2019-06-17 DIAGNOSIS — I4891 Unspecified atrial fibrillation: Secondary | ICD-10-CM | POA: Insufficient documentation

## 2019-06-17 DIAGNOSIS — E1140 Type 2 diabetes mellitus with diabetic neuropathy, unspecified: Secondary | ICD-10-CM | POA: Diagnosis not present

## 2019-06-17 DIAGNOSIS — Z7401 Bed confinement status: Secondary | ICD-10-CM | POA: Insufficient documentation

## 2019-06-17 DIAGNOSIS — Z79899 Other long term (current) drug therapy: Secondary | ICD-10-CM | POA: Insufficient documentation

## 2019-06-17 DIAGNOSIS — L8945 Pressure ulcer of contiguous site of back, buttock and hip, unstageable: Secondary | ICD-10-CM | POA: Diagnosis present

## 2019-06-17 DIAGNOSIS — L8990 Pressure ulcer of unspecified site, unspecified stage: Secondary | ICD-10-CM | POA: Insufficient documentation

## 2019-06-17 LAB — COMPREHENSIVE METABOLIC PANEL
ALT: 11 U/L (ref 0–44)
AST: 16 U/L (ref 15–41)
Albumin: 3.3 g/dL — ABNORMAL LOW (ref 3.5–5.0)
Alkaline Phosphatase: 66 U/L (ref 38–126)
Anion gap: 9 (ref 5–15)
BUN: 29 mg/dL — ABNORMAL HIGH (ref 8–23)
CO2: 31 mmol/L (ref 22–32)
Calcium: 8.9 mg/dL (ref 8.9–10.3)
Chloride: 101 mmol/L (ref 98–111)
Creatinine, Ser: 0.61 mg/dL (ref 0.61–1.24)
GFR calc Af Amer: >60 mL/min (ref >60)
GFR calc non Af Amer: >60 mL/min (ref >60)
Glucose, Bld: 75 mg/dL (ref 70–99)
Potassium: 4.6 mmol/L (ref 3.5–5.1)
Sodium: 141 mmol/L (ref 135–145)
Total Bilirubin: 0.8 mg/dL (ref 0.3–1.2)
Total Protein: 6.9 g/dL (ref 6.5–8.1)

## 2019-06-17 LAB — CBC WITH DIFFERENTIAL/PLATELET
Abs Immature Granulocytes: 0.03 10*3/uL (ref 0.00–0.07)
Basophils Absolute: 0 10*3/uL (ref 0.0–0.1)
Basophils Relative: 0 %
Eosinophils Absolute: 0.3 10*3/uL (ref 0.0–0.5)
Eosinophils Relative: 3 %
HCT: 41.4 % (ref 39.0–52.0)
Hemoglobin: 11.7 g/dL — ABNORMAL LOW (ref 13.0–17.0)
Immature Granulocytes: 0 %
Lymphocytes Relative: 8 %
Lymphs Abs: 0.9 10*3/uL (ref 0.7–4.0)
MCH: 26.6 pg (ref 26.0–34.0)
MCHC: 28.3 g/dL — ABNORMAL LOW (ref 30.0–36.0)
MCV: 94.1 fL (ref 80.0–100.0)
Monocytes Absolute: 0.9 10*3/uL (ref 0.1–1.0)
Monocytes Relative: 8 %
Neutro Abs: 9.4 10*3/uL — ABNORMAL HIGH (ref 1.7–7.7)
Neutrophils Relative %: 81 %
Platelets: 299 10*3/uL (ref 150–400)
RBC: 4.4 MIL/uL (ref 4.22–5.81)
RDW: 16 % — ABNORMAL HIGH (ref 11.5–15.5)
WBC: 11.5 10*3/uL — ABNORMAL HIGH (ref 4.0–10.5)
nRBC: 0 % (ref 0.0–0.2)

## 2019-06-17 LAB — CBG MONITORING, ED: Glucose-Capillary: 116 mg/dL — ABNORMAL HIGH (ref 70–99)

## 2019-06-17 LAB — LACTIC ACID, PLASMA: Lactic Acid, Venous: 0.9 mmol/L (ref 0.5–1.9)

## 2019-06-17 MED ORDER — INSULIN GLARGINE 100 UNIT/ML ~~LOC~~ SOLN
24.0000 [IU] | Freq: Every day | SUBCUTANEOUS | Status: DC
  Administered 2019-06-17 – 2019-06-18 (×2): 24 [IU] via SUBCUTANEOUS
  Filled 2019-06-17 (×3): qty 0.24

## 2019-06-17 MED ORDER — VERAPAMIL HCL ER 240 MG PO TBCR
240.0000 mg | EXTENDED_RELEASE_TABLET | Freq: Every day | ORAL | Status: DC
  Administered 2019-06-17: 240 mg via ORAL
  Filled 2019-06-17 (×6): qty 1

## 2019-06-17 MED ORDER — METFORMIN HCL 500 MG PO TABS
500.0000 mg | ORAL_TABLET | Freq: Two times a day (BID) | ORAL | Status: DC
  Administered 2019-06-18 – 2019-06-19 (×3): 500 mg via ORAL
  Filled 2019-06-17 (×3): qty 1

## 2019-06-17 MED ORDER — INSULIN DEGLUDEC 200 UNIT/ML ~~LOC~~ SOPN: 23.0000 [IU] | PEN_INJECTOR | Freq: Every evening | SUBCUTANEOUS | Status: DC

## 2019-06-17 MED ORDER — LISINOPRIL 5 MG PO TABS
5.0000 mg | ORAL_TABLET | Freq: Every day | ORAL | Status: DC
  Administered 2019-06-17: 5 mg via ORAL
  Filled 2019-06-17 (×2): qty 1

## 2019-06-17 MED ORDER — APIXABAN 5 MG PO TABS
5.0000 mg | ORAL_TABLET | Freq: Two times a day (BID) | ORAL | Status: DC
  Administered 2019-06-17 – 2019-06-19 (×4): 5 mg via ORAL
  Filled 2019-06-17 (×4): qty 1

## 2019-06-17 MED ORDER — METOPROLOL TARTRATE 50 MG PO TABS
50.0000 mg | ORAL_TABLET | Freq: Two times a day (BID) | ORAL | Status: DC
  Administered 2019-06-17: 50 mg via ORAL
  Filled 2019-06-17 (×2): qty 1

## 2019-06-17 MED ORDER — PRAVASTATIN SODIUM 40 MG PO TABS
40.0000 mg | ORAL_TABLET | Freq: Every day | ORAL | Status: DC
  Administered 2019-06-17 – 2019-06-19 (×3): 40 mg via ORAL
  Filled 2019-06-17 (×6): qty 1

## 2019-06-17 NOTE — [place] FL2 (Signed)
MEDICAID FL2 LEVEL OF CARE SCREENING TOOL     IDENTIFICATION  Patient Name: Jonathan Wise Birthdate: 1945/11/11 Sex: male Admission Date (Current Location): 06/17/2019  [facility] and [place] Number:  [employer] and Address:  [facility],  [address], [place] [address]      Provider Number: [id number]  Attending Physician Name and Address:  Maia Plan, MD  Relative Name and Phone Number:       Current Level of Care: Hospital Recommended Level of Care: Skilled Nursing Facility Prior Approval Number:    Date Approved/Denied:   PASRR Number: [id number]  Discharge Plan: SNF    Current Diagnoses: Patient Active Problem List   Diagnosis Date Noted  . 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 Bedbound 05/14/2019  . Former heavy tobacco smoker 05/14/2019  . Atrial fibrillation with rapid ventricular response (HCC) 05/13/2019  . Chronic osteoarthritis 01/01/2017  . Lower extremity weakness 01/01/2017  . Anemia of chronic disease 12/25/2015  . Candidal intertrigo 12/22/2015  . 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 Bradycardia 03/15/2014  . Lactic acidosis 03/15/2014  . Atrial fibrillation (HCC) 06/23/2012  . PVC's (premature ventricular contractions) 06/23/2012  . Cellulitis of leg, left 06/22/2012  . Sepsis due to pneumonia (HCC) 06/22/2012  . Morbid obesity (HCC) 06/22/2012  . Osteoarthritis of right knee 06/22/2012  . Current use of long term anticoagulation 06/22/2012  . Pulmonary artery hypertension (HCC) 10/18/2011  . Bilateral lower extremity edema 10/14/2011  . Hypotension 10/14/2011  . Venous stasis ulcers (HCC) 10/14/2011  . Type 2 diabetes mellitus without complication (HCC) 10/14/2011    Orientation RESPIRATION BLADDER Height & Weight     Self, Time, Situation, Place  Normal Continent Weight: 268 lb (121.6 kg) Height:  6\' 1"  (185.4 cm)  BEHAVIORAL SYMPTOMS/MOOD NEUROLOGICAL BOWEL  NUTRITION STATUS      Continent Diet(Regular)  AMBULATORY STATUS COMMUNICATION OF NEEDS Skin   Total Care Verbally Normal(decubitis ulcers on his buttocks and sacrum)                       Personal Care Assistance Level of Assistance  Dressing, Bathing Bathing Assistance: Maximum assistance   Dressing Assistance: Maximum assistance     Functional Limitations Info  Sight, Hearing, Speech Sight Info: Adequate Hearing Info: Adequate Speech Info: Adequate    SPECIAL CARE FACTORS FREQUENCY  OT (By licensed OT), PT (By licensed PT)     PT Frequency: 5 OT Frequency: 5            Contractures Contractures Info: Not present    Additional Factors Info  Code Status, Allergies Code Status Info: FULL Code Allergies Info: No Known Allergies           Current Medications (06/17/2019):  This is the current hospital active medication list No current facility-administered medications for this encounter.   Current Outpatient Medications  Medication Sig Dispense Refill  . apixaban (ELIQUIS) 5 MG TABS tablet Take 1 tablet (5 mg total) by mouth 2 (two) times daily. 60 tablet 0  . collagenase (SANTYL) ointment Apply topically daily. (Patient taking differently: Apply 1 application topically daily. ) 15 g 0  . hydrocerin (EUCERIN) CREA Apply 1 application topically 2 (two) times daily.  0  . Hydrocortisone (GERHARDT'S BUTT CREAM) CREA Apply 1 application topically 2 (two) times daily.    . Insulin Degludec (TRESIBA FLEXTOUCH) 200 UNIT/ML SOPN Inject 23 Units into the skin every evening.     08/17/2019  lisinopril (ZESTRIL) 5 MG tablet Take 5 mg by mouth daily.    . metFORMIN (GLUCOPHAGE) 500 MG tablet Take 500 mg by mouth 2 (two) times daily with a meal.    . metoprolol tartrate (LOPRESSOR) 50 MG tablet Take 1 tablet (50 mg total) by mouth 2 (two) times daily.    . pravastatin (PRAVACHOL) 40 MG tablet Take 40 mg by mouth daily.    . verapamil (CALAN-SR) 120 MG CR tablet Take 1 tablet (120 mg  total) by mouth 3 (three) times daily. (Patient taking differently: Take 240 mg by mouth daily. )    . furosemide (LASIX) 40 MG tablet Take 1 tablet (40 mg total) by mouth daily. (Patient not taking: Reported on 06/17/2019)    . potassium chloride (KLOR-CON) 20 MEQ packet Take 20 mEq by mouth daily. (Patient not taking: Reported on 06/17/2019)       Discharge Medications: Please see discharge summary for a list of discharge medications.  Relevant Imaging Results:  Relevant Lab Results:   Additional Information [id number]  Alphonse Guild Myrical Andujo, LCSW

## 2019-06-17 NOTE — Progress Notes (Addendum)
Per [place] MUST pt has a PASRR   [id number]  Start Date: 12/27/15 - No End Date  EDP agreeable to placing PT consult as per EPD, placement attempt for safety is necessary for pt care.  CSW will continue to follow for D/C needs.  Dorothe Pea. Maci Eickholt, LCSW, LCAS, CSI Transitions of Care Clinical Social Worker Care Coordination Department Ph: [phone]

## 2019-06-17 NOTE — Progress Notes (Signed)
CSW spoke to EPD who states family is here due to pt's complaints of being unable to care for himself due to being bedbound and as a result, worsening bedsores, and that while pt's HH Service is attempting placement for SNF from home, they [facility]) has sent the pt to the ED due to their [facility]) belief that pt will need a 3-day inpatient stay to utilize their [facility] Medicare.  Of note:  [facility] Medicare does not require a 3-day inpatient stay for a SNF stay.  CSW will continue to follow for D/C needs.  Dorothe Pea. Adah Stoneberg, LCSW, LCAS, CSI Transitions of Care Clinical Social Worker Care Coordination Department Ph: [phone]

## 2019-06-17 NOTE — ED Provider Notes (Signed)
Emergency Department Provider Note   I have reviewed the triage vital signs and the nursing notes.   HISTORY  Chief Complaint decubitus ulcers   HPI Jonathan Wise is a 74 y.o. male with past medical history reviewed below presents to the emergency department with report of worsening decubitus ulcers.  Patient is bed-bound and has history of  decubitus ulcers in the past. He reports some pain in the area but denies fever or drainage.  Patient states he was admitted for A. Fib/RVR recently and spent some time in rehab after that admission.  He was discharged home with a hospital bed but states that it was too small and he could not use it.  He has home health come to his house and his primarily care for by his wife.  Both have noticed return of his decubitus ulcers along with diffuse redness.  Home health has been managing but after evaluating him today referred him back to the emergency department.   Past Medical History:  Diagnosis Date  . Atrial fibrillation ([place])    On Pradaxa  . Bilateral lower extremity edema   . Cellulitis   . DVT (deep venous thrombosis) (HCC) 1994   LLE. Completed tx with coumadin  . Hyperlipidemia   . Hypertension   . Kidney stones   . Morbid obesity ([place])   . Pressure ulcer   . Pulmonary artery hypertension ([place]) 10/18/2011  . Sleep apnea   . Type 2 diabetes mellitus ([place])   . Urinary retention 10/15/2011  . Venous stasis ulcers [facility])     Patient Active Problem List   Diagnosis Date Noted  . Chronic painful diabetic neuropathy ([place]) 05/14/2019  . Bedbound 05/14/2019  . Former heavy tobacco smoker 05/14/2019  . Atrial fibrillation with rapid ventricular response ([place]) 05/13/2019  . Chronic osteoarthritis 01/01/2017  . Lower extremity weakness 01/01/2017  . Anemia of chronic disease 12/25/2015  . Candidal intertrigo 12/22/2015  . 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 Bradycardia 03/15/2014  . Lactic acidosis 03/15/2014  . Atrial  fibrillation ([place]) 06/23/2012  . PVC's (premature ventricular contractions) 06/23/2012  . Cellulitis of leg, left 06/22/2012  . Sepsis due to pneumonia ([place]) 06/22/2012  . Morbid obesity ([place]) 06/22/2012  . Osteoarthritis of right knee 06/22/2012  . Current use of [facility] Novosad term anticoagulation 06/22/2012  . Pulmonary artery hypertension ([place]) 10/18/2011  . Bilateral lower extremity edema 10/14/2011  . Hypotension 10/14/2011  . Venous stasis ulcers ([place]) 10/14/2011  . Type 2 diabetes mellitus without complication ([place]) 66/44/0347    Past Surgical History:  Procedure Laterality Date  . CYSTOSCOPY  10/18/2011   Procedure: CYSTOSCOPY FLEXIBLE;  Surgeon: Marissa Nestle, MD;  Location: [facility];  Service: Urology;  Laterality: N/A;  . HERNIA REPAIR     Umbilical    Allergies Patient has no known allergies.  Family History  Problem Relation Age of Onset  . Diabetes Mother   . Emphysema Father     Social History Social History   Tobacco Use  . Smoking status: Former Research scientist (life sciences)  . Smokeless tobacco: Never Used  Substance Use Topics  . Alcohol use: No    Alcohol/week: 0.0 standard drinks  . Drug use: No    Review of Systems  Constitutional: No fever/chills Eyes: No visual changes. ENT: No sore throat. Cardiovascular: Denies chest pain. Respiratory: Denies shortness of breath. Gastrointestinal: No abdominal pain.  No nausea, no vomiting.  No diarrhea.  No constipation. Genitourinary: Negative for dysuria. Musculoskeletal: Negative  for back pain. Skin: Reports worsening decubitus ulcers.  Neurological: Negative for headaches, focal weakness or numbness.  10-point ROS otherwise negative.  ____________________________________________   PHYSICAL EXAM:  VITAL SIGNS: ED Triage Vitals  Enc Vitals Group     BP 06/17/19 1522 102/66     Pulse Rate 06/17/19 1522 (!) 104     Resp 06/17/19 1522 19     Temp 06/17/19 1522 98.8 F (37.1 C)     Temp Source 06/17/19 1522 Oral      SpO2 06/17/19 1519 97 %     Weight 06/17/19 1520 268 lb (121.6 kg)     Height 06/17/19 1520 6\' 1"  (1.854 m)   Constitutional: Alert and oriented. Well appearing and in no acute distress. Eyes: Conjunctivae are normal. Head: Atraumatic. Nose: No congestion/rhinnorhea. Mouth/Throat: Mucous membranes are moist.   Neck: No stridor.   Cardiovascular: Tachycardia. Good peripheral circulation. Grossly normal heart sounds.   Respiratory: Normal respiratory effort.  No retractions. Lungs CTAB. Gastrointestinal: Soft and nontender. No distention.  Musculoskeletal: No lower extremity tenderness nor edema. No gross deformities of extremities. Neurologic:  Normal speech and language. No gross focal neurologic deficits are appreciated.  Skin:  Skin is warm and dry.  The patient's lower back and sacral along with upper gluteal regions are erythematous but blanching. Not consistent with cellulitis. No candida type presentation.  No induration and no abscess.  Minimal skin breakdown at the surface.  No active bleeding, biofilm, purulent drainage.    ____________________________________________   LABS (all labs ordered are listed, but only abnormal results are displayed)  Labs Reviewed  COMPREHENSIVE METABOLIC PANEL - Abnormal; Notable for the following components:      Result Value   BUN 29 (*)    Albumin 3.3 (*)    All other components within normal limits  CBC WITH DIFFERENTIAL/PLATELET - Abnormal; Notable for the following components:   WBC 11.5 (*)    Hemoglobin 11.7 (*)    MCHC 28.3 (*)    RDW 16.0 (*)    Neutro Abs 9.4 (*)    All other components within normal limits  CULTURE, BLOOD (ROUTINE X 2)  CULTURE, BLOOD (ROUTINE X 2)  SARS CORONAVIRUS 2 (TAT 6-24 HRS)  LACTIC ACID, PLASMA    ____________________________________________   PROCEDURES  Procedure(s) performed:   Procedures  None  ____________________________________________   INITIAL IMPRESSION / ASSESSMENT AND PLAN /  ED COURSE  Pertinent labs & imaging results that were available during my care of the patient were reviewed by me and considered in my medical decision making (see chart for details).   Patient presents emergency department for evaluation of decubitus wounds returning with diffuse erythema.  Patient has erythema posteriorly with some areas of minor skin breakdown.  The areas are warm to touch.  No abscess or deep, draining/bleeding ulcerations.  Doubt osteomyelitis of the spine/pelvis clinically.  Plan for screening lab work and reassess.  Patient's vital signs are within normal limits.  Labs reviewed which show very mild leukocytosis.  Do not think that the patient's skin findings suggest acute infection requiring antibiotics at this time.  Plan for skin care to the area.  I have reached out to social work, , spoken to the patient by phone.  I have placed an order for physical therapy to evaluate the patient in the morning.  Patient does not require a qualifying stay with his insurance.  FL 2 has been completed and signed.  Unfortunately, patient is on a good care  situation at home and discharge for placement from home is not an option at this time.  Will seek placement from the emergency department. Home meds and diet ordered.  ____________________________________________  FINAL CLINICAL IMPRESSION(S) / ED DIAGNOSES  Final diagnoses:  Pressure injury of skin of contiguous region involving back, buttock, and hip, unspecified injury stage, unspecified laterality     MEDICATIONS GIVEN DURING THIS VISIT:  Medications  apixaban (ELIQUIS) tablet 5 mg (has no administration in time range)  lisinopril (ZESTRIL) tablet 5 mg (has no administration in time range)  metFORMIN (GLUCOPHAGE) tablet 500 mg (has no administration in time range)  metoprolol tartrate (LOPRESSOR) tablet 50 mg (has no administration in time range)  pravastatin (PRAVACHOL) tablet 40 mg (has no administration in time range)   verapamil (CALAN-SR) CR tablet 240 mg (has no administration in time range)  insulin glargine (LANTUS) injection 24 Units (has no administration in time range)    Note:  This document was prepared using Dragon voice recognition software and may include unintentional dictation errors.  Alona Bene, MD, [facility] Emergency [facility]    Jashawna Reever, Arlyss Repress, MD 06/17/19 2042

## 2019-06-17 NOTE — TOC Initial Note (Signed)
Transition of Care [facility]) - Initial/Assessment Note    Patient Details  Name: Jonathan Wise MRN: [id number] Date of Birth: 12-20-1945  Transition of Care [facility]) CM/SW Contact:    Claudine Mouton, LCSW Phone Number: 06/17/2019, 7:22 PM  Clinical Narrative:         CSW was updated by the EPD who states placement attempt is needed for safety from the ED as pt's family cannot care for the pt at home.   CSW spoke to the pt who stated he is working with a Education officer, museum with an Forensic psychologist in [place] who is trying to help get you into a nursing home and that due to the pt recently D/C'ing from [facility], the pt believes the pt may be paying out of pocket for SNF care.    Pt states pt would prefer to utilize pt's insurance but states pt is willing to private pay.  CSW called Debbie at :[facility] for info on any, if any remaining days for SNF are still available from [facility] and left a HIPPA-compliant message at ph: [phone] and requested a call back.  Pt's preferences are  (in order) are [place] in [place], [facility] in [place] in [place].  Pt stated, "We don't want to go to [facility] in [place] as it it, "too far".  CSW voiced understanding.  CSW will continue to follow for D/C needs.  CSW met with pt and confirmed pt's plan to be discharged to SNF for rehab at discharge.  CSW provided active listening and validated pt's concerns that pt's preferences are attempted first with placement.   CSW was given permission to complete FL-2 and send referrals out to SNF facilities via the hub per pt's request.  Pt has been living independently prior to being admitted to [facility].         Expected Discharge Plan: (P) Skilled Nursing Facility Barriers to Discharge: (P) Continued Medical Work up, Ship broker   Patient Goals and CMS Choice        Expected Discharge Plan and Services Expected Discharge Plan: (P) [place]                                             Prior Living Arrangements/Services                       Activities of Daily Living      Permission Sought/Granted                  Emotional Assessment              Admission diagnosis:  Bed Sores Patient Active Problem List   Diagnosis Date Noted  . Chronic painful diabetic neuropathy ([place]) 05/14/2019  . Bedbound 05/14/2019  . Former heavy tobacco smoker 05/14/2019  . Atrial fibrillation with rapid ventricular response ([place]) 05/13/2019  . Chronic osteoarthritis 01/01/2017  . Lower extremity weakness 01/01/2017  . Anemia of chronic disease 12/25/2015  . Candidal intertrigo 12/22/2015  . 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 Bradycardia 03/15/2014  . Lactic acidosis 03/15/2014  . Atrial fibrillation ([place]) 06/23/2012  . PVC's (premature ventricular contractions) 06/23/2012  . Cellulitis of leg, left 06/22/2012  . Sepsis due to pneumonia ([place]) 06/22/2012  . Morbid obesity ([place]) 06/22/2012  . Osteoarthritis of right knee 06/22/2012  .  Current use of long term anticoagulation 06/22/2012  . Pulmonary artery hypertension ([employer] [employer]) 10/18/2011  . Bilateral lower extremity edema 10/14/2011  . Hypotension 10/14/2011  . Venous stasis ulcers ([place]) 10/14/2011  . Type 2 diabetes mellitus without complication ([place]) 42/90/3795   PCP:  Christain Sacramento, MD Pharmacy[facility]   [id number] [place] [address]Phone: [phone]: [phone]    Social Determinants of Health (SDOH) Interventions    Readmission Risk Interventions No flowsheet data found.    CSW will continue to follow for D/C needs.  JAlphonse Guild Baraa Tubbs, LCSW, LCAS, CSI Transitions of Care Clinical Social Worker Care Coordination Department Ph: [phone]   ]

## 2019-06-17 NOTE — Progress Notes (Signed)
CSW, with pt's verbal permission, sent pt's referrals out to SNF's in and out of [place] and to two of pt's preferences:  1. [facility] in [place] 2. [facility] 3. [facility] (now known as: [facility] Preferred SNF)  CSW will continue to follow for D/C needs.  Dorothe Pea. Courteny Egler, LCSW, LCAS, CSI Transitions of Care Clinical Social Worker Care Coordination Department Ph: [phone]

## 2019-06-17 NOTE — ED Triage Notes (Signed)
Patient has decubitus ulcers and his family was concerned they are looking worse. EMS states they look the best they have ever looked after working with him for the last several months.

## 2019-06-17 NOTE — Progress Notes (Signed)
CSW was updated by the EPD who states placement attempt is needed for safety from the ED as pt's family cannot care for the pt at home.   CSW spoke to the pt who stated he is working with a Child psychotherapist with an Pensions consultant in [place] who is trying to help get you into a nursing home and that due to the pt recently D/C'ing from [facility], the pt believes the pt may be paying out of pocket for SNF care.    Pt states pt would prefer to utilize pt's insurance but states pt is willing to private pay.  CSW called Debbie at :[facility] for info on any, if any remaining days for SNF are still available from [facility] and left a HIPPA-compliant message at ph: [phone] and requested a call back.  Pt's preferences are  (in order) are [facility] in [place], [facility] in [place] and the [facility] in [place].  Pt stated, "We don't want to go to [facility] in [place] as it it, "too far".  CSW voiced understanding.  CSW will continue to follow for D/C needs.  Dorothe Pea. Kyleigha Markert, LCSW, LCAS, CSI Transitions of Care Clinical Social Worker Care Coordination Department Ph: [phone]     ]

## 2019-06-17 NOTE — Progress Notes (Addendum)
CSW spoke to pt's RN CM Hope with [facility] that has integrated at ph: at ph: [phone] and was updated that the process is beginning for them to assist the pt with, "switching over to Medicaid".  Hope voiced understanding that an attempt to place pt from the ED is underway and that pt/Hope proposes to "spend down" and switch to Medicaid.  CSW will continue to follow for D/C needs.  Dorothe Pea. Britani Beattie, LCSW, LCAS, CSI Transitions of Care Clinical Social Worker Care Coordination Department Ph: [phone]

## 2019-06-18 LAB — CBG MONITORING, ED
Glucose-Capillary: 99 mg/dL (ref 70–99)
Glucose-Capillary: 99 mg/dL (ref 70–99)
Glucose-Capillary: 80 mg/dL (ref 70–99)
Glucose-Capillary: 145 mg/dL — ABNORMAL HIGH (ref 70–99)

## 2019-06-18 LAB — SARS CORONAVIRUS 2 (TAT 6-24 HRS): SARS Coronavirus 2: NEGATIVE

## 2019-06-18 MED ORDER — SODIUM CHLORIDE 0.9 % IV BOLUS
1000.0000 mL | Freq: Once | INTRAVENOUS | Status: AC
  Administered 2019-06-18: 1000 mL via INTRAVENOUS

## 2019-06-18 NOTE — Clinical Social Work Note (Addendum)
CSW reviewed patient's SNF bed offers and previous notes indicated patient's first choice for SNF was [facility]. CSW spoke with Eunice Blase at [facility] and was informed patient only has 2 of his 20 days left before patient is responsible for a daily copay of $184/day for days 21-45 and then patient's copay will return to $0/day for days 46-100. Debbie informed CSW she would follow up with administration to see if patient would be approved or not. Spoke with patient and explained patient would be responsible for the copay for SNF placement and patient reported he was agreeable to paying out of pocket. CSW discussed completing a Medicaid application and patient reported he is in the process of completing the application with [facility], [phone], in [place] and is planning on going to a nursing home for long-term placement.  Addendum: at 4:18pm CSW called back Hope and was informed Hope spoke with Eunice Blase to confirm a Medicaid application is in process. CSW left voicemail for Debbie to follow up with possible SNF placement for patient.  [name], LCSW Transitions of Care Clinical Social Worker [facility] Ph: [phone]

## 2019-06-18 NOTE — Evaluation (Addendum)
Physical Therapy Evaluation Patient Details Name: Jonathan Wise MRN: [id number] DOB: 1945/12/14 Today's Date: 06/18/2019   History of Present Illness  Jonathan Wise is a 74 y.o. male with past medical history reviewed below presents to the emergency department with report of worsening decubitus ulcers.  Patient is bed-bound and has history of  decubitus ulcers in the past. He reports some pain in the area but denies fever or drainage.  Patient states he was admitted for A. Fib/RVR recently and spent some time in rehab after that admission.  He was discharged home with a hospital bed but states that it was too small and he could not use it.  He has home health come to his house and his primarily care for by his wife.  Both have noticed return of his decubitus ulcers along with diffuse redness.  Home health has been managing but after evaluating him today referred him back to the emergency department.     Clinical Impression  Patient demonstrates slow labored movement for sitting up at bedside, had to use bed rail to help pull self to sitting, occasional falling backwards while seated at bedside when not supporting self with BUE, limited for lifting legs against gravity due to weakness and unable to attempt sit to stands due to fall risk.  Patient put back to bed with fair good return for using BUE to help reposition self.  Patient will benefit from continued physical therapy in hospital and recommended venue below to increase strength, balance, endurance for safe ADLs and gait.    Follow Up Recommendations SNF    Equipment Recommendations  None recommended by PT    Recommendations for Other Services       Precautions / Restrictions Precautions Precautions: Fall Precaution Comments: decubitus ulcers on buttocks Restrictions Weight Bearing Restrictions: No      Mobility  Bed Mobility Overal bed mobility: Needs Assistance Bed Mobility: Supine to Sit;Sit to Supine     Supine to sit:  Mod assist Sit to supine: Mod assist;Max assist   General bed mobility comments: requires assistance to move legs due to weakness  Transfers                    Ambulation/Gait                Stairs            Wheelchair Mobility    Modified Rankin (Stroke Patients Only)       Balance Overall balance assessment: Needs assistance Sitting-balance support: No upper extremity supported;Feet supported Sitting balance-Leahy Scale: Poor Sitting balance - Comments: fair/poor unsupported, fair supported with BUE                                     Pertinent Vitals/Pain Pain Assessment: Faces Faces Pain Scale: Hurts a little bit Pain Location: buttocks Pain Descriptors / Indicators: Sore Pain Intervention(s): Limited activity within patient's tolerance;Monitored during session;Repositioned    Home Living Family/patient expects to be discharged to:: Private residence Living Arrangements: Spouse/significant other Available Help at Discharge: Family;Available 24 hours/day Type of Home: House Home Access: Ramped entrance     Home Layout: One level Home Equipment: Walker - 2 wheels;Cane - single point;Bedside commode;Wheelchair - manual Additional Comments: has hoyer lift    Prior Function Level of Independence: Needs assistance   Gait / Transfers Assistance Needed: non-ambulatory, uses hoyer lift for transfers,  was able to stand using lift chair and RW to be cleaned by spouse  ADL's / Homemaking Assistance Needed: assisted by family        Hand Dominance        Extremity/Trunk Assessment   Upper Extremity Assessment Upper Extremity Assessment: Generalized weakness    Lower Extremity Assessment Lower Extremity Assessment: Generalized weakness    Cervical / Trunk Assessment Cervical / Trunk Assessment: Normal  Communication   Communication: No difficulties  Cognition Arousal/Alertness: Awake/alert Behavior During Therapy: WFL  for tasks assessed/performed Overall Cognitive Status: Within Functional Limits for tasks assessed                                        General Comments      Exercises     Assessment/Plan    PT Assessment Patient needs continued PT services  PT Problem List Decreased strength;Decreased activity tolerance;Decreased balance;Decreased mobility       PT Treatment Interventions Therapuetic exercise, therapuetic activities, functional mobility, balance training, patient/family education, wheelchair mobility training   PT Goals (Current goals can be found in the Care Plan section)  Acute Rehab PT Goals Patient Stated Goal: return home after rehab PT Goal Formulation: With patient Time For Goal Achievement: 07/02/19 Potential to Achieve Goals: Good    Frequency 2 X/week   Barriers to discharge        Co-evaluation               AM-PAC PT "6 Clicks" Mobility  Outcome Measure Help needed turning from your back to your side while in a flat bed without using bedrails?: A Lot Help needed moving from lying on your back to sitting on the side of a flat bed without using bedrails?: A Lot Help needed moving to and from a bed to a chair (including a wheelchair)?: Total Help needed standing up from a chair using your arms (e.g., wheelchair or bedside chair)?: Total Help needed to walk in hospital room?: Total Help needed climbing 3-5 steps with a railing? : Total 6 Click Score: 8    End of Session   Activity Tolerance: Patient tolerated treatment well;Patient limited by fatigue Patient left: in bed;with call bell/phone within reach Nurse Communication: Mobility status PT Visit Diagnosis: Unsteadiness on feet (R26.81);Other abnormalities of gait and mobility (R26.89);Muscle weakness (generalized) (M62.81)    Time: 0762-2633 PT Time Calculation (min) (ACUTE ONLY): 27 min   Charges:   PT Evaluation $PT Eval Moderate Complexity: 1 Mod PT  Treatments $Therapeutic Activity: 23-37 mins        9:53 AM, 06/18/19 Lonell Grandchild, MPT Physical Therapist with [facility] [phone] office [phone] mobile phone

## 2019-06-18 NOTE — Plan of Care (Signed)
  Problem: Acute Rehab PT Goals(only PT should resolve) Goal: Pt Will Go Supine/Side To Sit Outcome: Progressing Flowsheets (Taken 06/18/2019 0941) Pt will go Supine/Side to Sit:  with minimal assist  with moderate assist Goal: Patient Will Perform Sitting Balance Outcome: Progressing Flowsheets (Taken 06/18/2019 0941) Patient will perform sitting balance: with modified independence Goal: Patient Will Transfer Sit To/From Stand Outcome: Progressing Flowsheets (Taken 06/18/2019 0941) Patient will transfer sit to/from stand:  with moderate assist  with maximum assist Goal: Pt Will Transfer Bed To Chair/Chair To Bed Outcome: Progressing Flowsheets (Taken 06/18/2019 0941) Pt will Transfer Bed to Chair/Chair to Bed:  with mod assist  with max assist Note: Using sliding board if possible   9:42 AM, 06/18/19 Ocie Bob, MPT Physical Therapist with [facility] [phone] office [phone] mobile phone

## 2019-06-19 DIAGNOSIS — L8990 Pressure ulcer of unspecified site, unspecified stage: Secondary | ICD-10-CM | POA: Insufficient documentation

## 2019-06-19 LAB — CBG MONITORING, ED: Glucose-Capillary: 91 mg/dL (ref 70–99)

## 2019-06-19 NOTE — ED Provider Notes (Signed)
Care of the patient was assumed to the change of shift pending evaluation by social worker for potential skilled nursing facility placement.  Per the social worker the patient has a person in [place] who is helping him to get a Medicaid application although this has not been submitted yet.  None of the local skilled nursing facilities will accept the patient without a pending Medicaid application.  He does not have any other acute medical issues that would require admission to the hospital.  He will not build to stay in the emergency department for the duration of time it will take to complete his Medicaid application and so he will be discharged back home with the care of his wife to continue with that process.   Pollyann Savoy, MD 06/19/19 [phone]

## 2019-06-19 NOTE — Clinical Social Work Note (Signed)
CSW spoke with patient and explained the SNF bed offers were declined even though patient stated he would private pay and that his Medicaid application would need to be pending and assigned to a case worker before SNFs will accept the referral. Patient requested staying at the hospital another night as his wife is not home. CSW explained that leaving family members in the ED is abandonment and CSW would need to make a report with APS. Patient stated he would try to contact his wife. CSW called [facility] and explained that once patient is medically cleared he will be discharged from the hospital due to SNFs declining the referral without a guaranteed payor source. CSW notified by patient his wife was now home. CSW called wife, Jonathan Wise, to confirm she would be home to let the patient in. Wife stated she would be home. CSW spoke with patient and informed him his wife was home and he would return home once discharged by medical. CSW notified RN, Herbert Seta, patient would not transfer to a SNF and can return home. RN informed CSW discharge paperwork was started and EMS had been called for transportation. CSW signing off.  [name], LCSW Transitions of Care Clinical Social Worker [facility] Ph: [phone]

## 2019-06-19 NOTE — Care Management (Signed)
Transition of Care [facility]) - Emergency Department Mini Assessment   Patient Details  Name: Jonathan Wise MRN: [id number] Date of Birth: 09/24/1945  Transition of Care [facility]) CM/SW Contact:    Ewing Schlein, LCSW Phone Number: 06/19/2019, 11:51 AM  Clinical Narrative: CSW completed ED mini assessment. CSW to follow up with SNF bed offers.  ED Mini Assessment: What brought you to the Emergency Department? : Bed sores Barriers to Discharge: SNF Pending bed offer Barrier interventions: Called Marta Lamas with [facility] to f/u w/placement; called Tiburcio Pea [phone]) to f/u with Medicaid application for long-term care Means of departure: Ambulance(Will need to be transferred by EMS to SNF or home)  Patient Contact and Communications Key Contact 1: Marta Lamas [facility]) Key Contact 2: Hope Niedrich   Patient states their goals for this hospitalization and ongoing recovery are:: Discharge to SNF and wait for Medicaid application approval so he can transfer to a nursing facility for long-term care CMS Medicare.gov Compare Post Acute Care list provided to:: Patient Choice offered to / list presented to : Patient  Admission diagnosis:  Bed Sores Patient Active Problem List   Diagnosis Date Noted  . 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 Bedbound 05/14/2019  . Former heavy tobacco smoker 05/14/2019  . Atrial fibrillation with rapid ventricular response (HCC) 05/13/2019  . Chronic osteoarthritis 01/01/2017  . Lower extremity weakness 01/01/2017  . Anemia of chronic disease 12/25/2015  . Candidal intertrigo 12/22/2015  . 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 Bradycardia 03/15/2014  . Lactic acidosis 03/15/2014  . Atrial fibrillation (HCC) 06/23/2012  . PVC's (premature ventricular contractions) 06/23/2012  . Cellulitis of leg, left 06/22/2012  . Sepsis due to pneumonia (HCC) 06/22/2012  . Morbid obesity (HCC) 06/22/2012  . Osteoarthritis of right  knee 06/22/2012  . Current use of long term anticoagulation 06/22/2012  . Pulmonary artery hypertension (HCC) 10/18/2011  . Bilateral lower extremity edema 10/14/2011  . Hypotension 10/14/2011  . Venous stasis ulcers (HCC) 10/14/2011  . Type 2 diabetes mellitus without complication (HCC) 10/14/2011   PCP:  [name], MD [facility]acy[phone] [place] [place] - [address] [place] [address] Phone: [phone] Fax: [phone]

## 2019-06-19 NOTE — Clinical Social Work Note (Addendum)
CSW called [facility] and spoke with Marta Lamas about patient's possible bed offer. Eunice Blase stated the patient has not completed and submitted his Medicaid application yet and [facility] may not be able to take the patient until that is submitted. CSW explained that if the SNFs with bed offers do not accept him for that reason the patient cannot remain in the ED awaiting placement once he is medically cleared. Debbie informed CSW she would follow up with the administrators and call back CSW.  Addendum:  10:21am: Spoke with Velna Hatchet at [facility] to see if the patient would be accepted as private pay after Medicare stops paying for days. Velna Hatchet stated she would have to follow up with administration.  10:25am-Called [facility] and spoke with [employer]. CSW was informed the patient would not be accepted as his Medicaid application is not pending and he does not have enough days through Medicare to cover the duration of his stay.  10:28am: [facility] and spoke with [place] who informed CSW patient would be responsible for $178/day, but they may not accept him without a pending Medicaid application as there is no guaranteed payor.  10:31am: CSW received call from Debbie with [facility] and was told [facility] cannot accept the patient without a pending Medicaid application.  [name], LCSW Transitions of Care Clinical Social Worker [facility] Ph: [phone]

## 2019-06-22 LAB — CULTURE, BLOOD (ROUTINE X 2)
Culture: NO GROWTH
Culture: NO GROWTH
Special Requests: ADEQUATE
Special Requests: ADEQUATE

## 2019-10-19 ENCOUNTER — Inpatient Hospital Stay (HOSPITAL_COMMUNITY): Payer: Medicare Other

## 2019-10-19 ENCOUNTER — Inpatient Hospital Stay (HOSPITAL_COMMUNITY)
Admission: EM | Admit: 2019-10-19 | Discharge: 2019-11-04 | DRG: 871 | Disposition: A | Discharge status: Home Health | Payer: Medicare Other | Source: Skilled Nursing Facility | Attending: Internal Medicine | Admitting: Internal Medicine

## 2019-10-19 ENCOUNTER — Encounter (HOSPITAL_COMMUNITY): Payer: Self-pay

## 2019-10-19 ENCOUNTER — Emergency Department (HOSPITAL_COMMUNITY): Payer: Medicare Other

## 2019-10-19 DIAGNOSIS — G9341 Metabolic encephalopathy: Secondary | ICD-10-CM | POA: Diagnosis present

## 2019-10-19 DIAGNOSIS — Z7984 Long term (current) use of oral hypoglycemic drugs: Secondary | ICD-10-CM

## 2019-10-19 DIAGNOSIS — R4182 Altered mental status, unspecified: Secondary | ICD-10-CM | POA: Diagnosis present

## 2019-10-19 DIAGNOSIS — Z9981 Dependence on supplemental oxygen: Secondary | ICD-10-CM

## 2019-10-19 DIAGNOSIS — J181 Lobar pneumonia, unspecified organism: Secondary | ICD-10-CM | POA: Diagnosis not present

## 2019-10-19 DIAGNOSIS — Z86718 Personal history of other venous thrombosis and embolism: Secondary | ICD-10-CM | POA: Diagnosis not present

## 2019-10-19 DIAGNOSIS — Z66 Do not resuscitate: Secondary | ICD-10-CM | POA: Diagnosis present

## 2019-10-19 DIAGNOSIS — N179 Acute kidney failure, unspecified: Secondary | ICD-10-CM | POA: Diagnosis present

## 2019-10-19 DIAGNOSIS — E785 Hyperlipidemia, unspecified: Secondary | ICD-10-CM | POA: Diagnosis present

## 2019-10-19 DIAGNOSIS — G4730 Sleep apnea, unspecified: Secondary | ICD-10-CM | POA: Diagnosis present

## 2019-10-19 DIAGNOSIS — R6520 Severe sepsis without septic shock: Secondary | ICD-10-CM | POA: Diagnosis not present

## 2019-10-19 DIAGNOSIS — E119 Type 2 diabetes mellitus without complications: Secondary | ICD-10-CM | POA: Diagnosis present

## 2019-10-19 DIAGNOSIS — Z87891 Personal history of nicotine dependence: Secondary | ICD-10-CM

## 2019-10-19 DIAGNOSIS — I83028 Varicose veins of left lower extremity with ulcer other part of lower leg: Secondary | ICD-10-CM | POA: Diagnosis present

## 2019-10-19 DIAGNOSIS — I82409 Acute embolism and thrombosis of unspecified deep veins of unspecified lower extremity: Secondary | ICD-10-CM

## 2019-10-19 DIAGNOSIS — Z833 Family history of diabetes mellitus: Secondary | ICD-10-CM

## 2019-10-19 DIAGNOSIS — Z978 Presence of other specified devices: Secondary | ICD-10-CM

## 2019-10-19 DIAGNOSIS — E874 Mixed disorder of acid-base balance: Secondary | ICD-10-CM | POA: Diagnosis present

## 2019-10-19 DIAGNOSIS — N400 Enlarged prostate without lower urinary tract symptoms: Secondary | ICD-10-CM | POA: Diagnosis not present

## 2019-10-19 DIAGNOSIS — R6521 Severe sepsis with septic shock: Secondary | ICD-10-CM | POA: Diagnosis present

## 2019-10-19 DIAGNOSIS — Z7901 Long term (current) use of anticoagulants: Secondary | ICD-10-CM

## 2019-10-19 DIAGNOSIS — E876 Hypokalemia: Secondary | ICD-10-CM | POA: Diagnosis not present

## 2019-10-19 DIAGNOSIS — I482 Chronic atrial fibrillation, unspecified: Secondary | ICD-10-CM | POA: Diagnosis present

## 2019-10-19 DIAGNOSIS — J9601 Acute respiratory failure with hypoxia: Secondary | ICD-10-CM | POA: Diagnosis not present

## 2019-10-19 DIAGNOSIS — I5033 Acute on chronic diastolic (congestive) heart failure: Secondary | ICD-10-CM | POA: Diagnosis present

## 2019-10-19 DIAGNOSIS — I480 Paroxysmal atrial fibrillation: Secondary | ICD-10-CM | POA: Diagnosis present

## 2019-10-19 DIAGNOSIS — Z7401 Bed confinement status: Secondary | ICD-10-CM | POA: Diagnosis not present

## 2019-10-19 DIAGNOSIS — Z20822 Contact with and (suspected) exposure to covid-19: Secondary | ICD-10-CM | POA: Diagnosis present

## 2019-10-19 DIAGNOSIS — Z6838 Body mass index (BMI) 38.0-38.9, adult: Secondary | ICD-10-CM

## 2019-10-19 DIAGNOSIS — I83009 Varicose veins of unspecified lower extremity with ulcer of unspecified site: Secondary | ICD-10-CM | POA: Diagnosis present

## 2019-10-19 DIAGNOSIS — N390 Urinary tract infection, site not specified: Secondary | ICD-10-CM | POA: Diagnosis present

## 2019-10-19 DIAGNOSIS — I4891 Unspecified atrial fibrillation: Secondary | ICD-10-CM | POA: Diagnosis present

## 2019-10-19 DIAGNOSIS — J9621 Acute and chronic respiratory failure with hypoxia: Secondary | ICD-10-CM | POA: Diagnosis present

## 2019-10-19 DIAGNOSIS — I110 Hypertensive heart disease with heart failure: Secondary | ICD-10-CM | POA: Diagnosis present

## 2019-10-19 DIAGNOSIS — R627 Adult failure to thrive: Secondary | ICD-10-CM | POA: Diagnosis present

## 2019-10-19 DIAGNOSIS — Z993 Dependence on wheelchair: Secondary | ICD-10-CM

## 2019-10-19 DIAGNOSIS — A419 Sepsis, unspecified organism: Secondary | ICD-10-CM | POA: Diagnosis present

## 2019-10-19 DIAGNOSIS — E875 Hyperkalemia: Secondary | ICD-10-CM | POA: Diagnosis present

## 2019-10-19 DIAGNOSIS — Z0189 Encounter for other specified special examinations: Secondary | ICD-10-CM

## 2019-10-19 DIAGNOSIS — Z825 Family history of asthma and other chronic lower respiratory diseases: Secondary | ICD-10-CM

## 2019-10-19 DIAGNOSIS — Z452 Encounter for adjustment and management of vascular access device: Secondary | ICD-10-CM

## 2019-10-19 DIAGNOSIS — L97416 Non-pressure chronic ulcer of right heel and midfoot with bone involvement without evidence of necrosis: Secondary | ICD-10-CM | POA: Diagnosis not present

## 2019-10-19 DIAGNOSIS — Z4659 Encounter for fitting and adjustment of other gastrointestinal appliance and device: Secondary | ICD-10-CM

## 2019-10-19 DIAGNOSIS — I83014 Varicose veins of right lower extremity with ulcer of heel and midfoot: Secondary | ICD-10-CM | POA: Diagnosis not present

## 2019-10-19 DIAGNOSIS — T380X5A Adverse effect of glucocorticoids and synthetic analogues, initial encounter: Secondary | ICD-10-CM | POA: Diagnosis not present

## 2019-10-19 DIAGNOSIS — I83018 Varicose veins of right lower extremity with ulcer other part of lower leg: Secondary | ICD-10-CM | POA: Diagnosis present

## 2019-10-19 DIAGNOSIS — Z794 Long term (current) use of insulin: Secondary | ICD-10-CM | POA: Diagnosis not present

## 2019-10-19 DIAGNOSIS — I452 Bifascicular block: Secondary | ICD-10-CM | POA: Diagnosis present

## 2019-10-19 LAB — CBC WITH DIFFERENTIAL/PLATELET
Abs Immature Granulocytes: 0.52 10*3/uL — ABNORMAL HIGH (ref 0.00–0.07)
Basophils Absolute: 0.2 10*3/uL — ABNORMAL HIGH (ref 0.0–0.1)
Basophils Relative: 1 %
Eosinophils Absolute: 0 10*3/uL (ref 0.0–0.5)
Eosinophils Relative: 0 %
HCT: 45.3 % (ref 39.0–52.0)
Hemoglobin: 11.8 g/dL — ABNORMAL LOW (ref 13.0–17.0)
Immature Granulocytes: 2 %
Lymphocytes Relative: 13 %
Lymphs Abs: 2.9 10*3/uL (ref 0.7–4.0)
MCH: 24.6 pg — ABNORMAL LOW (ref 26.0–34.0)
MCHC: 26 g/dL — ABNORMAL LOW (ref 30.0–36.0)
MCV: 94.6 fL (ref 80.0–100.0)
Monocytes Absolute: 1.4 10*3/uL — ABNORMAL HIGH (ref 0.1–1.0)
Monocytes Relative: 7 %
Neutro Abs: 16.5 10*3/uL — ABNORMAL HIGH (ref 1.7–7.7)
Neutrophils Relative %: 77 %
Platelets: 520 10*3/uL — ABNORMAL HIGH (ref 150–400)
RBC: 4.79 MIL/uL (ref 4.22–5.81)
RDW: 16.1 % — ABNORMAL HIGH (ref 11.5–15.5)
WBC: 21.5 10*3/uL — ABNORMAL HIGH (ref 4.0–10.5)
nRBC: 0.4 % — ABNORMAL HIGH (ref 0.0–0.2)

## 2019-10-19 LAB — LACTIC ACID, PLASMA
Lactic Acid, Venous: 5.1 mmol/L (ref 0.5–1.9)
Lactic Acid, Venous: 3.8 mmol/L (ref 0.5–1.9)

## 2019-10-19 LAB — COMPREHENSIVE METABOLIC PANEL
ALT: 27 U/L (ref 0–44)
AST: 54 U/L — ABNORMAL HIGH (ref 15–41)
Albumin: 3 g/dL — ABNORMAL LOW (ref 3.5–5.0)
Alkaline Phosphatase: 79 U/L (ref 38–126)
Anion gap: 15 (ref 5–15)
BUN: 46 mg/dL — ABNORMAL HIGH (ref 8–23)
CO2: 23 mmol/L (ref 22–32)
Calcium: 8.4 mg/dL — ABNORMAL LOW (ref 8.9–10.3)
Chloride: 97 mmol/L — ABNORMAL LOW (ref 98–111)
Creatinine, Ser: 3.1 mg/dL — ABNORMAL HIGH (ref 0.61–1.24)
GFR calc Af Amer: 22 mL/min — ABNORMAL LOW (ref >60)
GFR calc non Af Amer: 19 mL/min — ABNORMAL LOW (ref >60)
Glucose, Bld: 96 mg/dL (ref 70–99)
Potassium: 6.8 mmol/L (ref 3.5–5.1)
Sodium: 135 mmol/L (ref 135–145)
Total Bilirubin: 1.1 mg/dL (ref 0.3–1.2)
Total Protein: 6.7 g/dL (ref 6.5–8.1)

## 2019-10-19 LAB — BLOOD GAS, ARTERIAL
Acid-base deficit: 7.2 mmol/L — ABNORMAL HIGH (ref 0.0–2.0)
Bicarbonate: 18.4 mmol/L — ABNORMAL LOW (ref 20.0–28.0)
FIO2: 100
O2 Saturation: 99.6 %
Patient temperature: 35.3
pCO2 arterial: 37.7 mmHg (ref 32.0–48.0)
pH, Arterial: 7.298 — ABNORMAL LOW (ref 7.350–7.450)
pO2, Arterial: 221 mmHg — ABNORMAL HIGH (ref 83.0–108.0)

## 2019-10-19 LAB — BASIC METABOLIC PANEL
Anion gap: 14 (ref 5–15)
BUN: 45 mg/dL — ABNORMAL HIGH (ref 8–23)
CO2: 17 mmol/L — ABNORMAL LOW (ref 22–32)
Calcium: 7.5 mg/dL — ABNORMAL LOW (ref 8.9–10.3)
Chloride: 105 mmol/L (ref 98–111)
Creatinine, Ser: 2.86 mg/dL — ABNORMAL HIGH (ref 0.61–1.24)
GFR calc Af Amer: 24 mL/min — ABNORMAL LOW (ref >60)
GFR calc non Af Amer: 21 mL/min — ABNORMAL LOW (ref >60)
Glucose, Bld: 115 mg/dL — ABNORMAL HIGH (ref 70–99)
Potassium: 6.1 mmol/L — ABNORMAL HIGH (ref 3.5–5.1)
Sodium: 136 mmol/L (ref 135–145)

## 2019-10-19 LAB — CBG MONITORING, ED
Glucose-Capillary: 116 mg/dL — ABNORMAL HIGH (ref 70–99)
Glucose-Capillary: 88 mg/dL (ref 70–99)

## 2019-10-19 LAB — SARS CORONAVIRUS 2 BY RT PCR (HOSPITAL ORDER, PERFORMED IN ~~LOC~~ HOSPITAL LAB): SARS Coronavirus 2: NEGATIVE

## 2019-10-19 LAB — APTT: aPTT: 31 seconds (ref 24–36)

## 2019-10-19 LAB — PROTIME-INR
INR: 1.6 — ABNORMAL HIGH (ref 0.8–1.2)
Prothrombin Time: 18.2 seconds — ABNORMAL HIGH (ref 11.4–15.2)

## 2019-10-19 MED ORDER — SODIUM CHLORIDE 0.9 % IV SOLN
2.0000 g | Freq: Once | INTRAVENOUS | Status: AC
  Administered 2019-10-19: 2 g via INTRAVENOUS
  Filled 2019-10-19: qty 2

## 2019-10-19 MED ORDER — MIDAZOLAM HCL 5 MG/5ML IJ SOLN: 5.0000 mg | Freq: Once | INTRAMUSCULAR | Status: AC

## 2019-10-19 MED ORDER — DEXTROSE 50 % IV SOLN
50.0000 mL | Freq: Once | INTRAVENOUS | Status: DC
  Filled 2019-10-19: qty 50

## 2019-10-19 MED ORDER — MIDAZOLAM HCL 2 MG/2ML IJ SOLN
INTRAMUSCULAR | Status: AC
  Filled 2019-10-19: qty 2

## 2019-10-19 MED ORDER — FENTANYL CITRATE (PF) 100 MCG/2ML IJ SOLN: 25.0000 ug | INTRAMUSCULAR | Status: DC | PRN

## 2019-10-19 MED ORDER — VANCOMYCIN HCL 2000 MG/400ML IV SOLN
2000.0000 mg | Freq: Once | INTRAVENOUS | Status: AC
  Administered 2019-10-19: 2000 mg via INTRAVENOUS
  Filled 2019-10-19: qty 400

## 2019-10-19 MED ORDER — DOCUSATE SODIUM 50 MG/5ML PO LIQD
100.0000 mg | Freq: Two times a day (BID) | ORAL | Status: DC
  Administered 2019-10-20 – 2019-10-21 (×3): 100 mg via ORAL
  Filled 2019-10-19 (×5): qty 10

## 2019-10-19 MED ORDER — APIXABAN 5 MG PO TABS: 5.0000 mg | ORAL_TABLET | Freq: Two times a day (BID) | ORAL | Status: DC

## 2019-10-19 MED ORDER — ONDANSETRON HCL 4 MG PO TABS
4.0000 mg | ORAL_TABLET | Freq: Four times a day (QID) | ORAL | Status: DC | PRN
  Filled 2019-10-19: qty 1

## 2019-10-19 MED ORDER — MIDAZOLAM HCL 2 MG/2ML IJ SOLN
1.0000 mg | INTRAMUSCULAR | Status: DC | PRN
  Administered 2019-10-19: 1 mg via INTRAVENOUS

## 2019-10-19 MED ORDER — SODIUM CHLORIDE 0.9 % IV BOLUS (SEPSIS)
1000.0000 mL | Freq: Once | INTRAVENOUS | Status: AC
  Administered 2019-10-19: 1000 mL via INTRAVENOUS

## 2019-10-19 MED ORDER — SODIUM CHLORIDE 0.9 % IV SOLN: INTRAVENOUS | Status: DC

## 2019-10-19 MED ORDER — SODIUM CHLORIDE 0.9 % IV BOLUS (SEPSIS)
1000.0000 mL | Freq: Once | INTRAVENOUS | Status: AC
  Administered 2019-10-19 (×2): 1000 mL via INTRAVENOUS

## 2019-10-19 MED ORDER — INSULIN ASPART 100 UNIT/ML IV SOLN
5.0000 [IU] | Freq: Once | INTRAVENOUS | Status: AC
  Administered 2019-10-19: 5 [IU] via INTRAVENOUS

## 2019-10-19 MED ORDER — DEXTROSE 50 % IV SOLN
1.0000 | Freq: Once | INTRAVENOUS | Status: AC
  Administered 2019-10-19: 50 mL via INTRAVENOUS
  Filled 2019-10-19: qty 50

## 2019-10-19 MED ORDER — FAMOTIDINE IN NACL 20-0.9 MG/50ML-% IV SOLN
20.0000 mg | Freq: Two times a day (BID) | INTRAVENOUS | Status: DC
  Administered 2019-10-19 – 2019-10-20 (×2): 20 mg via INTRAVENOUS
  Filled 2019-10-19 (×2): qty 50

## 2019-10-19 MED ORDER — FENTANYL CITRATE (PF) 100 MCG/2ML IJ SOLN
25.0000 ug | INTRAMUSCULAR | Status: DC | PRN
  Administered 2019-10-20: 100 ug via INTRAVENOUS
  Administered 2019-10-20: 50 ug via INTRAVENOUS
  Administered 2019-10-20: 100 ug via INTRAVENOUS
  Administered 2019-10-20 – 2019-10-21 (×3): 50 ug via INTRAVENOUS
  Filled 2019-10-19: qty 2

## 2019-10-19 MED ORDER — ACETAMINOPHEN 650 MG RE SUPP: 650.0000 mg | Freq: Four times a day (QID) | RECTAL | Status: DC | PRN

## 2019-10-19 MED ORDER — INSULIN ASPART 100 UNIT/ML ~~LOC~~ SOLN
0.0000 [IU] | Freq: Four times a day (QID) | SUBCUTANEOUS | Status: DC
  Administered 2019-10-20: 1 [IU] via SUBCUTANEOUS

## 2019-10-19 MED ORDER — VANCOMYCIN HCL IN DEXTROSE 1-5 GM/200ML-% IV SOLN: 1000.0000 mg | Freq: Once | INTRAVENOUS | Status: DC

## 2019-10-19 MED ORDER — METRONIDAZOLE IN NACL 5-0.79 MG/ML-% IV SOLN
500.0000 mg | Freq: Once | INTRAVENOUS | Status: AC
  Administered 2019-10-19: 500 mg via INTRAVENOUS
  Filled 2019-10-19: qty 100

## 2019-10-19 MED ORDER — MIDAZOLAM HCL 2 MG/2ML IJ SOLN
1.0000 mg | INTRAMUSCULAR | Status: DC | PRN
  Administered 2019-10-19 – 2019-10-20 (×2): 1 mg via INTRAVENOUS
  Filled 2019-10-19 (×2): qty 2

## 2019-10-19 MED ORDER — INSULIN ASPART 100 UNIT/ML ~~LOC~~ SOLN: 10.0000 [IU] | Freq: Once | SUBCUTANEOUS | Status: DC

## 2019-10-19 MED ORDER — VANCOMYCIN HCL 1250 MG/250ML IV SOLN
1250.0000 mg | INTRAVENOUS | Status: DC
  Filled 2019-10-19: qty 250

## 2019-10-19 MED ORDER — ACETAMINOPHEN 325 MG PO TABS: 650.0000 mg | ORAL_TABLET | Freq: Four times a day (QID) | ORAL | Status: DC | PRN

## 2019-10-19 MED ORDER — MIDAZOLAM HCL 5 MG/5ML IJ SOLN
INTRAMUSCULAR | Status: AC
  Administered 2019-10-19: 5 mg via INTRAVENOUS
  Filled 2019-10-19: qty 5

## 2019-10-19 MED ORDER — NOREPINEPHRINE 4 MG/250ML-% IV SOLN
0.0000 ug/min | INTRAVENOUS | Status: DC
  Administered 2019-10-20: 20 ug/min via INTRAVENOUS
  Administered 2019-10-20: 10 ug/min via INTRAVENOUS
  Administered 2019-10-20 (×2): 8 ug/min via INTRAVENOUS
  Administered 2019-10-21: 6 ug/min via INTRAVENOUS
  Administered 2019-10-21: 7 ug/min via INTRAVENOUS
  Filled 2019-10-19 (×6): qty 250

## 2019-10-19 MED ORDER — CALCIUM GLUCONATE-NACL 1-0.675 GM/50ML-% IV SOLN
1.0000 g | Freq: Once | INTRAVENOUS | Status: AC
  Administered 2019-10-19: 1000 mg via INTRAVENOUS
  Filled 2019-10-19: qty 50

## 2019-10-19 MED ORDER — ETOMIDATE 2 MG/ML IV SOLN: INTRAVENOUS | Status: AC | PRN

## 2019-10-19 MED ORDER — SODIUM CHLORIDE 0.9 % IV BOLUS
1000.0000 mL | Freq: Once | INTRAVENOUS | Status: AC
  Administered 2019-10-19: 1000 mL via INTRAVENOUS

## 2019-10-19 MED ORDER — SODIUM CHLORIDE 0.9 % IV SOLN
2.0000 g | Freq: Two times a day (BID) | INTRAVENOUS | Status: DC
  Administered 2019-10-20: 2 g via INTRAVENOUS
  Filled 2019-10-19: qty 2

## 2019-10-19 MED ORDER — SODIUM ZIRCONIUM CYCLOSILICATE 5 G PO PACK: 10.0000 g | PACK | Freq: Once | ORAL | Status: DC

## 2019-10-19 MED ORDER — METRONIDAZOLE IN NACL 5-0.79 MG/ML-% IV SOLN
500.0000 mg | Freq: Three times a day (TID) | INTRAVENOUS | Status: DC
  Administered 2019-10-20 (×3): 500 mg via INTRAVENOUS
  Filled 2019-10-19 (×4): qty 100

## 2019-10-19 MED ORDER — ONDANSETRON HCL 4 MG/2ML IJ SOLN
4.0000 mg | Freq: Four times a day (QID) | INTRAMUSCULAR | Status: DC | PRN
  Administered 2019-10-24 – 2019-10-26 (×2): 4 mg via INTRAVENOUS
  Filled 2019-10-19 (×2): qty 2

## 2019-10-19 MED ORDER — SODIUM CHLORIDE 0.9 % IV BOLUS: 1000.0000 mL | Freq: Once | INTRAVENOUS | Status: DC

## 2019-10-19 NOTE — ED Notes (Signed)
2 sets of blood cultures obtained.

## 2019-10-19 NOTE — ED Notes (Signed)
Date and time results received: 10/19/19  1650  Test: LA Critical Value: 5.1  Name of Provider Notified: EDP, Zackowski   Orders Received? Or Actions Taken?: N/A

## 2019-10-19 NOTE — Code Documentation (Signed)
+   color change.

## 2019-10-19 NOTE — ED Notes (Signed)
Pt cleaned. Pt with large loose stool. BP 62/43. EDP notifed

## 2019-10-19 NOTE — ED Triage Notes (Signed)
Pt brought in by EMS due to decline in mental status since Monday. Pt has had decrease po intake and urinary output . Pt has been lethargic throughout days. Pt will answer question slowly. Pt noted to have pressure sores on legs that are wrapped

## 2019-10-19 NOTE — Progress Notes (Signed)
Oxygen decreased to 60, Patient being transported to [facility]. Care Crisof[facility] Oxford has arrived.

## 2019-10-19 NOTE — ED Notes (Signed)
Patient has been transported to X-ray after intubation. Blood gas obtained around 2200.

## 2019-10-19 NOTE — ED Notes (Signed)
Wife at bedside.

## 2019-10-19 NOTE — ED Notes (Signed)
Soft restraints placed on bilateral wrists due to patient attempting to pull at ET tube.

## 2019-10-19 NOTE — ED Provider Notes (Addendum)
[facility] Provider Note   CSN: [id number] Arrival date & time: 10/19/19  1529     History Chief Complaint  Patient presents with  . Altered Mental Status    Jonathan Wise is a 74 y.o. male.  Patient brought in baseline till Friday.  When he slept all day.  Does not want to eat or drink much.  Not as alert as usual normally eats well and is very talkative but has been bedbound since his admission in February here.  Past medical history significant for diabetes atrial fibrillation chronic lower extremity ulcer secondary to chronic edema and venous stasis.  Something significantly changed on Friday.  Patient is a full code.  EMS stated that he was hypoxic on his normal amount of oxygen he was hypotensive.  Said they were getting pressure around 76.  Patient with admission in February was a full code at that time.  When patient's wife arrived had discussion with her about his resuscitation status.  Seems that there was no discussion.  And she was not sure what to do.  She did not want to limit resuscitation.        Past Medical History:  Diagnosis Date  . Atrial fibrillation (HCC)    On Pradaxa  . Bilateral lower extremity edema   . Cellulitis   . DVT (deep venous thrombosis) (HCC) 1994   LLE. Completed tx with coumadin  . Hyperlipidemia   . Hypertension   . Kidney stones   . Morbid obesity (HCC)   . Pressure ulcer   . Pulmonary artery hypertension (HCC) 10/18/2011  . Sleep apnea   . Type 2 diabetes mellitus (HCC)   . Urinary retention 10/15/2011  . Venous stasis ulcers [facility](HCC)     Patient Active Problem List   Diagnosis Date Noted  . Severe sepsis (HCC) 10/19/2019  . Pressure injury of skin 06/19/2019  . 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 Bedbound 05/14/2019  . Former heavy tobacco smoker 05/14/2019  . Atrial fibrillation with rapid ventricular response (HCC) 05/13/2019  . Chronic osteoarthritis 01/01/2017  . Lower extremity  weakness 01/01/2017  . Anemia of chronic disease 12/25/2015  . Candidal intertrigo 12/22/2015  . 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 Bradycardia 03/15/2014  . Lactic acidosis 03/15/2014  . Atrial fibrillation (HCC) 06/23/2012  . PVC's (premature ventricular contractions) 06/23/2012  . Cellulitis of leg, left 06/22/2012  . Sepsis due to pneumonia (HCC) 06/22/2012  . Morbid obesity (HCC) 06/22/2012  . Osteoarthritis of right knee 06/22/2012  . Current use of long term anticoagulation 06/22/2012  . Pulmonary artery hypertension (HCC) 10/18/2011  . Bilateral lower extremity edema 10/14/2011  . Hypotension 10/14/2011  . Venous stasis ulcers (HCC) 10/14/2011  . Type 2 diabetes mellitus without complication (HCC) 10/14/2011    Past Surgical History:  Procedure Laterality Date  . CYSTOSCOPY  10/18/2011   Procedure: CYSTOSCOPY FLEXIBLE;  Surgeon: Ky BarbanMohammad I Javaid, MD;  Location: [facility];  Service: Urology;  Laterality: N/A;  . HERNIA REPAIR     Umbilical       Family History  Problem Relation Age of Onset  . Diabetes Mother   . Emphysema Father     Social History   Tobacco Use  . Smoking status: Former Games developermoker  . Smokeless tobacco: Never Used  Substance Use Topics  . Alcohol use: No    Alcohol/week: 0.0 standard drinks  . Drug use: No    Home Medications Prior to Admission medications  Medication Sig Start Date End Date Taking? Authorizing Provider  apixaban (ELIQUIS) 5 MG TABS tablet Take 1 tablet (5 mg total) by mouth 2 (two) times daily. 07/22/17  Yes Hall, Carole N, DO  hydrocerin (EUCERIN) CREA Apply 1 application topically 2 (two) times daily. 05/19/19  Yes Erick Blinks, MD  lisinopril (ZESTRIL) 10 MG tablet Take 10 mg by mouth daily. 10/10/19  Yes [provider]  metFORMIN (GLUCOPHAGE) 500 MG tablet Take 500 mg by mouth 2 (two) times daily with a meal.   Yes [provider]  metoprolol tartrate (LOPRESSOR) 25 MG tablet Take 25 mg by mouth  2 (two) times daily. 07/02/19  Yes [provider]  pravastatin (PRAVACHOL) 40 MG tablet Take 40 mg by mouth daily.   Yes [provider]  TRESIBA FLEXTOUCH 100 UNIT/ML FlexTouch Pen Inject 26 Units into the skin daily. 10/03/19  Yes [provider]  verapamil (CALAN) 120 MG tablet Take 1 tablet by mouth 3 (three) times daily. 07/02/19  Yes [provider]    Allergies    Patient has no known allergies.  Review of Systems   Review of Systems  Unable to perform ROS: Mental status change  Constitutional: Positive for chills, fatigue and fever.  HENT: Negative for congestion, rhinorrhea and sore throat.   Respiratory: Positive for shortness of breath. Negative for cough.   Cardiovascular: Negative for chest pain and leg swelling.  Gastrointestinal: Negative for abdominal pain, diarrhea, nausea and vomiting.  Genitourinary: Negative for dysuria.  Skin: Negative for rash.  Neurological: Positive for weakness. Negative for headaches.  Hematological: Bruises/bleeds easily.  Psychiatric/Behavioral: Negative for confusion.    Physical Exam Updated Vital Signs BP 118/76   Pulse 60   Temp (!) 95.4 F (35.2 C)   Resp (!) 27   Wt 119.7 kg   SpO2 98%   BMI 34.83 kg/m   Physical Exam Vitals and nursing note reviewed.  Constitutional:      Appearance: He is well-developed. He is obese. He is toxic-appearing.     Comments: Patient away will answer some questions but is fairly somnolent.  HENT:     Head: Normocephalic and atraumatic.     Mouth/Throat:     Mouth: Mucous membranes are dry.  Eyes:     Extraocular Movements: Extraocular movements intact.     Conjunctiva/sclera: Conjunctivae normal.     Pupils: Pupils are equal, round, and reactive to light.  Cardiovascular:     Rate and Rhythm: Normal rate and regular rhythm.     Heart sounds: No murmur heard.   Pulmonary:     Effort: Respiratory distress present.     Breath sounds: Normal breath  sounds.  Abdominal:     Palpations: Abdomen is soft.     Tenderness: There is no abdominal tenderness.  Musculoskeletal:        General: Swelling present. Normal range of motion.     Cervical back: Normal range of motion and neck supple.     Comments: Chronic skin changes secondary to chronic edema.  To both lower extremities.  Skin:    General: Skin is warm and dry.     Capillary Refill: Capillary refill takes more than 3 seconds.  Neurological:     Mental Status: He is alert.     Comments: Patient awake and will answer some questions able to gave his date of birth.  But somewhat somnolent.  Moving all 4 extremities.  No gross focal deficit.  ED Results / Procedures / Treatments   Labs (all labs ordered are listed, but only abnormal results are displayed) Labs Reviewed  CBC WITH DIFFERENTIAL/PLATELET - Abnormal; Notable for the following components:      Result Value   WBC 21.5 (*)    Hemoglobin 11.8 (*)    MCH 24.6 (*)    MCHC 26.0 (*)    RDW 16.1 (*)    Platelets 520 (*)    nRBC 0.4 (*)    Neutro Abs 16.5 (*)    Monocytes Absolute 1.4 (*)    Basophils Absolute 0.2 (*)    Abs Immature Granulocytes 0.52 (*)    All other components within normal limits  COMPREHENSIVE METABOLIC PANEL - Abnormal; Notable for the following components:   Potassium 6.8 (*)    Chloride 97 (*)    BUN 46 (*)    Creatinine, Ser 3.10 (*)    Calcium 8.4 (*)    Albumin 3.0 (*)    AST 54 (*)    GFR calc non Af Amer 19 (*)    GFR calc Af Amer 22 (*)    All other components within normal limits  LACTIC ACID, PLASMA - Abnormal; Notable for the following components:   Lactic Acid, Venous 5.1 (*)    All other components within normal limits  LACTIC ACID, PLASMA - Abnormal; Notable for the following components:   Lactic Acid, Venous 3.8 (*)    All other components within normal limits  PROTIME-INR - Abnormal; Notable for the following components:   Prothrombin Time 18.2 (*)    INR 1.6 (*)     All other components within normal limits  BASIC METABOLIC PANEL - Abnormal; Notable for the following components:   Potassium 6.1 (*)    CO2 17 (*)    Glucose, Bld 115 (*)    BUN 45 (*)    Creatinine, Ser 2.86 (*)    Calcium 7.5 (*)    GFR calc non Af Amer 21 (*)    GFR calc Af Amer 24 (*)    All other components within normal limits  SARS CORONAVIRUS 2 BY RT PCR (HOSPITAL ORDER, PERFORMED IN  HOSPITAL LAB)  CULTURE, BLOOD (ROUTINE X 2)  CULTURE, BLOOD (ROUTINE X 2)  URINE CULTURE  APTT  URINALYSIS, ROUTINE W REFLEX MICROSCOPIC  BLOOD GAS, ARTERIAL  CBG MONITORING, ED  I-STAT CHEM 8, ED    EKG EKG Interpretation  Date/Time:  Sunday October 19 2019 15:35:32 EDT Ventricular Rate:  39 PR Interval:    QRS Duration: 147 QT Interval:  463 QTC Calculation: 373 R Axis:   -178 Text Interpretation: Atrial flutter RBBB and LPFB Confirmed by Vanetta Mulders [phone]) on 10/19/2019 4:45:35 PM   Radiology DG Chest Port 1 View  Result Date: 10/19/2019 CLINICAL DATA:  74 year old male with hypotension and altered mental status. EXAM: PORTABLE CHEST 1 VIEW COMPARISON:  06/02/2019 and prior radiographs FINDINGS: Cardiomegaly and pulmonary vascular congestion noted. There is a possible layering RIGHT pleural effusion. No pneumothorax. No acute bony abnormalities are present. IMPRESSION: Cardiomegaly with pulmonary vascular congestion and possible layering RIGHT pleural effusion. Electronically Signed   By: Harmon Pier M.D.   On: 10/19/2019 16:43    Procedures Procedure Name: Intubation Date/Time: 10/19/2019 8:57 PM Performed by: Vanetta Mulders, MD Pre-anesthesia Checklist: Patient identified, Patient being monitored, Timeout performed, Emergency Drugs available and Suction available Oxygen Delivery Method: Ambu bag Preoxygenation: Pre-oxygenation with 100% oxygen Ventilation: Mask ventilation without difficulty Laryngoscope Size: Glidescope and  4 Tube size: 7.5 mm Number of  attempts: 1 Airway Equipment and Method: Rigid stylet Placement Confirmation: ETT inserted through vocal cords under direct vision,  CO2 detector and Breath sounds checked- equal and bilateral Secured at: 25 cm Tube secured with: ETT holder      (including critical care time)  CRITICAL CARE Performed by: Vanetta Mulders Total critical care time: 120 minutes Critical care time was exclusive of separately billable procedures and treating other patients. Critical care was necessary to treat or prevent imminent or life-threatening deterioration. Critical care was time spent personally by me on the following activities: development of treatment plan with patient and/or surrogate as well as nursing, discussions with consultants, evaluation of patient's response to treatment, examination of patient, obtaining history from patient or surrogate, ordering and performing treatments and interventions, ordering and review of laboratory studies, ordering and review of radiographic studies, pulse oximetry and re-evaluation of patient's condition.    Medications Ordered in ED Medications  0.9 %  sodium chloride infusion ( Intravenous New Bag/Given 10/19/19 1835)  ceFEPIme (MAXIPIME) 2 g in sodium chloride 0.9 % 100 mL IVPB (has no administration in time range)  vancomycin (VANCOREADY) IVPB 1250 mg/250 mL (has no administration in time range)  sodium chloride 0.9 % bolus 1,000 mL (0 mLs Intravenous Stopped 10/19/19 1752)    And  sodium chloride 0.9 % bolus 1,000 mL (0 mLs Intravenous Stopped 10/19/19 1753)    And  sodium chloride 0.9 % bolus 1,000 mL (0 mLs Intravenous Stopped 10/19/19 1836)    And  sodium chloride 0.9 % bolus 1,000 mL (0 mLs Intravenous Stopped 10/19/19 1836)  ceFEPIme (MAXIPIME) 2 g in sodium chloride 0.9 % 100 mL IVPB (0 g Intravenous Stopped 10/19/19 1656)  metroNIDAZOLE (FLAGYL) IVPB 500 mg (0 mg Intravenous Stopped 10/19/19 1734)  vancomycin (VANCOREADY) IVPB 2000 mg/400 mL (0 mg  Intravenous Stopped 10/19/19 1835)  insulin aspart (novoLOG) injection 5 Units (5 Units Intravenous Given 10/19/19 1709)    And  dextrose 50 % solution 50 mL (50 mLs Intravenous Given 10/19/19 1709)  sodium chloride 0.9 % bolus 1,000 mL (0 mLs Intravenous Stopped 10/19/19 1941)  calcium gluconate 1 g/ 50 mL sodium chloride IVPB (0 g Intravenous Stopped 10/19/19 2007)    ED Course  I have reviewed the triage vital signs and the nursing notes.  Pertinent labs & imaging results that were available during my care of the patient were reviewed by me and considered in my medical decision making (see chart for details).    MDM Rules/Calculators/A&P                          Patient arrived awake but had hypoxia on 4 L nasal cannula oxygen and was hypotensive.  Patient was lethargic though.  Patient started on high flow nasal cannula oxygen sats came up into the 90s.  Patient's cardiac monitoring EKG showed a bradycardia with either an atrial fib or atrial flutter rhythm.  Patient's mucous membranes were dry.  Patient had 2 peripheral IVs started based on sepsis protocol which was initiated due to the hypotension and the fact that his temperature was actually low.  Patient was to receive 4 L of fluids.  And this was completed.  At the end of that he started to make some urine.  And blood pressures did improve.  He was given a 5 liter.  Blood pressures came up and stayed up around the 1 10-1 20 mark.  As the evening went on it was noted patient was becoming more somnolent.  And then became nonverbal.  Then his oxygen sats started to drop.  Patient was intubated based on this. Patient was intubated without any induction medications without any difficulty.  Following intubation oxygen sats stayed up.  And we were able to back him prior to intubation to bring his oxygen sats into the upper 90s.  Prior to the intubation hospitalist have been contacted for admission since his blood pressures had  stabilized.  And his heart rate had actually improved up into the upper 50s 60s.  Labs showed a significant acute kidney injury probably prerenal.  Suspect that his normal medications that he takes for atrial for which includes a calcium channel blocker and a beta-blocker probably were at high levels.  No evidence of source for sepsis based on chest x-ray.  Urine still pending.  The patient was treated with broad-spectrum antibiotics as per sepsis protocol.   Patient will get a head CT done repeat chest x-ray and will get a blood gas done.  Hospitalist for planning admission here.  Patient remains hemodynamically stable.  Following fluids patient's initial lactic acid which was over 5 came down to 3.8's of less than 4.  Patient also had an elevated potassium of 6.8.  He was treated with insulin and D50 for this.  And also calcium gluconate.  Following the fluids potassium did improve some down to 6.1.  And following the treatment.  This will need continued monitoring.  Patient's blood sugars have been stable.    Final Clinical Impression(s) / ED Diagnoses Final diagnoses:  Sepsis with acute renal failure, due to unspecified organism, unspecified acute renal failure type, unspecified whether septic shock present (HCC)  AKI (acute kidney injury) [facility])    Rx / DC Orders ED Discharge Orders    None       Vanetta Mulders, MD 10/19/19 2109  Addendum:  Patient intubated successfully.  Patient given some more Versed to settle him down to 5 mg made him very sedated.  There was some waxing and waning of his blood pressures but still seemed at the trend was his pressures were above 90 or above 100 systolic.  Heart rate monitor that was at the bedside was reading heart rate of 31 but the ZOLL monitor was consistent of 52 which was consistent to where he was.  Nursing manager did not want patient to be admitted to ICU here.  Head CT was negative.  Patient's blood gas showed acidosis but  otherwise was looking pretty good.   Patient alert now just receiving some Versed and fentanyl as needed for sedation.   General have been some marginal blood pressures not sure if they are accurate or not.  We will go ahead and order norepinephrine peripherally.  In addition there was discussion of thought early on of putting in central line but he responded pressures responded very well to fluids.  And his blood pressures were fine even when discussing with critical care at [facility].   To this though I discussed the patient with critical care [facility] and due to his valve renal situation they agreed to admit him there.  We have felt that his pressures were stable may be they have just changed recently.   Spoke with Dr. Warrick Parisian patient formally admitted to the overnight person Dr. Jayme Cloud.  ICU at [facility].    Vanetta Mulders, MD 10/19/19 2350    Vanetta Mulders, MD 10/20/19 [phone]

## 2019-10-19 NOTE — ED Notes (Signed)
Date and time results received: 10/19/19  1652 (use smartphrase ".now" to insert current time)  Test: K+ Critical Value: 6.8  Name of Provider Notified: EDP. Zackowski  Orders Received? Or Actions Taken?: N/A

## 2019-10-19 NOTE — Progress Notes (Signed)
Pharmacy Antibiotic Note  Jonathan Wise is a 74 y.o. male admitted on 10/19/2019 with sepsis.  Pharmacy has been consulted for cefepime and vancomycin dosing.  Plan: Cefepime 2gm iv q12h Vancomycin 1.25gm iv q24h   Weight: 119.7 kg (264 lb)  Temp (24hrs), Avg:97.3 F (36.3 C), Min:96.3 F (35.7 C), Max:99.3 F (37.4 C)  Recent Labs  Lab 10/19/19 1610 10/19/19 1612  WBC 21.5*  --   CREATININE 3.10*  --   LATICACIDVEN  --  5.1*    Estimated Creatinine Clearance: 28.4 mL/min (A) (by C-G formula based on SCr of 3.1 mg/dL (H)).    No Known Allergies  Antimicrobials this admission: 7/10 vancomycin >> 7/10 cefepime >>    Microbiology results: 7/10 BCx: sent 7/10 UCx: sent  7/10 Covid 19: sent   Thank you for allowing pharmacy to be a part of this patient's care.  Gerre Pebbles Manilla Strieter 10/19/2019 6:48 PM

## 2019-10-19 NOTE — Progress Notes (Signed)
Called to admit patient for severe sepsis with significant lactic acidosis, acute respiratory failure requiring mechanical intubation, metabolic encephalopathy, acute kidney injury and hyperkalemia.  Initial hypotension resolved with IV fluids bolus , but patient may likely require pressors.  Broad-spectrum antibiotics started.  Cultures obtained.  EDP talked to critical care, patient will be admitted to [facility].  Wendall Stade, MD. TRH.

## 2019-10-19 NOTE — Code Documentation (Signed)
ET Tube placed 2046  26 at lip

## 2019-10-20 ENCOUNTER — Inpatient Hospital Stay (HOSPITAL_COMMUNITY): Payer: Medicare Other

## 2019-10-20 DIAGNOSIS — A419 Sepsis, unspecified organism: Principal | ICD-10-CM

## 2019-10-20 DIAGNOSIS — J9601 Acute respiratory failure with hypoxia: Secondary | ICD-10-CM

## 2019-10-20 DIAGNOSIS — R6520 Severe sepsis without septic shock: Secondary | ICD-10-CM

## 2019-10-20 LAB — POCT I-STAT 7, (LYTES, BLD GAS, ICA,H+H)
Acid-base deficit: 3 mmol/L — ABNORMAL HIGH (ref 0.0–2.0)
Acid-base deficit: 3 mmol/L — ABNORMAL HIGH (ref 0.0–2.0)
Bicarbonate: 19.3 mmol/L — ABNORMAL LOW (ref 20.0–28.0)
Bicarbonate: 19.7 mmol/L — ABNORMAL LOW (ref 20.0–28.0)
Calcium, Ion: 1.03 mmol/L — ABNORMAL LOW (ref 1.15–1.40)
Calcium, Ion: 1.07 mmol/L — ABNORMAL LOW (ref 1.15–1.40)
HCT: 37 % — ABNORMAL LOW (ref 39.0–52.0)
HCT: 37 % — ABNORMAL LOW (ref 39.0–52.0)
Hemoglobin: 12.6 g/dL — ABNORMAL LOW (ref 13.0–17.0)
Hemoglobin: 12.6 g/dL — ABNORMAL LOW (ref 13.0–17.0)
O2 Saturation: 99 %
O2 Saturation: 99 %
Patient temperature: 36.2
Patient temperature: 96.2
Potassium: 5.3 mmol/L — ABNORMAL HIGH (ref 3.5–5.1)
Potassium: 5.2 mmol/L — ABNORMAL HIGH (ref 3.5–5.1)
Sodium: 140 mmol/L (ref 135–145)
Sodium: 140 mmol/L (ref 135–145)
TCO2: 20 mmol/L — ABNORMAL LOW (ref 22–32)
TCO2: 21 mmol/L — ABNORMAL LOW (ref 22–32)
pCO2 arterial: 24.8 mmHg — ABNORMAL LOW (ref 32.0–48.0)
pCO2 arterial: 26.7 mmHg — ABNORMAL LOW (ref 32.0–48.0)
pH, Arterial: 7.496 — ABNORMAL HIGH (ref 7.350–7.450)
pH, Arterial: 7.47 — ABNORMAL HIGH (ref 7.350–7.450)
pO2, Arterial: 138 mmHg — ABNORMAL HIGH (ref 83.0–108.0)
pO2, Arterial: 138 mmHg — ABNORMAL HIGH (ref 83.0–108.0)

## 2019-10-20 LAB — BASIC METABOLIC PANEL
Anion gap: 17 — ABNORMAL HIGH (ref 5–15)
Anion gap: 16 — ABNORMAL HIGH (ref 5–15)
BUN: 49 mg/dL — ABNORMAL HIGH (ref 8–23)
BUN: 50 mg/dL — ABNORMAL HIGH (ref 8–23)
CO2: 17 mmol/L — ABNORMAL LOW (ref 22–32)
CO2: 21 mmol/L — ABNORMAL LOW (ref 22–32)
Calcium: 8 mg/dL — ABNORMAL LOW (ref 8.9–10.3)
Calcium: 7.8 mg/dL — ABNORMAL LOW (ref 8.9–10.3)
Chloride: 105 mmol/L (ref 98–111)
Chloride: 104 mmol/L (ref 98–111)
Creatinine, Ser: 3.21 mg/dL — ABNORMAL HIGH (ref 0.61–1.24)
Creatinine, Ser: 3.12 mg/dL — ABNORMAL HIGH (ref 0.61–1.24)
GFR calc Af Amer: 21 mL/min — ABNORMAL LOW (ref >60)
GFR calc Af Amer: 22 mL/min — ABNORMAL LOW (ref >60)
GFR calc non Af Amer: 18 mL/min — ABNORMAL LOW (ref >60)
GFR calc non Af Amer: 19 mL/min — ABNORMAL LOW (ref >60)
Glucose, Bld: 137 mg/dL — ABNORMAL HIGH (ref 70–99)
Glucose, Bld: 169 mg/dL — ABNORMAL HIGH (ref 70–99)
Potassium: 5.5 mmol/L — ABNORMAL HIGH (ref 3.5–5.1)
Potassium: 4.8 mmol/L (ref 3.5–5.1)
Sodium: 139 mmol/L (ref 135–145)
Sodium: 141 mmol/L (ref 135–145)

## 2019-10-20 LAB — URINALYSIS, ROUTINE W REFLEX MICROSCOPIC
Bilirubin Urine: NEGATIVE
Glucose, UA: NEGATIVE mg/dL
Ketones, ur: NEGATIVE mg/dL
Nitrite: NEGATIVE
Protein, ur: NEGATIVE mg/dL
RBC / HPF: >50 RBC/hpf — ABNORMAL HIGH (ref 0–5)
Specific Gravity, Urine: 1.012 (ref 1.005–1.030)
pH: 5 (ref 5.0–8.0)

## 2019-10-20 LAB — LACTIC ACID, PLASMA
Lactic Acid, Venous: 3.1 mmol/L (ref 0.5–1.9)
Lactic Acid, Venous: 1.4 mmol/L (ref 0.5–1.9)
Lactic Acid, Venous: 1.2 mmol/L (ref 0.5–1.9)

## 2019-10-20 LAB — GLUCOSE, CAPILLARY
Glucose-Capillary: 134 mg/dL — ABNORMAL HIGH (ref 70–99)
Glucose-Capillary: 140 mg/dL — ABNORMAL HIGH (ref 70–99)
Glucose-Capillary: 138 mg/dL — ABNORMAL HIGH (ref 70–99)
Glucose-Capillary: 121 mg/dL — ABNORMAL HIGH (ref 70–99)
Glucose-Capillary: 115 mg/dL — ABNORMAL HIGH (ref 70–99)
Glucose-Capillary: 113 mg/dL — ABNORMAL HIGH (ref 70–99)

## 2019-10-20 LAB — CBC
HCT: 39.4 % (ref 39.0–52.0)
Hemoglobin: 11 g/dL — ABNORMAL LOW (ref 13.0–17.0)
MCH: 24.1 pg — ABNORMAL LOW (ref 26.0–34.0)
MCHC: 27.9 g/dL — ABNORMAL LOW (ref 30.0–36.0)
MCV: 86.4 fL (ref 80.0–100.0)
Platelets: 474 10*3/uL — ABNORMAL HIGH (ref 150–400)
RBC: 4.56 MIL/uL (ref 4.22–5.81)
RDW: 15.9 % — ABNORMAL HIGH (ref 11.5–15.5)
WBC: 31 10*3/uL — ABNORMAL HIGH (ref 4.0–10.5)
nRBC: 0.3 % — ABNORMAL HIGH (ref 0.0–0.2)

## 2019-10-20 LAB — HEPARIN LEVEL (UNFRACTIONATED): Heparin Unfractionated: 1.5 IU/mL — ABNORMAL HIGH (ref 0.30–0.70)

## 2019-10-20 LAB — ECHOCARDIOGRAM COMPLETE
Height: 70 in
Weight: 4878.34 oz

## 2019-10-20 LAB — APTT
aPTT: 30 seconds (ref 24–36)
aPTT: 77 seconds — ABNORMAL HIGH (ref 24–36)

## 2019-10-20 LAB — PHOSPHORUS
Phosphorus: 5.2 mg/dL — ABNORMAL HIGH (ref 2.5–4.6)
Phosphorus: 3.8 mg/dL (ref 2.5–4.6)

## 2019-10-20 LAB — AMMONIA: Ammonia: 35 umol/L (ref 9–35)

## 2019-10-20 LAB — MAGNESIUM
Magnesium: 1.6 mg/dL — ABNORMAL LOW (ref 1.7–2.4)
Magnesium: 1.5 mg/dL — ABNORMAL LOW (ref 1.7–2.4)

## 2019-10-20 LAB — PROCALCITONIN: Procalcitonin: 2.8 ng/mL

## 2019-10-20 LAB — MRSA PCR SCREENING: MRSA by PCR: POSITIVE — AB

## 2019-10-20 MED ORDER — FUROSEMIDE 10 MG/ML IJ SOLN
60.0000 mg | Freq: Once | INTRAMUSCULAR | Status: AC
  Administered 2019-10-20: 60 mg via INTRAVENOUS
  Filled 2019-10-20: qty 6

## 2019-10-20 MED ORDER — MUPIROCIN 2 % EX OINT
TOPICAL_OINTMENT | Freq: Two times a day (BID) | CUTANEOUS | Status: DC
  Administered 2019-10-22 – 2019-11-04 (×10): 1 via NASAL
  Filled 2019-10-20 (×5): qty 22

## 2019-10-20 MED ORDER — CHLORHEXIDINE GLUCONATE CLOTH 2 % EX PADS
6.0000 | MEDICATED_PAD | Freq: Every day | CUTANEOUS | Status: DC
  Administered 2019-10-20 – 2019-10-29 (×7): 6 via TOPICAL

## 2019-10-20 MED ORDER — FENTANYL CITRATE (PF) 100 MCG/2ML IJ SOLN
100.0000 ug | Freq: Once | INTRAMUSCULAR | Status: DC
  Filled 2019-10-20: qty 2

## 2019-10-20 MED ORDER — CHLORHEXIDINE GLUCONATE 0.12% ORAL RINSE (MEDLINE KIT)
15.0000 mL | Freq: Two times a day (BID) | OROMUCOSAL | Status: DC
  Administered 2019-10-20 – 2019-11-04 (×20): 15 mL via OROMUCOSAL

## 2019-10-20 MED ORDER — PERFLUTREN LIPID MICROSPHERE
1.0000 mL | INTRAVENOUS | Status: AC | PRN
  Administered 2019-10-20: 4 mL via INTRAVENOUS
  Filled 2019-10-20: qty 10

## 2019-10-20 MED ORDER — HEPARIN (PORCINE) 25000 UT/250ML-% IV SOLN
1350.0000 [IU]/h | INTRAVENOUS | Status: AC
  Administered 2019-10-20: 1400 [IU]/h via INTRAVENOUS
  Administered 2019-10-21: 1300 [IU]/h via INTRAVENOUS
  Administered 2019-10-22 (×2): 1350 [IU]/h via INTRAVENOUS
  Filled 2019-10-20 (×4): qty 250

## 2019-10-20 MED ORDER — POLYETHYLENE GLYCOL 3350 17 G PO PACK: 17.0000 g | PACK | Freq: Every day | ORAL | Status: DC | PRN

## 2019-10-20 MED ORDER — SODIUM CHLORIDE 0.9 % IV SOLN
1.0000 g | INTRAVENOUS | Status: DC
  Administered 2019-10-21: 1 g via INTRAVENOUS
  Filled 2019-10-20: qty 1

## 2019-10-20 MED ORDER — ORAL CARE MOUTH RINSE
15.0000 mL | OROMUCOSAL | Status: DC
  Administered 2019-10-20 – 2019-10-22 (×21): 15 mL via OROMUCOSAL

## 2019-10-20 MED ORDER — MIDAZOLAM HCL 2 MG/2ML IJ SOLN
2.0000 mg | Freq: Once | INTRAMUSCULAR | Status: AC
  Administered 2019-10-20: 2 mg via INTRAVENOUS
  Filled 2019-10-20: qty 2

## 2019-10-20 MED ORDER — POLYETHYLENE GLYCOL 3350 17 G PO PACK
17.0000 g | PACK | Freq: Every day | ORAL | Status: DC
  Administered 2019-10-20 – 2019-10-21 (×2): 17 g via ORAL
  Filled 2019-10-20 (×2): qty 1

## 2019-10-20 MED ORDER — DOCUSATE SODIUM 100 MG PO CAPS: 100.0000 mg | ORAL_CAPSULE | Freq: Two times a day (BID) | ORAL | Status: DC | PRN

## 2019-10-20 MED ORDER — SODIUM BICARBONATE 8.4 % IV SOLN
50.0000 meq | Freq: Once | INTRAVENOUS | Status: AC
  Administered 2019-10-20: 50 meq via INTRAVENOUS
  Filled 2019-10-20: qty 50

## 2019-10-20 MED ORDER — KETAMINE HCL 10 MG/ML IJ SOLN
INTRAMUSCULAR | Status: AC
  Filled 2019-10-20: qty 1

## 2019-10-20 MED ORDER — SODIUM CHLORIDE 0.9 % IV SOLN: INTRAVENOUS | Status: DC | PRN

## 2019-10-20 MED ORDER — SODIUM BICARBONATE-DEXTROSE 150-5 MEQ/L-% IV SOLN
150.0000 meq | INTRAVENOUS | Status: DC
  Administered 2019-10-20: 150 meq via INTRAVENOUS
  Filled 2019-10-20 (×2): qty 1000

## 2019-10-20 MED ORDER — VANCOMYCIN VARIABLE DOSE PER UNSTABLE RENAL FUNCTION (PHARMACIST DOSING): Status: DC

## 2019-10-20 MED ORDER — KETAMINE HCL 10 MG/ML IJ SOLN
240.0000 mg | Freq: Once | INTRAMUSCULAR | Status: AC
  Administered 2019-10-20: 240 mg via INTRAVENOUS

## 2019-10-20 MED ORDER — MAGNESIUM SULFATE 2 GM/50ML IV SOLN
2.0000 g | Freq: Once | INTRAVENOUS | Status: AC
  Administered 2019-10-20: 2 g via INTRAVENOUS
  Filled 2019-10-20: qty 50

## 2019-10-20 MED ORDER — FAMOTIDINE IN NACL 20-0.9 MG/50ML-% IV SOLN
20.0000 mg | INTRAVENOUS | Status: DC
  Administered 2019-10-21 – 2019-10-23 (×3): 20 mg via INTRAVENOUS
  Filled 2019-10-20 (×3): qty 50

## 2019-10-20 MED ORDER — FENTANYL BOLUS VIA INFUSION
25.0000 ug | INTRAVENOUS | Status: DC | PRN
  Administered 2019-10-21: 25 ug via INTRAVENOUS
  Filled 2019-10-20: qty 25

## 2019-10-20 MED ORDER — INSULIN ASPART 100 UNIT/ML ~~LOC~~ SOLN
0.0000 [IU] | SUBCUTANEOUS | Status: DC
  Administered 2019-10-20 – 2019-10-21 (×4): 1 [IU] via SUBCUTANEOUS
  Administered 2019-10-21: 2 [IU] via SUBCUTANEOUS
  Administered 2019-10-21 (×4): 1 [IU] via SUBCUTANEOUS
  Administered 2019-10-22 (×3): 2 [IU] via SUBCUTANEOUS
  Administered 2019-10-22 – 2019-10-24 (×4): 1 [IU] via SUBCUTANEOUS

## 2019-10-20 MED ORDER — FENTANYL CITRATE (PF) 100 MCG/2ML IJ SOLN
100.0000 ug | Freq: Once | INTRAMUSCULAR | Status: AC
  Administered 2019-10-20: 100 ug via INTRAVENOUS
  Filled 2019-10-20: qty 2

## 2019-10-20 MED ORDER — INSULIN ASPART 100 UNIT/ML ~~LOC~~ SOLN: 0.0000 [IU] | SUBCUTANEOUS | Status: DC

## 2019-10-20 MED ORDER — SODIUM BICARBONATE 8.4 % IV SOLN
INTRAVENOUS | Status: DC
  Filled 2019-10-20 (×5): qty 850

## 2019-10-20 MED ORDER — FENTANYL 2500MCG IN NS 250ML (10MCG/ML) PREMIX INFUSION
25.0000 ug/h | INTRAVENOUS | Status: DC
  Administered 2019-10-20: 50 ug/h via INTRAVENOUS
  Administered 2019-10-20 – 2019-10-21 (×2): 200 ug/h via INTRAVENOUS
  Administered 2019-10-22: 100 ug/h via INTRAVENOUS
  Filled 2019-10-20 (×4): qty 250

## 2019-10-20 NOTE — Procedures (Signed)
Central Venous Catheter Insertion Procedure Note  MATIS MONNIER  [id number]  [birth year]  Date:10/20/19  Time:5:29 AM    Provider Performing: Charlotte Sanes    Procedure: Insertion of Arterial Line (72182) without US guidance  Indication(s) Blood pressure monitoring and/or need for frequent ABGs  Consent Unable to obtain consent due to inability to find a medical decision maker for patient.  All reasonable efforts were made.  Another independent medical provider, Vernona Rieger Gleason , confirmed the benefits of this procedure outweigh the risks.  Anesthesia Lidocaine 1%   Time Out Verified patient identification, verified procedure, site/side was marked, verified correct patient position, special equipment/implants available, medications/allergies/relevant history reviewed, required imaging and test results available.   Sterile Technique Maximal sterile technique including full sterile barrier drape, hand hygiene, sterile gown, sterile gloves, mask, hair covering, sterile ultrasound probe cover (if used).   Procedure Description Area of catheter insertion was cleaned with chlorhexidine and draped in sterile fashion. Without real-time ultrasound guidance an arterial catheter was placed into the right radial artery.  Appropriate arterial tracings confirmed on monitor.    Initial attempts on L radial, accessed artery x 2 but unable to thread wire.    Complications/Tolerance None; patient tolerated the procedure well.   EBL Minimal   Specimen(s) None

## 2019-10-20 NOTE — Progress Notes (Signed)
Pharmacy Antibiotic Note  Jonathan Wise is a 74 y.o. male admitted on 10/19/2019 with concern for sepsis. Pharmacy has been consulted for Vancomycin + Cefepime dosing.  The patient received a Vancomycin loading dose of 2g x 1 on 7/11 @ 1630. Will hold doses for now with AKI and check a random with AM labs.  Will adjust Cefepime doses as well. SCr up to 3.21 << 2.86 (BL 0.4-0.6)  Plan: - Hold Vancomycin with AKI - Adjust Cefepime to 1g IV every 24 hours - Per CCM discussion with family, does not want to pursue dialysis options - Will continue to follow renal function, culture results, LOT, and antibiotic de-escalation plans   Height: 5\' 10"  (177.8 cm) Weight: (!) 138.3 kg (304 lb 14.3 oz) IBW/kg (Calculated) : 73  Temp (24hrs), Avg:96.1 F (35.6 C), Min:94.8 F (34.9 C), Max:99.3 F (37.4 C)  Recent Labs  Lab 10/19/19 1610 10/19/19 1612 10/19/19 1843 10/20/19 0408  WBC 21.5*  --   --  31.0*  CREATININE 3.10*  --  2.86* 3.21*  LATICACIDVEN  --  5.1* 3.8* 3.1*    Estimated Creatinine Clearance: 28.3 mL/min (A) (by C-G formula based on SCr of 3.21 mg/dL (H)).    No Known Allergies  Antimicrobials this admission: Vanc 7/11 >> Cefepime 7/11 >>  Dose adjustments this admission:  Microbiology results: 7/11 BCx >> ng<24h 7/11 UCx >> pending 7/11 RCx >> pending 7/11 COVID >> neg 7/12 MRSA PCR >> positive  Thank you for allowing pharmacy to be a part of this patient's care.  9/12, PharmD, BCPS Clinical Pharmacist Clinical phone for 10/20/2019: 12/21/2019 10/20/2019 8:25 AM   **Pharmacist phone directory can now be found on [email or web address] ([email or web address]).  Listed under [facility].

## 2019-10-20 NOTE — Progress Notes (Signed)
ANTICOAGULATION CONSULT NOTE - Follow Up Consult  Pharmacy Consult for heparin Indication: atrial fibrillation  Labs: Recent Labs    10/19/19 1610 10/19/19 1610 10/19/19 1843 10/20/19 0408 10/20/19 0408 10/20/19 0538 10/20/19 8502 10/20/19 1034 10/20/19 1237 10/20/19 2228  HGB 11.8*   < >  --  11.0*   < > 12.6* 12.6*  --   --   --   HCT 45.3   < >  --  39.4  --  37.0* 37.0*  --   --   --   PLT 520*  --   --  474*  --   --   --   --   --   --   APTT 31  --   --   --   --   --   --  30  --  77*  LABPROT 18.2*  --   --   --   --   --   --   --   --   --   INR 1.6*  --   --   --   --   --   --   --   --   --   HEPARINUNFRC  --   --   --   --   --   --   --  1.50*  --   --   CREATININE 3.10*   < > 2.86* 3.21*  --   --   --   --  3.12*  --    < > = values in this interval not displayed.    Assessment/Plan:  74yo male therapeutic on heparin with initial dosing while Eliquis on hold. Will continue gtt at current rate and confirm stable with am labs.   Vernard Gambles, PharmD, BCPS  10/20/2019,10:51 PM

## 2019-10-20 NOTE — Progress Notes (Signed)
Initial Nutrition Assessment  DOCUMENTATION CODES:   Morbid obesity  INTERVENTION:   Tube feeding recommendations: - Vital High Protein @ 60 ml/hr (1440 ml/day) - ProSource TF 45 ml TID  Recommended tube feeding regimen provides 1560 kcal, 159 grams of protein, and 1204 ml of H2O.  NUTRITION DIAGNOSIS:   Inadequate oral intake related to inability to eat as evidenced by NPO status.  GOAL:   Provide needs based on ASPEN/SCCM guidelines  MONITOR:   Vent status, Labs, Weight trends, Skin, I & O's  REASON FOR ASSESSMENT:   Ventilator    ASSESSMENT:   74 year old male who presented on 7/11 with AMS. PMH of atrial fibrillation, T2DM, DVT, HTN, HLD, bedbound with chronic LE wounds. Pt admitted with AKI, hyperkalemia, septic shock and required intubation due to acute hypoxic respiratory failure.   Discussed pt with RN and during ICU rounds. MD met with family who confirmed DNR status and confirmed pt would not want dialysis if needed. Per note, "if worsens likely transition to comfort care."  Plan is to hold on TF for now. RD will leave TF recommendations.  OG tube with tip "probably within a gastric hiatal hernia" per x-ray reading, currently to low intermittent suction.  Admission weight was 119.7 kg. Will use this as EDW. Pt with mild pitting generalized edema and moderate pitting edema to BUE and BLE per RN documentation.  Patient is currently intubated on ventilator support MV: 12.4 L/min Temp (24hrs), Avg:96.9 F (36.1 C), Min:94.8 F (34.9 C), Max:99.3 F (37.4 C) BP (a-line): 100/41 MAP (a-line): 59  Drips: Fentanyl: 20 ml/hr Levophed: 37.5 ml/hr Sodium bicarb: 100 ml/hr  Medications reviewed and include: colace, SSI q 4 hours, miralax, IV abx, IV pepcid  Labs reviewed: potassium 5.2, BUN 49, creatinine 3.21, ionized calcium 1.07, phosphorus 5.2, magnesium 1.6, lactic acid 3.1 CBG's: 88-140 x 24 hours  I/O's: +7.9 L since admit  NUTRITION - FOCUSED  PHYSICAL EXAM:  Deferred. Multiple family members in room discussing [place] status.  Diet Order:   Diet Order            Diet NPO time specified  Diet effective now                 EDUCATION NEEDS:   No education needs have been identified at this time  Skin:  Skin Assessment: Skin Integrity Issues: Unstageable: left buttock Other: cellulitis to BLE  Last BM:  10/20/19  Height:   Ht Readings from Last 1 Encounters:  10/20/19 _0  (1.778 m)    Weight:   Wt Readings from Last 1 Encounters:  10/20/19 (!) 138.3 kg    Ideal Body Weight:  75.5 kg  BMI:  Body mass index is 43.75 kg/m.  Estimated Nutritional Needs:   Kcal:  8469-6295  Protein:  151-185 grams  Fluid:  1.4-1.6 L    Gaynell Face, MS, RD, LDN Inpatient Clinical Dietitian Please see AMiON for contact information.

## 2019-10-20 NOTE — Progress Notes (Addendum)
PCCM:  I called and spoke with the patients wife and well as granddaughter via phone. They are on their way to hospital now.   Garner Nash, DO [facility] 10/20/2019 8:48 AM     I met with patients family. Wife and two grand daugthers.   They confirmed his DNR status.   Also confirmed would NOT want dialysis if needed   If worsens likely transition to comfort care   Garner Nash, DO [facility] 10/20/2019 10:18 AM

## 2019-10-20 NOTE — Progress Notes (Signed)
ANTICOAGULATION CONSULT NOTE - Initial Consult  Pharmacy Consult for Heparin Indication: atrial fibrillation (while Apixaban on hold)  No Known Allergies  Patient Measurements: Height: 5\' 10"  (177.8 cm) Weight: (!) 138.3 kg (304 lb 14.3 oz) IBW/kg (Calculated) : 73 Heparin Dosing Weight: 105 kg  Vital Signs: Temp: 98.2 F (36.8 C) (07/12 0900) BP: 107/50 (07/12 0600) Pulse Rate: 63 (07/12 0900)  Labs: Recent Labs    10/19/19 1610 10/19/19 1610 10/19/19 1843 10/20/19 0408 10/20/19 0408 10/20/19 0538 10/20/19 0632  HGB 11.8*   < >  --  11.0*   < > 12.6* 12.6*  HCT 45.3   < >  --  39.4  --  37.0* 37.0*  PLT 520*  --   --  474*  --   --   --   APTT 31  --   --   --   --   --   --   LABPROT 18.2*  --   --   --   --   --   --   INR 1.6*  --   --   --   --   --   --   CREATININE 3.10*  --  2.86* 3.21*  --   --   --    < > = values in this interval not displayed.    Estimated Creatinine Clearance: 28.3 mL/min (A) (by C-G formula based on SCr of 3.21 mg/dL (H)).   Medical History: Past Medical History:  Diagnosis Date  . Atrial fibrillation (HCC)    On Pradaxa  . Bilateral lower extremity edema   . Cellulitis   . DVT (deep venous thrombosis) (HCC) 1994   LLE. Completed tx with coumadin  . Hyperlipidemia   . Hypertension   . Kidney stones   . Morbid obesity (HCC)   . Pressure ulcer   . Pulmonary artery hypertension (HCC) 10/18/2011  . Sleep apnea   . Type 2 diabetes mellitus (HCC)   . Urinary retention 10/15/2011  . Venous stasis ulcers (HCC)     Assessment: 5 YOM who presented who 7/11 with AMS, AKI, and concern for sepsis. The patient was on apixaban PTA for hx Afib - the plan is to bridge with Heparin while awaiting AKI resolution.   The patient's last dose of Apixaban was on 7/11 AM.  Baseline HL 1.5 - and not as high as expected given risk for accumulation with AKI, baseline aPTT 30. Will plan to start Heparin for bridging. Will monitor aPTTs initially given  false elevation in HL.   Goal of Therapy:  Heparin level 0.3-0.7 units/ml aPTT 66-102 seconds Monitor platelets by anticoagulation protocol: Yes   Plan:  - Start Heparin at 1400 units/hr (14 ml/hr)  - Daily aPTT/HL until correlated - Will continue to monitor for any signs/symptoms of bleeding and will follow up with aPTT level in 8 hours   Thank you for allowing pharmacy to be a part of this patient's care.  9/11, PharmD, BCPS Clinical Pharmacist Clinical phone for 10/20/2019: 12/21/2019 10/20/2019 12:28 PM   **Pharmacist phone directory can now be found on [email or web address] ([email or web address]).  Listed under [facility].

## 2019-10-20 NOTE — Progress Notes (Signed)
  Echocardiogram 2D Echocardiogram has been performed.  Jonathan Wise 10/20/2019, 2:22 PM

## 2019-10-20 NOTE — Consult Note (Addendum)
WOC Nurse Consult Note: Reason for Consult: Consult requested for bilat legs.  Pt is familiar to [facility] team from previous admissions.   Wound type: Bilat anterior legs with scattered full thickness stasis ulcers.  Red and moist, small amt pink drainage, surrounded by dry peeling scaley skin.  Measurement: Left leg 4X3X.2cm, .8X.8X.2cm, 1X1X.2cm Right leg 5X5X.2cm and 1X1X.2cm Dressing procedure/placement/frequency: Topical treatment orders provided for bedside nurses to perform as follows to promote healing. Apply xeroform gauze to bilat leg wounds Q day, then cover with ABD pads and kerlex. Please re-consult if further assistance is needed.  Thank-you,  Cammie Mcgee MSN, RN, CWOCN, [place], CNS [phone]

## 2019-10-20 NOTE — ED Notes (Signed)
Patient given 2 additional fluid bolus of normal saline verbal order by Dr. Deretha Emory when patient was hypotensive before pressors were started.

## 2019-10-20 NOTE — Progress Notes (Signed)
RT attempted to place pt on SBT CPAP/PS.  Pt remained apneic with no effort despite stimulation. Pt returned to full support at this time. RT will attempt at a later time.

## 2019-10-20 NOTE — Procedures (Signed)
Central Venous Catheter Insertion Procedure Note  Jonathan Wise  [id number]  1945-04-25  Date:10/20/19  Time:3:23 AM   Provider Performing:Chalyn Amescua R Florence Yeung   Procedure: Insertion of Non-tunneled Central Venous Catheter(36556) with US guidance (16945)   Indication(s) Medication administration and Difficult access  Consent Unable to obtain consent due to emergent nature of procedure.  Anesthesia Topical only with 1% lidocaine   Timeout Verified patient identification, verified procedure, site/side was marked, verified correct patient position, special equipment/implants available, medications/allergies/relevant history reviewed, required imaging and test results available.  Sterile Technique Maximal sterile technique including full sterile barrier drape, hand hygiene, sterile gown, sterile gloves, mask, hair covering, sterile ultrasound probe cover (if used).  Procedure Description Area of catheter insertion was cleaned with chlorhexidine and draped in sterile fashion.  With real-time ultrasound guidance a central venous catheter was placed into the left internal jugular vein. Nonpulsatile blood flow and easy flushing noted in all ports.  The catheter was sutured in place and sterile dressing applied.  Complications/Tolerance None; patient tolerated the procedure well. Chest X-ray is ordered to verify placement for internal jugular or subclavian cannulation.   Chest x-ray is not ordered for femoral cannulation.  EBL Minimal  Specimen(s) None   [name] Xue Low, PA-C

## 2019-10-20 NOTE — Progress Notes (Signed)
eLink Physician-Brief Progress Note Patient Name: CARLIE CORPUS DOB: April 02, 1946 MRN: [id number]   Date of Service  10/20/2019  HPI/Events of Note  Patient transferred from [facility] ED with septic shock, acute respiratory failure on the ventilator, acute kidney injury, and Afib with controlled VRR.  eICU Interventions  New Patient Evaluation completed, PCCM bedside asked to see patient and admit him.        Thomasene Lot Cayson Kalb 10/20/2019, 1:54 AM

## 2019-10-20 NOTE — Progress Notes (Signed)
A CVC is currently being placed. RN will draw all ordered labs.

## 2019-10-20 NOTE — H&P (Signed)
NAME:  Jonathan Wise, MRN:  [id number], DOB:  December 12, 1945, LOS: 1 ADMISSION DATE:  10/19/2019, CONSULTATION DATE:  10/20/19 REFERRING MD:  EDP, CHIEF COMPLAINT:  Sepsis   Brief History   74 y.o. M with PMH atrial fibrillation, DM, pulmonary HTN, bedridden with chronic LE wounds who had a change in mental status on 7/9 and became less interactive.  EMS called to SNF on 7/11 and patient found to be hypoxic and hypotensive.   Treated for sepsis in the ED and intubated, transferred [facility] for ICU admission.  History of present illness   Jonathan Wise is a 74 y.o. M with PMH of Atrial Fibrillation on Eliquis, DVT, HTN, PAH and type 2 DM who presented from SNF with AMS, since 7/9 pt has been less talkative with poor po intake per report.  EMS was called and pt was hypoxic and hypotensive,   In the ED, CXR showed vascular congestion and R pleural effusion, labs significant for lactic acid 5.1, WBC 21.5, k 6.8, creatinine 3.10 and CT head without acute findings.  He was given 6L IVF and broad spectrum antibiotics and required Levophed.  His mental status continued to decline and saturations declined, so patient was intubated and transferred to [facility] for ICU admission.   Past Medical History   has a past medical history of Atrial fibrillation (HCC), Bilateral lower extremity edema, Cellulitis, DVT (deep venous thrombosis) (HCC) (1994), Hyperlipidemia, Hypertension, Kidney stones, Morbid obesity (HCC), Pressure ulcer, Pulmonary artery hypertension (HCC) (10/18/2011), Sleep apnea, Type 2 diabetes mellitus (HCC), Urinary retention (10/15/2011), and Venous stasis ulcers (HCC).   Significant Hospital Events   7/12 Transfer from [facility] to [facility], PCCM admit  Consults:    Procedures:  7/11 ETT 7/12 L IJ CVC  Significant Diagnostic Tests:  7/12 CXR>> Small R pleural effusion 7/11 CT head>>no acute findings  Micro Data:  7/11 UC>> 7/11 BCx2>> 7/11Sars-Cov-2>>negative 7/11 MRSA  screen>>  Antimicrobials:   Cefepime 7/11- Flagyl 7/11- Vancomycin 7/11-  Interim history/subjective:  Pt arrived from [facility] relatively stable on Levophed  Objective   Blood pressure 114/83, pulse 60, temperature (!) 95.9 F (35.5 C), resp. rate (!) 23, height 6\' 1"  (1.854 m), weight 119.7 kg, SpO2 100 %.    Vent Mode: PRVC FiO2 (%):  [60 %-100 %] 60 % Set Rate:  [24 bmp] 24 bmp Vt Set:  [630 mL] 630 mL PEEP:  [5 cmH20] 5 cmH20 Plateau Pressure:  [16 cmH20-17 cmH20] 17 cmH20   Intake/Output Summary (Last 24 hours) at 10/20/2019 0120 Last data filed at 10/20/2019 0058 Gross per 24 hour  Intake 6782.86 ml  Output --  Net 6782.86 ml   Filed Weights   10/19/19 1537  Weight: 119.7 kg   General:  Elderly M, awake, intubated and in no acute distress HEENT: MM pink/moist, ETT in place Neuro: awake, following commands CV: s1s2 rrr, no m/r/g PULM:  Decreased air movement bilateral bases, no rhonchi or rales GI: soft, bsx4 active  Extremities: warm/dry, no edema  Skin: chronic, ruddy LE lesions without warmth or purulent drainage  Resolved Hospital Problem list     Assessment & Plan:   Acute hypoxic respiratory failure R pleural effusion with possible developing infiltrate, respiratory failure likely exacerbated by volume overload, Covid-19 negative  -Continue broad spectrum antibiotics  --Maintain full vent support with SAT/SBT as tolerated -titrate Vent setting to maintain SpO2 greater than or equal to 90%. -HOB elevated 30 degrees. -Plateau pressures less than 30 cm  H20.  -Follow chest x-ray, ABG prn.   -Bronchial hygiene and RT/bronchodilator protocol.   Septic Shock With unclear source currently, possibly developing PNA -UA pending -trend procalcitonin -follow blood and respiratory cultures -Received over 30cc/kg IVF -Continue to trend lactic acid -Levophed to maintain MAP >65   Acute kidney injury with hyperkalemia and NAGMA Minimal UOP ~100cc, likely  secondary to  pre-renal volume depletion with septic shock, creatinine, K 6.1 with repeat BMP pending -follow renal indices -start bicarb gtt with Lasix trial to see if patient will make urine -if still hyperkalemic on repeat BMP give insulin, D50 and calcium -may need nephrology consult   HFrEF with chronic atrial fibrillation  Last Echo 2019 with EF 45-50% -continue Eliquis, repeat Echo is pending -hold Lisinopril, Metoprolol, Verapamil  Type 2 DM -hold home Metformin and Tresiba -SSI, A1c     Best practice:  Diet: NPO Pain/Anxiety/Delirium protocol (if indicated): Fentanyl and Versed VAP protocol (if indicated): HOB 30 degrees, suction prn DVT prophylaxis: Eliquis GI prophylaxis: Pepcid Glucose control: SSI Mobility: bed rest Code Status: Full code Family Communication: unable to reach wife overnight Disposition: ICU  Labs   CBC: Recent Labs  Lab 10/19/19 1610  WBC 21.5*  NEUTROABS 16.5*  HGB 11.8*  HCT 45.3  MCV 94.6  PLT 520*    Basic Metabolic Panel: Recent Labs  Lab 10/19/19 1610 10/19/19 1843  NA 135 136  K 6.8* 6.1*  CL 97* 105  CO2 23 17*  GLUCOSE 96 115*  BUN 46* 45*  CREATININE 3.10* 2.86*  CALCIUM 8.4* 7.5*   GFR: Estimated Creatinine Clearance: 30.7 mL/min (A) (by C-G formula based on SCr of 2.86 mg/dL (H)). Recent Labs  Lab 10/19/19 1610 10/19/19 1612 10/19/19 1843  WBC 21.5*  --   --   LATICACIDVEN  --  5.1* 3.8*    Liver Function Tests: Recent Labs  Lab 10/19/19 1610  AST 54*  ALT 27  ALKPHOS 79  BILITOT 1.1  PROT 6.7  ALBUMIN 3.0*   No results for input(s): LIPASE, AMYLASE in the last 168 hours. No results for input(s): AMMONIA in the last 168 hours.  ABG    Component Value Date/Time   PHART 7.298 (L) 10/19/2019 2207   PCO2ART 37.7 10/19/2019 2207   PO2ART 221 (H) 10/19/2019 2207   HCO3 18.4 (L) 10/19/2019 2207   TCO2 24 11/27/2015 1853   ACIDBASEDEF 7.2 (H) 10/19/2019 2207   O2SAT 99.6 10/19/2019 2207      Coagulation Profile: Recent Labs  Lab 10/19/19 1610  INR 1.6*    Cardiac Enzymes: No results for input(s): CKTOTAL, CKMB, CKMBINDEX, TROPONINI in the last 168 hours.  HbA1C: Hgb A1c MFr Bld  Date/Time Value Ref Range Status  05/13/2019 06:43 PM 5.9 (H) 4.8 - 5.6 % Final    Comment:    (NOTE) Pre diabetes:          5.7%-6.4% Diabetes:              >6.4% Glycemic control for   <7.0% adults with diabetes   07/20/2017 05:13 PM 7.4 (H) 4.8 - 5.6 % Final    Comment:    (NOTE) Pre diabetes:          5.7%-6.4% Diabetes:              >6.4% Glycemic control for   <7.0% adults with diabetes     CBG: Recent Labs  Lab 10/19/19 1618 10/19/19 2337  GLUCAP 88 116*    Review of  Systems:   Unable to obtain secondary to mental status  Past Medical History  He,  has a past medical history of Atrial fibrillation (HCC), Bilateral lower extremity edema, Cellulitis, DVT (deep venous thrombosis) (HCC) (1994), Hyperlipidemia, Hypertension, Kidney stones, Morbid obesity (HCC), Pressure ulcer, Pulmonary artery hypertension (HCC) (10/18/2011), Sleep apnea, Type 2 diabetes mellitus (HCC), Urinary retention (10/15/2011), and Venous stasis ulcers (HCC).   Surgical History    Past Surgical History:  Procedure Laterality Date  . CYSTOSCOPY  10/18/2011   Procedure: CYSTOSCOPY FLEXIBLE;  Surgeon: Ky Barban, MD;  Location: [facility];  Service: Urology;  Laterality: N/A;  . HERNIA REPAIR     Umbilical     Social History   reports that he has quit smoking. He has never used smokeless tobacco. He reports that he does not drink alcohol and does not use drugs.   Family History   His family history includes Diabetes in his mother; Emphysema in his father.   Allergies No Known Allergies   Home Medications  Prior to Admission medications   Medication Sig Start Date End Date Taking? Authorizing Provider  apixaban (ELIQUIS) 5 MG TABS tablet Take 1 tablet (5 mg total) by mouth 2 (two) times  daily. 07/22/17  Yes Hall, Carole N, DO  hydrocerin (EUCERIN) CREA Apply 1 application topically 2 (two) times daily. 05/19/19  Yes Erick Blinks, MD  lisinopril (ZESTRIL) 10 MG tablet Take 10 mg by mouth daily. 10/10/19  Yes [provider]  metFORMIN (GLUCOPHAGE) 500 MG tablet Take 500 mg by mouth 2 (two) times daily with a meal.   Yes [provider]  metoprolol tartrate (LOPRESSOR) 25 MG tablet Take 25 mg by mouth 2 (two) times daily. 07/02/19  Yes [provider]  pravastatin (PRAVACHOL) 40 MG tablet Take 40 mg by mouth daily.   Yes [provider]  TRESIBA FLEXTOUCH 100 UNIT/ML FlexTouch Pen Inject 26 Units into the skin daily. 10/03/19  Yes [provider]  verapamil (CALAN) 120 MG tablet Take 1 tablet by mouth 3 (three) times daily. 07/02/19  Yes [provider]     Critical care time: 65 minutes     CRITICAL CARE Performed by: [name] Blayton Huttner   Total critical care time: 65 minutes  Critical care time was exclusive of separately billable procedures and treating other patients.  Critical care was necessary to treat or prevent imminent or life-threatening deterioration.  Critical care was time spent personally by me on the following activities: development of treatment plan with patient and/or surrogate as well as nursing, discussions with consultants, evaluation of patient's response to treatment, examination of patient, obtaining history from patient or surrogate, ordering and performing treatments and interventions, ordering and review of laboratory studies, ordering and review of radiographic studies, pulse oximetry and re-evaluation of patient's condition.  [name] Aleenah Homen, PA-C

## 2019-10-20 NOTE — Progress Notes (Signed)
RT attempted to wean pt on CPAP/PS 5/5 40%. Pt alert and following commands. Pt pulling good volumes but dozes off to sleep and has decreased RR and MVe. Pt placed back on full support at this time. RT will continue to monitor.

## 2019-10-20 NOTE — H&P (Addendum)
NAME:  Jonathan Wise, MRN:  [id number], DOB:  1946-01-01, LOS: 1 ADMISSION DATE:  10/19/2019, CONSULTATION DATE:  10/20/19 REFERRING MD:  EDP, CHIEF COMPLAINT:  Sepsis   Brief History   74 y.o. M with PMH atrial fibrillation, DM, pulmonary HTN, bedridden with chronic LE wounds who had a change in mental status on 7/9 and became less interactive.  EMS called to SNF on 7/11 and patient found to be hypoxic and hypotensive.   Treated for sepsis in the ED and intubated, transferred [facility] to [facility] for ICU admission.  History of present illness   Jonathan Wise is a 74 y.o. M with PMH of Atrial Fibrillation on Eliquis, DVT, HTN, PAH and type 2 DM who presented from SNF with AMS, since 7/9 pt has been less talkative with poor po intake per report.  EMS was called and pt was hypoxic and hypotensive,   In the ED, CXR showed vascular congestion and R pleural effusion, labs significant for lactic acid 5.1, WBC 21.5, k 6.8, creatinine 3.10 and CT head without acute findings.  He was given 6L IVF and broad spectrum antibiotics and required Levophed.  His mental status continued to decline and saturations declined, so patient was intubated and transferred to [facility] for ICU admission.   Past Medical History   has a past medical history of Atrial fibrillation (HCC), Bilateral lower extremity edema, Cellulitis, DVT (deep venous thrombosis) (HCC) (1994), Hyperlipidemia, Hypertension, Kidney stones, Morbid obesity (HCC), Pressure ulcer, Pulmonary artery hypertension (HCC) (10/18/2011), Sleep apnea, Type 2 diabetes mellitus (HCC), Urinary retention (10/15/2011), and Venous stasis ulcers (HCC).   Significant Hospital Events   7/12 Transfer from [facility] ED to [facility], PCCM admit  Consults:    Procedures:  7/11 ETT 7/12 L IJ CVC  Significant Diagnostic Tests:  7/12 CXR>> Small R pleural effusion 7/11 CT head>>no acute findings  Micro Data:  7/11 UC>> 7/11 BCx2>> 7/11Sars-Cov-2>>negative 7/11 MRSA  screen>>  Antimicrobials:   Cefepime 7/11- Flagyl 7/11- Vancomycin 7/11-  Interim history/subjective:  Pt arrived on unit this am.   Objective   Blood pressure (!) 107/50, pulse (!) 57, temperature (!) 97.5 F (36.4 C), resp. rate 18, height 5\' 10"  (1.778 m), weight (!) 138.3 kg, SpO2 100 %.    Vent Mode: PRVC FiO2 (%):  [40 %-100 %] 40 % Set Rate:  [14 bmp-24 bmp] 14 bmp Vt Set:  [580 mL-630 mL] 580 mL PEEP:  [5 cmH20] 5 cmH20 Plateau Pressure:  [16 cmH20-22 cmH20] 22 cmH20   Intake/Output Summary (Last 24 hours) at 10/20/2019 0744 Last data filed at 10/20/2019 0600 Gross per 24 hour  Intake 7968.24 ml  Output 35 ml  Net 7933.24 ml   Filed Weights   10/19/19 1537 10/20/19 0500  Weight: 119.7 kg (!) 138.3 kg   General:  Elderly M, awake, intubated and in no acute distress HEENT:ETT tube in place, antiicteric sclera  Neuro: awake, following commands CV: irregular pulse, no m/r/g PULM:  BL vent breaths GI: soft, non distended, active bowel sounds   Extremities: warm/dry, no deformities  Skin: chronic,LE lesions, pulses marked with doppler, pulse grade 0 on exam   Resolved Hospital Problem list     Assessment & Plan:   Acute hypoxic respiratory failure  Sepsis from unknown source  R pleural effusion with possible developing infiltrate, respiratory failure likely secondary to possible pneumonia or urinary source. Urine not collected in the ED expect due to AKI and no urine output. Likely exacerbated by volume  overload, Covid-19 negative  -Continue broad spectrum antibiotics  --Maintain full vent support with SAT/SBT as tolerated -titrate Vent setting to maintain SpO2 greater than or equal to 90%. -HOB elevated 30 degrees. -Plateau pressures less than 30 cm H20.  -Follow chest x-ray, ABG prn.   -Bronchial hygiene and RT/bronchodilator protocol.   Septic Shock Received over 30cc/kg IVF in ED. Requiring Levo for BP support. Unclear source currently, possibly  developing PNA. Lactic acid trending down, last one 3.1.  -UA pending -trend procalcitonin -follow blood and respiratory cultures -Continue to trend lactic acid -Levophed to maintain MAP >65   Acute kidney injury with hyperkalemia and NAGMA Minimal UOP , likely secondary to  pre-renal volume depletion with septic shock, creatinine, K  Trending  Down , repeat BMP 5.5. Given lasix since, minimal urine output. May need HD for volume overload.  -follow renal indices -Continue bicarb gtt  - trend K on BMP at noon - consult nephrology   HFrEF with chronic atrial fibrillation  Last Echo 2019 with EF 45-50% -hold  Eliquis, start Heparin in setting of AKI -hold Lisinopril, Metoprolol, Verapamil  Type 2 DM -hold home Metformin and Tresiba -SSI, A1c     Best practice:  Diet: NPO Pain/Anxiety/Delirium protocol (if indicated): Fentanyl and Versed VAP protocol (if indicated): HOB 30 degrees, suction prn DVT prophylaxis: Heparin GI prophylaxis: Pepcid Glucose control: SSI Mobility: bed rest Code Status: Full code Family Communication: unable to reach wife overnight Disposition: ICU  Labs   CBC: Recent Labs  Lab 10/19/19 1610 10/20/19 0408 10/20/19 0538 10/20/19 0632  WBC 21.5* 31.0*  --   --   NEUTROABS 16.5*  --   --   --   HGB 11.8* 11.0* 12.6* 12.6*  HCT 45.3 39.4 37.0* 37.0*  MCV 94.6 86.4  --   --   PLT 520* 474*  --   --     Basic Metabolic Panel: Recent Labs  Lab 10/19/19 1610 10/19/19 1843 10/20/19 0408 10/20/19 0538 10/20/19 0632  NA 135 136 139 140 140  K 6.8* 6.1* 5.5* 5.3* 5.2*  CL 97* 105 105  --   --   CO2 23 17* 17*  --   --   GLUCOSE 96 115* 137*  --   --   BUN 46* 45* 49*  --   --   CREATININE 3.10* 2.86* 3.21*  --   --   CALCIUM 8.4* 7.5* 8.0*  --   --   MG  --   --  1.6*  --   --   PHOS  --   --  5.2*  --   --    GFR: Estimated Creatinine Clearance: 28.3 mL/min (A) (by C-G formula based on SCr of 3.21 mg/dL (H)). Recent Labs  Lab  10/19/19 1610 10/19/19 1612 10/19/19 1843 10/20/19 0408  PROCALCITON  --   --   --  2.80  WBC 21.5*  --   --  31.0*  LATICACIDVEN  --  5.1* 3.8* 3.1*    Liver Function Tests: Recent Labs  Lab 10/19/19 1610  AST 54*  ALT 27  ALKPHOS 79  BILITOT 1.1  PROT 6.7  ALBUMIN 3.0*   No results for input(s): LIPASE, AMYLASE in the last 168 hours. No results for input(s): AMMONIA in the last 168 hours.  ABG    Component Value Date/Time   PHART 7.470 (H) 10/20/2019 4098   PCO2ART 26.7 (L) 10/20/2019 0632   PO2ART 138 (H) 10/20/2019 1191   HCO3  19.7 (L) 10/20/2019 0632   TCO2 21 (L) 10/20/2019 0632   ACIDBASEDEF 3.0 (H) 10/20/2019 0632   O2SAT 99.0 10/20/2019 0632     Coagulation Profile: Recent Labs  Lab 10/19/19 1610  INR 1.6*    Cardiac Enzymes: No results for input(s): CKTOTAL, CKMB, CKMBINDEX, TROPONINI in the last 168 hours.  HbA1C: Hgb A1c MFr Bld  Date/Time Value Ref Range Status  05/13/2019 06:43 PM 5.9 (H) 4.8 - 5.6 % Final    Comment:    (NOTE) Pre diabetes:          5.7%-6.4% Diabetes:              >6.4% Glycemic control for   <7.0% adults with diabetes   07/20/2017 05:13 PM 7.4 (H) 4.8 - 5.6 % Final    Comment:    (NOTE) Pre diabetes:          5.7%-6.4% Diabetes:              >6.4% Glycemic control for   <7.0% adults with diabetes     CBG: Recent Labs  Lab 10/19/19 1618 10/19/19 2337 10/20/19 0138 10/20/19 0733  GLUCAP 88 116* 134* 140*    Review of Systems:   Unable to obtain secondary to mental status  Past Medical History  He,  has a past medical history of Atrial fibrillation (HCC), Bilateral lower extremity edema, Cellulitis, DVT (deep venous thrombosis) (HCC) (1994), Hyperlipidemia, Hypertension, Kidney stones, Morbid obesity (HCC), Pressure ulcer, Pulmonary artery hypertension (HCC) (10/18/2011), Sleep apnea, Type 2 diabetes mellitus (HCC), Urinary retention (10/15/2011), and Venous stasis ulcers (HCC).   Surgical History    Past  Surgical History:  Procedure Laterality Date  . CYSTOSCOPY  10/18/2011   Procedure: CYSTOSCOPY FLEXIBLE;  Surgeon: Ky BarbanMohammad I Javaid, MD;  Location: [facility];  Service: Urology;  Laterality: N/A;  . HERNIA REPAIR     Umbilical     Social History   reports that he has quit smoking. He has never used smokeless tobacco. He reports that he does not drink alcohol and does not use drugs.   Family History   His family history includes Diabetes in his mother; Emphysema in his father.   Allergies No Known Allergies   Home Medications  Prior to Admission medications   Medication Sig Start Date End Date Taking? Authorizing Provider  apixaban (ELIQUIS) 5 MG TABS tablet Take 1 tablet (5 mg total) by mouth 2 (two) times daily. 07/22/17  Yes Hall, Carole N, DO  hydrocerin (EUCERIN) CREA Apply 1 application topically 2 (two) times daily. 05/19/19  Yes Erick BlinksMemon, Jehanzeb, MD  lisinopril (ZESTRIL) 10 MG tablet Take 10 mg by mouth daily. 10/10/19  Yes [provider]  metFORMIN (GLUCOPHAGE) 500 MG tablet Take 500 mg by mouth 2 (two) times daily with a meal.   Yes [provider]  metoprolol tartrate (LOPRESSOR) 25 MG tablet Take 25 mg by mouth 2 (two) times daily. 07/02/19  Yes [provider]  pravastatin (PRAVACHOL) 40 MG tablet Take 40 mg by mouth daily.   Yes [provider]  TRESIBA FLEXTOUCH 100 UNIT/ML FlexTouch Pen Inject 26 Units into the skin daily. 10/03/19  Yes [provider]  verapamil (CALAN) 120 MG tablet Take 1 tablet by mouth 3 (three) times daily. 07/02/19  Yes [provider]     Critical care time: 65 minutes    Thurmon FairJeff Steen, MD PGY2    PCCM:  74 yo M, PMH afib, DMII, chronic leg wounds, was at  home, altered mental status at home, hypotensive and hypothermic. Intubated and brought to ICU, septic shock on Nepi.   BP (!) 107/50   Pulse (!) 57   Temp (!) 97.5 F (36.4 C)   Resp 18   Ht 5\' 10"  (1.778 m)   Wt (!) 138.3 kg   SpO2 100%    BMI 43.75 kg/m  Gen: elderly male, intubated, on life support, MV, chronically ill appearing HENT: ETT in place  Heart: RRR, S1 S2  Lungs: BL mechanically vented breaths  Abd: soft, nt nd   Labs: Reviewed  WBC elevated  LA coming down  PCT 2.8  A: Septic Shock,  Presumed UTI  Possible RLL PNA, infiltrate and small effusion  Chronic systolic heart failure EF 45-50%  Low albumin  Lactic acidosis, Respiratory compensated   Elevated PCT  AHRF on MV 2/2 above   P: Continue broad spectrum abx  Needs UA collected  Follow up BCx  Remains on heating blanket  Titrate NEPi to maintain MAP >80mmHG Start vasopressin if needed  Follow UOP Continue Bicarb infusion  May need to consult nephrology  He was DNR prior per documentation   Family coming to hospital for further discussion and GOCs  This patient is critically ill with multiple organ system failure; which, requires frequent high complexity decision making, assessment, support, evaluation, and titration of therapies. This was completed through the application of advanced monitoring technologies and extensive interpretation of multiple databases. During this encounter critical care time was devoted to patient care services described in this note for 55 minutes.  77m, DO [facility] 10/20/2019 9:01 AM

## 2019-10-20 NOTE — Progress Notes (Signed)
Pts QTc 0.5 this AM, relayed to CCM MD and pharmacy, plan to monitor for now.

## 2019-10-21 ENCOUNTER — Inpatient Hospital Stay (HOSPITAL_COMMUNITY): Payer: Medicare Other

## 2019-10-21 DIAGNOSIS — R6521 Severe sepsis with septic shock: Secondary | ICD-10-CM

## 2019-10-21 LAB — BASIC METABOLIC PANEL
Anion gap: 14 (ref 5–15)
Anion gap: 11 (ref 5–15)
BUN: 45 mg/dL — ABNORMAL HIGH (ref 8–23)
BUN: 42 mg/dL — ABNORMAL HIGH (ref 8–23)
CO2: 25 mmol/L (ref 22–32)
CO2: 27 mmol/L (ref 22–32)
Calcium: 7.9 mg/dL — ABNORMAL LOW (ref 8.9–10.3)
Calcium: 7.7 mg/dL — ABNORMAL LOW (ref 8.9–10.3)
Chloride: 103 mmol/L (ref 98–111)
Chloride: 101 mmol/L (ref 98–111)
Creatinine, Ser: 2.66 mg/dL — ABNORMAL HIGH (ref 0.61–1.24)
Creatinine, Ser: 2.15 mg/dL — ABNORMAL HIGH (ref 0.61–1.24)
GFR calc Af Amer: 26 mL/min — ABNORMAL LOW (ref >60)
GFR calc Af Amer: 34 mL/min — ABNORMAL LOW (ref >60)
GFR calc non Af Amer: 23 mL/min — ABNORMAL LOW (ref >60)
GFR calc non Af Amer: 29 mL/min — ABNORMAL LOW (ref >60)
Glucose, Bld: 137 mg/dL — ABNORMAL HIGH (ref 70–99)
Glucose, Bld: 171 mg/dL — ABNORMAL HIGH (ref 70–99)
Potassium: 4 mmol/L (ref 3.5–5.1)
Potassium: 3.6 mmol/L (ref 3.5–5.1)
Sodium: 142 mmol/L (ref 135–145)
Sodium: 139 mmol/L (ref 135–145)

## 2019-10-21 LAB — MAGNESIUM
Magnesium: 1.8 mg/dL (ref 1.7–2.4)
Magnesium: 2 mg/dL (ref 1.7–2.4)
Magnesium: 1.9 mg/dL (ref 1.7–2.4)

## 2019-10-21 LAB — CBC
HCT: 34.2 % — ABNORMAL LOW (ref 39.0–52.0)
Hemoglobin: 10.2 g/dL — ABNORMAL LOW (ref 13.0–17.0)
MCH: 24.2 pg — ABNORMAL LOW (ref 26.0–34.0)
MCHC: 29.8 g/dL — ABNORMAL LOW (ref 30.0–36.0)
MCV: 81.2 fL (ref 80.0–100.0)
Platelets: 373 10*3/uL (ref 150–400)
RBC: 4.21 MIL/uL — ABNORMAL LOW (ref 4.22–5.81)
RDW: 16.8 % — ABNORMAL HIGH (ref 11.5–15.5)
WBC: 20.2 10*3/uL — ABNORMAL HIGH (ref 4.0–10.5)
nRBC: 0 % (ref 0.0–0.2)

## 2019-10-21 LAB — GLUCOSE, CAPILLARY
Glucose-Capillary: 142 mg/dL — ABNORMAL HIGH (ref 70–99)
Glucose-Capillary: 132 mg/dL — ABNORMAL HIGH (ref 70–99)
Glucose-Capillary: 144 mg/dL — ABNORMAL HIGH (ref 70–99)
Glucose-Capillary: 148 mg/dL — ABNORMAL HIGH (ref 70–99)
Glucose-Capillary: 141 mg/dL — ABNORMAL HIGH (ref 70–99)
Glucose-Capillary: 161 mg/dL — ABNORMAL HIGH (ref 70–99)

## 2019-10-21 LAB — APTT
aPTT: >200 seconds (ref 24–36)
aPTT: 110 seconds — ABNORMAL HIGH (ref 24–36)
aPTT: 85 seconds — ABNORMAL HIGH (ref 24–36)
aPTT: 64 seconds — ABNORMAL HIGH (ref 24–36)

## 2019-10-21 LAB — PROCALCITONIN: Procalcitonin: 2.96 ng/mL

## 2019-10-21 LAB — VANCOMYCIN, RANDOM: Vancomycin Rm: 15

## 2019-10-21 LAB — PHOSPHORUS
Phosphorus: 2.5 mg/dL (ref 2.5–4.6)
Phosphorus: 1.9 mg/dL — ABNORMAL LOW (ref 2.5–4.6)

## 2019-10-21 LAB — HEPARIN LEVEL (UNFRACTIONATED): Heparin Unfractionated: >2.2 IU/mL — ABNORMAL HIGH (ref 0.30–0.70)

## 2019-10-21 MED ORDER — PROSOURCE TF PO LIQD
45.0000 mL | Freq: Three times a day (TID) | ORAL | Status: DC
  Administered 2019-10-21 (×2): 45 mL
  Filled 2019-10-21 (×6): qty 45

## 2019-10-21 MED ORDER — METOPROLOL TARTRATE 5 MG/5ML IV SOLN
INTRAVENOUS | Status: AC
  Filled 2019-10-21: qty 5

## 2019-10-21 MED ORDER — POLYETHYLENE GLYCOL 3350 17 G PO PACK
17.0000 g | PACK | Freq: Every day | ORAL | Status: DC
  Administered 2019-10-23: 17 g
  Filled 2019-10-21: qty 1

## 2019-10-21 MED ORDER — FUROSEMIDE 10 MG/ML IJ SOLN
80.0000 mg | Freq: Two times a day (BID) | INTRAMUSCULAR | Status: DC
  Administered 2019-10-22 – 2019-10-23 (×3): 80 mg via INTRAVENOUS
  Filled 2019-10-21 (×3): qty 8

## 2019-10-21 MED ORDER — FUROSEMIDE 10 MG/ML IJ SOLN
80.0000 mg | Freq: Once | INTRAMUSCULAR | Status: AC
  Administered 2019-10-21: 80 mg via INTRAVENOUS
  Filled 2019-10-21: qty 8

## 2019-10-21 MED ORDER — PROSOURCE TF PO LIQD
45.0000 mL | Freq: Two times a day (BID) | ORAL | Status: DC
  Administered 2019-10-21: 45 mL
  Filled 2019-10-21 (×2): qty 45

## 2019-10-21 MED ORDER — SODIUM CHLORIDE 0.9% FLUSH
10.0000 mL | Freq: Two times a day (BID) | INTRAVENOUS | Status: DC
  Administered 2019-10-21 – 2019-11-03 (×25): 10 mL

## 2019-10-21 MED ORDER — METOPROLOL TARTRATE 12.5 MG HALF TABLET: 12.5000 mg | ORAL_TABLET | Freq: Three times a day (TID) | ORAL | Status: DC

## 2019-10-21 MED ORDER — METOPROLOL TARTRATE 12.5 MG HALF TABLET
12.5000 mg | ORAL_TABLET | Freq: Two times a day (BID) | ORAL | Status: DC
  Administered 2019-10-21: 12.5 mg
  Filled 2019-10-21: qty 1

## 2019-10-21 MED ORDER — VITAL HIGH PROTEIN PO LIQD
1000.0000 mL | ORAL | Status: DC
  Administered 2019-10-21: 1000 mL

## 2019-10-21 MED ORDER — METOPROLOL TARTRATE 5 MG/5ML IV SOLN
5.0000 mg | INTRAVENOUS | Status: DC | PRN
  Administered 2019-10-21 – 2019-10-24 (×3): 5 mg via INTRAVENOUS
  Filled 2019-10-21 (×2): qty 5

## 2019-10-21 MED ORDER — VITAL HIGH PROTEIN PO LIQD
1000.0000 mL | ORAL | Status: DC
  Administered 2019-10-21 – 2019-10-22 (×2): 1000 mL

## 2019-10-21 MED ORDER — POTASSIUM CHLORIDE 20 MEQ/15ML (10%) PO SOLN
40.0000 meq | Freq: Once | ORAL | Status: AC
  Administered 2019-10-21: 40 meq via ORAL
  Filled 2019-10-21: qty 30

## 2019-10-21 MED ORDER — SODIUM CHLORIDE 0.9% FLUSH: 10.0000 mL | INTRAVENOUS | Status: DC | PRN

## 2019-10-21 MED ORDER — MAGNESIUM SULFATE 2 GM/50ML IV SOLN
2.0000 g | Freq: Once | INTRAVENOUS | Status: AC
  Administered 2019-10-21: 2 g via INTRAVENOUS
  Filled 2019-10-21: qty 50

## 2019-10-21 MED ORDER — DOCUSATE SODIUM 50 MG/5ML PO LIQD
100.0000 mg | Freq: Two times a day (BID) | ORAL | Status: DC
  Administered 2019-10-21 – 2019-10-23 (×3): 100 mg
  Filled 2019-10-21 (×3): qty 10

## 2019-10-21 MED ORDER — METOPROLOL TARTRATE 12.5 MG HALF TABLET: 12.5000 mg | ORAL_TABLET | Freq: Two times a day (BID) | ORAL | Status: DC

## 2019-10-21 MED ORDER — SODIUM CHLORIDE 0.9 % IV SOLN
2.0000 g | INTRAVENOUS | Status: DC
  Filled 2019-10-21: qty 2

## 2019-10-21 NOTE — Progress Notes (Signed)
SBT CPAP/PS performed with pt this morning. Pt on 10/5 30%. Pt tolerated for one hour. Due to decreased RR/MVe pt returned to full support. RT will attempt at a later time. Will continue to monitor.

## 2019-10-21 NOTE — Progress Notes (Signed)
ANTICOAGULATION CONSULT NOTE - Follow-Up Consult  Pharmacy Consult for Heparin Indication: atrial fibrillation (while Apixaban on hold)  No Known Allergies  Patient Measurements: Height: 5\' 10"  (177.8 cm) Weight: (!) 140.1 kg (308 lb 13.8 oz) IBW/kg (Calculated) : 73 Heparin Dosing Weight: 105 kg  Vital Signs: Temp: 97.7 F (36.5 C) (07/13 0600) Temp Source: Core (07/13 0500) BP: 104/54 (07/13 0600) Pulse Rate: 93 (07/13 0600)  Labs: Recent Labs    10/19/19 1610 10/19/19 1610 10/19/19 1843 10/20/19 0408 10/20/19 0408 10/20/19 0538 10/20/19 0538 10/20/19 12/21/19 10/20/19 1034 10/20/19 1034 10/20/19 1237 10/20/19 2228 10/21/19 0317 10/21/19 0504  HGB 11.8*  --    < > 11.0*   < > 12.6*   < > 12.6*  --   --   --   --  10.2*  --   HCT 45.3  --    < > 39.4   < > 37.0*  --  37.0*  --   --   --   --  34.2*  --   PLT 520*  --   --  474*  --   --   --   --   --   --   --   --  373  --   APTT 31   < >  --   --   --   --   --   --  30   < >  --  77* >200* 110*  LABPROT 18.2*  --   --   --   --   --   --   --   --   --   --   --   --   --   INR 1.6*  --   --   --   --   --   --   --   --   --   --   --   --   --   HEPARINUNFRC  --   --   --   --   --   --   --   --  1.50*  --   --   --  >2.20*  --   CREATININE 3.10*  --    < > 3.21*  --   --   --   --   --   --  3.12*  --  2.66*  --    < > = values in this interval not displayed.    Estimated Creatinine Clearance: 34.4 mL/min (A) (by C-G formula based on SCr of 2.66 mg/dL (H)).   Assessment: 59 YOM who presented who 7/11 with AMS, AKI, and concern for sepsis. The patient was on apixaban PTA for hx Afib - the plan is to bridge with Heparin while awaiting AKI resolution.   The patient's last dose of Apixaban was on 7/11 AM.  Baseline HL 1.5 - and not as high as expected given risk for accumulation with AKI, baseline aPTT 30.   aPTT this afternoon is therapeutic after a rate decrease earlier today (aPTT 85 << 110, goal of  66-102). Hgb/Hct slight drop, plts wnl. No bleeding noted per discussion with RN.   Goal of Therapy:  Heparin level 0.3-0.7 units/ml aPTT 66-102 seconds Monitor platelets by anticoagulation protocol: Yes   Plan:  - Continue Heparin at 1300 units/hr - Will continue to monitor for any signs/symptoms of bleeding and will follow up with aPTT level in 8 hours to confirm therapeutic  Thank  you for allowing pharmacy to be a part of this patient's care.  Georgina Pillion, PharmD, BCPS Clinical Pharmacist Clinical phone for 10/21/2019: [phone] 10/21/2019 9:01 AM   **Pharmacist phone directory can now be found on [email or web address] ([email or web address]).  Listed under [facility].

## 2019-10-21 NOTE — Progress Notes (Addendum)
NAME:  Jonathan Wise, MRN:  [id number], DOB:  [birth year], LOS: 2 ADMISSION DATE:  10/19/2019, CONSULTATION DATE:  10/21/19 REFERRING MD:  EDP, CHIEF COMPLAINT:  Sepsis   Brief History   74 y.o. M with PMH atrial fibrillation, DM, pulmonary HTN, bedridden with chronic LE wounds who had a change in mental status on 7/9 and became less interactive.  EMS called to SNF on 7/11 and patient found to be hypoxic and hypotensive.   Treated for sepsis in the ED and intubated, transferred [facility] for ICU admission.  History of present illness   Jonathan Wise is a 74 y.o. M with PMH of Atrial Fibrillation on Eliquis, DVT, HTN, PAH and type 2 DM who presented from SNF with AMS, since 7/9 pt has been less talkative with poor po intake per report.  EMS was called and pt was hypoxic and hypotensive,   In the ED, CXR showed vascular congestion and R pleural effusion, labs significant for lactic acid 5.1, WBC 21.5, k 6.8, creatinine 3.10 and CT head without acute findings.  He was given 6L IVF and broad spectrum antibiotics and required Levophed.  His mental status continued to decline and saturations declined, so patient was intubated and transferred to [facility] for ICU admission.   Past Medical History   has a past medical history of Atrial fibrillation (HCC), Bilateral lower extremity edema, Cellulitis, DVT (deep venous thrombosis) (HCC) (1994), Hyperlipidemia, Hypertension, Kidney stones, Morbid obesity (HCC), Pressure ulcer, Pulmonary artery hypertension (HCC) (10/18/2011), Sleep apnea, Type 2 diabetes mellitus (HCC), Urinary retention (10/15/2011), and Venous stasis ulcers (HCC).   Significant Hospital Events   7/12 Transfer from [facility] to [facility], PCCM admit  Consults:    Procedures:  7/11 ETT 7/12 L IJ CVC  Significant Diagnostic Tests:  7/12 CXR>> Small R pleural effusion 7/11 CT head>>no acute findings  Micro Data:  7/11 UC>> 7/11 BCx2>> 7/11Sars-Cov-2>>negative 7/11 MRSA  screen>>  Antimicrobials:   Cefepime 7/11- Flagyl 7/11-7/13 Vancomycin 7/11-7/13  Interim history/subjective:   Afib rvr this morning. Now rate controlled. Alert on mechanical vent   Objective   Blood pressure (!) 104/54, pulse 93, temperature 97.7 F (36.5 C), resp. rate 14, height 5\' 10"  (1.778 m), weight (!) 140.1 kg, SpO2 98 %.    Vent Mode: PRVC FiO2 (%):  [40 %] 40 % Set Rate:  [14 bmp] 14 bmp Vt Set:  [580 mL] 580 mL PEEP:  [5 cmH20] 5 cmH20 Plateau Pressure:  [13 cmH20-14 cmH20] 14 cmH20   Intake/Output Summary (Last 24 hours) at 10/21/2019 10/23/2019 Last data filed at 10/21/2019 0600 Gross per 24 hour  Intake 4371.89 ml  Output 2180 ml  Net 2191.89 ml   Filed Weights   10/19/19 1537 10/20/19 0500 10/21/19 0331  Weight: 119.7 kg (!) 138.3 kg (!) 140.1 kg   General:  Elderly M, awake, intubated and in no acute distress HEENT:ETT tube in place, antiicteric sclera  Neuro: awake, following commands CV: irregular pulse, no m/r/g PULM:  BL vent breaths GI: soft, non distended, active bowel sounds   Extremities: warm/dry, no deformities  Skin: chronic LE lesions, warm   Notes on labs reviewed: K 4.9 , BUN 34, Cr.92, Albumin 2.5 , Hgb 9.9  Resolved Hospital Problem list     Assessment & Plan:   Acute hypoxic respiratory failure  Sepsis from unknown source  R pleural effusion with possible developing infiltrate, respiratory failure likely secondary to possible pneumonia or urinary source. Patient is not  having urinary symptoms now and UA is does not suggest UTI, however patient was already started on Abx before making urine.   -LA was trended down  Yesterday. Plan:  - Will stop metronidazole and Vancomycin today. - Continue Cefepime  - Start tube feeds   - Maintain full vent support with SAT/SBT as tolerated - titrate Vent setting to maintain SpO2 greater than or equal to 90%. - HOB elevated 30 degrees. - Plateau pressures less than 30 cm H20.  - Follow chest  x-ray, ABG prn.   - Bronchial hygiene and RT/bronchodilator protocol. - Levophed to maintain MAP >65, wean as tolerated  Acute kidney injury Bicarb improved with drip. Cr improving and good urine output overnight.  Hypervolemic on exam, will give lasix and check labs this afternoon.  Plan: - follow renal indices - Stop bicarb gtt  - Lasix   HFrEF with chronic atrial fibrillation  Last Echo 2019 with EF 45-50% Plan: -hold  Eliquis, start Heparin in setting of AKI -hold Lisinopril - on Metoprolol tartrate 25 mg BID at home , Verapamil 120 mg - Start Metoprolol 12.5 mg BID today  -PRN Lopressor for RVR  Type 2 DM -hold home Metformin and Tresiba -SSI      Best practice:  Diet: NPO Pain/Anxiety/Delirium protocol (if indicated): Fentanyl and Versed VAP protocol (if indicated): HOB 30 degrees, suction prn DVT prophylaxis: Heparin GI prophylaxis: Pepcid Glucose control: SSI Mobility: bed rest Code Status: Full code Family Communication: unable to reach wife overnight Disposition: ICU   Thurmon Fair, MD PGY2   PCCM:  74 yo, septic shock, likely urine source, UA was collected very late, BCX NGTD, +PCT, LA on admission and hypothermic. Now on low dose NEPI and making uring. SCR better   Was in Afib RVR breifly this AM  BP (!) 130/57   Pulse 61   Temp (!) 97.5 F (36.4 C)   Resp 12   Ht 5\' 10"  (1.778 m)   Wt (!) 140.1 kg   SpO2 97%   BMI 44.32 kg/m   Gen: elderly male, on vent  HENT: ETT in place  Heart: irregular, s1 s2  Lungs: BL vented breaths  Ext: BL UE and LE edema   Labs reviewed: Scr betting better  A:  Septic shock, slowly improving  AHRF on MV  +CFB Ac on Ch diastolic heart failure  Volume overloaded s/p resuscitation  AKI, ARF Afib RVR   P: De-escalate abx to cefepime x 7 days  Start metoprolol for rate control  Weaned off pressors  Diuresis started  Follow UOP  Follow lytes  Wound care for legs  Not ready to come off vent until  euvolemic  Want to give him his best shot   This patient is critically ill with multiple organ system failure; which, requires frequent high complexity decision making, assessment, support, evaluation, and titration of therapies. This was completed through the application of advanced monitoring technologies and extensive interpretation of multiple databases. During this encounter critical care time was devoted to patient care services described in this note for 34 minutes.  , DO [facility] 10/21/2019 10:16 AM

## 2019-10-21 NOTE — Progress Notes (Signed)
Pharmacy Antibiotic Note  Jonathan Wise is a 74 y.o. male admitted on 10/19/2019 with concern for sepsis. Pharmacy has been consulted for Vancomycin + Cefepime dosing.  A Vancomycin random level this morning was 15 mcg/ml and at an appropriate level for re-dosing however per discussion with CCM - will d/c today. AKI now resolving with SCr down to 2.66 << 3.21 (BL 0.4-0.6), UOP up to 0.6 ml/kg/hr. Will adjust Cefepime dose today.   Plan: - D/c Vancomycin - Adjust Cefepime to 2g IV every 24 hours - re-dose this evening for a total of 3g today with rapidly improving renal function and elevated weight - Further adjustments based on trends in SCr - Per CCM discussion with family, does not want to pursue dialysis options - Will continue to follow renal function, culture results, LOT, and antibiotic de-escalation plans   Height: 5\' 10"  (177.8 cm) Weight: (!) 140.1 kg (308 lb 13.8 oz) IBW/kg (Calculated) : 73  Temp (24hrs), Avg:98.3 F (36.8 C), Min:97.3 F (36.3 C), Max:98.8 F (37.1 C)  Recent Labs  Lab 10/19/19 1610 10/19/19 1612 10/19/19 1843 10/20/19 0408 10/20/19 1200 10/20/19 1237 10/20/19 1607 10/21/19 0317  WBC 21.5*  --   --  31.0*  --   --   --  20.2*  CREATININE 3.10*  --  2.86* 3.21*  --  3.12*  --  2.66*  LATICACIDVEN  --  5.1* 3.8* 3.1* 1.4  --  1.2  --   VANCORANDOM  --   --   --   --   --   --   --  15    Estimated Creatinine Clearance: 34.4 mL/min (A) (by C-G formula based on SCr of 2.66 mg/dL (H)).    No Known Allergies  Antimicrobials this admission: Vanc 7/11 >> Cefepime 7/11 >>  Dose adjustments this admission:  Microbiology results: 7/11 BCx >> ng<24h 7/11 UCx >> pending 7/11 RCx >> pending 7/11 COVID >> neg 7/12 MRSA PCR >> positive  Thank you for allowing pharmacy to be a part of this patient's care.  9/12, PharmD, BCPS Clinical Pharmacist Clinical phone for 10/21/2019: 10/23/2019 10/21/2019 7:14 AM   **Pharmacist phone directory  can now be found on [email or web address] ([email or web address]).  Listed under [facility].

## 2019-10-21 NOTE — Progress Notes (Signed)
Nutrition Follow-up  DOCUMENTATION CODES:   Morbid obesity  INTERVENTION:   Begin TF via OG tube: - Vital High Protein @ 60 ml/hr (1440 ml/day) - ProSource TF 45 ml TID  Above tube feeding regimen provides 1560 kcal, 159 grams of protein, and 1204 ml of H2O.  NUTRITION DIAGNOSIS:   Inadequate oral intake related to inability to eat as evidenced by NPO status.  Ongoing  GOAL:   Provide needs based on ASPEN/SCCM guidelines   Progressing  MONITOR:   Vent status, Labs, Weight trends, Skin, I & O's  REASON FOR ASSESSMENT:   Consult Enteral/tube feeding initiation and management  ASSESSMENT:   74 year old male who presented on 7/11 with AMS. PMH of atrial fibrillation, T2DM, DVT, HTN, HLD, bedbound with chronic LE wounds. Pt admitted with AKI, hyperkalemia, septic shock and required intubation due to acute hypoxic respiratory failure.   Discussed patient in ICU rounds and with RN today. Received MD Consult for TF initiation and management. OG tube in place, with tip in the stomach per today's film.  Admission weight was 119.7 kg (BMI=37.9). Will use this as EDW. Pt with mild pitting generalized edema and moderate pitting edema to BUE and BLE per RN documentation. Currently 140.1 kg.  Patient is currently intubated on ventilator support MV: 9.6 L/min Temp (24hrs), Avg:98.1 F (36.7 C), Min:97.2 F (36.2 C), Max:98.8 F (37.1 C) BP (a-line): 116/56 MAP (a-line): 75  Drips: Fentanyl: 5 ml/hr Levophed: 11.2 ml/hr Sodium bicarb: 100 ml/hr  Medications reviewed and include colace, Lasix, Novolog SSI q 4 hours, miralax, IV abx, IV pepcid.  Labs reviewed: BUN 45, creatinine 2.66 CBG's: 132-144  I/O's: +10.1 L since admit   Nutrition Focused Physical Exam:  Subcutaneous Fat:  Orbital Region: no depletion Upper Arm Region: no depletion Thoracic and Lumbar Region: no depletion Buccal Region: no depletion  Muscle:  Temple Region: mild depletion Clavicle Bone  Region: no depletion Clavicle and Acromion Bone Region: no depletion Scapular Bone Region: no depletion Dorsal Hand: no depletion Patellar Region: no depletion Anterior Thigh Region: no depletion Posterior Calf Region: no depletion  Edema: mild   Diet Order:   Diet Order            Diet NPO time specified  Diet effective now                 EDUCATION NEEDS:   No education needs have been identified at this time  Skin:  Skin Assessment: Skin Integrity Issues: Unstageable: left buttock Other: cellulitis to BLE, MASD to abd/groin  Last BM:  10/20/19  Height:   Ht Readings from Last 1 Encounters:  10/20/19 5\' 10"  (1.778 m)    Weight:   Wt Readings from Last 1 Encounters:  10/21/19 (!) 140.1 kg    Ideal Body Weight:  75.5 kg  BMI:  Body mass index is 44.32 kg/m.  Estimated Nutritional Needs:   Kcal:  10/23/19  Protein:  151-185 grams  Fluid:  1.4-1.6 L   [id number], RD, LDN, CNSC Please refer to Amion for contact information.

## 2019-10-21 NOTE — Progress Notes (Signed)
ANTICOAGULATION CONSULT NOTE - Follow Up Consult  Pharmacy Consult for heparin Indication: atrial fibrillation  Labs: Recent Labs    10/19/19 1610 10/19/19 1610 10/19/19 1843 10/20/19 0408 10/20/19 0408 10/20/19 0538 10/20/19 0538 10/20/19 4098 10/20/19 1034 10/20/19 1034 10/20/19 1237 10/20/19 2228 10/21/19 0317 10/21/19 0504  HGB 11.8*  --    < > 11.0*   < > 12.6*   < > 12.6*  --   --   --   --  10.2*  --   HCT 45.3  --    < > 39.4   < > 37.0*  --  37.0*  --   --   --   --  34.2*  --   PLT 520*  --   --  474*  --   --   --   --   --   --   --   --  373  --   APTT 31   < >  --   --   --   --   --   --  30   < >  --  77* >200* 110*  LABPROT 18.2*  --   --   --   --   --   --   --   --   --   --   --   --   --   INR 1.6*  --   --   --   --   --   --   --   --   --   --   --   --   --   HEPARINUNFRC  --   --   --   --   --   --   --   --  1.50*  --   --   --  >2.20*  --   CREATININE 3.10*  --    < > 3.21*  --   --   --   --   --   --  3.12*  --  2.66*  --    < > = values in this interval not displayed.    Assessment/Plan:  74yo male on heparin for afib while eliquis on hold.  Am aPTT reported as >200sec, redraw 110 sec Will decrease heparin to 1300 units/hr.  Check aPTT 8 hours after rate change  Talbert Cage, PharmD 10/21/2019,6:02 AM

## 2019-10-21 NOTE — Progress Notes (Signed)
ANTICOAGULATION CONSULT NOTE  Pharmacy Consult for Heparin Indication: atrial fibrillation (while Apixaban on hold)  No Known Allergies  Patient Measurements: Height: 5\' 10"  (177.8 cm) Weight: (!) 140.1 kg (308 lb 13.8 oz) IBW/kg (Calculated) : 73 Heparin Dosing Weight: 105 kg  Vital Signs: Temp: 98.2 F (36.8 C) (07/13 2100) Temp Source: Bladder (07/13 2000) BP: 130/54 (07/13 2154) Pulse Rate: 133 (07/13 2154)  Labs: Recent Labs    10/19/19 1610 10/19/19 1610 10/19/19 1843 10/20/19 0408 10/20/19 0408 10/20/19 0538 10/20/19 0538 10/20/19 12/21/19 10/20/19 1034 10/20/19 1237 10/20/19 2228 10/21/19 0317 10/21/19 0317 10/21/19 0504 10/21/19 1423 10/21/19 2123  HGB 11.8*  --    < > 11.0*   < > 12.6*   < > 12.6*  --   --   --  10.2*  --   --   --   --   HCT 45.3  --    < > 39.4   < > 37.0*  --  37.0*  --   --   --  34.2*  --   --   --   --   PLT 520*  --   --  474*  --   --   --   --   --   --   --  373  --   --   --   --   APTT 31   < >  --   --   --   --   --   --  30  --    < > >200*   < > 110* 85* 64*  LABPROT 18.2*  --   --   --   --   --   --   --   --   --   --   --   --   --   --   --   INR 1.6*  --   --   --   --   --   --   --   --   --   --   --   --   --   --   --   HEPARINUNFRC  --   --   --   --   --   --   --   --  1.50*  --   --  >2.20*  --   --   --   --   CREATININE 3.10*  --    < > 3.21*   < >  --   --   --   --  3.12*  --  2.66*  --   --  2.15*  --    < > = values in this interval not displayed.    Estimated Creatinine Clearance: 42.6 mL/min (A) (by C-G formula based on SCr of 2.15 mg/dL (H)).   Assessment: 63 YOM who presented who 7/11 with AMS, AKI, and concern for sepsis. The patient was on apixaban PTA for hx Afib - the plan is to bridge with Heparin while awaiting AKI resolution.   aPTT is slightly sub-therapeutic this evening.  No issue with heparin infusion nor bleeding per RN.  Goal of Therapy:  Heparin level 0.3-0.7 units/ml aPTT 66-102  seconds Monitor platelets by anticoagulation protocol: Yes   Plan:  Increase heparin gtt to 1350 units/hr Check 8 hr aPTT  Akul Leggette D. 05-17-1983, PharmD, BCPS, BCCCP 10/21/2019, 10:04 PM

## 2019-10-22 DIAGNOSIS — I482 Chronic atrial fibrillation, unspecified: Secondary | ICD-10-CM

## 2019-10-22 LAB — GLUCOSE, CAPILLARY
Glucose-Capillary: 168 mg/dL — ABNORMAL HIGH (ref 70–99)
Glucose-Capillary: 167 mg/dL — ABNORMAL HIGH (ref 70–99)
Glucose-Capillary: 183 mg/dL — ABNORMAL HIGH (ref 70–99)
Glucose-Capillary: 140 mg/dL — ABNORMAL HIGH (ref 70–99)
Glucose-Capillary: 149 mg/dL — ABNORMAL HIGH (ref 70–99)

## 2019-10-22 LAB — APTT: aPTT: 78 seconds — ABNORMAL HIGH (ref 24–36)

## 2019-10-22 LAB — BASIC METABOLIC PANEL
Anion gap: 11 (ref 5–15)
Anion gap: 9 (ref 5–15)
BUN: 44 mg/dL — ABNORMAL HIGH (ref 8–23)
BUN: 40 mg/dL — ABNORMAL HIGH (ref 8–23)
CO2: 28 mmol/L (ref 22–32)
CO2: 33 mmol/L — ABNORMAL HIGH (ref 22–32)
Calcium: 7.9 mg/dL — ABNORMAL LOW (ref 8.9–10.3)
Calcium: 8 mg/dL — ABNORMAL LOW (ref 8.9–10.3)
Chloride: 101 mmol/L (ref 98–111)
Chloride: 99 mmol/L (ref 98–111)
Creatinine, Ser: 1.66 mg/dL — ABNORMAL HIGH (ref 0.61–1.24)
Creatinine, Ser: 1.15 mg/dL (ref 0.61–1.24)
GFR calc Af Amer: 46 mL/min — ABNORMAL LOW (ref >60)
GFR calc Af Amer: >60 mL/min (ref >60)
GFR calc non Af Amer: 40 mL/min — ABNORMAL LOW (ref >60)
GFR calc non Af Amer: >60 mL/min (ref >60)
Glucose, Bld: 191 mg/dL — ABNORMAL HIGH (ref 70–99)
Glucose, Bld: 179 mg/dL — ABNORMAL HIGH (ref 70–99)
Potassium: 3.5 mmol/L (ref 3.5–5.1)
Potassium: 3.6 mmol/L (ref 3.5–5.1)
Sodium: 140 mmol/L (ref 135–145)
Sodium: 141 mmol/L (ref 135–145)

## 2019-10-22 LAB — HEPARIN LEVEL (UNFRACTIONATED): Heparin Unfractionated: 0.96 IU/mL — ABNORMAL HIGH (ref 0.30–0.70)

## 2019-10-22 LAB — CBC
HCT: 33.4 % — ABNORMAL LOW (ref 39.0–52.0)
Hemoglobin: 9.7 g/dL — ABNORMAL LOW (ref 13.0–17.0)
MCH: 24 pg — ABNORMAL LOW (ref 26.0–34.0)
MCHC: 29 g/dL — ABNORMAL LOW (ref 30.0–36.0)
MCV: 82.7 fL (ref 80.0–100.0)
Platelets: 333 10*3/uL (ref 150–400)
RBC: 4.04 MIL/uL — ABNORMAL LOW (ref 4.22–5.81)
RDW: 17.2 % — ABNORMAL HIGH (ref 11.5–15.5)
WBC: 17.8 10*3/uL — ABNORMAL HIGH (ref 4.0–10.5)
nRBC: 0 % (ref 0.0–0.2)

## 2019-10-22 LAB — PROCALCITONIN: Procalcitonin: 1.83 ng/mL

## 2019-10-22 LAB — MAGNESIUM
Magnesium: 1.9 mg/dL (ref 1.7–2.4)
Magnesium: 1.7 mg/dL (ref 1.7–2.4)

## 2019-10-22 LAB — PHOSPHORUS
Phosphorus: 2 mg/dL — ABNORMAL LOW (ref 2.5–4.6)
Phosphorus: 3.9 mg/dL (ref 2.5–4.6)

## 2019-10-22 MED ORDER — ORAL CARE MOUTH RINSE
15.0000 mL | Freq: Two times a day (BID) | OROMUCOSAL | Status: DC
  Administered 2019-10-22 – 2019-11-04 (×16): 15 mL via OROMUCOSAL

## 2019-10-22 MED ORDER — METOLAZONE 5 MG PO TABS
5.0000 mg | ORAL_TABLET | Freq: Once | ORAL | Status: DC
  Filled 2019-10-22: qty 1

## 2019-10-22 MED ORDER — SODIUM CHLORIDE 0.9 % IV SOLN
2.0000 g | Freq: Three times a day (TID) | INTRAVENOUS | Status: AC
  Administered 2019-10-22 – 2019-10-25 (×11): 2 g via INTRAVENOUS
  Filled 2019-10-22 (×12): qty 2

## 2019-10-22 MED ORDER — METOLAZONE 5 MG PO TABS
5.0000 mg | ORAL_TABLET | Freq: Once | ORAL | Status: AC
  Administered 2019-10-22: 5 mg
  Filled 2019-10-22: qty 1

## 2019-10-22 MED ORDER — METOPROLOL TARTRATE 12.5 MG HALF TABLET
12.5000 mg | ORAL_TABLET | Freq: Three times a day (TID) | ORAL | Status: DC
  Administered 2019-10-22 – 2019-10-23 (×4): 12.5 mg
  Filled 2019-10-22 (×4): qty 1

## 2019-10-22 MED ORDER — SODIUM PHOSPHATES 45 MMOLE/15ML IV SOLN
20.0000 mmol | Freq: Once | INTRAVENOUS | Status: AC
  Administered 2019-10-22: 20 mmol via INTRAVENOUS
  Filled 2019-10-22: qty 6.67

## 2019-10-22 MED ORDER — POTASSIUM CHLORIDE 20 MEQ/15ML (10%) PO SOLN
40.0000 meq | Freq: Once | ORAL | Status: AC
  Administered 2019-10-22: 40 meq via ORAL
  Filled 2019-10-22: qty 30

## 2019-10-22 NOTE — Progress Notes (Signed)
Pt with complaint of pain to left side of mouth when ETT moved to that side. Will try to avoid that area if able. RT will continue to monitor.

## 2019-10-22 NOTE — Progress Notes (Signed)
RT attempted SBT on CPAP/PS 10/5 30%. Pt awakens when spoken to and follows commands. Upon placing pt on wean, pt able to pull appropriate volumes, but dozes off to sleep. When pt dozes off to sleep his MVe and RR decrease. Pt returned to full support at this time. RT will continue to monitor.

## 2019-10-22 NOTE — Progress Notes (Signed)
Pharmacy Antibiotic Note  DELOS KLICH is a 74 y.o. male admitted on 10/19/2019 with concern for sepsis possibly 2/2 PNA or urinary source. Pharmacy has been consulted for Cefepime dosing.  Afebrile, WBC trending down. AKI rapidly improving, SCR 1.66 (2.15 prior). Expect renal function to continue trending back to baseline (~0.4-0.6). Will adjust Cefepime dose to reflect kidney function.   Plan: - Adjust Cefepime to 2g IV every 8 hours - Will continue to follow renal function, culture results, antibiotic de-escalation and extubation plans.  Height: 5\' 10"  (177.8 cm) Weight: (!) 139.6 kg (307 lb 12.2 oz) IBW/kg (Calculated) : 73  Temp (24hrs), Avg:98.1 F (36.7 C), Min:97.2 F (36.2 C), Max:99.1 F (37.3 C)  Recent Labs  Lab 10/19/19 1610 10/19/19 1610 10/19/19 1612 10/19/19 1843 10/19/19 1843 10/20/19 0408 10/20/19 1200 10/20/19 1237 10/20/19 1607 10/21/19 0317 10/21/19 1423 10/22/19 0302  WBC 21.5*  --   --   --   --  31.0*  --   --   --  20.2*  --  17.8*  CREATININE 3.10*   < >  --  2.86*   < > 3.21*  --  3.12*  --  2.66* 2.15* 1.66*  LATICACIDVEN  --   --  5.1* 3.8*  --  3.1* 1.4  --  1.2  --   --   --   VANCORANDOM  --   --   --   --   --   --   --   --   --  15  --   --    < > = values in this interval not displayed.    Estimated Creatinine Clearance: 55 mL/min (A) (by C-G formula based on SCr of 1.66 mg/dL (H)).    No Known Allergies  Antimicrobials this admission: Vanc 7/11 >> 7/13 Cefepime 7/11 >> (7/17)  Microbiology results: 7/11 BCx >> ngtd 7/12 MRSA PCR >> positive   9/12, PharmD PGY1 Acute Care Pharmacy Resident Please refer to [facility] for unit-specific pharmacist

## 2019-10-22 NOTE — Progress Notes (Signed)
ANTICOAGULATION CONSULT NOTE  Pharmacy Consult for Heparin Indication: atrial fibrillation (while Apixaban on hold)  No Known Allergies  Patient Measurements: Height: 5\' 10"  (177.8 cm) Weight: (!) 139.6 kg (307 lb 12.2 oz) IBW/kg (Calculated) : 73 Heparin Dosing Weight: 105 kg  Vital Signs: Temp: 98.2 F (36.8 C) (07/14 0900) Temp Source: Oral (07/14 0709) BP: 102/71 (07/14 0900) Pulse Rate: 127 (07/14 0900)  Labs: Recent Labs    10/19/19 1610 10/19/19 1610 10/19/19 1843 10/20/19 0408 10/20/19 0538 10/20/19 12/21/19 10/20/19 1034 10/20/19 1237 10/21/19 0317 10/21/19 0504 10/21/19 1423 10/21/19 2123 10/22/19 0302 10/22/19 0303 10/22/19 0627  HGB 11.8*   < >   < > 11.0*   < > 12.6*  --   --  10.2*  --   --   --  9.7*  --   --   HCT 45.3  --    < > 39.4   < > 37.0*  --   --  34.2*  --   --   --  33.4*  --   --   PLT 520*  --    < > 474*  --   --   --   --  373  --   --   --  333  --   --   APTT 31   < >  --   --   --   --  30   < > >200*   < > 85* 64*  --   --  78*  LABPROT 18.2*  --   --   --   --   --   --   --   --   --   --   --   --   --   --   INR 1.6*  --   --   --   --   --   --   --   --   --   --   --   --   --   --   HEPARINUNFRC  --   --   --   --   --   --  1.50*  --  >2.20*  --   --   --   --  0.96*  --   CREATININE 3.10*  --    < > 3.21*  --   --   --    < > 2.66*  --  2.15*  --  1.66*  --   --    < > = values in this interval not displayed.    Estimated Creatinine Clearance: 55 mL/min (A) (by C-G formula based on SCr of 1.66 mg/dL (H)).   Assessment: 73 YOM who presented who 7/11 with AMS, AKI, and concern for sepsis. The patient was on apixaban PTA for hx Afib - the plan is to bridge with Heparin while awaiting AKI resolution.   Hgb slightly trending down, PLT stable. No s/sx of bleeding. APTT therapeutic, HL improving but not correlated yet (due to Eliquis). AKI improving. Possibly restart Eliquis when extubated and taking PO meds.  Goal of Therapy:   Heparin level 0.3-0.7 units/ml aPTT 66-102 seconds Monitor platelets by anticoagulation protocol: Yes   Plan:  Continue heparin gtt at 1350 units/hr Monitor daily aPTT/HL, CBC and s/sx of bleeding Follow up potential transition to Eliquis when appropriate  9/11, PharmD PGY1 Acute Care Pharmacy Resident Please refer to [facility] for unit-specific pharmacist

## 2019-10-22 NOTE — Progress Notes (Addendum)
NAME:  Jonathan Wise, MRN:  [id number], DOB:  12-31-1945, LOS: 3 ADMISSION DATE:  10/19/2019, CONSULTATION DATE:  10/22/19 REFERRING MD:  EDP, CHIEF COMPLAINT:  Sepsis   Brief History   74 y.o. M with PMH atrial fibrillation, DM, pulmonary HTN, bedridden with chronic LE wounds who had a change in mental status on 7/9 and became less interactive.  EMS called to SNF on 7/11 and patient found to be hypoxic and hypotensive.   Treated for sepsis in the ED and intubated, transferred [facility] to [facility] for ICU admission.  History of present illness   Jonathan Wise is a 74 y.o. M with PMH of Atrial Fibrillation on Eliquis, DVT, HTN, PAH and type 2 DM who presented from SNF with AMS, since 7/9 pt has been less talkative with poor po intake per report.  EMS was called and pt was hypoxic and hypotensive,   In the ED, CXR showed vascular congestion and R pleural effusion, labs significant for lactic acid 5.1, WBC 21.5, k 6.8, creatinine 3.10 and CT head without acute findings.  He was given 6L IVF and broad spectrum antibiotics and required Levophed.  His mental status continued to decline and saturations declined, so patient was intubated and transferred to [facility] for ICU admission.   Past Medical History   has a past medical history of Atrial fibrillation (HCC), Bilateral lower extremity edema, Cellulitis, DVT (deep venous thrombosis) (HCC) (1994), Hyperlipidemia, Hypertension, Kidney stones, Morbid obesity (HCC), Pressure ulcer, Pulmonary artery hypertension (HCC) (10/18/2011), Sleep apnea, Type 2 diabetes mellitus (HCC), Urinary retention (10/15/2011), and Venous stasis ulcers (HCC).   Significant Hospital Events   7/12 Transfer from [facility] to [facility], PCCM admit  Consults:    Procedures:  7/11 ETT 7/12 L IJ CVC  Significant Diagnostic Tests:  7/12 CXR>> Small R pleural effusion 7/11 CT head>>no acute findings  Micro Data:  7/11 UC>> 7/11 BCx2>> 7/11Sars-Cov-2>>negative 7/11 MRSA  screen>>  Antimicrobials:   Cefepime 7/11- Flagyl 7/11-7/13 Vancomycin 7/11-7/13  Interim history/subjective:   Given PRN lopressor overnight for Afib with RVR.   Objective   Blood pressure 111/60, pulse 74, temperature 98.6 F (37 C), resp. rate 14, height 5\' 10"  (1.778 m), weight (!) 139.6 kg, SpO2 99 %.    Vent Mode: PRVC FiO2 (%):  [30 %] 30 % Set Rate:  [14 bmp] 14 bmp Vt Set:  [580 mL] 580 mL PEEP:  [5 cmH20] 5 cmH20 Pressure Support:  [10 cmH20] 10 cmH20 Plateau Pressure:  [14 cmH20-15 cmH20] 15 cmH20   Intake/Output Summary (Last 24 hours) at 10/22/2019 0704 Last data filed at 10/22/2019 0600 Gross per 24 hour  Intake 2780 ml  Output 5575 ml  Net -2795 ml   Filed Weights   10/20/19 0500 10/21/19 0331 10/22/19 0310  Weight: (!) 138.3 kg (!) 140.1 kg (!) 139.6 kg   General:  Elderly M, awake, intubated and in no acute distress HEENT:ETT tube in place, antiicteric sclera  Neuro: awake, following commands CV: irregular pulse, no m/r/g PULM:  BL vent breaths GI: soft, non distended, active bowel sounds   Extremities: warm/dry, no deformities  Skin: chronic LE lesions, warm   Notes on labs reviewed: K 3.5 Mg 1.9 Phos 2 Cr 1.61 WBC 17.8  Resolved Hospital Problem list     Assessment & Plan:   Acute hypoxic respiratory failure  Sepsis from unknown source  Likely early CAP or UTI. Right pleural effusion on admission xray and possible infiltrate. UA did not  suggest UTI, however patient was already started on Abx before making urine.   - Antibiotic narrowed 7/13 to Cefepime only - WBC continues to trend down , Afebrile  Plan:  - Continue Cefepime for 7 days, stop date 7/17 - Continue tube feeds  - Maintain full vent support with SAT/SBT as tolerated  - titrate Vent setting to maintain SpO2 greater than or equal to 90%. - HOB elevated 30 degrees. - Plateau pressures less than 30 cm H20.  - Follow chest x-ray, ABG prn.   - Bronchial hygiene and RT/bronchodilator  protocol. - Levo restarted, try to wean today  Acute kidney injury Baseline Cr nl , looking at care everywhere records. Cr trending down, 1.61. Good diuresis yesterday with lasix. Will continue diuresis.  Plan: - follow renal indices - Lasix 80 BID  HFrEF with chronic atrial fibrillation  Last Echo 7/12,poor study. EF 55-60%.  - Patient in RVR overnight. He hasn't weaned completely off levo as of this morning. Will need titration of afib medications as sepsis improves.  - on Metoprolol tartrate 25 mg BID at home , Verapamil 120 mg Plan: - Continue Heparin in setting of AKI (improving)  - hold Lisinopril - Continue Metoprolol 12.5 mg BID today  - PRN Lopressor for RVR  Type 2 DM Review CBG, at hospital goal -hold home Metformin and Tresiba -SSI   Best practice:  Diet: Tube feeds  Pain/Anxiety/Delirium protocol (if indicated): Fentanyl and Versed VAP protocol (if indicated): HOB 30 degrees, suction prn DVT prophylaxis: Heparin GI prophylaxis: Pepcid Glucose control: SSI Mobility: bed rest Code Status: Full code Family Communication: Resident will update family , unless bedside today Disposition: ICU   Jonathan Fair, MD PGY2    PCCM:  74 yo admitted for septic shock, now off pressors, afib rvr, AKI nowing improving.   5.5 L UOP overnight  BP 102/71   Pulse (!) 127   Temp 98.2 F (36.8 C)   Resp 20   Ht 5\' 10"  (1.778 m)   Wt (!) 139.6 kg   SpO2 96%   BMI 44.16 kg/m   Gen: elderly m, comfortable on vent  Hent: tracking NCAT  Heart: s1 s2 irregular  Lungs: clear vented breaths   Labs reviewed: SCR 1.6  A:  Septic shock, resolved, presumed UTI  AKI, improving  AHRF on MV  Afi RVR Acute diastolic heart failure  P: Hold sedation  Plan for liberation  Continue diuresis  Remove aline Keep foley  Heparin per pharmacy  Wean off NEPI   This patient is critically ill with multiple organ system failure; which, requires frequent high complexity decision  making, assessment, support, evaluation, and titration of therapies. This was completed through the application of advanced monitoring technologies and extensive interpretation of multiple databases. During this encounter critical care time was devoted to patient care services described in this note for 32 minutes.   , DO [facility] 10/22/2019 9:34 AM

## 2019-10-23 LAB — CBC
HCT: 34.6 % — ABNORMAL LOW (ref 39.0–52.0)
Hemoglobin: 9.6 g/dL — ABNORMAL LOW (ref 13.0–17.0)
MCH: 24.4 pg — ABNORMAL LOW (ref 26.0–34.0)
MCHC: 27.7 g/dL — ABNORMAL LOW (ref 30.0–36.0)
MCV: 87.8 fL (ref 80.0–100.0)
Platelets: 253 10*3/uL (ref 150–400)
RBC: 3.94 MIL/uL — ABNORMAL LOW (ref 4.22–5.81)
RDW: 17.1 % — ABNORMAL HIGH (ref 11.5–15.5)
WBC: 12.7 10*3/uL — ABNORMAL HIGH (ref 4.0–10.5)
nRBC: 0 % (ref 0.0–0.2)

## 2019-10-23 LAB — GLUCOSE, CAPILLARY
Glucose-Capillary: 101 mg/dL — ABNORMAL HIGH (ref 70–99)
Glucose-Capillary: 104 mg/dL — ABNORMAL HIGH (ref 70–99)
Glucose-Capillary: 105 mg/dL — ABNORMAL HIGH (ref 70–99)
Glucose-Capillary: 110 mg/dL — ABNORMAL HIGH (ref 70–99)
Glucose-Capillary: 132 mg/dL — ABNORMAL HIGH (ref 70–99)
Glucose-Capillary: 134 mg/dL — ABNORMAL HIGH (ref 70–99)
Glucose-Capillary: 96 mg/dL (ref 70–99)

## 2019-10-23 LAB — BASIC METABOLIC PANEL
Anion gap: 10 (ref 5–15)
Anion gap: 11 (ref 5–15)
BUN: 36 mg/dL — ABNORMAL HIGH (ref 8–23)
BUN: 36 mg/dL — ABNORMAL HIGH (ref 8–23)
CO2: 35 mmol/L — ABNORMAL HIGH (ref 22–32)
CO2: 34 mmol/L — ABNORMAL HIGH (ref 22–32)
Calcium: 8.3 mg/dL — ABNORMAL LOW (ref 8.9–10.3)
Calcium: 8.7 mg/dL — ABNORMAL LOW (ref 8.9–10.3)
Chloride: 98 mmol/L (ref 98–111)
Chloride: 96 mmol/L — ABNORMAL LOW (ref 98–111)
Creatinine, Ser: 1.1 mg/dL (ref 0.61–1.24)
Creatinine, Ser: 0.84 mg/dL (ref 0.61–1.24)
GFR calc Af Amer: >60 mL/min (ref >60)
GFR calc Af Amer: >60 mL/min (ref >60)
GFR calc non Af Amer: >60 mL/min (ref >60)
GFR calc non Af Amer: >60 mL/min (ref >60)
Glucose, Bld: 112 mg/dL — ABNORMAL HIGH (ref 70–99)
Glucose, Bld: 123 mg/dL — ABNORMAL HIGH (ref 70–99)
Potassium: 3.5 mmol/L (ref 3.5–5.1)
Potassium: 4 mmol/L (ref 3.5–5.1)
Sodium: 143 mmol/L (ref 135–145)
Sodium: 141 mmol/L (ref 135–145)

## 2019-10-23 LAB — HEPARIN LEVEL (UNFRACTIONATED): Heparin Unfractionated: 0.9 IU/mL — ABNORMAL HIGH (ref 0.30–0.70)

## 2019-10-23 LAB — APTT: aPTT: 96 seconds — ABNORMAL HIGH (ref 24–36)

## 2019-10-23 MED ORDER — METOPROLOL TARTRATE 25 MG PO TABS
25.0000 mg | ORAL_TABLET | Freq: Three times a day (TID) | ORAL | Status: DC
  Administered 2019-10-23 – 2019-10-29 (×20): 25 mg via ORAL
  Filled 2019-10-23: qty 2
  Filled 2019-10-23 (×19): qty 1

## 2019-10-23 MED ORDER — APIXABAN 5 MG PO TABS
5.0000 mg | ORAL_TABLET | Freq: Two times a day (BID) | ORAL | Status: DC
  Administered 2019-10-23 – 2019-11-04 (×25): 5 mg via ORAL
  Filled 2019-10-23 (×25): qty 1

## 2019-10-23 MED ORDER — ENSURE MAX PROTEIN PO LIQD
11.0000 [oz_av] | Freq: Two times a day (BID) | ORAL | Status: DC
  Administered 2019-10-23 – 2019-11-03 (×16): 11 [oz_av] via ORAL
  Filled 2019-10-23 (×26): qty 330

## 2019-10-23 MED ORDER — POTASSIUM CHLORIDE 10 MEQ/50ML IV SOLN
10.0000 meq | INTRAVENOUS | Status: AC
  Administered 2019-10-23 (×5): 10 meq via INTRAVENOUS
  Filled 2019-10-23 (×5): qty 50

## 2019-10-23 MED ORDER — DOCUSATE SODIUM 100 MG PO CAPS
100.0000 mg | ORAL_CAPSULE | Freq: Two times a day (BID) | ORAL | Status: DC
  Administered 2019-10-23 – 2019-11-01 (×11): 100 mg via ORAL
  Filled 2019-10-23 (×22): qty 1

## 2019-10-23 MED ORDER — METOPROLOL TARTRATE 12.5 MG HALF TABLET: 25.0000 mg | ORAL_TABLET | Freq: Three times a day (TID) | ORAL | Status: DC

## 2019-10-23 MED ORDER — POLYETHYLENE GLYCOL 3350 17 G PO PACK
17.0000 g | PACK | Freq: Every day | ORAL | Status: DC
  Administered 2019-10-24: 17 g via ORAL
  Filled 2019-10-23 (×2): qty 1

## 2019-10-23 MED ORDER — MAGNESIUM SULFATE 2 GM/50ML IV SOLN
2.0000 g | Freq: Once | INTRAVENOUS | Status: AC
  Administered 2019-10-23: 2 g via INTRAVENOUS
  Filled 2019-10-23: qty 50

## 2019-10-23 MED ORDER — FUROSEMIDE 10 MG/ML IJ SOLN
40.0000 mg | Freq: Two times a day (BID) | INTRAMUSCULAR | Status: DC
  Administered 2019-10-23 – 2019-10-24 (×2): 40 mg via INTRAVENOUS
  Filled 2019-10-23 (×2): qty 4

## 2019-10-23 MED FILL — Fentanyl Citrate Preservative Free (PF) Inj 100 MCG/2ML: INTRAMUSCULAR | Qty: 2 | Status: AC

## 2019-10-23 MED FILL — Ketamine HCl Soln Pref Syr 100 MG/10ML (10 MG/ML): INTRAMUSCULAR | Qty: 20 | Status: AC

## 2019-10-23 NOTE — Progress Notes (Signed)
Assisted tele visit to patient with daughter.  Jennesis Ramaswamy M Tinisha Etzkorn, RN   

## 2019-10-23 NOTE — Progress Notes (Addendum)
NAME:  Jonathan Wise, MRN:  [id number], DOB:  [birth year], LOS: 4 ADMISSION DATE:  10/19/2019, CONSULTATION DATE:  10/23/19 REFERRING MD:  EDP, CHIEF COMPLAINT:  Sepsis   Brief History   74 y.o. M with PMH atrial fibrillation, DM, pulmonary HTN, bedridden with chronic LE wounds who had a change in mental status on 7/9 and became less interactive.  EMS called to SNF on 7/11 and patient found to be hypoxic and hypotensive.   Treated for sepsis in the ED and intubated, transferred [facility] to [facility] for ICU admission.  History of present illness   Jonathan Wise is a 74 y.o. M with PMH of Atrial Fibrillation on Eliquis, DVT, HTN, PAH and type 2 DM who presented from SNF with AMS, since 7/9 pt has been less talkative with poor po intake per report.  EMS was called and pt was hypoxic and hypotensive,   In the ED, CXR showed vascular congestion and R pleural effusion, labs significant for lactic acid 5.1, WBC 21.5, k 6.8, creatinine 3.10 and CT head without acute findings.  He was given 6L IVF and broad spectrum antibiotics and required Levophed.  His mental status continued to decline and saturations declined, so patient was intubated and transferred to [facility] for ICU admission.   Past Medical History   has a past medical history of Atrial fibrillation (HCC), Bilateral lower extremity edema, Cellulitis, DVT (deep venous thrombosis) (HCC) (1994), Hyperlipidemia, Hypertension, Kidney stones, Morbid obesity (HCC), Pressure ulcer, Pulmonary artery hypertension (HCC) (10/18/2011), Sleep apnea, Type 2 diabetes mellitus (HCC), Urinary retention (10/15/2011), and Venous stasis ulcers (HCC).   Significant Hospital Events   7/12 Transfer from [facility] to [facility], PCCM admit  Consults:    Procedures:  7/11 ETT 7/12 L IJ CVC  Significant Diagnostic Tests:  7/12 CXR>> Small R pleural effusion 7/11 CT head>>no acute findings  Micro Data:  7/11 UC>>negative UA  7/11 BCx2>> 7/11Sars-Cov-2>>negative 7/11 MRSA  screen>>positive  Antimicrobials:   Cefepime 7/11- Flagyl 7/11-7/13 Vancomycin 7/11-7/13  Interim history/subjective:  Extubated yesterday. Confusion reported by overnight RN. Patient says he has had some pain in abdomen with eating for the past month. Also would like his toenails cut.  Objective   Blood pressure 110/70, pulse (!) 119, temperature 97.6 F (36.4 C), temperature source Oral, resp. rate 11, height 5\' 10"  (1.778 m), weight (!) 140.2 kg, SpO2 97 %.    Vent Mode: PRVC FiO2 (%):  [30 %] 30 % Set Rate:  [14 bmp] 14 bmp Vt Set:  [580 mL] 580 mL PEEP:  [5 cmH20] 5 cmH20 Plateau Pressure:  [15 cmH20] 15 cmH20   Intake/Output Summary (Last 24 hours) at 10/23/2019 0706 Last data filed at 10/23/2019 0500 Gross per 24 hour  Intake 1720.01 ml  Output 4085 ml  Net -2364.99 ml   Filed Weights   10/21/19 0331 10/22/19 0310 10/23/19 0438  Weight: (!) 140.1 kg (!) 139.6 kg (!) 140.2 kg   General:  Elderly M, appears chronically ill HEENT: EOMI, PERRL, Neuro: awake, following commands CV: irregular pulse, no m/r/g PULM:  CTAB, no wheezes, rhonchi, rales GI: soft, non distended, active bowel sounds   Extremities: warm/dry, no deformities  Skin: chronic LE lesions, warm   Notes on labs reviewed: K 3.5 CO2 35 Na 135, Mg 1.7, Phos 3.9 Cr 1.10 WBC 17.8 > 12.7  Resolved Hospital Problem list   Acute hypoxic respiratory failure   Assessment & Plan:   Sepsis from unknown source  Likely early  CAP or UTI. Right pleural effusion on admission xray and possible infiltrate. UA did not suggest UTI, however patient was already started on Abx before making urine.  Patient says he was having a hard time urinating and this started 2 weeks prior to admission. Currently has a foley catheter, will need to be monitored closely  when foley is removed.  - Antibiotic narrowed 7/13 to Cefepime only - WBC continues to trend down , Afebrile  Plan:  - Continue Cefepime for 7 days, stop date 7/17 -  PT/OT -Remove foley, place condom cath - Monitor urine output   Acute kidney injury (improving)  Baseline Cr nl ~.6, looking at care everywhere records. Cr trending down, wnl. Good diuresis yesterday with lasix, out 4 L. Contraction alkalosis on labs. Plan: - follow renal indices - cut lasix back to 40 mg BID   HFrEF with chronic atrial fibrillation  Last Echo 7/12,poor study. EF 55-60%.  - on Metoprolol tartrate 25 mg BID at home , Verapamil 120 mg Plan: - Continue Heparin in setting of AKI (improving)  - hold Lisinopril - Increease Metoprolol today to 12.5 mg TID today  - PRN Lopressor for RVR  Type 2 DM Review CBG, at hospital goal -hold home Metformin and Tresiba -SSI   Best practice:  Diet: Soft Pain/Anxiety/Delirium protocol (if indicated): VAP protocol (if indicated):  DVT prophylaxis: Heparin GI prophylaxis: Pepcid Glucose control: SSI Mobility: up with assistance Code Status: Full code Family Communication: Resident will update family , unless bedside today Disposition: ICU   Thurmon Fair, MD PGY2     PCCM:  74 yo septic shock, presumed urine source, was placed on pressors, intubated, now extubated, fluid over loaded and now diuresed   Occasional afib with RVR, prn metoprolol and scheduled   BP 102/68 (BP Location: Left Arm)   Pulse (!) 119   Temp 97.6 F (36.4 C) (Oral)   Resp 20   Ht 5\' 10"  (1.778 m)   Wt (!) 140.2 kg   SpO2 99%   BMI 44.35 kg/m   Gen: chronically ill appearing male, resting in bed  Heart: ireg ireg, s1 s2  Lungs: diminsihed in the bases, poor effort, no wheeze Ext: BL LE edema, BL chronic stasis wounds   Labs reviewed: SCR improved   A:  Septic shock, resolved  Acute on chronic diastolic heart failure, volume overloaded  Afib RVR on AC  AKI, resolving   P: Continue diuresis  Decrease lasix  Increased metoprolol 25mg  TID  Complete 7 days abx  PT/OT today  Up in chair if possible Stable for transfer from the ICU     , DO [facility] 10/23/2019 10:02 AM

## 2019-10-23 NOTE — Progress Notes (Signed)
Patient discussed with Dr.Adhikari. [facility] will take over care at 7am on 7/16. Transfer order placed.

## 2019-10-23 NOTE — Discharge Instructions (Signed)

## 2019-10-23 NOTE — Progress Notes (Signed)
°   10/23/19 1834  Assess: MEWS Score  BP 100/64  Pulse Rate (!) 106  ECG Heart Rate (!) 108  Resp 18  SpO2 100 %  Assess: MEWS Score  MEWS Temp 0  MEWS Systolic 1  MEWS Pulse 1  MEWS RR 0  MEWS LOC 0  MEWS Score 2  MEWS Score Color Yellow  Assess: if the MEWS score is Yellow or Red  Were vital signs taken at a resting state? Yes  Focused Assessment Documented focused assessment  Early Detection of Sepsis Score *See Row Information* Medium  MEWS guidelines implemented *See Row Information* Yes  Treat  MEWS Interventions Escalated (See documentation below)  Take Vital Signs  Increase Vital Sign Frequency  Yellow: Q 2hr X 2 then Q 4hr X 2, if remains yellow, continue Q 4hrs  Notify: Charge Nurse/RN  Name of Charge Nurse/RN Notified Clement Sayres, RN  Date Charge Nurse/RN Notified 10/23/19  Time Charge Nurse/RN Notified 1634  Pt resting HR is between 90's and 110's MDs are aware of this

## 2019-10-23 NOTE — Progress Notes (Signed)
Nutrition Follow-up  DOCUMENTATION CODES:   Morbid obesity  INTERVENTION:   - Ensure Max po BID, each supplement provides 150 kcal and 30 grams of protein  NUTRITION DIAGNOSIS:   Inadequate oral intake related to inability to eat as evidenced by NPO status.  Progressing, pt now on soft dieet  GOAL:   Patient will meet greater than or equal to 90% of their needs  Progressing  MONITOR:   PO intake, Supplement acceptance, Labs, Weight trends, Skin, I & O's  REASON FOR ASSESSMENT:   Consult Enteral/tube feeding initiation and management  ASSESSMENT:   74 year old male who presented on 7/11 with AMS. PMH of atrial fibrillation, T2DM, DVT, HTN, HLD, bedbound with chronic LE wounds. Pt admitted with AKI, hyperkalemia, septic shock and required intubation due to acute hypoxic respiratory failure.  7/14 - extubated, diet advanced to soft  Per notes, septic shock resolved and AKI improving. Pt was fluid overloaded and now diuresed.  No meal completions documented since diet advanced. RD will order Ensure Max oral nutrition supplements to aid pt in meeting kcal and protein needs.  Pt continues to have moderate pitting generalized edema and moderate pitting edema to BUE and BLE per RN edema assessment. EDW 119.7.  Medications reviewed and include: colace, Lasix, SSI, miralax, IV abx, IV KCl 10 mEq x 5  Labs reviewed: hemoglobin 9.6 CBG's: 96-183 x 24 hours  UOP: 4085 ml x 24 hours I/O's: +5.4 L since admit  Diet Order:   Diet Order            DIET SOFT Room service appropriate? Yes; Fluid consistency: Thin  Diet effective now                 EDUCATION NEEDS:   No education needs have been identified at this time  Skin:  Skin Assessment: Skin Integrity Issues: Unstageable: left buttock Other: cellulitis to BLE; MASD to abd/groin  Last BM:  10/22/19  Height:   Ht Readings from Last 1 Encounters:  10/20/19 5\' 10"  (1.778 m)    Weight:   Wt Readings from  Last 1 Encounters:  10/23/19 (!) 140.2 kg    Ideal Body Weight:  75.5 kg  BMI:  Body mass index is 44.35 kg/m.  Estimated Nutritional Needs:   Kcal:  2100-2300  Protein:  110-130 grams  Fluid:  >/= 2.0 L    10/25/19, MS, RD, LDN Inpatient Clinical Dietitian Please see [facility] for contact information.

## 2019-10-24 DIAGNOSIS — I82409 Acute embolism and thrombosis of unspecified deep veins of unspecified lower extremity: Secondary | ICD-10-CM

## 2019-10-24 DIAGNOSIS — E119 Type 2 diabetes mellitus without complications: Secondary | ICD-10-CM

## 2019-10-24 DIAGNOSIS — J181 Lobar pneumonia, unspecified organism: Secondary | ICD-10-CM

## 2019-10-24 DIAGNOSIS — N390 Urinary tract infection, site not specified: Secondary | ICD-10-CM

## 2019-10-24 DIAGNOSIS — R6521 Severe sepsis with septic shock: Secondary | ICD-10-CM

## 2019-10-24 DIAGNOSIS — Z794 Long term (current) use of insulin: Secondary | ICD-10-CM

## 2019-10-24 DIAGNOSIS — J9601 Acute respiratory failure with hypoxia: Secondary | ICD-10-CM

## 2019-10-24 LAB — CULTURE, BLOOD (ROUTINE X 2)
Culture: NO GROWTH
Culture: NO GROWTH
Special Requests: ADEQUATE

## 2019-10-24 LAB — BASIC METABOLIC PANEL
Anion gap: 10 (ref 5–15)
BUN: 32 mg/dL — ABNORMAL HIGH (ref 8–23)
CO2: 38 mmol/L — ABNORMAL HIGH (ref 22–32)
Calcium: 8.9 mg/dL (ref 8.9–10.3)
Chloride: 94 mmol/L — ABNORMAL LOW (ref 98–111)
Creatinine, Ser: 0.81 mg/dL (ref 0.61–1.24)
GFR calc Af Amer: >60 mL/min (ref >60)
GFR calc non Af Amer: >60 mL/min (ref >60)
Glucose, Bld: 106 mg/dL — ABNORMAL HIGH (ref 70–99)
Potassium: 3.8 mmol/L (ref 3.5–5.1)
Sodium: 142 mmol/L (ref 135–145)

## 2019-10-24 LAB — GLUCOSE, CAPILLARY
Glucose-Capillary: 106 mg/dL — ABNORMAL HIGH (ref 70–99)
Glucose-Capillary: 107 mg/dL — ABNORMAL HIGH (ref 70–99)
Glucose-Capillary: 113 mg/dL — ABNORMAL HIGH (ref 70–99)
Glucose-Capillary: 117 mg/dL — ABNORMAL HIGH (ref 70–99)
Glucose-Capillary: 119 mg/dL — ABNORMAL HIGH (ref 70–99)
Glucose-Capillary: 124 mg/dL — ABNORMAL HIGH (ref 70–99)

## 2019-10-24 LAB — CBC
HCT: 33.5 % — ABNORMAL LOW (ref 39.0–52.0)
Hemoglobin: 9.1 g/dL — ABNORMAL LOW (ref 13.0–17.0)
MCH: 23.9 pg — ABNORMAL LOW (ref 26.0–34.0)
MCHC: 27.2 g/dL — ABNORMAL LOW (ref 30.0–36.0)
MCV: 87.9 fL (ref 80.0–100.0)
Platelets: 250 10*3/uL (ref 150–400)
RBC: 3.81 MIL/uL — ABNORMAL LOW (ref 4.22–5.81)
RDW: 17 % — ABNORMAL HIGH (ref 11.5–15.5)
WBC: 10.6 10*3/uL — ABNORMAL HIGH (ref 4.0–10.5)
nRBC: 0 % (ref 0.0–0.2)

## 2019-10-24 LAB — MAGNESIUM: Magnesium: 1.8 mg/dL (ref 1.7–2.4)

## 2019-10-24 LAB — PHOSPHORUS: Phosphorus: 4 mg/dL (ref 2.5–4.6)

## 2019-10-24 MED ORDER — GERHARDT'S BUTT CREAM
TOPICAL_CREAM | Freq: Three times a day (TID) | CUTANEOUS | Status: DC
  Administered 2019-10-25 – 2019-11-04 (×8): 1 via TOPICAL
  Filled 2019-10-24 (×4): qty 1

## 2019-10-24 NOTE — Progress Notes (Signed)
Been complaining that air mattress is not working well. 3 RN and bed staff came to check on it and changed setting, seemed like pt is not comfortable with it. There are no available air mattress  in the hospital.right now , orders made outside. Explained to the pt.

## 2019-10-24 NOTE — Progress Notes (Signed)
OT Cancellation Note  Patient Details Name: Jonathan Wise MRN: [id number] DOB: 25-Jun-1945   Cancelled Treatment:    Reason Eval/Treat Not Completed: Other (comment) Pt has been bedbound for 5 years and is LTC resident. Will defer any OT needs to SNF. Acute OT signing off.   Thornell Mule, OT/L   Acute OT Clinical Specialist Acute Rehabilitation Services Pager [phone] Office [phone]  10/24/2019, 11:59 AM

## 2019-10-24 NOTE — Progress Notes (Signed)
Heart rate noted to maintain in the 150s-160s, Iv prn metoprolol 5mg  given. HR improved to 120s, b/p prior to administration 104/87. Will continue to observe. Able to arouse patient with voice/touch.

## 2019-10-24 NOTE — Consult Note (Signed)
WOC consult requested for wounds; this was already performed on 7/12.  Refer to previous progress notes for assessment and measurements, and topical treatment orders have been provided for bedside nurses to perform daily.  Please re-consult if further assistance is needed.  Thank-you,  Cammie Mcgee MSN, RN, CWOCN, [place], CNS [phone]

## 2019-10-24 NOTE — Progress Notes (Signed)
PT Cancellation Note  Patient Details Name: Jonathan Wise MRN: [id number] DOB: 29-Sep-1945   Cancelled Treatment:    Reason Eval/Treat Not Completed: PT screened, no needs identified, will sign off. Reviewing notes from prior encounters and found pt has been bed bound >5 years. Resides in long term care facility. No role for acute PT.   Angelina Ok [facility] 10/24/2019, 11:19 AM Skip Mayer PT Acute Rehabilitation Services Pager [phone] Office [phone]

## 2019-10-24 NOTE — Progress Notes (Signed)
PROGRESS NOTE    Jonathan Wise  TDS:[id number] DOB: [birth year] DOA: 10/19/2019 PCP: Christain [place], MD    Brief Narrative:  Patient was admitted to the hospital with working diagnosis of septic shock due to urinary tract infection/right lower lobe pneumonia, present on admission.  74 year old male with past medical history of atrial fibrillation, deep vein thrombosis, hypertension, pulmonary hypertension and type 2 diabetes mellitus.  Patient is a nursing home resident, he was reported to have poor oral intake, and being hyporeactive for about 48 hours prior to hospitalization.  He was brought to the hospital due to progressive altered mentation.  On his initial physical examination his temperature was 99.3, blood pressure 60/51, heart rate 38, respiratory rate 19, oxygen saturation 89%.  Patient was ill looking appearing, awake, his lungs had decreased breath sounds bilaterally, heart S1-S2, present rhythm, abdomen protuberant, soft, no lower extremity edema. Sodium 135, potassium 6.8, chloride 97, bicarb 23, glucose 96, BUN 46, creatinine 3.10. White count 21.5, hemoglobin 8.8, hematocrit 45.3, platelets 520.  SARS COVID-19 negative.  Urinalysis 21-50 white cells, more than 50 red cells, specific gravity 1.012.  Head CT no acute changes.  His chest x-ray had a faint opacity right lower lobe, likely layering pleural effusion. EKG 39 bpm, right axis deviation, right bundle branch block, normal QTC, atrial fibrillation rhythm, no significant ST segment or T wave changes.  Patient received 6 L of IV fluids and posteriorly placed on vasopressor therapy with Levophed.  He required invasive mechanical ventilation for airway protection.  Patient received broad-spectrum antibiotic therapy with vancomycin, metronidazole and cefepime.  His cultures have been no growth.  He was liberated from mechanical ventilation on July 14.  Transferred to [facility] July 16.  Assessment & Plan:   Principal Problem:    Severe sepsis with septic shock (HCC) Active Problems:   Venous stasis ulcers (HCC)   Type 2 diabetes mellitus without complication (HCC)   Current use of long term anticoagulation   Atrial fibrillation (HCC)   Acute respiratory failure with hypoxia (HCC)   Sepsis secondary to UTI ([place])   Lobar pneumonia ([place])   DVT (deep venous thrombosis) ([place])   1. Septic shock due to urinary tract infection and right lower lobe pneumonia (small right para-pneumonic effusion). Patient with stable blood pressure 118/80, but continue to have tachycardia. Today his wbc is 10,6. Blood cultures have been no growth x 5 days.   Will continue antibiotic therapy with cefepime. Continue close follow up of cell count and temperature curve. Patient with aggressive fluid resuscitation on admission, now he has a negative fluid balance -4775. Urine output over last 24 H 5,425 ml. Will hold on furosemide for now.   Foley catheter removed yesterday.   2. Metabolic encephalopathy. Patient reports having hallucinations yesterday. This am is more awake and alert. Continue to be very weak and deconditioned. Non ambulatory for the last 2 years.   Continue nutritional support, physical therapy and occupational therapy. Will consult nutrition for recommendations. Discontinue midazolam.   3. Controlled T2DM, Hgb A1c 5.9. will continue glucose cover and monitoring with insulin sliding scale, patient is tolerating po well.   4. HTN. Continue blood pressure control with metoprolol.   5. 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 His calculated BMI is 38,6. Continue local wound care, wounds with dressing in place, will consult wound car for further staging.   6. Chronic atrial fibrillation. Continue anticoagulation with apixaban and rate control with metoprolol.  7. AKI. Stable renal function with serum cr at 0,81 and K at 3,8 with serum bicarbonate at 38.   Follow up on renal panel in am.  Will hold on furosemide for now.   8. Acute on chronic diastolic heart failure. Patient has responded well to diuresis, urine output over last 24 H more than 5 L. Continue metoprolol. Will hold on furosemide for now.   Status is: Inpatient  Remains inpatient appropriate because:Inpatient level of care appropriate due to severity of illness   Dispo: The patient is from: Home              Anticipated d/c is to: SNF              Anticipated d/c date is: 3 days              Patient currently is not medically stable to d/c.   DVT prophylaxis: apixaban  Code Status:   dnr  Family Communication:  I spoke with patient's wife at the bedside, we talked in detail about patient's condition, plan of care and prognosis and all questions were addressed.      Nutrition Status: Nutrition Problem: Inadequate oral intake Etiology: inability to eat Signs/Symptoms: NPO status Interventions: Premier Protein     Antimicrobials:   Cefepime.     Subjective: Patient continue to be very weak and deconditioned, had hallucinations yesterday, positive tremors.   Objective: Vitals:   10/24/19 0532 10/24/19 0831 10/24/19 0832 10/24/19 1151  BP: 118/80     Pulse: 94  92   Resp:      Temp:  98.7 F (37.1 C) 98.7 F (37.1 C) 97.8 F (36.6 C)  TempSrc:  Oral    SpO2:   98%   Weight:      Height:        Intake/Output Summary (Last 24 hours) at 10/24/2019 1219 Last data filed at 10/24/2019 1200 Gross per 24 hour  Intake 205.29 ml  Output 4925 ml  Net -4719.71 ml   Filed Weights   10/22/19 0310 10/23/19 0438 10/23/19 1834  Weight: (!) 139.6 kg (!) 140.2 kg 133 kg    Examination:   General: Not in pain or dyspnea, deconditioned  Neurology: Awake and alert, non focal  E ENT: positive pallor, no icterus, oral mucosa moist Cardiovascular: No JVD. S1-S2 present, rhythmic, no gallops, rubs, or murmurs. Trace lower extremity edema. Pulmonary: positive breath sounds bilaterally, adequate air  movement, no wheezing, rhonchi or rales. Gastrointestinal. Abdomen protuberant with, no organomegaly, non tender, no rebound or guarding Skin. No rashes Musculoskeletal: no joint deformities     Data Reviewed: I have personally reviewed following labs and imaging studies  CBC: Recent Labs  Lab 10/19/19 1610 10/19/19 1610 10/20/19 0408 10/20/19 0538 10/20/19 0160 10/21/19 0317 10/22/19 0302 10/23/19 0404 10/24/19 0407  WBC 21.5*   < > 31.0*  --   --  20.2* 17.8* 12.7* 10.6*  NEUTROABS 16.5*  --   --   --   --   --   --   --   --   HGB 11.8*   < > 11.0*   < > 12.6* 10.2* 9.7* 9.6* 9.1*  HCT 45.3   < > 39.4   < > 37.0* 34.2* 33.4* 34.6* 33.5*  MCV 94.6   < > 86.4  --   --  81.2 82.7 87.8 87.9  PLT 520*   < > 474*  --   --  373 333 253 250   < > =  values in this interval not displayed.   Basic Metabolic Panel: Recent Labs  Lab 10/21/19 0317 10/21/19 0317 10/21/19 1423 10/21/19 1423 10/21/19 2124 10/22/19 0302 10/22/19 1901 10/23/19 0404 10/23/19 1839 10/24/19 0407  NA 142   < > 139   < >  --  140 141 143 141 142  K 4.0   < > 3.6   < >  --  3.5 3.6 3.5 4.0 3.8  CL 103   < > 101   < >  --  101 99 98 96* 94*  CO2 25   < > 27   < >  --  28 33* 35* 34* 38*  GLUCOSE 137*   < > 171*   < >  --  191* 179* 112* 123* 106*  BUN 45*   < > 42*   < >  --  44* 40* 36* 36* 32*  CREATININE 2.66*   < > 2.15*   < >  --  1.66* 1.15 1.10 0.84 0.81  CALCIUM 7.9*   < > 7.7*   < >  --  7.9* 8.0* 8.3* 8.7* 8.9  MG 1.8   < > 2.0  --  1.9 1.9 1.7  --   --  1.8  PHOS 2.5  --   --   --  1.9* 2.0* 3.9  --   --  4.0   < > = values in this interval not displayed.   GFR: Estimated Creatinine Clearance: 114.4 mL/min (by C-G formula based on SCr of 0.81 mg/dL). Liver Function Tests: Recent Labs  Lab 10/19/19 1610  AST 54*  ALT 27  ALKPHOS 79  BILITOT 1.1  PROT 6.7  ALBUMIN 3.0*   No results for input(s): LIPASE, AMYLASE in the last 168 hours. Recent Labs  Lab 10/20/19 0732  AMMONIA 35    Coagulation Profile: Recent Labs  Lab 10/19/19 1610  INR 1.6*   Cardiac Enzymes: No results for input(s): CKTOTAL, CKMB, CKMBINDEX, TROPONINI in the last 168 hours. BNP (last 3 results) No results for input(s): PROBNP in the last 8760 hours. HbA1C: No results for input(s): HGBA1C in the last 72 hours. CBG: Recent Labs  Lab 10/23/19 1919 10/23/19 2341 10/24/19 0342 10/24/19 0821 10/24/19 1109  GLUCAP 105* 101* 106* 117* 124*   Lipid Profile: No results for input(s): CHOL, HDL, LDLCALC, TRIG, CHOLHDL, LDLDIRECT in the last 72 hours. Thyroid Function Tests: No results for input(s): TSH, T4TOTAL, FREET4, T3FREE, THYROIDAB in the last 72 hours. Anemia Panel: No results for input(s): VITAMINB12, FOLATE, FERRITIN, TIBC, IRON, RETICCTPCT in the last 72 hours.    Radiology Studies: I have reviewed all of the imaging during this hospital visit personally     Scheduled Meds: . apixaban  5 mg Oral BID  . chlorhexidine gluconate (MEDLINE KIT)  15 mL Mouth Rinse BID  . Chlorhexidine Gluconate Cloth  6 each Topical Daily  . docusate sodium  100 mg Oral BID  . furosemide  40 mg Intravenous BID  . Gerhardt's butt cream   Topical TID  . insulin aspart  0-9 Units Subcutaneous Q4H  . mouth rinse  15 mL Mouth Rinse BID  . metoprolol tartrate  25 mg Oral Q8H  . mupirocin ointment   Nasal BID  . polyethylene glycol  17 g Oral Daily  . Ensure Max Protein  11 oz Oral BID  . sodium chloride flush  10-40 mL Intracatheter Q12H   Continuous Infusions: . sodium chloride Stopped (10/20/19 1449)  .  ceFEPime (MAXIPIME) IV 2 g (10/24/19 0235)     LOS: 5 days        Shardae Kleinman Gerome Apley, MD

## 2019-10-25 DIAGNOSIS — J181 Lobar pneumonia, unspecified organism: Secondary | ICD-10-CM

## 2019-10-25 DIAGNOSIS — Z7901 Long term (current) use of anticoagulants: Secondary | ICD-10-CM

## 2019-10-25 LAB — GLUCOSE, CAPILLARY
Glucose-Capillary: 104 mg/dL — ABNORMAL HIGH (ref 70–99)
Glucose-Capillary: 104 mg/dL — ABNORMAL HIGH (ref 70–99)
Glucose-Capillary: 118 mg/dL — ABNORMAL HIGH (ref 70–99)
Glucose-Capillary: 105 mg/dL — ABNORMAL HIGH (ref 70–99)
Glucose-Capillary: 85 mg/dL (ref 70–99)
Glucose-Capillary: 82 mg/dL (ref 70–99)

## 2019-10-25 LAB — BASIC METABOLIC PANEL
Anion gap: 9 (ref 5–15)
BUN: 24 mg/dL — ABNORMAL HIGH (ref 8–23)
CO2: 40 mmol/L — ABNORMAL HIGH (ref 22–32)
Calcium: 9 mg/dL (ref 8.9–10.3)
Chloride: 92 mmol/L — ABNORMAL LOW (ref 98–111)
Creatinine, Ser: 0.67 mg/dL (ref 0.61–1.24)
GFR calc Af Amer: >60 mL/min (ref >60)
GFR calc non Af Amer: >60 mL/min (ref >60)
Glucose, Bld: 102 mg/dL — ABNORMAL HIGH (ref 70–99)
Potassium: 3.4 mmol/L — ABNORMAL LOW (ref 3.5–5.1)
Sodium: 141 mmol/L (ref 135–145)

## 2019-10-25 MED ORDER — POTASSIUM CHLORIDE CRYS ER 20 MEQ PO TBCR
40.0000 meq | EXTENDED_RELEASE_TABLET | Freq: Once | ORAL | Status: AC
  Administered 2019-10-25: 40 meq via ORAL
  Filled 2019-10-25: qty 2

## 2019-10-25 NOTE — Plan of Care (Signed)

## 2019-10-25 NOTE — Progress Notes (Signed)
PROGRESS NOTE    Jonathan Wise  EUM:[id number] DOB: 1946/03/24 DOA: 10/19/2019 PCP: Christain Sacramento, MD    Brief Narrative:  Patient was admitted to the hospital with working diagnosis of septic shock due to urinary tract infection/right lower lobe pneumonia, present on admission.  74 year old male with past medical history of atrial fibrillation, deep vein thrombosis, hypertension, pulmonary hypertension and type 2 diabetes mellitus.  Patient is a nursing home resident, he was reported to have poor oral intake, and being hyporeactive for about 48 hours prior to hospitalization.  He was brought to the hospital due to progressive altered mentation.  On his initial physical examination his temperature was 99.3, blood pressure 60/51, heart rate 38, respiratory rate 19, oxygen saturation 89%.  Patient was ill looking appearing, awake, his lungs had decreased breath sounds bilaterally, heart S1-S2, present rhythm, abdomen protuberant, soft, no lower extremity edema. Sodium 135, potassium 6.8, chloride 97, bicarb 23, glucose 96, BUN 46, creatinine 3.10. White count 21.5, hemoglobin 8.8, hematocrit 45.3, platelets 520.  SARS COVID-19 negative.  Urinalysis 21-50 white cells, more than 50 red cells, specific gravity 1.012.  Head CT no acute changes.  His chest x-ray had a faint opacity right lower lobe, likely layering pleural effusion. EKG 39 bpm, right axis deviation, right bundle branch block, normal QTC, atrial fibrillation rhythm, no significant ST segment or T wave changes.  Patient received 6 L of IV fluids and posteriorly placed on vasopressor therapy with Levophed.  He required invasive mechanical ventilation for airway protection.  Patient received broad-spectrum antibiotic therapy with vancomycin, metronidazole and cefepime.  His cultures have been no growth.  He was liberated from mechanical ventilation on July 14.  Transferred to [facility] July 16.  Patient with no further hallucinations but  continue to have jerking movements.   Assessment & Plan:   Principal Problem:   Severe sepsis with septic shock (HCC) Active Problems:   Venous stasis ulcers (HCC)   Type 2 diabetes mellitus without complication (HCC)   Current use of long term anticoagulation   Atrial fibrillation (HCC)   Acute respiratory failure with hypoxia (HCC)   Sepsis secondary to UTI ([place])   Lobar pneumonia ([place])   DVT (deep venous thrombosis) ([place])    1. Septic shock due to urinary tract infection and right lower lobe pneumonia (small right para-pneumonic effusion). Continue patient to be hemodynamically stable. Improved volume status.   Tolerating well antibiotic therapy with cefepime #4. Continue to hold on diuretic therapy.  Patient continue to have foley catheter, will request it to be removed today and monitor urine output.   2. Metabolic encephalopathy. Improved mentation, but continue to have involuntary jerking movements. Continue nutritional support. Now off benzodiazepines.   Patient is non ambulatory but would like to go to SNF for further care. Will consult social work for [employer].   3. Controlled T2DM, Hgb A1c 5.9. fasting glucose 102 this am, will continue glucose cover and monitoring with insulin sliding scale. Patient is tolerating po well.   4. HTN. On metoprolol for blood pressure control.   5. 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 His calculated BMI is 38,6. On local wound care, wounds with dressing in place, follow wound care recommendations.   6. Chronic atrial fibrillation.  Rate control with metoprolol and anticoagulation with apixaban. HR 92  7. AKI with hypokalemia/ metabolic alkalosis. Stable renal function with serum cr at 0.,67, K at 3,4 and serum bicarbonate at 40.  Add 46 Kcl for K correction, continue to hold on diuretic therapy and follow on renal panel in am.   8. Acute on chronic diastolic heart failure.   Clinically his volume is stable, will continue blood pressure and atrial fibrillation control. Continue to hold on furosemide for now.     Status is: Inpatient  Remains inpatient appropriate because:Inpatient level of care appropriate due to severity of illness   Dispo: The patient is from: Home              Anticipated d/c is to: SNF              Anticipated d/c date is: 2 days              Patient currently is not medically stable to d/c.   DVT prophylaxis: apixaban   Code Status:    dnr   Family Communication:  I spoke with patient's wife at the bedside, we talked in detail about patient's condition, plan of care and prognosis and all questions were addressed.      Nutrition Status: Nutrition Problem: Inadequate oral intake Etiology: inability to eat Signs/Symptoms: NPO status Interventions: Premier Protein     Skin Documentation: Pressure Injury 05/14/19 Ischial tuberosity Right Stage 2 -  Partial thickness loss of dermis presenting as a shallow open injury with a red, pink wound bed without slough. WOC updated FS to list left and right ischial wounds separately: copied and paste (Active)  05/14/19 7616  Location: Ischial tuberosity  Location Orientation: Right  Staging: Stage 2 -  Partial thickness loss of dermis presenting as a shallow open injury with a red, pink wound bed without slough.  Wound Description (Comments): WOC updated FS to list left and right ischial wounds separately: copied and pasted this note from admitting RN  right black wound about 1 cm round on right buttock cheek. Entire buttock area red non blanchable   Present on Admission: Yes     Pressure Injury 06/18/19 Sacrum Stage 1 -  Intact skin with non-blanchable redness of a localized area usually over a bony prominence. excoriated redness noted to sacrum that extends to perineum and testicles.  (Active)  06/18/19 1309  Location: Sacrum  Location Orientation:   Staging: Stage 1 -  Intact skin with  non-blanchable redness of a localized area usually over a bony prominence.  Wound Description (Comments): excoriated redness noted to sacrum that extends to perineum and testicles.   Present on Admission:       Antimicrobials:   Cefepime    Subjective: Patient feeling better with no hallucinations, but not yet back to baseline, he continue to have jerking movements of head and arms, not voluntary   Objective: Vitals:   10/25/19 0413 10/25/19 0548 10/25/19 0751 10/25/19 1140  BP: 120/72  139/73 134/78  Pulse: (!) 103  97 92  Resp: 15  14 15   Temp: 98.3 F (36.8 C)  97.9 F (36.6 C) 97.8 F (36.6 C)  TempSrc: Oral  Oral Oral  SpO2: 97%  98% 99%  Weight:  128 kg    Height:        Intake/Output Summary (Last 24 hours) at 10/25/2019 1254 Last data filed at 10/25/2019 0908 Gross per 24 hour  Intake 260 ml  Output 1700 ml  Net -1440 ml   Filed Weights   10/23/19 0438 10/23/19 1834 10/25/19 0548  Weight: (!) 140.2 kg 133 kg 128 kg    Examination:   General: Not in pain  or dyspnea, deconditioned  Neurology: Awake and alert, non focal, sporadic jerking arm movements,  E ENT: mild pallor, no icterus, oral mucosa moist Cardiovascular: No JVD. S1-S2 present, rhythmic, no gallops, rubs, or murmurs. +++ non pitting lower extremity edema. Pulmonary:  Positive breath sounds bilaterally, decreased air movement, no wheezing, rhonchi or rales. Gastrointestinal. Abdomen soft and protuberant, no organomegaly, non tender, no rebound or guarding Skin. No rashes Musculoskeletal: no joint deformities     Data Reviewed: I have personally reviewed following labs and imaging studies  CBC: Recent Labs  Lab 10/19/19 1610 10/19/19 1610 10/20/19 0408 10/20/19 0538 10/20/19 3220 10/21/19 0317 10/22/19 0302 10/23/19 0404 10/24/19 0407  WBC 21.5*   < > 31.0*  --   --  20.2* 17.8* 12.7* 10.6*  NEUTROABS 16.5*  --   --   --   --   --   --   --   --   HGB 11.8*   < > 11.0*   < > 12.6*  10.2* 9.7* 9.6* 9.1*  HCT 45.3   < > 39.4   < > 37.0* 34.2* 33.4* 34.6* 33.5*  MCV 94.6   < > 86.4  --   --  81.2 82.7 87.8 87.9  PLT 520*   < > 474*  --   --  373 333 253 250   < > = values in this interval not displayed.   Basic Metabolic Panel: Recent Labs  Lab 10/21/19 0317 10/21/19 0317 10/21/19 1423 10/21/19 1423 10/21/19 2124 10/22/19 0302 10/22/19 0302 10/22/19 1901 10/23/19 0404 10/23/19 1839 10/24/19 0407 10/25/19 0417  NA 142   < > 139   < >  --  140   < > 141 143 141 142 141  K 4.0   < > 3.6   < >  --  3.5   < > 3.6 3.5 4.0 3.8 3.4*  CL 103   < > 101   < >  --  101   < > 99 98 96* 94* 92*  CO2 25   < > 27   < >  --  28   < > 33* 35* 34* 38* 40*  GLUCOSE 137*   < > 171*   < >  --  191*   < > 179* 112* 123* 106* 102*  BUN 45*   < > 42*   < >  --  44*   < > 40* 36* 36* 32* 24*  CREATININE 2.66*   < > 2.15*   < >  --  1.66*   < > 1.15 1.10 0.84 0.81 0.67  CALCIUM 7.9*   < > 7.7*   < >  --  7.9*   < > 8.0* 8.3* 8.7* 8.9 9.0  MG 1.8   < > 2.0  --  1.9 1.9  --  1.7  --   --  1.8  --   PHOS 2.5  --   --   --  1.9* 2.0*  --  3.9  --   --  4.0  --    < > = values in this interval not displayed.   GFR: Estimated Creatinine Clearance: 113.6 mL/min (by C-G formula based on SCr of 0.67 mg/dL). Liver Function Tests: Recent Labs  Lab 10/19/19 1610  AST 54*  ALT 27  ALKPHOS 79  BILITOT 1.1  PROT 6.7  ALBUMIN 3.0*   No results for input(s): LIPASE, AMYLASE in the last 168 hours. Recent Labs  Lab 10/20/19 0732  AMMONIA 35   Coagulation Profile: Recent Labs  Lab 10/19/19 1610  INR 1.6*   Cardiac Enzymes: No results for input(s): CKTOTAL, CKMB, CKMBINDEX, TROPONINI in the last 168 hours. BNP (last 3 results) No results for input(s): PROBNP in the last 8760 hours. HbA1C: No results for input(s): HGBA1C in the last 72 hours. CBG: Recent Labs  Lab 10/24/19 2103 10/24/19 2339 10/25/19 0413 10/25/19 0755 10/25/19 1137  GLUCAP 107* 113* 104* 104* 118*    Lipid Profile: No results for input(s): CHOL, HDL, LDLCALC, TRIG, CHOLHDL, LDLDIRECT in the last 72 hours. Thyroid Function Tests: No results for input(s): TSH, T4TOTAL, FREET4, T3FREE, THYROIDAB in the last 72 hours. Anemia Panel: No results for input(s): VITAMINB12, FOLATE, FERRITIN, TIBC, IRON, RETICCTPCT in the last 72 hours.    Radiology Studies: I have reviewed all of the imaging during this hospital visit personally     Scheduled Meds: . apixaban  5 mg Oral BID  . chlorhexidine gluconate (MEDLINE KIT)  15 mL Mouth Rinse BID  . Chlorhexidine Gluconate Cloth  6 each Topical Daily  . docusate sodium  100 mg Oral BID  . Gerhardt's butt cream   Topical TID  . insulin aspart  0-9 Units Subcutaneous Q4H  . mouth rinse  15 mL Mouth Rinse BID  . metoprolol tartrate  25 mg Oral Q8H  . mupirocin ointment   Nasal BID  . polyethylene glycol  17 g Oral Daily  . Ensure Max Protein  11 oz Oral BID  . sodium chloride flush  10-40 mL Intracatheter Q12H   Continuous Infusions: . sodium chloride Stopped (10/20/19 1449)  . ceFEPime (MAXIPIME) IV 2 g (10/25/19 1059)     LOS: 6 days        Jovann Luse Gerome Apley, MD

## 2019-10-26 LAB — GLUCOSE, CAPILLARY
Glucose-Capillary: 76 mg/dL (ref 70–99)
Glucose-Capillary: 101 mg/dL — ABNORMAL HIGH (ref 70–99)
Glucose-Capillary: 90 mg/dL (ref 70–99)
Glucose-Capillary: 96 mg/dL (ref 70–99)
Glucose-Capillary: 119 mg/dL — ABNORMAL HIGH (ref 70–99)

## 2019-10-26 MED ORDER — METOPROLOL TARTRATE 5 MG/5ML IV SOLN
5.0000 mg | INTRAVENOUS | Status: DC | PRN
  Administered 2019-10-26: 5 mg via INTRAVENOUS
  Filled 2019-10-26: qty 5

## 2019-10-26 MED ORDER — INSULIN ASPART 100 UNIT/ML ~~LOC~~ SOLN
0.0000 [IU] | Freq: Three times a day (TID) | SUBCUTANEOUS | Status: DC
  Administered 2019-10-27: 2 [IU] via SUBCUTANEOUS
  Administered 2019-10-28 – 2019-10-29 (×2): 1 [IU] via SUBCUTANEOUS
  Administered 2019-10-30 (×2): 2 [IU] via SUBCUTANEOUS
  Administered 2019-10-30: 1 [IU] via SUBCUTANEOUS
  Administered 2019-10-31 (×2): 2 [IU] via SUBCUTANEOUS
  Administered 2019-10-31: 3 [IU] via SUBCUTANEOUS
  Administered 2019-11-01: 2 [IU] via SUBCUTANEOUS
  Administered 2019-11-01: 5 [IU] via SUBCUTANEOUS
  Administered 2019-11-01 – 2019-11-02 (×2): 3 [IU] via SUBCUTANEOUS
  Administered 2019-11-02: 7 [IU] via SUBCUTANEOUS
  Administered 2019-11-02: 2 [IU] via SUBCUTANEOUS
  Administered 2019-11-03: 7 [IU] via SUBCUTANEOUS
  Administered 2019-11-03: 5 [IU] via SUBCUTANEOUS
  Administered 2019-11-03: 3 [IU] via SUBCUTANEOUS
  Administered 2019-11-04: 2 [IU] via SUBCUTANEOUS
  Administered 2019-11-04: 5 [IU] via SUBCUTANEOUS

## 2019-10-26 NOTE — Progress Notes (Signed)
Patient's wife brought DNR paper was signed by Feb, 2021 and asked to file our medical recodes. Filed it on the chart. Updated to patient's grand daughter Joselyn Glassman) by phone. She concerned about patient's condition as well as placement. She expressed that patient's wife couldn't get enough information from hospital, at the same time, she is [facility] and hard to understanding stuff. Joselyn Glassman said that she is also POA. Patient has financial issue regarding sending to SNF or home. Patient is Medicare and tried to apply Medicaid and rejected in the past. She was tearful the situation of patient. Patient had one daughter who was die and he has three grand daughters. Joselyn Glassman Lurena Joiner were helping him for financially to hire home aide when he was at home. Will put on the social worker consult regarding this matter and talked Dr. Ella Jubilee as well. She mentioned patient said that he doesn't want to live. Patient's oral intake is poor. Patient request chaplin yesterday and chaplin came to pray and talked to him. Patient looks depressed the situation. Encouraged patient to focus on get well. HS [employer]

## 2019-10-26 NOTE — Progress Notes (Addendum)
PROGRESS NOTE    Jonathan Wise  EXH:[id number] DOB: 05-22-1945 DOA: 10/19/2019 PCP: Christain Sacramento, MD    Brief Narrative:  Patient was admitted to the hospital with working diagnosis of septic shock due to urinary tract infection/right lower lobe pneumonia, present on admission.  74 year old male with past medical history of atrial fibrillation, deep vein thrombosis, hypertension, pulmonary hypertension and type 2 diabetes mellitus. Patient is a nursing home resident, he was reported to have poor oral intake, and being hyporeactive for about 48 hours prior to hospitalization. He was brought to the hospital due to progressive altered mentation. On his initial physical examination his temperature was 99.3, blood pressure 60/51, heart rate 38, respiratory rate 19, oxygen saturation 89%.Patient was ill looking appearing, awake, his lungs had decreased breath sounds bilaterally, heart S1-S2, present rhythm, abdomen protuberant, soft, no lower extremity edema. Sodium 135, potassium 6.8, chloride 97, bicarb 23, glucose 96, BUN 46, creatinine 3.10.White count 21.5, hemoglobin 8.8, hematocrit 45.3, platelets 520.SARS COVID-19 negative. Urinalysis 21-50 white cells, more than 50 red cells, specific gravity 1.012. Head CT no acute changes. His chest x-ray had a faint opacity right lower lobe, likely layering pleural effusion. EKG 39 bpm, right axis deviation, right bundle branch block, normal QTC, atrial fibrillation rhythm, no significant ST segment or T wave changes.  Patient received 6 L of IV fluids and posteriorly placed on vasopressor therapy with Levophed. He required invasive mechanical ventilation for airway protection.  Patient received broad-spectrum antibiotic therapy with vancomycin, metronidazole and cefepime. His cultures have been no growth. He was liberated from mechanical ventilation on July 14.  Transferred to [facility] July 16.  Patient with no further hallucinations but  continue to have jerking movements.   He continues to be very weak and deconditioned, plan for SNF.   Assessment & Plan:   Principal Problem:   Severe sepsis with septic shock (HCC) Active Problems:   Venous stasis ulcers (HCC)   Type 2 diabetes mellitus without complication (HCC)   Current use of long term anticoagulation   Atrial fibrillation (HCC)   Acute respiratory failure with hypoxia (HCC)   Sepsis secondary to UTI ([place])   Lobar pneumonia ([place])   DVT (deep venous thrombosis) ([place])   1. Septic shock due to urinary tract infection and right lower lobe pneumonia (small right para-pneumonic effusion). Patient continue hemodynamically stable with blood pressure 130/68 and HR 104.   Antibiotic therapy with cefepime #3/8.   2. Metabolic encephalopathy. This am mild somnolence but easy to arouse. Continue nero checks and nutritional support. Hallucinations have resolved, no confusion or agitation.   3. Controlled T2DM, Hgb A1c 5.9. capillary glucose 82, 76, 101. Continue insulin sliding scale for glucose cover and monitoring.   4. HTN. continue with metoprolol for blood pressure control.   5. 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 His calculated BMI is 38,6. continue with local wound care, wounds with dressing in place.    6. Chronic atrial fibrillation.  Continue metoprolol and apixaban. Patient with poor mobility. Holding verapamil for now.   7. AKI with hypokalemia/ metabolic alkalosis. Pending renal panel this am. Continue to hold on diuretic therapy for now.   8. Acute on chronic diastolic heart failure.  Continue blood pressure control. At home patient not on diuretic therapy.    Status is: Inpatient  Remains inpatient appropriate because:Inpatient level of care appropriate due to severity of illness   Dispo: The patient is from: Home  Anticipated d/c is to: SNF              Anticipated d/c date is: 2  days              Patient currently is not medically stable to d/c.  DVT prophylaxis: apixaban   Code Status:    dnr   Family Communication:  I spoke with patient's wife at the bedside, we talked in detail about patient's condition, plan of care and prognosis and all questions were addressed.  I spoke over the phone with the patient's grandaughter about patient's  condition, plan of care, prognosis and all questions were addressed.    Nutrition Status: Nutrition Problem: Inadequate oral intake Etiology: inability to eat Signs/Symptoms: NPO status Interventions: Premier Protein      Antimicrobials:   Cefepime     Subjective: Patient this am with mild somnolence but easy to arouse, no nausea or vomiting, no dyspnea or chest pain.   Objective: Vitals:   10/25/19 2331 10/26/19 0349 10/26/19 0500 10/26/19 0722  BP: 126/83 140/84  130/68  Pulse: 98 68  (!) 104  Resp: _0 Temp: 98.2 F (36.8 C) 98.3 F (36.8 C)  98 F (36.7 C)  TempSrc: Oral Oral  Oral  SpO2: 92% 97%  98%  Weight:   130 kg   Height:        Intake/Output Summary (Last 24 hours) at 10/26/2019 0836 Last data filed at 10/26/2019 0200 Gross per 24 hour  Intake 380 ml  Output 1400 ml  Net -1020 ml   Filed Weights   10/23/19 1834 10/25/19 0548 10/26/19 0500  Weight: 133 kg 128 kg 130 kg    Examination:   General: deconditioned  Neurology: this am somnolent, but less of jerking movements.  E ENT: no pallor, no icterus, oral mucosa moist Cardiovascular: No JVD. S1-S2 present, rhythmic, no gallops, rubs, or murmurs. Positive ++ non pitting lower extremity edema. Pulmonary: decreased breath sounds bilaterally, adequate air movement, no wheezing, rhonchi or rales. Gastrointestinal. Abdomen protuberant with, no organomegaly, non tender, no rebound or guarding Skin. No rashes Musculoskeletal: no joint deformities     Data Reviewed: I have personally reviewed following labs and imaging  studies  CBC: Recent Labs  Lab 10/19/19 1610 10/19/19 1610 10/20/19 0408 10/20/19 0538 10/20/19 1610 10/21/19 0317 10/22/19 0302 10/23/19 0404 10/24/19 0407  WBC 21.5*   < > 31.0*  --   --  20.2* 17.8* 12.7* 10.6*  NEUTROABS 16.5*  --   --   --   --   --   --   --   --   HGB 11.8*   < > 11.0*   < > 12.6* 10.2* 9.7* 9.6* 9.1*  HCT 45.3   < > 39.4   < > 37.0* 34.2* 33.4* 34.6* 33.5*  MCV 94.6   < > 86.4  --   --  81.2 82.7 87.8 87.9  PLT 520*   < > 474*  --   --  373 333 253 250   < > = values in this interval not displayed.   Basic Metabolic Panel: Recent Labs  Lab 10/21/19 0317 10/21/19 0317 10/21/19 1423 10/21/19 1423 10/21/19 2124 10/22/19 0302 10/22/19 0302 10/22/19 1901 10/23/19 0404 10/23/19 1839 10/24/19 0407 10/25/19 0417  NA 142   < > 139   < >  --  140   < > 141 143 141 142 141  K 4.0   < > 3.6   < >  --  3.5   < > 3.6 3.5 4.0 3.8 3.4*  CL 103   < > 101   < >  --  101   < > 99 98 96* 94* 92*  CO2 25   < > 27   < >  --  28   < > 33* 35* 34* 38* 40*  GLUCOSE 137*   < > 171*   < >  --  191*   < > 179* 112* 123* 106* 102*  BUN 45*   < > 42*   < >  --  44*   < > 40* 36* 36* 32* 24*  CREATININE 2.66*   < > 2.15*   < >  --  1.66*   < > 1.15 1.10 0.84 0.81 0.67  CALCIUM 7.9*   < > 7.7*   < >  --  7.9*   < > 8.0* 8.3* 8.7* 8.9 9.0  MG 1.8   < > 2.0  --  1.9 1.9  --  1.7  --   --  1.8  --   PHOS 2.5  --   --   --  1.9* 2.0*  --  3.9  --   --  4.0  --    < > = values in this interval not displayed.   GFR: Estimated Creatinine Clearance: 114.5 mL/min (by C-G formula based on SCr of 0.67 mg/dL). Liver Function Tests: Recent Labs  Lab 10/19/19 1610  AST 54*  ALT 27  ALKPHOS 79  BILITOT 1.1  PROT 6.7  ALBUMIN 3.0*   No results for input(s): LIPASE, AMYLASE in the last 168 hours. Recent Labs  Lab 10/20/19 0732  AMMONIA 35   Coagulation Profile: Recent Labs  Lab 10/19/19 1610  INR 1.6*   Cardiac Enzymes: No results for input(s): CKTOTAL, CKMB,  CKMBINDEX, TROPONINI in the last 168 hours. BNP (last 3 results) No results for input(s): PROBNP in the last 8760 hours. HbA1C: No results for input(s): HGBA1C in the last 72 hours. CBG: Recent Labs  Lab 10/25/19 1651 10/25/19 1958 10/25/19 2329 10/26/19 0726 10/26/19 0823  GLUCAP 105* 85 82 76 101*   Lipid Profile: No results for input(s): CHOL, HDL, LDLCALC, TRIG, CHOLHDL, LDLDIRECT in the last 72 hours. Thyroid Function Tests: No results for input(s): TSH, T4TOTAL, FREET4, T3FREE, THYROIDAB in the last 72 hours. Anemia Panel: No results for input(s): VITAMINB12, FOLATE, FERRITIN, TIBC, IRON, RETICCTPCT in the last 72 hours.    Radiology Studies: I have reviewed all of the imaging during this hospital visit personally     Scheduled Meds: . apixaban  5 mg Oral BID  . chlorhexidine gluconate (MEDLINE KIT)  15 mL Mouth Rinse BID  . Chlorhexidine Gluconate Cloth  6 each Topical Daily  . docusate sodium  100 mg Oral BID  . Gerhardt's butt cream   Topical TID  . insulin aspart  0-9 Units Subcutaneous Q4H  . mouth rinse  15 mL Mouth Rinse BID  . metoprolol tartrate  25 mg Oral Q8H  . mupirocin ointment   Nasal BID  . polyethylene glycol  17 g Oral Daily  . Ensure Max Protein  11 oz Oral BID  . sodium chloride flush  10-40 mL Intracatheter Q12H   Continuous Infusions: . sodium chloride Stopped (10/20/19 1449)     LOS: 7 days        Shannon Balthazar Gerome Apley, MD

## 2019-10-26 NOTE — Progress Notes (Signed)
Pt was alert and awake when I arrived. His wife was bedside. He said he wanted prayer for his health. I learned that he and his wife have been married over 50 years. Following prayer, he and she were grateful. Please page if additional assistance is needed. Chaplain Elmarie Shiley, MDiv   10/26/19 2000  Clinical Encounter Type  Visited With Patient and family together

## 2019-10-27 DIAGNOSIS — L97416 Non-pressure chronic ulcer of right heel and midfoot with bone involvement without evidence of necrosis: Secondary | ICD-10-CM

## 2019-10-27 DIAGNOSIS — I83014 Varicose veins of right lower extremity with ulcer of heel and midfoot: Secondary | ICD-10-CM

## 2019-10-27 LAB — GLUCOSE, CAPILLARY
Glucose-Capillary: 78 mg/dL (ref 70–99)
Glucose-Capillary: 94 mg/dL (ref 70–99)
Glucose-Capillary: 151 mg/dL — ABNORMAL HIGH (ref 70–99)
Glucose-Capillary: 110 mg/dL — ABNORMAL HIGH (ref 70–99)
Glucose-Capillary: 102 mg/dL — ABNORMAL HIGH (ref 70–99)

## 2019-10-27 LAB — BASIC METABOLIC PANEL
Anion gap: 9 (ref 5–15)
BUN: 24 mg/dL — ABNORMAL HIGH (ref 8–23)
CO2: 41 mmol/L — ABNORMAL HIGH (ref 22–32)
Calcium: 9.2 mg/dL (ref 8.9–10.3)
Chloride: 89 mmol/L — ABNORMAL LOW (ref 98–111)
Creatinine, Ser: 0.63 mg/dL (ref 0.61–1.24)
GFR calc Af Amer: >60 mL/min (ref >60)
GFR calc non Af Amer: >60 mL/min (ref >60)
Glucose, Bld: 92 mg/dL (ref 70–99)
Potassium: 3.5 mmol/L (ref 3.5–5.1)
Sodium: 139 mmol/L (ref 135–145)

## 2019-10-27 NOTE — Progress Notes (Signed)
   10/27/19 0900  Clinical Encounter Type  Visited With Patient  Visit Type Follow-up;Spiritual support  Referral From Patient;Nurse  Consult/Referral To Chaplain  Spiritual Encounters  Spiritual Needs Prayer  Stress Factors  Patient Stress Factors Health changes   Chaplain engaged in prayer with Jonathan Wise.  Jonathan Wise voiced that he has been in a lot of pain and experiencing some weakness.  He feels that he is not doing well at all.  Jonathan Wise noted the significant changes happening to his body because of being diabetic.  He voiced that he has developed a new symptom of shaking along with having an ailment that spread from his legs to his butt and back.  Chaplain offered the ministries of presence and listening, as well as encouragement.  Chaplain will continue to follow-up.

## 2019-10-27 NOTE — Plan of Care (Signed)

## 2019-10-27 NOTE — TOC Initial Note (Signed)
Transition of Care [facility]) - Initial/Assessment Note    Patient Details  Name: Jonathan Wise MRN: [id number] Date of Birth: 1945/11/15  Transition of Care [facility]) CM/SW Contact:    Luiz Blare Phone Number: 10/27/2019, 3:27 PM  Clinical Narrative:                 CSW met with patient and patient's wife [place] bedside. Patient provided permission for Mrs. Amato to be involved in the conversation. PTA, patient lived at home with Mrs. Komar. Patient has been bed bound for 3 years and was last at [facility] in February. Patient and Mrs. Banwart report he has been to [facility] and [facility].  Patient expressed he is in agreement with short-term SNF placement at discharge. CSW explained insurance process to patient and will follow-up.  CSW updated MD on patient's previous SNF history and requested PT/OT evaluate for SNF placement. TOC team will continue to follow.  Expected Discharge Plan: Skilled Nursing Facility Barriers to Discharge: English as a second language teacher, Continued Medical Work up   Patient Goals and CMS Choice   CMS Medicare.gov Compare Post Acute Care list provided to:: Patient Choice offered to / list presented to : Patient  Expected Discharge Plan and Services Expected Discharge Plan: Skilled Nursing Facility       Living arrangements for the past 2 months: Single Family Home                                      Prior Living Arrangements/Services Living arrangements for the past 2 months: Single Family Home Lives with:: Spouse Patient language and need for interpreter reviewed:: Yes Do you feel safe going back to the place where you live?: Yes      Need for Family Participation in Patient Care: Yes (Comment) Care giver support system in place?: Yes (comment)   Criminal Activity/Legal Involvement Pertinent to Current Situation/Hospitalization: No - Comment as needed  Activities of Daily Living      Permission Sought/Granted Permission  sought to share information with : Family Supports, Oceanographer granted to share information with : Yes, Verbal Permission Granted  Share Information with NAME: [place]  Permission granted to share info w AGENCY: SNF  Permission granted to share info w Relationship: Spouse  Permission granted to share info w Contact Information: [phone]  Emotional Assessment   Attitude/Demeanor/Rapport: Unable to Assess Affect (typically observed): Unable to Assess Orientation: : Oriented to Self, Oriented to Place, Oriented to  Time, Oriented to Situation Alcohol / Substance Use: Not Applicable Psych Involvement: No (comment)  Admission diagnosis:  AKI (acute kidney injury) (HCC) [N17.9] Sepsis (HCC) [A41.9] Severe sepsis (HCC) [A41.9, R65.20] Endotracheally intubated [Z97.8] Sepsis with acute renal failure, due to unspecified organism, unspecified acute renal failure type, unspecified whether septic shock present (HCC) [A41.9, R65.20, N17.9] Patient Active Problem List   Diagnosis Date Noted  . Severe sepsis with septic shock (HCC) 10/24/2019  . Sepsis secondary to UTI (HCC) 10/24/2019  . Lobar pneumonia (HCC) 10/24/2019  . DVT (deep venous thrombosis) (HCC) 10/24/2019  . Severe sepsis (HCC) 10/19/2019  . Sepsis (HCC) 10/19/2019  . Pressure injury of skin 06/19/2019  . 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 Bedbound 05/14/2019  . Former heavy tobacco smoker 05/14/2019  . Atrial fibrillation with rapid ventricular response (HCC) 05/13/2019  . Chronic osteoarthritis 01/01/2017  . Lower extremity weakness 01/01/2017  .  Acute respiratory failure with hypoxia ([place]) 12/28/2016  . Anemia of chronic disease 12/25/2015  . [place] intertrigo 12/22/2015  . 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 Bradycardia 03/15/2014  . Lactic acidosis 03/15/2014  . Atrial fibrillation ([place]) 06/23/2012  . PVC's (premature ventricular contractions)  06/23/2012  . Cellulitis of leg, left 06/22/2012  . Sepsis due to pneumonia ([place]) 06/22/2012  . Morbid obesity ([place]) 06/22/2012  . Osteoarthritis of right knee 06/22/2012  . Current use of long term anticoagulation 06/22/2012  . Pulmonary artery hypertension ([place]) 10/18/2011  . Bilateral lower extremity edema 10/14/2011  . Hypotension 10/14/2011  . Venous stasis ulcers ([place]) 10/14/2011  . Type 2 diabetes mellitus without complication ([place]) 92/17/8375   PCP:  Christain Sacramento, MD Pharmacy:   [id number] [place], [place] [address]Phone: [phone]: [phone]    Social Determinants of Health (SDOH) Interventions    Readmission Risk Interventions No flowsheet data found.

## 2019-10-27 NOTE — Progress Notes (Signed)
PROGRESS NOTE    Jonathan Wise  ZOX:[id number] DOB: [birth year] DOA: 10/19/2019 PCP: Christain Sacramento, MD    Brief Narrative:  Patient was admitted to the hospital with working diagnosis of septic shock due to urinary tract infection/right lower lobe pneumonia, present on admission.  74 year old male with past medical history of atrial fibrillation, deep vein thrombosis, hypertension, pulmonary hypertension and type 2 diabetes mellitus. Patient is a nursing home resident, he was reported to have poor oral intake, and being hyporeactive for about 48 hours prior to hospitalization. He was brought to the hospital due to progressive altered mentation. On his initial physical examination his temperature was 99.3, blood pressure 60/51, heart rate 38, respiratory rate 19, oxygen saturation 89%.Patient was ill looking appearing, awake, his lungs had decreased breath sounds bilaterally, heart S1-S2, present rhythm, abdomen protuberant, soft, no lower extremity edema. Sodium 135, potassium 6.8, chloride 97, bicarb 23, glucose 96, BUN 46, creatinine 3.10.White count 21.5, hemoglobin 8.8, hematocrit 45.3, platelets 520.SARS COVID-19 negative. Urinalysis 21-50 white cells, more than 50 red cells, specific gravity 1.012. Head CT no acute changes. His chest x-ray had a faint opacity right lower lobe, likely layering pleural effusion. EKG 39 bpm, right axis deviation, right bundle branch block, normal QTC, atrial fibrillation rhythm, no significant ST segment or T wave changes.  Patient received 6 L of IV fluids and posteriorly placed on vasopressor therapy with Levophed. He required invasive mechanical ventilation for airway protection.  Patient received broad-spectrum antibiotic therapy with vancomycin, metronidazole and cefepime. His cultures have been no growth. He was liberated from mechanical ventilation on July 14.  Transferred to [facility] July 16.  Patient with no further hallucinations but  continue to have jerking movements.  He continues to be very weak and deconditioned, plan for SNF   Assessment & Plan:   Principal Problem:   Severe sepsis with septic shock ([place]) Active Problems:   Venous stasis ulcers ([place])   Type 2 diabetes mellitus without complication ([place])   Current use of long term anticoagulation   Atrial fibrillation (HCC)   Acute respiratory failure with hypoxia (HCC)   Sepsis secondary to UTI ([place])   Lobar pneumonia ([place])   DVT (deep venous thrombosis) ([place])   1. Septic shock due to urinary tract infection and right lower lobe pneumonia (small right para-pneumonic effusion).completed antibiotic therapy with cefepime.  Continue to be hemodynamically stable. Will discontinue telemetry monitoring.   Foley catheter has been removed with good toleration.   2. Metabolic encephalopathy.His mentation continue to improve and looks that he is back to baseline, jerking movements also have improved, no further hallucinations.   Patient not candidate for further inpatient PT per physical therapy recommendations, he has been bed bound for many years.  Will need SNF for custodial care.   3. Controlled T2DM, Hgb A1c 5.9.continue with insulin sliding scale for glucose cover and monitoring.   4. HTN.On metoprololfor blood pressure control.  5. 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 His calculated BMI is 38,6.continue withlocal wound care, wounds with dressing in place. patient is bed bound and non ambulatory.   Will advance diet to regular for now.   6. Chronic atrial fibrillation.Rate controlled with metoprolol and apixaban. He has been hemodynamically stable, will discontinue telemetry for now.   7. AKIwith hypokalemia/ metabolic alkalosis. renal function with stable serum cr at 0,63 with K at 3,5 and serum bicarbonate at 24.  Patient with stable po intake.   8. Acute on  chronic diastolic heart  failure.Clinically euvolemic, will continue blood pressure monitoring and heart rate control    Status is: Inpatient  Remains inpatient appropriate because:Unsafe d/c plan   Dispo: The patient is from: Home              Anticipated d/c is to: SNF              Anticipated d/c date is: 1 day              Patient currently is not medically stable to d/c.   DVT prophylaxis: apixaban   Code Status:   full  Family Communication:  I spoke with patient's wife at the bedside, we talked in detail about patient's condition, plan of care and prognosis and all questions were addressed.       Subjective: Patient continue to feel very weak and deconditioned, no nausea or vomiting, no chest pain or dyspnea.   Objective: Vitals:   10/27/19 0402 10/27/19 0500 10/27/19 0834 10/27/19 1104  BP: 118/62   113/65  Pulse: 65  70 62  Resp: (!) 21  19 20   Temp: 98.2 F (36.8 C)   98 F (36.7 C)  TempSrc: Axillary   Oral  SpO2: 100%  99% 99%  Weight:  131 kg    Height:        Intake/Output Summary (Last 24 hours) at 10/27/2019 1221 Last data filed at 10/27/2019 1042 Gross per 24 hour  Intake 3 ml  Output --  Net 3 ml   Filed Weights   10/25/19 0548 10/26/19 0500 10/27/19 0500  Weight: 128 kg 130 kg 131 kg    Examination:   General: Not in pain or dyspnea,. Deconditioned  Neurology: Awake and alert, non focal  E ENT: no pallor, no icterus, oral mucosa moist Cardiovascular: No JVD. S1-S2 present, rhythmic, no gallops, rubs, or murmurs. +++ non pitting lower extremity edema. Pulmonary: positive breath sounds bilaterally,no wheezing, rhonchi or rales.anterior auscultation.  Gastrointestinal. Abdomen protuberant with no organomegaly, non tender, no rebound or guarding Skin. Lower extremity wounds with dressing in place.  Musculoskeletal: no joint deformities     Data Reviewed: I have personally reviewed following labs and imaging studies  CBC: Recent Labs  Lab 10/21/19 0317  10/22/19 0302 10/23/19 0404 10/24/19 0407  WBC 20.2* 17.8* 12.7* 10.6*  HGB 10.2* 9.7* 9.6* 9.1*  HCT 34.2* 33.4* 34.6* 33.5*  MCV 81.2 82.7 87.8 87.9  PLT 373 333 253 686   Basic Metabolic Panel: Recent Labs  Lab 10/21/19 0317 10/21/19 0317 10/21/19 1423 10/21/19 1423 10/21/19 2124 10/22/19 0302 10/22/19 0302 10/22/19 1901 10/22/19 1901 10/23/19 0404 10/23/19 1839 10/24/19 0407 10/25/19 0417 10/27/19 0300  NA 142   < > 139   < >  --  140   < > 141   < > 143 141 142 141 139  K 4.0   < > 3.6   < >  --  3.5   < > 3.6   < > 3.5 4.0 3.8 3.4* 3.5  CL 103   < > 101   < >  --  101   < > 99   < > 98 96* 94* 92* 89*  CO2 25   < > 27   < >  --  28   < > 33*   < > 35* 34* 38* 40* 41*  GLUCOSE 137*   < > 171*   < >  --  191*   < >  179*   < > 112* 123* 106* 102* 92  BUN 45*   < > 42*   < >  --  44*   < > 40*   < > 36* 36* 32* 24* 24*  CREATININE 2.66*   < > 2.15*   < >  --  1.66*   < > 1.15   < > 1.10 0.84 0.81 0.67 0.63  CALCIUM 7.9*   < > 7.7*   < >  --  7.9*   < > 8.0*   < > 8.3* 8.7* 8.9 9.0 9.2  MG 1.8   < > 2.0  --  1.9 1.9  --  1.7  --   --   --  1.8  --   --   PHOS 2.5  --   --   --  1.9* 2.0*  --  3.9  --   --   --  4.0  --   --    < > = values in this interval not displayed.   GFR: Estimated Creatinine Clearance: 114.9 mL/min (by C-G formula based on SCr of 0.63 mg/dL). Liver Function Tests: No results for input(s): AST, ALT, ALKPHOS, BILITOT, PROT, ALBUMIN in the last 168 hours. No results for input(s): LIPASE, AMYLASE in the last 168 hours. No results for input(s): AMMONIA in the last 168 hours. Coagulation Profile: No results for input(s): INR, PROTIME in the last 168 hours. Cardiac Enzymes: No results for input(s): CKTOTAL, CKMB, CKMBINDEX, TROPONINI in the last 168 hours. BNP (last 3 results) No results for input(s): PROBNP in the last 8760 hours. HbA1C: No results for input(s): HGBA1C in the last 72 hours. CBG: Recent Labs  Lab 10/26/19 1120 10/26/19 1623  10/26/19 2005 10/27/19 0720 10/27/19 1105  GLUCAP 90 96 119* 94 151*   Lipid Profile: No results for input(s): CHOL, HDL, LDLCALC, TRIG, CHOLHDL, LDLDIRECT in the last 72 hours. Thyroid Function Tests: No results for input(s): TSH, T4TOTAL, FREET4, T3FREE, THYROIDAB in the last 72 hours. Anemia Panel: No results for input(s): VITAMINB12, FOLATE, FERRITIN, TIBC, IRON, RETICCTPCT in the last 72 hours.    Radiology Studies: I have reviewed all of the imaging during this hospital visit personally     Scheduled Meds: . apixaban  5 mg Oral BID  . chlorhexidine gluconate (MEDLINE KIT)  15 mL Mouth Rinse BID  . Chlorhexidine Gluconate Cloth  6 each Topical Daily  . docusate sodium  100 mg Oral BID  . Gerhardt's butt cream   Topical TID  . insulin aspart  0-9 Units Subcutaneous TID WC  . mouth rinse  15 mL Mouth Rinse BID  . metoprolol tartrate  25 mg Oral Q8H  . mupirocin ointment   Nasal BID  . polyethylene glycol  17 g Oral Daily  . Ensure Max Protein  11 oz Oral BID  . sodium chloride flush  10-40 mL Intracatheter Q12H   Continuous Infusions: . sodium chloride Stopped (10/20/19 1449)     LOS: 8 days        Tarena Gockley Gerome Apley, MD

## 2019-10-27 NOTE — Plan of Care (Signed)
  Problem: Education: Goal: Knowledge of General Education information will improve Description: Including pain rating scale, medication(s)/side effects and non-pharmacologic comfort measures Outcome: Progressing   Problem: Clinical Measurements: Goal: Ability to maintain clinical measurements within normal limits will improve Outcome: Progressing   Problem: Clinical Measurements: Goal: Diagnostic test results will improve Outcome: Progressing   Problem: Activity: Goal: Risk for activity intolerance will decrease Outcome: Progressing   Problem: Nutrition: Goal: Adequate nutrition will be maintained Outcome: Progressing   Problem: Elimination: Goal: Will not experience complications related to bowel motility Outcome: Progressing   Problem: Pain Managment: Goal: General experience of comfort will improve Outcome: Progressing   Problem: Skin Integrity: Goal: Risk for impaired skin integrity will decrease Outcome: Progressing

## 2019-10-28 LAB — GLUCOSE, CAPILLARY
Glucose-Capillary: 129 mg/dL — ABNORMAL HIGH (ref 70–99)
Glucose-Capillary: 111 mg/dL — ABNORMAL HIGH (ref 70–99)
Glucose-Capillary: 119 mg/dL — ABNORMAL HIGH (ref 70–99)

## 2019-10-28 NOTE — Progress Notes (Signed)
PROGRESS NOTE    Jonathan Wise  YIF:[id number] DOB: 01-04-1946 DOA: 10/19/2019 PCP: Christain Sacramento, MD    Brief Narrative:  Patient was admitted to the hospital with working diagnosis of septic shock due to urinary tract infection/right lower lobe pneumonia, present on admission.  74 year old male with past medical history of atrial fibrillation, deep vein thrombosis, hypertension, pulmonary hypertension and type 2 diabetes mellitus. Patient is a nursing home resident, he was reported to have poor oral intake, and being hyporeactive for about 48 hours prior to hospitalization. He was brought to the hospital due to progressive altered mentation. On his initial physical examination his temperature was 99.3, blood pressure 60/51, heart rate 38, respiratory rate 19, oxygen saturation 89%.Patient was ill looking appearing, awake, his lungs had decreased breath sounds bilaterally, heart S1-S2, present rhythm, abdomen protuberant, soft, no lower extremity edema. Sodium 135, potassium 6.8, chloride 97, bicarb 23, glucose 96, BUN 46, creatinine 3.10.White count 21.5, hemoglobin 8.8, hematocrit 45.3, platelets 520.SARS COVID-19 negative. Urinalysis 21-50 white cells, more than 50 red cells, specific gravity 1.012. Head CT no acute changes. His chest x-ray had a faint opacity right lower lobe, likely layering pleural effusion. EKG 39 bpm, right axis deviation, right bundle branch block, normal QTC, atrial fibrillation rhythm, no significant ST segment or T wave changes.  Patient received 6 L of IV fluids and posteriorly placed on vasopressor therapy with Levophed. He required invasive mechanical ventilation for airway protection.  Patient received broad-spectrum antibiotic therapy with vancomycin, metronidazole and cefepime. His cultures have been no growth. He was liberated from mechanical ventilation on July 14.  Transferred to [facility] July 16.  Patient with no further hallucinations but  continue to have jerking movements for a 48 H, now resolved.His volume status has improved.   He continues to be very weak and deconditioned, he is hoping to be dc to SNF, pending PT evaluation.    Assessment & Plan:   Principal Problem:   Severe sepsis with septic shock (HCC) Active Problems:   Venous stasis ulcers (HCC)   Type 2 diabetes mellitus without complication (HCC)   Current use of long term anticoagulation   Atrial fibrillation (HCC)   Acute respiratory failure with hypoxia (HCC)   Sepsis secondary to UTI ([place])   Lobar pneumonia ([place])   DVT (deep venous thrombosis) ([place])   1. Septic shock due to urinary tract infection and right lower lobe pneumonia (small right para-pneumonic effusion).completed antibiotic therapy with cefepime. Patient has remained hemodynamically stable, HR controlled and no further hypotension.    2. Metabolic encephalopathy.He repots slow memory, but no confusion or hallucinations. He is very weak and deconditioned bedridden for many years.   I have talked to PT for inpatient evaluation, check if he will qualify for therapy at SNF. If not patient may need custodial care. Continue neuro checks per unit protocol.   3. Controlled T2DM, Hgb A1c 5.9.On insulin sliding scale for glucose cover and monitoring. Capillary glucose has remained well controlled 129, 111. He is tolerating po well.   4. HTN.Continue with  metoprololfor blood pressure control.  5. 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 His calculated BMI is 38,6. On local wound care, wounds with dressing in place.  6. Chronic atrial fibrillation.Continue rate controlled with metoprolol and anticoagulation with apixaban.  Now patient on medical ward level of care.   7. AKIwith hypokalemia/ metabolic alkalosis.His renal function has been stable, will continue to follow renal panel as needed, he  is tolerating po well and is off diuretic  therapy.   8. Acute on chronic diastolic heart failure.Improved volume status, patiet has obesity and chronic lower extremity edema, intravascularly euvolemic.   Continue blood pressure and atrial fibrillation control.    Status is: Inpatient  Remains inpatient appropriate because:Unsafe d/c plan   Dispo: The patient is from: Home              Anticipated d/c is to: SNF              Anticipated d/c date is: 1 day              Patient currently is medically stable to d/c.  DVT prophylaxis: apixaban   Code Status:   dnr   Family Communication:  No family at the bedside      Nutrition Status: Nutrition Problem: Inadequate oral intake Etiology: inability to eat Signs/Symptoms: NPO status Interventions: Premier Protein     Skin Documentation: Pressure Injury 05/14/19 Ischial tuberosity Right Stage 2 -  Partial thickness loss of dermis presenting as a shallow open injury with a red, pink wound bed without slough. WOC updated FS to list left and right ischial wounds separately: copied and paste (Active)  05/14/19 3704  Location: Ischial tuberosity  Location Orientation: Right  Staging: Stage 2 -  Partial thickness loss of dermis presenting as a shallow open injury with a red, pink wound bed without slough.  Wound Description (Comments): WOC updated FS to list left and right ischial wounds separately: copied and pasted this note from admitting RN  right black wound about 1 cm round on right buttock cheek. Entire buttock area red non blanchable   Present on Admission: Yes     Pressure Injury 06/18/19 Sacrum Stage 1 -  Intact skin with non-blanchable redness of a localized area usually over a bony prominence. excoriated redness noted to sacrum that extends to perineum and testicles.  (Active)  06/18/19 1309  Location: Sacrum  Location Orientation:   Staging: Stage 1 -  Intact skin with non-blanchable redness of a localized area usually over a bony prominence.  Wound Description  (Comments): excoriated redness noted to sacrum that extends to perineum and testicles.   Present on Admission:       Subjective: Patient is feeling better, continue to be very weak and deconditioned, no nausea or vomiting, no further hallucinations, no agitation.   Objective: Vitals:   10/28/19 0300 10/28/19 0500 10/28/19 0611 10/28/19 0719  BP: 136/83  105/87 122/74  Pulse: (!) 116  95 99  Resp: 20   17  Temp: 98 F (36.7 C)   97.9 F (36.6 C)  TempSrc: Oral   Oral  SpO2: 99%   100%  Weight:  131 kg    Height:        Intake/Output Summary (Last 24 hours) at 10/28/2019 1241 Last data filed at 10/28/2019 0800 Gross per 24 hour  Intake 350 ml  Output 700 ml  Net -350 ml   Filed Weights   10/26/19 0500 10/27/19 0500 10/28/19 0500  Weight: 130 kg 131 kg 131 kg    Examination:   General: Not in pain or dyspnea. Deconditioned  Neurology: Awake and alert, non focal  E ENT: mild pallor, no icterus, oral mucosa moist Cardiovascular: No JVD. S1-S2 present, rhythmic, no gallops, rubs, or murmurs. +++ non pitting lower extremity edema. Pulmonary: vesicular breath sounds bilaterally, adequate air movement, no wheezing, rhonchi or rales. Gastrointestinal. Abdomen protuberant, soft and non  tender Skin. Mild erythema at the inner thighs,  Musculoskeletal: no joint deformities     Data Reviewed: I have personally reviewed following labs and imaging studies  CBC: Recent Labs  Lab 10/22/19 0302 10/23/19 0404 10/24/19 0407  WBC 17.8* 12.7* 10.6*  HGB 9.7* 9.6* 9.1*  HCT 33.4* 34.6* 33.5*  MCV 82.7 87.8 87.9  PLT 333 253 818   Basic Metabolic Panel: Recent Labs  Lab 10/21/19 1423 10/21/19 1423 10/21/19 2124 10/22/19 0302 10/22/19 0302 10/22/19 1901 10/22/19 1901 10/23/19 0404 10/23/19 1839 10/24/19 0407 10/25/19 0417 10/27/19 0300  NA 139   < >  --  140   < > 141   < > 143 141 142 141 139  K 3.6   < >  --  3.5   < > 3.6   < > 3.5 4.0 3.8 3.4* 3.5  CL 101   <  >  --  101   < > 99   < > 98 96* 94* 92* 89*  CO2 27   < >  --  28   < > 33*   < > 35* 34* 38* 40* 41*  GLUCOSE 171*   < >  --  191*   < > 179*   < > 112* 123* 106* 102* 92  BUN 42*   < >  --  44*   < > 40*   < > 36* 36* 32* 24* 24*  CREATININE 2.15*   < >  --  1.66*   < > 1.15   < > 1.10 0.84 0.81 0.67 0.63  CALCIUM 7.7*   < >  --  7.9*   < > 8.0*   < > 8.3* 8.7* 8.9 9.0 9.2  MG 2.0  --  1.9 1.9  --  1.7  --   --   --  1.8  --   --   PHOS  --   --  1.9* 2.0*  --  3.9  --   --   --  4.0  --   --    < > = values in this interval not displayed.   GFR: Estimated Creatinine Clearance: 114.9 mL/min (by C-G formula based on SCr of 0.63 mg/dL). Liver Function Tests: No results for input(s): AST, ALT, ALKPHOS, BILITOT, PROT, ALBUMIN in the last 168 hours. No results for input(s): LIPASE, AMYLASE in the last 168 hours. No results for input(s): AMMONIA in the last 168 hours. Coagulation Profile: No results for input(s): INR, PROTIME in the last 168 hours. Cardiac Enzymes: No results for input(s): CKTOTAL, CKMB, CKMBINDEX, TROPONINI in the last 168 hours. BNP (last 3 results) No results for input(s): PROBNP in the last 8760 hours. HbA1C: No results for input(s): HGBA1C in the last 72 hours. CBG: Recent Labs  Lab 10/27/19 1105 10/27/19 1548 10/27/19 2115 10/28/19 0622 10/28/19 1112  GLUCAP 151* 110* 102* 129* 111*   Lipid Profile: No results for input(s): CHOL, HDL, LDLCALC, TRIG, CHOLHDL, LDLDIRECT in the last 72 hours. Thyroid Function Tests: No results for input(s): TSH, T4TOTAL, FREET4, T3FREE, THYROIDAB in the last 72 hours. Anemia Panel: No results for input(s): VITAMINB12, FOLATE, FERRITIN, TIBC, IRON, RETICCTPCT in the last 72 hours.    Radiology Studies: I have reviewed all of the imaging during this hospital visit personally     Scheduled Meds: . apixaban  5 mg Oral BID  . chlorhexidine gluconate (MEDLINE KIT)  15 mL Mouth Rinse BID  . Chlorhexidine Gluconate Cloth  6 each Topical Daily  . docusate sodium  100 mg Oral BID  . Gerhardt's butt cream   Topical TID  . insulin aspart  0-9 Units Subcutaneous TID WC  . mouth rinse  15 mL Mouth Rinse BID  . metoprolol tartrate  25 mg Oral Q8H  . mupirocin ointment   Nasal BID  . Ensure Max Protein  11 oz Oral BID  . sodium chloride flush  10-40 mL Intracatheter Q12H   Continuous Infusions: . sodium chloride Stopped (10/20/19 1449)     LOS: 9 days        Miller Limehouse Gerome Apley, MD

## 2019-10-28 NOTE — Progress Notes (Signed)
PT Note  Pt screened by myself on 7/16. Pt has been bed bound > 3 years. Has been at SNF at times and home at other times. Has received PT in the past but it doesn't appear that pt has moved beyond the bed bound status during that time. Doubt further PT at this point is going to result in a change in functional status. Likely needs custodial care in a facility.   Georga Hacking [facility] PT Acute Rehabilitation Services Pager [phone] Office [phone]

## 2019-10-29 DIAGNOSIS — I480 Paroxysmal atrial fibrillation: Secondary | ICD-10-CM

## 2019-10-29 LAB — GLUCOSE, CAPILLARY
Glucose-Capillary: 109 mg/dL — ABNORMAL HIGH (ref 70–99)
Glucose-Capillary: 127 mg/dL — ABNORMAL HIGH (ref 70–99)
Glucose-Capillary: 96 mg/dL (ref 70–99)
Glucose-Capillary: 148 mg/dL — ABNORMAL HIGH (ref 70–99)

## 2019-10-29 MED ORDER — JUVEN PO PACK
1.0000 | PACK | Freq: Two times a day (BID) | ORAL | Status: DC
  Administered 2019-10-29 – 2019-11-04 (×11): 1 via ORAL
  Filled 2019-10-29 (×11): qty 1

## 2019-10-29 MED ORDER — ADULT MULTIVITAMIN W/MINERALS CH
1.0000 | ORAL_TABLET | Freq: Every day | ORAL | Status: DC
  Administered 2019-10-30 – 2019-11-04 (×6): 1 via ORAL
  Filled 2019-10-29 (×6): qty 1

## 2019-10-29 NOTE — Progress Notes (Signed)
PROGRESS NOTE    Jonathan Wise  WCB:[id number] DOB: Apr 06, 1946 DOA: 10/19/2019 PCP: Christain [place], MD    Chief Complaint  Patient presents with  . Altered Mental Status    Brief Narrative:  Jonathan Wise is a 74 y.o. male with hx of afib on eliquis, DM, DVT, HTN, admitted at [facility] from SNF with AMS, AKI, with Hyperkalemia, developed hypoxia, hypotension, intubated, started on pressors and transferred here to [facility] ICU.   Patient was admitted to the hospital with working diagnosis of septic shock due to urinary tract infection/right lower lobe pneumonia, present on admission.  Patient received broad-spectrum antibiotic therapy with vancomycin, metronidazole and cefepime. His cultures have been no growth. He was off pressors and  liberated from mechanical ventilation on July 14.  Transferred to [facility] July 16.  Hallucinations and jerking movement has resolved He continues to be very weak and deconditioned he is hoping to go to skilled nursing facility.  Subjective:  C/o rash, rash is red but denies itch, no burning He states has been having rash since he is in the hospital No fever  wife at bedside  Assessment & Plan:   Principal Problem:   Severe sepsis with septic shock ([place]) Active Problems:   Venous stasis ulcers ([place])   Type 2 diabetes mellitus without complication ([place])   Current use of long term anticoagulation   Atrial fibrillation (HCC)   Acute respiratory failure with hypoxia (HCC)   Sepsis secondary to UTI ([place])   Lobar pneumonia ([place])   DVT (deep venous thrombosis) ([place])   1. Septic shock present on admission due to urinary tract infection and right lower lobe pneumonia (small right para-pneumonic effusion).Acute hypoxic respiratory failure required intubation ,present on admission completed antibiotic therapy with cefepime.  Off pressors , extubated . patient has remained hemodynamically stable, no fever, leukocytosis is  resolved   2. Acute metabolic encephalopathy. Present on admission he reports slow memory, but no confusion or hallucinations.  He is very weak and deconditioned bedridden for many years.    3. Controlled, insulin-dependent T2DM, Hgb A1c 5.9. Home meds Tresiba on hold since admission Currently On insulin sliding scale for glucose cover and monitoring.Capillary glucose has remained well controlled 129, 111. He is tolerating po well.   4. HTN.Continue with metoprololfor blood pressure control.  5. 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 His calculated BMI is 38,6. On local wound care, wounds with dressing in place.  6. Chronic atrial fibrillation.Continue rate controlled withmetoprolol  ( has been on lopressor q8hrs)and anticoagulation with apixaban.  Still has intermittent tachycardia, consider adding calcium channel blocker if BP allows  7. AKIwith hypokalemia/ metabolic alkalosis.His renal function has been stable, will continue to follow renal panel as needed, he is tolerating po well and is off diuretic therapy.   8. Acute on chronic diastolic heart failure.Improved volume status, patiet has obesity and chronic lower extremity edema, intravascularly euvolemic.   Continue blood pressure and atrial fibrillation control.   Bilat anterior legs with scattered full thickness stasis ulcers.  Red and moist, small amt pink drainage, surrounded by dry peeling scaley skin.  Measurement: Left leg 4X3X.2cm, .8X.8X.2cm, 1X1X.2cm Right leg 5X5X.2cm and 1X1X.2cm Dressing procedure/placement/frequency: Topical treatment orders provided for bedside nurses to perform as follows to promote healing. Apply xeroform gauze to bilat leg wounds Q day, then cover with ABD pads and kerlex.  Rash, drug reaction vs heat rash, does not itch, no burning, monitor, may  need steroids if persists or worsening   DVT prophylaxis:  apixaban (ELIQUIS)  tablet 5 mg   Code Status: DNR Family Communication: Wife at bedside Disposition:   Status is: Inpatient    Dispo: The patient is from: Skilled nursing facility              Anticipated d/c is to: Skilled nursing facility              Anticipated d/c date is: Awaiting for skilled nursing facility facility bed, monitor heart rate                Consultants:   Wound care  Procedures:   Central line placement  Intubation and extubation  Antimicrobials:   As above     Objective: Vitals:   10/29/19 0647 10/29/19 0741 10/29/19 0742 10/29/19 1122  BP: 140/67  (!) 101/56 (!) 144/82  Pulse: 98  (!) 124 (!) 122  Resp:  19  19  Temp:  98.3 F (36.8 C)  98.2 F (36.8 C)  TempSrc:  Oral  Oral  SpO2:   (!) 88% 98%  Weight:      Height:        Intake/Output Summary (Last 24 hours) at 10/29/2019 1430 Last data filed at 10/29/2019 1400 Gross per 24 hour  Intake 720 ml  Output 400 ml  Net 320 ml   Filed Weights   10/27/19 0500 10/28/19 0500 10/29/19 0638  Weight: 131 kg 131 kg 132 kg    Examination:  General exam: chronically ill appearing, calm, NAD, aaox3 Respiratory system: Clear to auscultation. Respiratory effort normal. Cardiovascular system: S1 & S2 heard, IRRR. No JVD, no murmur, No pedal edema. Gastrointestinal system: Abdomen is nondistended, soft and nontender. No organomegaly or masses felt. Normal bowel sounds heard. Central nervous system: Alert and oriented. No focal neurological deficits. Extremities: generalized weakness Skin: diffuse erythematous rash,  Psychiatry: Judgement and insight appear normal. Mood & affect appropriate.     Data Reviewed: I have personally reviewed following labs and imaging studies  CBC: Recent Labs  Lab 10/23/19 0404 10/24/19 0407  WBC 12.7* 10.6*  HGB 9.6* 9.1*  HCT 34.6* 33.5*  MCV 87.8 87.9  PLT 253 127    Basic Metabolic Panel: Recent Labs  Lab 10/22/19 1901 10/22/19 1901 10/23/19 0404  10/23/19 1839 10/24/19 0407 10/25/19 0417 10/27/19 0300  NA 141   < > 143 141 142 141 139  K 3.6   < > 3.5 4.0 3.8 3.4* 3.5  CL 99   < > 98 96* 94* 92* 89*  CO2 33*   < > 35* 34* 38* 40* 41*  GLUCOSE 179*   < > 112* 123* 106* 102* 92  BUN 40*   < > 36* 36* 32* 24* 24*  CREATININE 1.15   < > 1.10 0.84 0.81 0.67 0.63  CALCIUM 8.0*   < > 8.3* 8.7* 8.9 9.0 9.2  MG 1.7  --   --   --  1.8  --   --   PHOS 3.9  --   --   --  4.0  --   --    < > = values in this interval not displayed.    GFR: Estimated Creatinine Clearance: 115.4 mL/min (by C-G formula based on SCr of 0.63 mg/dL).  Liver Function Tests: No results for input(s): AST, ALT, ALKPHOS, BILITOT, PROT, ALBUMIN in the last 168 hours.  CBG: Recent Labs  Lab 10/28/19 0622 10/28/19 1112 10/28/19 1638  10/29/19 0635 10/29/19 1122  GLUCAP 129* 111* 119* 109* 127*     Recent Results (from the past 240 hour(s))  SARS Coronavirus 2 by RT PCR (hospital order, performed in [facility] hospital lab) Nasopharyngeal Nasopharyngeal Swab     Status: None   Collection Time: 10/19/19  3:52 PM   Specimen: Nasopharyngeal Swab  Result Value Ref Range Status   SARS Coronavirus 2 NEGATIVE NEGATIVE Final    Comment: (NOTE) SARS-CoV-2 target nucleic acids are NOT DETECTED.  The SARS-CoV-2 RNA is generally detectable in upper and lower respiratory specimens during the acute phase of infection. The lowest concentration of SARS-CoV-2 viral copies this assay can detect is 250 copies / mL. A negative result does not preclude SARS-CoV-2 infection and should not be used as the sole basis for treatment or other patient management decisions.  A negative result may occur with improper specimen collection / handling, submission of specimen other than nasopharyngeal swab, presence of viral mutation(s) within the areas targeted by this assay, and inadequate number of viral copies (<250 copies / mL). A negative result must be combined with  clinical observations, patient history, and epidemiological information.  Fact Sheet for Patients:   StrictlyIdeas.no  Fact Sheet for Healthcare Providers: BankingDealers.co.za  This test is not yet approved or  cleared by the [place] FDA and has been authorized for detection and/or diagnosis of SARS-CoV-2 by FDA under an Emergency Use Authorization (EUA).  This EUA will remain in effect (meaning this test can be used) for the duration of the COVID-19 declaration under Section 564(b)(1) of the Act, 21 U.S.C. section 360bbb-3(b)(1), unless the authorization is terminated or revoked sooner.  Performed at [facility], [address], [place], [place] [address]   Culture, blood (Routine X 2) w Reflex to ID Panel     Status: None   Collection Time: 10/19/19  4:08 PM   Specimen: BLOOD RIGHT HAND  Result Value Ref Range Status   Specimen Description BLOOD RIGHT HAND  Final   Special Requests   Final    BOTTLES DRAWN AEROBIC ONLY Blood Culture results may not be optimal due to an inadequate volume of blood received in culture bottles   Culture   Final    NO GROWTH 5 DAYS Performed at [facility] [place], [address] [place], [place] [address]    Report Status 10/24/2019 FINAL  Final  Culture, blood (Routine X 2) w Reflex to ID Panel     Status: None   Collection Time: 10/19/19  4:12 PM   Specimen: BLOOD LEFT HAND  Result Value Ref Range Status   Specimen Description BLOOD LEFT HAND  Final   Special Requests   Final    BOTTLES DRAWN AEROBIC AND ANAEROBIC Blood Culture adequate volume   Culture   Final    NO GROWTH 5 DAYS Performed at [facility], [address], [place], [place] [address]    Report Status 10/24/2019 FINAL  Final  MRSA PCR Screening     Status: Abnormal   Collection Time: 10/20/19  2:39 AM   Specimen: Nasopharyngeal  Result Value Ref Range Status   MRSA by PCR POSITIVE (A) NEGATIVE Final    Comment:        The  GeneXpert MRSA Assay (FDA approved for NASAL specimens only), is one component of a comprehensive MRSA colonization surveillance program. It is not intended to diagnose MRSA infection nor to guide or monitor treatment for MRSA infections. RESULT CALLED TO, READ BACK BY AND  VERIFIED WITH: YOUSEF,M RN 10/20/2019 AT 0354 SKEEN,P Performed at [facility], [place] [address], [place], [place] [address]          Radiology Studies: No results found.      Scheduled Meds: . apixaban  5 mg Oral BID  . chlorhexidine gluconate (MEDLINE KIT)  15 mL Mouth Rinse BID  . Chlorhexidine Gluconate Cloth  6 each Topical Daily  . docusate sodium  100 mg Oral BID  . Gerhardt's butt cream   Topical TID  . insulin aspart  0-9 Units Subcutaneous TID WC  . mouth rinse  15 mL Mouth Rinse BID  . metoprolol tartrate  25 mg Oral Q8H  . [START ON 10/30/2019] multivitamin with minerals  1 tablet Oral Daily  . mupirocin ointment   Nasal BID  . nutrition supplement (JUVEN)  1 packet Oral BID BM  . Ensure Max Protein  11 oz Oral BID  . sodium chloride flush  10-40 mL Intracatheter Q12H   Continuous Infusions: . sodium chloride Stopped (10/20/19 1449)     LOS: 10 days     Time spent: 67mns I have personally reviewed and interpreted on  10/29/2019 daily labs, tele strips, imagings as discussed above under date review session and assessment and plans.  I reviewed all nursing notes, pharmacy notes, consultant notes,  vitals, pertinent old records  I have discussed plan of care as described above with RN , patient and family on 10/29/2019  Voice Recognition /Dragon dictation system was used to create this note, attempts have been made to correct errors. Please contact the author with questions and/or clarifications.   FFlorencia Reasons MD PhD FACP [facility]  Available via Epic secure chat 7am-7pm for nonurgent issues Please page for urgent issues To page the attending provider between 7A-7P  or the covering provider during after hours 7P-7A, please log into the web site [email or web address] and access using universal [place] password for that web site. If you do not have the password, please call the hospital operator.    10/29/2019, 2:30 PM

## 2019-10-29 NOTE — TOC Progression Note (Signed)
Transition of Care [facility]) - Progression Note    Patient Details  Name: Jonathan Wise MRN: [id number] Date of Birth: 1946-03-24  Transition of [facility] CM/SW Contact  Okey Dupre Lazaro Arms, LCSW Phone Number: 10/29/2019, 5:17 PM  Clinical Narrative:  CSW reviewed PT notes during preparation to send patient's information out to skilled facilities.  PT's 7/20 note states: "Pt screened by myself on 7/16. Pt has been bed bound > 3 years. Has been at SNF at times and home at other times. Has received PT in the past but it doesn't appear that pt has moved beyond the bed bound status during that time. Doubt further PT at this point is going to result in a change in functional status. Likely needs custodial care in a facility."  CSW talked with MD (5:11 pm) regarding PT note and that insurance will not authorize without therapy notes that indicate the need for PT. MD asked that CSW speak with patient and was advised that follow-up will be made with patient on 7/22.   Expected Discharge Plan: Skilled Nursing Facility Barriers to Discharge: English as a second language teacher, Continued Medical Work up  Expected Discharge Plan and Services Expected Discharge Plan: Skilled Nursing Facility       Living arrangements for the past 2 months: Single Family Home                                     Social Determinants of Health (SDOH) Interventions    Readmission Risk Interventions No flowsheet data found.

## 2019-10-29 NOTE — Plan of Care (Signed)
  Problem: Education: Goal: Knowledge of General Education information will improve Description: Including pain rating scale, medication(s)/side effects and non-pharmacologic comfort measures Outcome: Progressing   Problem: Health Behavior/Discharge Planning: Goal: Ability to manage health-related needs will improve Outcome: Progressing   Problem: Clinical Measurements: Goal: Ability to maintain clinical measurements within normal limits will improve Outcome: Progressing Goal: Will remain free from infection Outcome: Progressing Goal: Diagnostic test results will improve Outcome: Progressing Goal: Respiratory complications will improve Outcome: Progressing Goal: Cardiovascular complication will be avoided Outcome: Progressing   Problem: Coping: Goal: Level of anxiety will decrease Outcome: Progressing   Problem: Elimination: Goal: Will not experience complications related to bowel motility Outcome: Progressing Goal: Will not experience complications related to urinary retention Outcome: Progressing   Problem: Pain Managment: Goal: General experience of comfort will improve Outcome: Progressing   Problem: Safety: Goal: Ability to remain free from injury will improve Outcome: Progressing   

## 2019-10-29 NOTE — Progress Notes (Signed)
Nutrition Follow-up  RD working remotely.  DOCUMENTATION CODES:   Morbid obesity  INTERVENTION:   - Ensure Max po BID, each supplement provides 150 kcal and 30 grams of protein  - Agree with Regular diet order  - Magic cup TID with meals, each supplement provides 290 kcal and 9 grams of protein  - 1 packet Juven BID, each packet provides 95 calories, 2.5 grams of protein, and 9.8 grams of carbohydrate; also contains of L-arginine and L-glutamine, vitamin C, vitamin E, vitamin B-12, zinc, calcium, and calcium Beta-hydroxy-Beta-methylbutyrate to support wound healing  - MVI with minerals daily  - Encourage adequate PO intake  NUTRITION DIAGNOSIS:   Inadequate oral intake related to inability to eat as evidenced by NPO status.  Progressing, pt now on Regular diet  GOAL:   Patient will meet greater than or equal to 90% of their needs  Progressing  MONITOR:   PO intake, Supplement acceptance, Labs, Weight trends, Skin, I & O's  REASON FOR ASSESSMENT:   Consult Enteral/tube feeding initiation and management  ASSESSMENT:   74 year old male who presented on 7/11 with AMS. PMH of atrial fibrillation, T2DM, DVT, HTN, HLD, bedbound with chronic LE wounds. Pt admitted with AKI, hyperkalemia, septic shock and required intubation due to acute hypoxic respiratory failure.  7/14 - extubated, diet advanced to soft  Noted plan for pt to d/c to SNF.  Unable to reach pt via phone call to room.  Current weight still above admit weight. However, pt with mild pitting generalized edema and mild pitting edema to BUE and BLE. Weight has been stable over the 6 days.  Pt accepting ~75% of Ensure Max supplements. RD will add Magic Cups with meals to aid in meeting kcal and protein needs via PO intake. Will also add Juven BID to aid in wound healing.  Current weight: 132 kg Admit weight: 119.7 kg  Meal Completion: 20-75%  Medications reviewed and include: colace, SSI, Ensure Max  BID  Labs reviewed: hemoglobin 9.1 CBG's: 109-127 x 24 hours  Diet Order:   Diet Order            Diet regular Room service appropriate? Yes; Fluid consistency: Thin  Diet effective now                 EDUCATION NEEDS:   No education needs have been identified at this time  Skin:  Skin Assessment: Skin Integrity Issues: Stage I: sacrum Stage II: right ischial tuberosity Unstageable: left buttock Other: cellulitis to BLE; MASD to abd/groin  Last BM:  10/27/19  Height:   Ht Readings from Last 1 Encounters:  10/23/19 6\' 1"  (1.854 m)    Weight:   Wt Readings from Last 1 Encounters:  10/29/19 132 kg    Ideal Body Weight:  75.5 kg  BMI:  Body mass index is 38.39 kg/m.  Estimated Nutritional Needs:   Kcal:  2100-2300  Protein:  110-130 grams  Fluid:  >/= 2.0 L    10/31/19, MS, RD, LDN Inpatient Clinical Dietitian Please see AMiON for contact information.

## 2019-10-29 NOTE — Progress Notes (Signed)
Granddaughter Ladona Ridgel called and want some information about the pt and who wants the MD to call her. Asked the pt. for the permission to  give her the information but denied it. Claimed " I will talk to her" . The latter made aware about the decision of the pt.

## 2019-10-30 LAB — GLUCOSE, CAPILLARY
Glucose-Capillary: 144 mg/dL — ABNORMAL HIGH (ref 70–99)
Glucose-Capillary: 161 mg/dL — ABNORMAL HIGH (ref 70–99)
Glucose-Capillary: 184 mg/dL — ABNORMAL HIGH (ref 70–99)
Glucose-Capillary: 301 mg/dL — ABNORMAL HIGH (ref 70–99)

## 2019-10-30 LAB — CBC WITH DIFFERENTIAL/PLATELET
Abs Immature Granulocytes: 0.03 10*3/uL (ref 0.00–0.07)
Basophils Absolute: 0.1 10*3/uL (ref 0.0–0.1)
Basophils Relative: 1 %
Eosinophils Absolute: 0.5 10*3/uL (ref 0.0–0.5)
Eosinophils Relative: 5 %
HCT: 36 % — ABNORMAL LOW (ref 39.0–52.0)
Hemoglobin: 10 g/dL — ABNORMAL LOW (ref 13.0–17.0)
Immature Granulocytes: 0 %
Lymphocytes Relative: 12 %
Lymphs Abs: 1.4 10*3/uL (ref 0.7–4.0)
MCH: 24.9 pg — ABNORMAL LOW (ref 26.0–34.0)
MCHC: 27.8 g/dL — ABNORMAL LOW (ref 30.0–36.0)
MCV: 89.6 fL (ref 80.0–100.0)
Monocytes Absolute: 1.2 10*3/uL — ABNORMAL HIGH (ref 0.1–1.0)
Monocytes Relative: 11 %
Neutro Abs: 8.1 10*3/uL — ABNORMAL HIGH (ref 1.7–7.7)
Neutrophils Relative %: 71 %
Platelets: 227 10*3/uL (ref 150–400)
RBC: 4.02 MIL/uL — ABNORMAL LOW (ref 4.22–5.81)
RDW: 16.7 % — ABNORMAL HIGH (ref 11.5–15.5)
WBC: 11.3 10*3/uL — ABNORMAL HIGH (ref 4.0–10.5)
nRBC: 0 % (ref 0.0–0.2)

## 2019-10-30 LAB — COMPREHENSIVE METABOLIC PANEL
ALT: 24 U/L (ref 0–44)
AST: 24 U/L (ref 15–41)
Albumin: 2.6 g/dL — ABNORMAL LOW (ref 3.5–5.0)
Alkaline Phosphatase: 72 U/L (ref 38–126)
Anion gap: 7 (ref 5–15)
BUN: 22 mg/dL (ref 8–23)
CO2: 41 mmol/L — ABNORMAL HIGH (ref 22–32)
Calcium: 8.9 mg/dL (ref 8.9–10.3)
Chloride: 92 mmol/L — ABNORMAL LOW (ref 98–111)
Creatinine, Ser: 0.72 mg/dL (ref 0.61–1.24)
GFR calc Af Amer: >60 mL/min (ref >60)
GFR calc non Af Amer: >60 mL/min (ref >60)
Glucose, Bld: 156 mg/dL — ABNORMAL HIGH (ref 70–99)
Potassium: 3.8 mmol/L (ref 3.5–5.1)
Sodium: 140 mmol/L (ref 135–145)
Total Bilirubin: 0.6 mg/dL (ref 0.3–1.2)
Total Protein: 6 g/dL — ABNORMAL LOW (ref 6.5–8.1)

## 2019-10-30 LAB — MAGNESIUM: Magnesium: 1.6 mg/dL — ABNORMAL LOW (ref 1.7–2.4)

## 2019-10-30 MED ORDER — SODIUM CHLORIDE 0.9 % IV BOLUS: 500.0000 mL | Freq: Once | INTRAVENOUS | Status: DC

## 2019-10-30 MED ORDER — MAGNESIUM SULFATE 2 GM/50ML IV SOLN
2.0000 g | Freq: Once | INTRAVENOUS | Status: AC
  Administered 2019-10-30: 2 g via INTRAVENOUS
  Filled 2019-10-30: qty 50

## 2019-10-30 MED ORDER — METHYLPREDNISOLONE SODIUM SUCC 125 MG IJ SOLR
60.0000 mg | Freq: Once | INTRAMUSCULAR | Status: AC
  Administered 2019-10-30: 60 mg via INTRAVENOUS
  Filled 2019-10-30: qty 2

## 2019-10-30 MED ORDER — INSULIN GLARGINE 100 UNIT/ML ~~LOC~~ SOLN
8.0000 [IU] | Freq: Every day | SUBCUTANEOUS | Status: DC
  Administered 2019-10-30 – 2019-10-31 (×3): 8 [IU] via SUBCUTANEOUS
  Filled 2019-10-30 (×4): qty 0.08

## 2019-10-30 MED ORDER — INSULIN ASPART 100 UNIT/ML ~~LOC~~ SOLN
2.0000 [IU] | Freq: Three times a day (TID) | SUBCUTANEOUS | Status: DC
  Administered 2019-10-30 – 2019-10-31 (×2): 2 [IU] via SUBCUTANEOUS

## 2019-10-30 MED ORDER — METOPROLOL TARTRATE 12.5 MG HALF TABLET
12.5000 mg | ORAL_TABLET | Freq: Two times a day (BID) | ORAL | Status: DC
  Administered 2019-10-30 – 2019-11-04 (×10): 12.5 mg via ORAL
  Filled 2019-10-30 (×10): qty 1

## 2019-10-30 NOTE — Consult Note (Signed)
WOC Nurse Consult Note: Telephone consultation with Dr. Roda Shutters to discuss optimal care for bilateral LEs in a bed bound patient. He is known to our service and was last seen by my associate, D. Engles on 7/16. The recommendation for daily care to the bilateral LEs is selected for daily cleansing of the skin and removal of dead tissue, moisturizing, topical care to wounds using an antimicrobial and nonadherent gauze (xeroform) topped with ABD pads, secured with Kerlix roll gauze/paper tape is still appropriate.  WOC nursing team will not follow, but will remain available to this patient, the nursing and medical teams.  Please re-consult if needed. Thanks, Ladona Mow, MSN, RN, GNP, [place]  Pager# [phone]

## 2019-10-30 NOTE — NC FL2 (Signed)
[place] LEVEL OF CARE SCREENING TOOL     IDENTIFICATION  Patient Name: Jonathan Wise Birthdate: 1945/10/18 Sex: male Admission Date (Current Location): 10/19/2019  [facility] and [place] Number:  Herbalist and Address:  [facility], [place] [address], [place], [place] [address]      Provider Number: [id number]  Attending Physician Name and Address:  Florencia Reasons, MD  Relative Name and Phone Number:       Current Level of Care: Hospital Recommended Level of Care: [place] Prior Approval Number:    Date Approved/Denied:   PASRR Number: [id number]  Discharge Plan: SNF    Current Diagnoses: Patient Active Problem List   Diagnosis Date Noted  . Severe sepsis with septic shock ([place]) 10/24/2019  . Sepsis secondary to UTI ([place]) 10/24/2019  . Lobar pneumonia ([place]) 10/24/2019  . DVT (deep venous thrombosis) ([place]) 10/24/2019  . Severe sepsis ([place]) 10/19/2019  . Sepsis ([place]) 10/19/2019  . Pressure injury of skin 06/19/2019  . Chronic painful diabetic neuropathy ([place]) 05/14/2019  . Bedbound 05/14/2019  . Former heavy tobacco smoker 05/14/2019  . Atrial fibrillation with rapid ventricular response ([place]) 05/13/2019  . Chronic osteoarthritis 01/01/2017  . Lower extremity weakness 01/01/2017  . Acute respiratory failure with hypoxia ([place]) 12/28/2016  . Anemia of chronic disease 12/25/2015  . [place] intertrigo 12/22/2015  . 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 Bradycardia 03/15/2014  . Lactic acidosis 03/15/2014  . Atrial fibrillation ([place]) 06/23/2012  . PVC's (premature ventricular contractions) 06/23/2012  . Cellulitis of leg, left 06/22/2012  . Sepsis due to pneumonia ([place]) 06/22/2012  . Morbid obesity ([place]) 06/22/2012  . Osteoarthritis of right knee 06/22/2012  . Current use of long term anticoagulation 06/22/2012  . Pulmonary artery hypertension ([place]) 10/18/2011  . Bilateral lower extremity edema  10/14/2011  . Hypotension 10/14/2011  . Venous stasis ulcers ([place]) 10/14/2011  . Type 2 diabetes mellitus without complication ([place]) 73/71/0626    Orientation RESPIRATION BLADDER Height & Weight     Self, Time, Situation, Place  Normal Incontinent Weight: 293 lb 3.4 oz (133 kg) Height:  6' 1"  (185.4 cm)  BEHAVIORAL SYMPTOMS/MOOD NEUROLOGICAL BOWEL NUTRITION STATUS      Continent Diet (see discharge summary)  AMBULATORY STATUS COMMUNICATION OF NEEDS Skin   Total Care Verbally PU Stage and Appropriate Care, Other (Comment) (stage 1 pu on sacrum; venous statis ulcers on legs w/ ABD, gauze, petroleum; MASD on groin)                       Personal Care Assistance Level of Assistance  Bathing, Feeding, Dressing Bathing Assistance: Maximum assistance Feeding assistance: Limited assistance Dressing Assistance: Maximum assistance     Functional Limitations Info  Sight, Hearing, Speech Sight Info: Adequate Hearing Info: Adequate Speech Info: Adequate    SPECIAL CARE FACTORS FREQUENCY  PT (By licensed PT), OT (By licensed OT)     PT Frequency: 5x week OT Frequency: 5x week            Contractures Contractures Info: Not present    Additional Factors Info  Code Status, Allergies, Insulin Sliding Scale Code Status Info: DNR Allergies Info: No Known Allergies   Insulin Sliding Scale Info: insulin aspart (novoLOG) injection 0-9 Units 3x daily with meals; insulin aspart (novoLOG) injection 2 Units 3x dialy with meals; insulin glargine (LANTUS) injection 8 Units daily at bedtime       Current Medications (10/30/2019):  This is the current hospital active medication list Current Facility-Administered Medications  Medication Dose Route Frequency Provider Last Rate Last Admin  . 0.9 %  sodium chloride infusion   Intra-arterial PRN Collier Bullock, MD   Stopped at 10/20/19 1449  . acetaminophen (TYLENOL) tablet 650 mg  650 mg Oral Q6H PRN Gleason, Otilio Carpen, PA-C       Or  .  acetaminophen (TYLENOL) suppository 650 mg  650 mg Rectal Q6H PRN Gleason, Otilio Carpen, PA-C      . apixaban (ELIQUIS) tablet 5 mg  5 mg Oral BID Madalyn Rob, MD   5 mg at 10/30/19 0949  . chlorhexidine gluconate (MEDLINE KIT) (PERIDEX) 0.12 % solution 15 mL  15 mL Mouth Rinse BID Icard, Bradley L, DO   15 mL at 10/29/19 1938  . docusate sodium (COLACE) capsule 100 mg  100 mg Oral BID PRN Gleason, Otilio Carpen, PA-C      . docusate sodium (COLACE) capsule 100 mg  100 mg Oral BID Icard, Bradley L, DO   100 mg at 10/29/19 0900  . Gerhardt's butt cream   Topical TID Tawni Millers, MD   Given at 10/30/19 [phone]  . insulin aspart (novoLOG) injection 0-9 Units  0-9 Units Subcutaneous TID WC Arrien, Jimmy Picket, MD   2 Units at 10/30/19 1209  . insulin aspart (novoLOG) injection 2 Units  2 Units Subcutaneous TID WC Florencia Reasons, MD      . insulin glargine (LANTUS) injection 8 Units  8 Units Subcutaneous QHS Florencia Reasons, MD      . MEDLINE mouth rinse  15 mL Mouth Rinse BID Icard, Bradley L, DO   15 mL at 10/30/19 0952  . metoprolol tartrate (LOPRESSOR) tablet 12.5 mg  12.5 mg Oral BID Florencia Reasons, MD      . multivitamin with minerals tablet 1 tablet  1 tablet Oral Daily Florencia Reasons, MD   1 tablet at 10/30/19 [phone]  . mupirocin ointment (BACTROBAN) 2 %   Nasal BID Icard, Octavio Graves, DO   Given at 10/30/19 0951  . nutrition supplement (JUVEN) (JUVEN) powder packet 1 packet  1 packet Oral BID BM Florencia Reasons, MD   1 packet at 10/30/19 1528  . ondansetron (ZOFRAN) tablet 4 mg  4 mg Oral Q6H PRN Gleason, Otilio Carpen, PA-C       Or  . ondansetron [facility]) injection 4 mg  4 mg Intravenous Q6H PRN Gleason, Otilio Carpen, PA-C   4 mg at 10/26/19 0514  . polyethylene glycol (MIRALAX / GLYCOLAX) packet 17 g  17 g Oral Daily PRN Gleason, Otilio Carpen, PA-C      . protein supplement (ENSURE MAX) liquid  11 oz Oral BID Icard, Bradley L, DO   11 oz at 10/30/19 0949  . sodium chloride 0.9 % bolus 500 mL  500 mL Intravenous Once Florencia Reasons, MD      .  sodium chloride flush (NS) 0.9 % injection 10-40 mL  10-40 mL Intracatheter Q12H Icard, Bradley L, DO   10 mL at 10/30/19 0950  . sodium chloride flush (NS) 0.9 % injection 10-40 mL  10-40 mL Intracatheter PRN Icard, Octavio Graves, DO         Discharge Medications: Please see discharge summary for a list of discharge medications.  Relevant Imaging Results:  Relevant Lab Results:   Additional Information SS# [id number]  Alexander Mt, LCSW

## 2019-10-30 NOTE — TOC Progression Note (Signed)
Transition of Care [facility]) - Progression Note    Patient Details  Name: Jonathan Wise MRN: [id number] Date of Birth: Apr 05, 1946  [facility]) CM/SW Contact  Doy Hutching, [place] Phone Number: 10/30/2019, 4:48 PM  Clinical Narrative:    CSW spoke with pt granddaughter Jonathan Wise on RN phone with pt permission. Pt granddaughter would like to place pt at SNF. We discussed that it would be a STR placement as pt does not have Medicaid. We discussed that due to pt bedbound status that insurance may not approve pt SNF placement. Jonathan Wise states they have worked on [employer] and would like [place] to at least try as caregiving has been difficult on pt wife. Pt has two CNAs that assist at home but family cannot expand hours due to financial concerns. TOC team will submit insurance auth under medical request for wound care. Will f/u with family.   Granddaughter present today during conversation is Jonathan Wise [phone])   Expected Discharge Plan: Skilled Nursing Facility Barriers to Discharge: Insurance Authorization, Continued Medical Work up  Expected Discharge Plan and Services Expected Discharge Plan: Skilled Nursing Facility Living arrangements for the past 2 months: Single Family Home                  Readmission Risk Interventions Readmission Risk Prevention Plan 10/30/2019  Transportation Screening Complete  PCP or Specialist Appt within 5-7 Days Not Complete  Not Complete comments disposition pending  Home Care Screening Complete  Medication Review (RN CM) Referral to Pharmacy  Some recent data might be hidden

## 2019-10-30 NOTE — Progress Notes (Addendum)
PROGRESS NOTE    Jonathan Wise  ZCH:[id number] DOB: 08/23/1945 DOA: 10/19/2019 PCP: Christain [place], MD    Chief Complaint  Patient presents with   Altered Mental Status    Brief Narrative:  Jonathan Wise is a 74 y.o. male with hx of afib on eliquis, DM, DVT, HTN, admitted at [facility] from SNF with AMS, AKI, with Hyperkalemia, developed hypoxia, hypotension, intubated, started on pressors and transferred here to [facility] ICU.   Patient was admitted to the hospital with working diagnosis of septic shock due to urinary tract infection/right lower lobe pneumonia, present on admission.  Patient received broad-spectrum antibiotic therapy with vancomycin, metronidazole and cefepime. His cultures have been no growth. He was off pressors and  liberated from mechanical ventilation on July 14.  Transferred to [facility] July 16.  Hallucinations and jerking movement has resolved He continues to be very weak and deconditioned he is hoping to go to skilled nursing facility.  Subjective:  C/o rash got worse, rash is red but denies itch, no burning No fever bp low normal , does not appear symptomatic  wife at bedside  Assessment & Plan:   Principal Problem:   Severe sepsis with septic shock ([place]) Active Problems:   Venous stasis ulcers (HCC)   Type 2 diabetes mellitus without complication ([place])   Current use of long term anticoagulation   Atrial fibrillation (HCC)   Acute respiratory failure with hypoxia (HCC)   Sepsis secondary to UTI ([place])   Lobar pneumonia (HCC)   DVT (deep venous thrombosis) (HCC)   Septic shock present on admission due to urinary tract infection and right lower lobe pneumonia (small right para-pneumonic effusion).Acute hypoxic respiratory failure required intubation ,present on admission completed antibiotic therapy with cefepime.  Off pressors , extubated . patient has remained hemodynamically stable, no fever, leukocytosis is resolved   Acute  metabolic encephalopathy. Present on admission he reports slow memory, but no confusion or hallucinations.  Today he is very pleasant, alert and oriented.   Chronic atrial fibrillation.Continue rate controlled withmetoprolol  ( has been on lopressor q8hrs)and anticoagulation with apixaban. Still has intermittent tachycardia, consider adding calcium channel blocker if BP allows He reports Dr Einar Gip is his cardiologist.  Acute on chronic diastolic heart failure.Chronic hypoxic respiratory failure on home oxygen. Received metolazone and IV Lasix initially ,improved volume status, patiet has obesity and chronic lower extremity edema, intravascularly euvolemic.  Continue blood pressure and atrial fibrillation control.   Monitor volume status.  Bilat anterior legs with scattered full thickness stasis ulcers (present on admission)  Red and moist, small amt pink drainage, surrounded by dry peeling scaley skin.  Measurement: Left leg 4X3X.2cm, .8X.8X.2cm, 1X1X.2cm Right leg 5X5X.2cm and 1X1X.2cm Dressing procedure/placement/frequency: Topical treatment orders provided for bedside nurses to perform as follows to promote healing. Apply xeroform gauze to bilat leg wounds Q day, then cover with ABD pads and kerlex.  Per wound care recommendation: Wound need to be observed daily for the next 3 weeks ,wash legs with soap and water daily, then dressing change daily as directed above, dressing changes for both legs will take more than 30 minutes every day. Patient would benefit skilled nursing facility placement for wound care.  Social worker consulted.  AKIwith hypokalemia/ metabolic alkalosis.His renal function has normalized will continue to follow renal panel as needed, he is tolerating po well and is off diuretic therapy.   Hypomagnesemia  IV mag today   Controlled, insulin-dependent T2DM, Hgb A1c 5.9. Home meds  Tyler Aas on hold since admission Has been On insulin sliding scale for  glucose cover  Restart long acting insulin and add on meal coverage insulin since starting steroid for skin rash      HTN. Blood pressure low normal today , he does not appear this to be symptomatic  continue low-dosemetoprololwith holding parameters   Obesity class II  Body mass index is 38.68 kg/m.  Rash, drug reaction vs heat rash, does not itch, no burning, monitor, persists and got worse, diffuse on back , bilateral flank, bilateral groin, inner thighs Start solumedrol x1 ,  Monitor effect  FTT; bed to wheel chair bound, need Hoyer lift to get out of bed.   DVT prophylaxis:  apixaban (ELIQUIS) tablet 5 mg   Code Status: DNR Family Communication: Wife at bedside Disposition:   Status is: Inpatient    Dispo: The patient is from: Skilled nursing facility              Anticipated d/c is to: Skilled nursing facility              Anticipated d/c date is: Awaiting for skilled nursing facility facility bed, monitor heart rate                Consultants:   Wound care  Procedures:   Central line placement  Intubation and extubation  Antimicrobials:   As above     Objective: Vitals:   10/30/19 0644 10/30/19 0754 10/30/19 0955 10/30/19 1212  BP: 101/89 (!) 88/76 102/64 (!) 109/92  Pulse:    99  Resp:    19  Temp:  97.8 F (36.6 C)  97.9 F (36.6 C)  TempSrc:  Oral  Oral  SpO2:  100%    Weight:      Height:        Intake/Output Summary (Last 24 hours) at 10/30/2019 1448 Last data filed at 10/30/2019 0400 Gross per 24 hour  Intake 100 ml  Output 550 ml  Net -450 ml   Filed Weights   10/28/19 0500 10/29/19 0638 10/30/19 0634  Weight: 131 kg 132 kg 133 kg    Examination:  General exam: chronically ill appearing, calm, NAD, aaox3 Respiratory system: Clear to auscultation. Respiratory effort normal. Cardiovascular system: S1 & S2 heard, IRRR. No JVD, no murmur, No pedal edema. Gastrointestinal system: Abdomen is nondistended, soft and nontender.  No organomegaly or masses felt. Normal bowel sounds heard. Central nervous system: Alert and oriented. No focal neurological deficits. Extremities: generalized weakness Skin: diffuse erythematous rash, bilateral lower extremity venus stasis changes, see pic below Psychiatry: Judgement and insight appear normal. Mood & affect appropriate.    Right leg, pic taken on 7/22   Left leg , pic taken on 7/22    Data Reviewed: I have personally reviewed following labs and imaging studies  CBC: Recent Labs  Lab 10/24/19 0407 10/30/19 0231  WBC 10.6* 11.3*  NEUTROABS  --  8.1*  HGB 9.1* 10.0*  HCT 33.5* 36.0*  MCV 87.9 89.6  PLT 250 643    Basic Metabolic Panel: Recent Labs  Lab 10/23/19 1839 10/24/19 0407 10/25/19 0417 10/27/19 0300 10/30/19 0231  NA 141 142 141 139 140  K 4.0 3.8 3.4* 3.5 3.8  CL 96* 94* 92* 89* 92*  CO2 34* 38* 40* 41* 41*  GLUCOSE 123* 106* 102* 92 156*  BUN 36* 32* 24* 24* 22  CREATININE 0.84 0.81 0.67 0.63 0.72  CALCIUM 8.7* 8.9 9.0 9.2 8.9  MG  --  1.8  --   --  1.6*  PHOS  --  4.0  --   --   --     GFR: Estimated Creatinine Clearance: 115.8 mL/min (by C-G formula based on SCr of 0.72 mg/dL).  Liver Function Tests: Recent Labs  Lab 10/30/19 0231  AST 24  ALT 24  ALKPHOS 72  BILITOT 0.6  PROT 6.0*  ALBUMIN 2.6*    CBG: Recent Labs  Lab 10/29/19 1122 10/29/19 1700 10/29/19 2118 10/30/19 0631 10/30/19 1106  GLUCAP 127* 96 148* 144* 161*     No results found for this or any previous visit (from the past 240 hour(s)).       Radiology Studies: No results found.      Scheduled Meds:  apixaban  5 mg Oral BID   chlorhexidine gluconate (MEDLINE KIT)  15 mL Mouth Rinse BID   Chlorhexidine Gluconate Cloth  6 each Topical Daily   docusate sodium  100 mg Oral BID   Gerhardt's butt cream   Topical TID   insulin aspart  0-9 Units Subcutaneous TID WC   insulin aspart  2 Units Subcutaneous TID WC   insulin glargine  8  Units Subcutaneous QHS   mouth rinse  15 mL Mouth Rinse BID   methylPREDNISolone (SOLU-MEDROL) injection  60 mg Intravenous Once   metoprolol tartrate  12.5 mg Oral BID   multivitamin with minerals  1 tablet Oral Daily   mupirocin ointment   Nasal BID   nutrition supplement (JUVEN)  1 packet Oral BID BM   Ensure Max Protein  11 oz Oral BID   sodium chloride flush  10-40 mL Intracatheter Q12H   Continuous Infusions:  sodium chloride Stopped (10/20/19 1449)   magnesium sulfate bolus IVPB     sodium chloride       LOS: 11 days     Time spent: 59mns I have personally reviewed and interpreted on  10/30/2019 daily labs, tele strips, imagings as discussed above under date review session and assessment and plans.  I reviewed all nursing notes, pharmacy notes, consultant notes,  vitals, pertinent old records  I have discussed plan of care as described above with RN , patient and family on 10/30/2019  Voice Recognition /Dragon dictation system was used to create this note, attempts have been made to correct errors. Please contact the author with questions and/or clarifications.   FFlorencia Reasons MD PhD FACP [facility]  Available via Epic secure chat 7am-7pm for nonurgent issues Please page for urgent issues To page the attending provider between 7A-7P or the covering provider during after hours 7P-7A, please log into the web site [email or web address] and access using universal [place] password for that web site. If you do not have the password, please call the hospital operator.    10/30/2019, 2:48 PM

## 2019-10-31 LAB — GLUCOSE, CAPILLARY
Glucose-Capillary: 202 mg/dL — ABNORMAL HIGH (ref 70–99)
Glucose-Capillary: 177 mg/dL — ABNORMAL HIGH (ref 70–99)
Glucose-Capillary: 193 mg/dL — ABNORMAL HIGH (ref 70–99)
Glucose-Capillary: 282 mg/dL — ABNORMAL HIGH (ref 70–99)

## 2019-10-31 LAB — SARS CORONAVIRUS 2 BY RT PCR (HOSPITAL ORDER, PERFORMED IN ~~LOC~~ HOSPITAL LAB): SARS Coronavirus 2: NEGATIVE

## 2019-10-31 MED ORDER — METHYLPREDNISOLONE SODIUM SUCC 125 MG IJ SOLR
60.0000 mg | Freq: Once | INTRAMUSCULAR | Status: AC
  Administered 2019-10-31: 60 mg via INTRAVENOUS
  Filled 2019-10-31: qty 2

## 2019-10-31 MED ORDER — PREDNISONE 50 MG PO TABS
50.0000 mg | ORAL_TABLET | Freq: Every day | ORAL | Status: DC
  Administered 2019-11-01: 50 mg via ORAL
  Filled 2019-10-31: qty 1

## 2019-10-31 MED ORDER — INSULIN ASPART 100 UNIT/ML ~~LOC~~ SOLN
3.0000 [IU] | Freq: Three times a day (TID) | SUBCUTANEOUS | Status: DC
  Administered 2019-10-31 – 2019-11-01 (×2): 3 [IU] via SUBCUTANEOUS

## 2019-10-31 NOTE — TOC Progression Note (Addendum)
Transition of Care [facility]) - Progression Note    Patient Details  Name: PRATHER FAILLA MRN: [id number] Date of Birth: 1946/03/10  Transition of Care [facility]) CM/SW Contact  Doy Hutching, [place] Phone Number: 10/31/2019, 12:24 PM  Clinical Narrative:    Pt provided CMS ratings by this writers colleague Miranda. Pt has chosen [facility]. Janie in admissions aware, (pending ref #[id number]), confirmed with [facility] Medicare that clinicals have been received.   Have secure messaged with Dr. Roda Shutters, pt COVID swab has been ordered.    Expected Discharge Plan: Skilled Nursing Facility Barriers to Discharge: English as a second language teacher, Continued Medical Work up  Expected Discharge Plan and Services Expected Discharge Plan: Skilled Nursing Facility Living arrangements for the past 2 months: Single Family Home  Readmission Risk Interventions Readmission Risk Prevention Plan 10/30/2019  Transportation Screening Complete  PCP or Specialist Appt within 5-7 Days Not Complete  Not Complete comments disposition pending  Home Care Screening Complete  Medication Review (RN CM) Referral to Pharmacy  Some recent data might be hidden

## 2019-10-31 NOTE — Progress Notes (Incomplete)
Chaplain engaged in follow-up visit with Jonathan Wise.  [name] voiced that he will be discharging soon and that he has plans of going to a nursing facility for almost a month before going home.  He also voiced some different grief he has experienced in the past from family deaths to his spiritual community.  Chaplain affirmed [name]'s feelings

## 2019-10-31 NOTE — Progress Notes (Signed)
PROGRESS NOTE    Jonathan Wise  QJJ:[id number] DOB: 1945/06/10 DOA: 10/19/2019 PCP: Christain Sacramento, MD    Chief Complaint  Patient presents with  . Altered Mental Status    Brief Narrative:  Jonathan Wise is a 74 y.o. male with hx of afib on eliquis, DM, DVT, HTN, admitted at [place] from SNF with AMS, AKI, with Hyperkalemia, developed hypoxia, hypotension, intubated, started on pressors and transferred here to [facility] ICU.   Patient was admitted to the hospital with working diagnosis of septic shock due to urinary tract infection/right lower lobe pneumonia, present on admission.  Patient received broad-spectrum antibiotic therapy with vancomycin, metronidazole and cefepime. His cultures have been no growth. He was off pressors and  liberated from mechanical ventilation on July 14.  Transferred to [facility] July 16.  Hallucinations and jerking movement has resolved He continues to be very weak and deconditioned, he has bilateral leg wounds that need dressing changes daily, he is hoping to go to skilled nursing facility.  Subjective:  Reports steroids help the rash No fever bp stable   Assessment & Plan:   Principal Problem:   Severe sepsis with septic shock (HCC) Active Problems:   Venous stasis ulcers (HCC)   Type 2 diabetes mellitus without complication (HCC)   Current use of long term anticoagulation   Atrial fibrillation (HCC)   Acute respiratory failure with hypoxia (HCC)   Sepsis secondary to UTI ([place])   Lobar pneumonia (HCC)   DVT (deep venous thrombosis) (HCC)   Septic shock present on admission due to urinary tract infection and right lower lobe pneumonia (small right para-pneumonic effusion).Acute hypoxic respiratory failure required intubation ,present on admission completed antibiotic therapy with cefepime.  Off pressors , extubated . patient has remained hemodynamically stable, no fever, leukocytosis is resolved   Acute metabolic  encephalopathy. Present on admission he reports slow memory, but no confusion or hallucinations.  Today he is very pleasant, alert and oriented.   Chronic atrial fibrillation.Continue rate controlled withmetoprolol  ( has been on lopressor q8hrs)and anticoagulation with apixaban. Still has intermittent tachycardia, consider adding calcium channel blocker if BP allows He reports Dr Einar Gip is his cardiologist.  Acute on chronic diastolic heart failure.Chronic hypoxic respiratory failure on home oxygen. Received metolazone and IV Lasix initially ,improved volume status, patiet has obesity and chronic lower extremity edema, intravascularly euvolemic.  Continue blood pressure and atrial fibrillation control.   Monitor volume status.  Bilat anterior legs with scattered full thickness stasis ulcers (present on admission)  Red and moist, small amt pink drainage, surrounded by dry peeling scaley skin.  Measurement: Left leg 4X3X.2cm, .8X.8X.2cm, 1X1X.2cm Right leg 5X5X.2cm and 1X1X.2cm Dressing procedure/placement/frequency: Topical treatment orders provided for bedside nurses to perform as follows to promote healing. Apply xeroform gauze to bilat leg wounds Q day, then cover with ABD pads and kerlex.  Per wound care recommendation: Wound need to be observed daily for the next 3 weeks ,wash legs with soap and water daily, then dressing change daily as directed above, dressing changes for both legs will take more than 30 minutes every day. Patient would benefit skilled nursing facility placement for wound care.  Social worker consulted.  AKIwith hypokalemia/ metabolic alkalosis.His renal function has normalized will continue to follow renal panel as needed, he is tolerating po well and is off diuretic therapy.   Hypomagnesemia  IV mag on 7/22, repeat lab in am   Controlled, insulin-dependent T2DM, Hgb A1c 5.9. Home meds Tyler Aas  on hold since admission Has been On insulin sliding  scale for glucose cover  Restart long acting insulin and increase meal coverage insulin , he will be getting steroids for several days for skin rash      HTN. Blood pressure low normal on 7/22, stable today  continue low-dosemetoprololwith holding parameters   Obesity class II  Body mass index is 38.68 kg/m.  Rash, drug reaction vs heat rash, does not itch, no burning, monitor, persists and got worse, diffuse on back , bilateral flank, bilateral groin, inner thighs Improved with solumedrol x1 , plan to continue steroids for a few days with quick taper. Consider outpatient dermatology referral if rash persists  FTT; bed to wheel chair bound, need Hoyer lift to get out of bed.   DVT prophylaxis:  apixaban (ELIQUIS) tablet 5 mg   Code Status: DNR Family Communication: Wife at bedside on 7/21 and 7/22 Disposition:   Status is: Inpatient    Dispo: The patient is from: Skilled nursing facility              Anticipated d/c is to: Skilled nursing facility              Anticipated d/c date is: Awaiting for skilled nursing facility facility bed                Consultants:   Wound care  Procedures:   Central line placement and removal  Intubation and extubation  Antimicrobials:   As above     Objective: Vitals:   10/30/19 1937 10/30/19 2340 10/31/19 0335 10/31/19 0714  BP: 121/66 (!) 132/69 (!) 138/81 (!) 145/91  Pulse: 85 90 100 100  Resp: 20 17 19 20   Temp: 98 F (36.7 C) 98.1 F (36.7 C) 97.8 F (36.6 C) 97.9 F (36.6 C)  TempSrc: Oral Oral Axillary Oral  SpO2: 92% 93% 99% 99%  Weight:      Height:        Intake/Output Summary (Last 24 hours) at 10/31/2019 1231 Last data filed at 10/31/2019 1000 Gross per 24 hour  Intake 960 ml  Output 575 ml  Net 385 ml   Filed Weights   10/28/19 0500 10/29/19 0638 10/30/19 0634  Weight: 131 kg 132 kg 133 kg    Examination:  General exam: chronically ill appearing, calm, NAD, aaox3 Respiratory system: Clear  to auscultation. Respiratory effort normal. Cardiovascular system: S1 & S2 heard, IRRR. No JVD, no murmur, No pedal edema. Gastrointestinal system: Abdomen is nondistended, soft and nontender. No organomegaly or masses felt. Normal bowel sounds heard. Central nervous system: Alert and oriented. No focal neurological deficits. Extremities: generalized weakness Skin: diffuse erythematous rash, bilateral lower extremity venus stasis changes, see pic below Psychiatry: Judgement and insight appear normal. Mood & affect appropriate.    Right leg, pic taken on 7/22   Left leg , pic taken on 7/22    Data Reviewed: I have personally reviewed following labs and imaging studies  CBC: Recent Labs  Lab 10/30/19 0231  WBC 11.3*  NEUTROABS 8.1*  HGB 10.0*  HCT 36.0*  MCV 89.6  PLT 802    Basic Metabolic Panel: Recent Labs  Lab 10/25/19 0417 10/27/19 0300 10/30/19 0231  NA 141 139 140  K 3.4* 3.5 3.8  CL 92* 89* 92*  CO2 40* 41* 41*  GLUCOSE 102* 92 156*  BUN 24* 24* 22  CREATININE 0.67 0.63 0.72  CALCIUM 9.0 9.2 8.9  MG  --   --  1.6*  GFR: Estimated Creatinine Clearance: 115.8 mL/min (by C-G formula based on SCr of 0.72 mg/dL).  Liver Function Tests: Recent Labs  Lab 10/30/19 0231  AST 24  ALT 24  ALKPHOS 72  BILITOT 0.6  PROT 6.0*  ALBUMIN 2.6*    CBG: Recent Labs  Lab 10/30/19 1106 10/30/19 1635 10/30/19 2111 10/31/19 0637 10/31/19 1202  GLUCAP 161* 184* 301* 202* 177*     No results found for this or any previous visit (from the past 240 hour(s)).       Radiology Studies: No results found.      Scheduled Meds: . apixaban  5 mg Oral BID  . chlorhexidine gluconate (MEDLINE KIT)  15 mL Mouth Rinse BID  . docusate sodium  100 mg Oral BID  . Gerhardt's butt cream   Topical TID  . insulin aspart  0-9 Units Subcutaneous TID WC  . insulin aspart  3 Units Subcutaneous TID WC  . insulin glargine  8 Units Subcutaneous QHS  . mouth rinse  15  mL Mouth Rinse BID  . methylPREDNISolone (SOLU-MEDROL) injection  60 mg Intravenous Once  . metoprolol tartrate  12.5 mg Oral BID  . multivitamin with minerals  1 tablet Oral Daily  . mupirocin ointment   Nasal BID  . nutrition supplement (JUVEN)  1 packet Oral BID BM  . Ensure Max Protein  11 oz Oral BID  . sodium chloride flush  10-40 mL Intracatheter Q12H   Continuous Infusions: . sodium chloride Stopped (10/20/19 1449)  . sodium chloride       LOS: 12 days     Time spent: 68mns I have personally reviewed and interpreted on  10/31/2019 daily labs,  imagings as discussed above under date review session and assessment and plans.  I reviewed all nursing notes, pharmacy notes, consultant notes,  vitals, pertinent old records  I have discussed plan of care as described above with RN , patient on 10/31/2019  Voice Recognition /Dragon dictation system was used to create this note, attempts have been made to correct errors. Please contact the author with questions and/or clarifications.   FFlorencia Reasons MD PhD FACP [facility]  Available via Epic secure chat 7am-7pm for nonurgent issues Please page for urgent issues To page the attending provider between 7A-7P or the covering provider during after hours 7P-7A, please log into the web site [email or web address] and access using universal [place] password for that web site. If you do not have the password, please call the hospital operator.    10/31/2019, 12:31 PM

## 2019-10-31 NOTE — Plan of Care (Signed)

## 2019-11-01 ENCOUNTER — Other Ambulatory Visit: Payer: Self-pay

## 2019-11-01 LAB — GLUCOSE, CAPILLARY
Glucose-Capillary: 198 mg/dL — ABNORMAL HIGH (ref 70–99)
Glucose-Capillary: 266 mg/dL — ABNORMAL HIGH (ref 70–99)
Glucose-Capillary: 244 mg/dL — ABNORMAL HIGH (ref 70–99)
Glucose-Capillary: 271 mg/dL — ABNORMAL HIGH (ref 70–99)

## 2019-11-01 LAB — MAGNESIUM: Magnesium: 2.1 mg/dL (ref 1.7–2.4)

## 2019-11-01 LAB — BASIC METABOLIC PANEL
Anion gap: 6 (ref 5–15)
BUN: 41 mg/dL — ABNORMAL HIGH (ref 8–23)
CO2: 41 mmol/L — ABNORMAL HIGH (ref 22–32)
Calcium: 9.3 mg/dL (ref 8.9–10.3)
Chloride: 92 mmol/L — ABNORMAL LOW (ref 98–111)
Creatinine, Ser: 0.62 mg/dL (ref 0.61–1.24)
GFR calc Af Amer: >60 mL/min (ref >60)
GFR calc non Af Amer: >60 mL/min (ref >60)
Glucose, Bld: 287 mg/dL — ABNORMAL HIGH (ref 70–99)
Potassium: 4.7 mmol/L (ref 3.5–5.1)
Sodium: 139 mmol/L (ref 135–145)

## 2019-11-01 MED ORDER — INSULIN GLARGINE 100 UNIT/ML ~~LOC~~ SOLN
5.0000 [IU] | Freq: Two times a day (BID) | SUBCUTANEOUS | Status: DC
  Administered 2019-11-01 – 2019-11-02 (×3): 5 [IU] via SUBCUTANEOUS
  Filled 2019-11-01 (×4): qty 0.05

## 2019-11-01 MED ORDER — TAMSULOSIN HCL 0.4 MG PO CAPS
0.4000 mg | ORAL_CAPSULE | Freq: Every day | ORAL | Status: DC
  Administered 2019-11-01 – 2019-11-04 (×4): 0.4 mg via ORAL
  Filled 2019-11-01 (×4): qty 1

## 2019-11-01 MED ORDER — INSULIN ASPART 100 UNIT/ML ~~LOC~~ SOLN
4.0000 [IU] | Freq: Three times a day (TID) | SUBCUTANEOUS | Status: DC
  Administered 2019-11-01 – 2019-11-02 (×4): 4 [IU] via SUBCUTANEOUS

## 2019-11-01 MED ORDER — PREDNISONE 20 MG PO TABS
40.0000 mg | ORAL_TABLET | Freq: Every day | ORAL | Status: DC
  Administered 2019-11-02: 40 mg via ORAL
  Filled 2019-11-01: qty 2

## 2019-11-01 NOTE — TOC Progression Note (Addendum)
Transition of Care [facility]) - Progression Note    Patient Details  Name: Jonathan Wise MRN: [id number] Date of Birth: [birth year]  Transition of Care [facility]) CM/SW Contact  Verna Czech [place], [place] Phone Number: [phone] 11/01/2019, 2:37 PM  Clinical Narrative:     Phone call to [employer] to check insurance authorization. Patient's authorization still under review-no determination has been made yet.  This social worker will continue to check status of authorization.  Expected Discharge Plan: Skilled Nursing Facility Barriers to Discharge: English as a second language teacher, Continued Medical Work up  Expected Discharge Plan and Services Expected Discharge Plan: Skilled Nursing Facility       Living arrangements for the past 2 months: Single Family Home                                       Social Determinants of Health (SDOH) Interventions    Readmission Risk Interventions Readmission Risk Prevention Plan 10/30/2019  Transportation Screening Complete  PCP or Specialist Appt within 5-7 Days Not Complete  Not Complete comments disposition pending  Home Care Screening Complete  Medication Review (RN CM) Referral to Pharmacy  Some recent data might be hidden

## 2019-11-01 NOTE — Progress Notes (Signed)
PROGRESS NOTE    [name]  FBP:[id number] DOB: 08/24/1945 DOA: 10/19/2019 PCP: Christain Sacramento, MD    Chief Complaint  Patient presents with  . Altered Mental Status    Brief Narrative:  Jonathan Wise is a 74 y.o. male with hx of afib on eliquis, DM, DVT, HTN, admitted at [place] from SNF with AMS, AKI, with Hyperkalemia, developed hypoxia, hypotension, intubated, started on pressors and transferred here to [facility] ICU.   Patient was admitted to the hospital with working diagnosis of septic shock due to urinary tract infection/right lower lobe pneumonia, present on admission.  Patient received broad-spectrum antibiotic therapy with vancomycin, metronidazole and cefepime. His cultures have been no growth. He was off pressors and  liberated from mechanical ventilation on July 14.  Transferred to [facility] July 16.  Hallucinations and jerking movement has resolved He continues to be very weak and deconditioned, he has bilateral leg wounds that need dressing changes daily, he is hoping to go to skilled nursing facility.  Subjective:  Reports steroids help the rash No fever bp stable he has multiple questions regarding how to prevent UTI in the future, how to check his oxygen at home and questions regarding wound care, all addressed  Assessment & Plan:   Principal Problem:   Severe sepsis with septic shock ([place]) Active Problems:   Venous stasis ulcers (HCC)   Type 2 diabetes mellitus without complication ([place])   Current use of long term anticoagulation   Atrial fibrillation (HCC)   Acute respiratory failure with hypoxia (HCC)   Sepsis secondary to UTI ([place])   Lobar pneumonia (HCC)   DVT (deep venous thrombosis) ([place])   Septic shock present on admission due to urinary tract infection and right lower lobe pneumonia (small right para-pneumonic effusion).Acute hypoxic respiratory failure required intubation ,present on admission completed antibiotic therapy with  cefepime.  Off pressors , extubated . patient has remained hemodynamically stable, no fever, leukocytosis is resolved   Acute metabolic encephalopathy. Present on admission he reports slow memory, but no confusion or hallucinations.  Today he is very pleasant, alert and oriented.   Chronic atrial fibrillation.Continue rate controlled withmetoprolol  ( has been on lopressor q8hrs)and anticoagulation with apixaban. Still has intermittent tachycardia, consider adding calcium channel blocker if BP allows He reports Dr Einar Gip is his cardiologist.  Acute on chronic diastolic heart failure.Chronic hypoxic respiratory failure on home oxygen. Received metolazone and IV Lasix initially ,improved volume status, patiet has obesity and chronic lower extremity edema, intravascularly euvolemic.  Continue blood pressure and atrial fibrillation control.   Monitor volume status.  Bilat anterior legs with scattered full thickness stasis ulcers (present on admission)  Red and moist, small amt pink drainage, surrounded by dry peeling scaley skin.  Measurement: Left leg 4X3X.2cm, .8X.8X.2cm, 1X1X.2cm Right leg 5X5X.2cm and 1X1X.2cm Dressing procedure/placement/frequency: Topical treatment orders provided for bedside nurses to perform as follows to promote healing. Apply xeroform gauze to bilat leg wounds Q day, then cover with ABD pads and kerlex.  Per wound care recommendation: Wound need to be observed daily for the next 3 weeks ,wash legs with soap and water daily, then dressing change daily as directed above, dressing changes for both legs will take more than 30 minutes every day. Patient would benefit skilled nursing facility placement for wound care.  Social worker consulted.  AKIwith hypokalemia/ metabolic alkalosis.His renal function has normalized will continue to follow renal panel as needed, he is tolerating po well and is off  diuretic therapy.   Hypomagnesemia  IV mag on 7/22,  repeat lab in am   Controlled, insulin-dependent T2DM, Hgb A1c 5.9. Home meds Tyler Aas on hold since admission Has been On insulin sliding scale for glucose cover  Blood glucose elevated likely due to steroids, increase lantus to 6untis bid and increase meal  coverage insulin, continue sliding scale      HTN. Blood pressure low normal on 7/22, stable today  continue low-dosemetoprololwith holding parameters   Obesity class II  Body mass index is 38.68 kg/m.  Rash, drug reaction vs heat rash, does not itch, no burning, monitor, persists and got worse, diffuse on back , bilateral flank, bilateral groin, inner thighs Improved with solumedrol x1 , plan to continue steroids for a few days with quick taper. Consider outpatient dermatology referral if rash persists  FTT; bed to wheel chair bound, need Hoyer lift to get out of bed.   DVT prophylaxis:  apixaban (ELIQUIS) tablet 5 mg   Code Status: DNR Family Communication: Wife at bedside on 7/21 , 7/22, 7/24 Disposition:   Status is: Inpatient    Dispo: The patient is from: Skilled nursing facility              Anticipated d/c is to: Skilled nursing facility              Anticipated d/c date is: medically stable to discharge ,Awaiting insurance authorization  for skilled nursing facility facility                 Consultants:   Wound care  Procedures:   Central line placement and removal  Intubation and extubation  Antimicrobials:   As above     Objective: Vitals:   10/31/19 0714 10/31/19 2016 10/31/19 2313 11/01/19 0407  BP: (!) 145/91 (!) 157/87 (!) 133/77 (!) 135/83  Pulse: 100 89 91 67  Resp: 20 18 16 19   Temp: 97.9 F (36.6 C) 97.9 F (36.6 C) 97.8 F (36.6 C) 97.6 F (36.4 C)  TempSrc: Oral Oral Oral Oral  SpO2: 99%  99% 100%  Weight:      Height:        Intake/Output Summary (Last 24 hours) at 11/01/2019 0830 Last data filed at 11/01/2019 0400 Gross per 24 hour  Intake 1080 ml  Output 1100 ml   Net -20 ml   Filed Weights   10/28/19 0500 10/29/19 0638 10/30/19 0634  Weight: 131 kg 132 kg 133 kg    Examination:  General exam: chronically ill appearing, calm, NAD, aaox3 Respiratory system: Clear to auscultation. Respiratory effort normal. Cardiovascular system: S1 & S2 heard, IRRR. No JVD, no murmur, No pedal edema. Gastrointestinal system: Abdomen is nondistended, soft and nontender. No organomegaly or masses felt. Normal bowel sounds heard. Central nervous system: Alert and oriented. No focal neurological deficits. Extremities: generalized weakness Skin: diffuse erythematous rash, bilateral lower extremity venus stasis changes, see pic below Psychiatry: Judgement and insight appear normal. Mood & affect appropriate.    Right leg, pic taken on 7/22   Left leg , pic taken on 7/22    Data Reviewed: I have personally reviewed following labs and imaging studies  CBC: Recent Labs  Lab 10/30/19 0231  WBC 11.3*  NEUTROABS 8.1*  HGB 10.0*  HCT 36.0*  MCV 89.6  PLT 759    Basic Metabolic Panel: Recent Labs  Lab 10/27/19 0300 10/30/19 0231 11/01/19 0311  NA 139 140 139  K 3.5 3.8 4.7  CL 89* 92* 92*  CO2 41* 41* 41*  GLUCOSE 92 156* 287*  BUN 24* 22 41*  CREATININE 0.63 0.72 0.62  CALCIUM 9.2 8.9 9.3  MG  --  1.6* 2.1    GFR: Estimated Creatinine Clearance: 115.8 mL/min (by C-G formula based on SCr of 0.62 mg/dL).  Liver Function Tests: Recent Labs  Lab 10/30/19 0231  AST 24  ALT 24  ALKPHOS 72  BILITOT 0.6  PROT 6.0*  ALBUMIN 2.6*    CBG: Recent Labs  Lab 10/31/19 0637 10/31/19 1202 10/31/19 1627 10/31/19 2100 11/01/19 0628  GLUCAP 202* 177* 193* 282* 198*     Recent Results (from the past 240 hour(s))  SARS Coronavirus 2 by RT PCR (hospital order, performed in [facility] hospital lab) Nasopharyngeal Nasopharyngeal Swab     Status: None   Collection Time: 10/31/19  1:13 PM   Specimen: Nasopharyngeal Swab  Result Value Ref Range  Status   SARS Coronavirus 2 NEGATIVE NEGATIVE Final    Comment: (NOTE) SARS-CoV-2 target nucleic acids are NOT DETECTED.  The SARS-CoV-2 RNA is generally detectable in upper and lower respiratory specimens during the acute phase of infection. The lowest concentration of SARS-CoV-2 viral copies this assay can detect is 250 copies / mL. A negative result does not preclude SARS-CoV-2 infection and should not be used as the sole basis for treatment or other patient management decisions.  A negative result may occur with improper specimen collection / handling, submission of specimen other than nasopharyngeal swab, presence of viral mutation(s) within the areas targeted by this assay, and inadequate number of viral copies (<250 copies / mL). A negative result must be combined with clinical observations, patient history, and epidemiological information.  Fact Sheet for Patients:   StrictlyIdeas.no  Fact Sheet for Healthcare Providers: BankingDealers.co.za  This test is not yet approved or  cleared by the [place] FDA and has been authorized for detection and/or diagnosis of SARS-CoV-2 by FDA under an Emergency Use Authorization (EUA).  This EUA will remain in effect (meaning this test can be used) for the duration of the COVID-19 declaration under Section 564(b)(1) of the Act, 21 U.S.C. section 360bbb-3(b)(1), unless the authorization is terminated or revoked sooner.  Performed at [facility], [place] [address], [place], [place] [address]          Radiology Studies: No results found.      Scheduled Meds: . apixaban  5 mg Oral BID  . chlorhexidine gluconate (MEDLINE KIT)  15 mL Mouth Rinse BID  . docusate sodium  100 mg Oral BID  . Gerhardt's butt cream   Topical TID  . insulin aspart  0-9 Units Subcutaneous TID WC  . insulin aspart  4 Units Subcutaneous TID WC  . insulin glargine  5 Units Subcutaneous BID  . mouth  rinse  15 mL Mouth Rinse BID  . metoprolol tartrate  12.5 mg Oral BID  . multivitamin with minerals  1 tablet Oral Daily  . mupirocin ointment   Nasal BID  . nutrition supplement (JUVEN)  1 packet Oral BID BM  . [START ON 11/02/2019] predniSONE  40 mg Oral Q breakfast  . Ensure Max Protein  11 oz Oral BID  . sodium chloride flush  10-40 mL Intracatheter Q12H   Continuous Infusions: . sodium chloride Stopped (10/20/19 1449)  . sodium chloride       LOS: 13 days     Time spent: 57mns I have personally reviewed and interpreted on  11/01/2019 daily labs,  imagings as discussed above under date review session and assessment and plans.  I reviewed all nursing notes, pharmacy notes, consultant notes,  vitals, pertinent old records  I have discussed plan of care as described above with RN , patient on 11/01/2019  Voice Recognition /Dragon dictation system was used to create this note, attempts have been made to correct errors. Please contact the author with questions and/or clarifications.   Florencia Reasons, MD PhD FACP [facility]  Available via Epic secure chat 7am-7pm for nonurgent issues Please page for urgent issues To page the attending provider between 7A-7P or the covering provider during after hours 7P-7A, please log into the web site [email or web address] and access using universal [place] password for that web site. If you do not have the password, please call the hospital operator.    11/01/2019, 8:30 AM

## 2019-11-02 LAB — GLUCOSE, CAPILLARY
Glucose-Capillary: 185 mg/dL — ABNORMAL HIGH (ref 70–99)
Glucose-Capillary: 225 mg/dL — ABNORMAL HIGH (ref 70–99)
Glucose-Capillary: 312 mg/dL — ABNORMAL HIGH (ref 70–99)

## 2019-11-02 MED ORDER — FUROSEMIDE 40 MG PO TABS
40.0000 mg | ORAL_TABLET | Freq: Every day | ORAL | 0 refills | Status: DC | PRN
Start: 2019-11-02 — End: 2019-11-03

## 2019-11-02 MED ORDER — INSULIN GLARGINE 100 UNIT/ML ~~LOC~~ SOLN
10.0000 [IU] | Freq: Two times a day (BID) | SUBCUTANEOUS | Status: DC
  Administered 2019-11-02 – 2019-11-04 (×4): 10 [IU] via SUBCUTANEOUS
  Filled 2019-11-02 (×5): qty 0.1

## 2019-11-02 MED ORDER — PREDNISONE 10 MG PO TABS: ORAL_TABLET | ORAL | Status: DC

## 2019-11-02 MED ORDER — METOPROLOL TARTRATE 25 MG PO TABS: 12.5000 mg | ORAL_TABLET | Freq: Two times a day (BID) | ORAL | Status: DC

## 2019-11-02 MED ORDER — PREDNISONE 20 MG PO TABS
30.0000 mg | ORAL_TABLET | Freq: Every day | ORAL | Status: DC
  Administered 2019-11-03 – 2019-11-04 (×2): 30 mg via ORAL
  Filled 2019-11-02 (×3): qty 1

## 2019-11-02 MED ORDER — INSULIN ASPART 100 UNIT/ML ~~LOC~~ SOLN
5.0000 [IU] | Freq: Three times a day (TID) | SUBCUTANEOUS | Status: DC
  Administered 2019-11-02 – 2019-11-04 (×6): 5 [IU] via SUBCUTANEOUS

## 2019-11-02 MED ORDER — TAMSULOSIN HCL 0.4 MG PO CAPS: 0.4000 mg | ORAL_CAPSULE | Freq: Every day | ORAL | Status: DC

## 2019-11-02 MED ORDER — POLYETHYLENE GLYCOL 3350 17 G PO PACK: 17.0000 g | PACK | Freq: Every day | ORAL | 0 refills | Status: DC | PRN

## 2019-11-02 NOTE — Progress Notes (Signed)
PROGRESS NOTE    [name]  ZCH:[id number] DOB: [birth year] DOA: 10/19/2019 PCP: Christain Sacramento, MD    Chief Complaint  Patient presents with  . Altered Mental Status    Brief Narrative:  Jonathan Wise is a 74 y.o. male with hx of afib on eliquis, DM, DVT, HTN, admitted at [place] from SNF with AMS, AKI, with Hyperkalemia, developed hypoxia, hypotension, intubated, started on pressors and transferred here to [facility] ICU.   Patient was admitted to the hospital with working diagnosis of septic shock due to urinary tract infection/right lower lobe pneumonia, present on admission.  Patient received broad-spectrum antibiotic therapy with vancomycin, metronidazole and cefepime. His cultures have been no growth. He was off pressors and  liberated from mechanical ventilation on July 14.  Transferred to [facility] July 16.  Hallucinations and jerking movement has resolved He continues to be very weak and deconditioned, he has bilateral leg wounds that need dressing changes daily, he is hoping to go to skilled nursing facility.  Subjective:  Rash improved, blood glucose elevated No fever bp low normal   Assessment & Plan:   Principal Problem:   Severe sepsis with septic shock (HCC) Active Problems:   Venous stasis ulcers (HCC)   Type 2 diabetes mellitus without complication (HCC)   Current use of long term anticoagulation   Atrial fibrillation (HCC)   Acute respiratory failure with hypoxia (HCC)   Sepsis secondary to UTI ([place])   Lobar pneumonia (HCC)   DVT (deep venous thrombosis) (HCC)  Septic shock acute metabolic encephalopathy present on admission due to urinary tract infection and right lower lobe pneumonia (small right para-pneumonic effusion).Acute on chronic hypoxic respiratory failure required intubation ,present on admission completed antibiotic therapy with cefepime, last dose on 7/17.Off pressors , extubated . patient has remained hemodynamically  stable, no fever, leukocytosis is resolved   Acute metabolic encephalopathy. Present on admission he reports slow memory, but no confusion or hallucinations.  Today he is very pleasant, alert and oriented.  Paroxysmal atrial fibrillation. Continue ratecontrolled withmetoprolol andanticoagulation withapixaban. He reports Dr Einar Gip is his cardiologist.  Acute on chronic diastolic heart failure.Chronic hypoxic respiratory failure on home oxygen. Received metolazone and IV Lasix initially ,improved volume status,he presents with bilateral lower extremity edema, edema has resolved at discharge intravascularlyeuvolemic.  Continue blood pressure and atrial fibrillation control. Monitor volume status. He is discharged on lasix prn for edema  Bilat anterior legs with scattered full thickness stasis ulcers(present on admission) Red and moist, small amt pink drainage, surrounded by dry peeling scaley skin.  Measurement:Left leg 4X3X.2cm, .8X.8X.2cm, 1X1X.2cm Right leg 5X5X.2cm and 1X1X.2cm Dressing procedure/placement/frequency:Topical treatment orders provided for bedside nurses to perform as follows to promote healing. Apply xeroform gauze to bilat leg wounds Q day, then cover with ABD pads and kerlex.  Per wound care recommendation: Wound need to be observed daily for the next 3 weeks ,wash legs with soap and water daily, then dressing change daily as directed above, dressing changes for both legs will take more than 30 minutes every day. Patient would benefit skilled nursing facility placement for wound care. Social worker consulted.  AKIwith hypokalemia/ metabolic alkalosis.His renal function has normalized Repeat bmp at hospital discharge follow up  Hypomagnesemia replaced  Controlled, insulin-dependent T2DM, Hgb A1c 5.9. Home meds Tresiba on hold since admission,plan to  resumed at discharge. Home medication Metformin held in hospital,plan to  resumed at discharge. on lantus bid and Oninsulin sliding scale for glucose cover in the  hospital Blood glucose elevated likely due to steroids, insulin does adjusted, expect improvement with steroid taper     HTN.  Blood pressure low normal, he is asymptomatic Home BP meds verapamil and lisinopril held since admission continue low-dosemetoprololwith holding parameters  Obesity class II Body mass index is 38.68 kg/m.  Rash, drug reaction vs heat rash, does not itch, no burning, monitor, persists and got worse, diffuse on back , bilateral flank, bilateral groin, inner thighs Improved with steroid, taper steroid to off Consider outpatient dermatology referral if rash reoccur   BPH; started flomax, reports helped,   FTT; bed to wheel chair bound, need Hoyer lift to get out of bed.    DVT prophylaxis:  apixaban (ELIQUIS) tablet 5 mg   Code Status: DNR Family Communication: Wife at bedside on 7/21 , 7/22, 7/24, 7/25 Disposition:   Status is: Inpatient    Dispo: The patient is from: Skilled nursing facility              Anticipated d/c is to: Skilled nursing facility              Anticipated d/c date is: medically stable to discharge ,Awaiting insurance authorization  for skilled nursing facility facility                 Consultants:   Wound care  Procedures:   Central line placement and removal  Intubation and extubation  Antimicrobials:   Cefepime 7/11-7/17 Flagyl 7/11-7/13 Vancomycin 7/11-7/13     Objective: Vitals:   11/01/19 2012 11/02/19 0802 11/02/19 1228 11/02/19 1456  BP:  (!) 85/54 91/76 95/73  Pulse: 100   97  Resp: 19 19    Temp: 97.9 F (36.6 C) 97.7 F (36.5 C)  97.7 F (36.5 C)  TempSrc: Oral Oral  Oral  SpO2:  93%  100%  Weight:      Height:        Intake/Output Summary (Last 24 hours) at 11/02/2019 1732 Last data filed at 11/02/2019 1500 Gross per 24 hour  Intake 123 ml  Output 1400 ml  Net -1277 ml   Filed  Weights   10/28/19 0500 10/29/19 0638 10/30/19 0634  Weight: 131 kg 132 kg 133 kg    Examination:  General exam: chronically ill appearing, calm, NAD, aaox3 Respiratory system: Clear to auscultation. Respiratory effort normal. Cardiovascular system: S1 & S2 heard, IRRR. No JVD, no murmur, No pedal edema. Gastrointestinal system: Abdomen is nondistended, soft and nontender. No organomegaly or masses felt. Normal bowel sounds heard. Central nervous system: Alert and oriented. No focal neurological deficits. Extremities: generalized weakness Skin: diffuse erythematous rash has improved, bilateral lower extremity venus stasis changes, see pic below Psychiatry: Judgement and insight appear normal. Mood & affect appropriate.    Right leg, pic taken on 7/22   Left leg , pic taken on 7/22    Data Reviewed: I have personally reviewed following labs and imaging studies  CBC: Recent Labs  Lab 10/30/19 0231  WBC 11.3*  NEUTROABS 8.1*  HGB 10.0*  HCT 36.0*  MCV 89.6  PLT 283    Basic Metabolic Panel: Recent Labs  Lab 10/27/19 0300 10/30/19 0231 11/01/19 0311  NA 139 140 139  K 3.5 3.8 4.7  CL 89* 92* 92*  CO2 41* 41* 41*  GLUCOSE 92 156* 287*  BUN 24* 22 41*  CREATININE 0.63 0.72 0.62  CALCIUM 9.2 8.9 9.3  MG  --  1.6* 2.1    GFR: Estimated Creatinine Clearance:  115.8 mL/min (by C-G formula based on SCr of 0.62 mg/dL).  Liver Function Tests: Recent Labs  Lab 10/30/19 0231  AST 24  ALT 24  ALKPHOS 72  BILITOT 0.6  PROT 6.0*  ALBUMIN 2.6*    CBG: Recent Labs  Lab 11/01/19 1636 11/01/19 2122 11/02/19 0611 11/02/19 1137 11/02/19 1618  GLUCAP 244* 271* 185* 225* 312*     Recent Results (from the past 240 hour(s))  SARS Coronavirus 2 by RT PCR (hospital order, performed in [facility] hospital lab) Nasopharyngeal Nasopharyngeal Swab     Status: None   Collection Time: 10/31/19  1:13 PM   Specimen: Nasopharyngeal Swab  Result Value Ref Range Status    SARS Coronavirus 2 NEGATIVE NEGATIVE Final    Comment: (NOTE) SARS-CoV-2 target nucleic acids are NOT DETECTED.  The SARS-CoV-2 RNA is generally detectable in upper and lower respiratory specimens during the acute phase of infection. The lowest concentration of SARS-CoV-2 viral copies this assay can detect is 250 copies / mL. A negative result does not preclude SARS-CoV-2 infection and should not be used as the sole basis for treatment or other patient management decisions.  A negative result may occur with improper specimen collection / handling, submission of specimen other than nasopharyngeal swab, presence of viral mutation(s) within the areas targeted by this assay, and inadequate number of viral copies (<250 copies / mL). A negative result must be combined with clinical observations, patient history, and epidemiological information.  Fact Sheet for Patients:   StrictlyIdeas.no  Fact Sheet for Healthcare Providers: BankingDealers.co.za  This test is not yet approved or  cleared by the [place] FDA and has been authorized for detection and/or diagnosis of SARS-CoV-2 by FDA under an Emergency Use Authorization (EUA).  This EUA will remain in effect (meaning this test can be used) for the duration of the COVID-19 declaration under Section 564(b)(1) of the Act, 21 U.S.C. section 360bbb-3(b)(1), unless the authorization is terminated or revoked sooner.  Performed at [facility], [place] [address] [place], [place] [address]          Radiology Studies: No results found.      Scheduled Meds: . apixaban  5 mg Oral BID  . chlorhexidine gluconate (MEDLINE KIT)  15 mL Mouth Rinse BID  . docusate sodium  100 mg Oral BID  . Gerhardt's butt cream   Topical TID  . insulin aspart  0-9 Units Subcutaneous TID WC  . insulin aspart  5 Units Subcutaneous TID WC  . insulin glargine  10 Units Subcutaneous BID  . mouth rinse   15 mL Mouth Rinse BID  . metoprolol tartrate  12.5 mg Oral BID  . multivitamin with minerals  1 tablet Oral Daily  . mupirocin ointment   Nasal BID  . nutrition supplement (JUVEN)  1 packet Oral BID BM  . [START ON 11/03/2019] predniSONE  30 mg Oral Q breakfast  . Ensure Max Protein  11 oz Oral BID  . sodium chloride flush  10-40 mL Intracatheter Q12H  . tamsulosin  0.4 mg Oral Daily   Continuous Infusions: . sodium chloride Stopped (10/20/19 1449)  . sodium chloride       LOS: 14 days     Time spent: 2mns I have personally reviewed and interpreted on  11/02/2019 daily labs,  imagings as discussed above under date review session and assessment and plans.  I reviewed all nursing notes, pharmacy notes, consultant notes,  vitals, pertinent old records  I have  discussed plan of care as described above with RN , patient on 11/02/2019  Voice Recognition /Dragon dictation system was used to create this note, attempts have been made to correct errors. Please contact the author with questions and/or clarifications.   Florencia Reasons, MD PhD FACP [facility]  Available via Epic secure chat 7am-7pm for nonurgent issues Please page for urgent issues To page the attending provider between 7A-7P or the covering provider during after hours 7P-7A, please log into the web site [email or web address] and access using universal [place] password for that web site. If you do not have the password, please call the hospital operator.    11/02/2019, 5:32 PM

## 2019-11-02 NOTE — TOC Progression Note (Signed)
Transition of Care [facility]) - Progression Note    Patient Details  Name: Jonathan Wise MRN: [id number] Date of Birth: 1945-08-18  Transition of Care [facility]) CM/SW Contact  [address] Jonathan Wise [place], [place] Phone Number: 11/02/2019, 12:22 PM  Clinical Narrative:    Phone call to [facility] for status update on patient's authorization. Authorization still pending-case has been sent to the escalation team to work on as soon as possible. She will call this social worker back when a determination is made.   [address], LCSW Transition of Care [phone]    Expected Discharge Plan: Skilled Nursing Facility Barriers to Discharge: Insurance Authorization, Continued Medical Work up  Expected Discharge Plan and Services Expected Discharge Plan: Skilled Nursing Facility       Living arrangements for the past 2 months: Single Family Home                                       Social Determinants of Health (SDOH) Interventions    Readmission Risk Interventions Readmission Risk Prevention Plan 10/30/2019  Transportation Screening Complete  PCP or Specialist Appt within 5-7 Days Not Complete  Not Complete comments disposition pending  Home Care Screening Complete  Medication Review (RN CM) Referral to Pharmacy  Some recent data might be hidden

## 2019-11-02 NOTE — TOC Progression Note (Signed)
Transition of Care [facility]) - Progression Note    Patient Details  Name: Jonathan Wise MRN: [id number] Date of Birth: 05-04-1945  Transition of Care [facility]) CM/SW Contact  Verna Czech [place], [place] Phone Number: [phone] 11/02/2019, 2:39 PM  Clinical Narrative:    Phone call to [facility] to check status of authorization for SNF stay. Per [facility]-case will need to be reviewed by the Medical Director. She will follow up with the Transition of Care team when the determination is made.  Leyan Branden, LCSW Transition of Care Team [phone]    Expected Discharge Plan: Skilled Nursing Facility Barriers to Discharge: Insurance Authorization, Continued Medical Work up  Expected Discharge Plan and Services Expected Discharge Plan: Skilled Nursing Facility       Living arrangements for the past 2 months: Single Family Home Expected Discharge Date: 11/02/19                                     Social Determinants of Health (SDOH) Interventions    Readmission Risk Interventions Readmission Risk Prevention Plan 10/30/2019  Transportation Screening Complete  PCP or Specialist Appt within 5-7 Days Not Complete  Not Complete comments disposition pending  Home Care Screening Complete  Medication Review (RN CM) Referral to Pharmacy  Some recent data might be hidden

## 2019-11-03 LAB — GLUCOSE, CAPILLARY
Glucose-Capillary: 299 mg/dL — ABNORMAL HIGH (ref 70–99)
Glucose-Capillary: 220 mg/dL — ABNORMAL HIGH (ref 70–99)
Glucose-Capillary: 195 mg/dL — ABNORMAL HIGH (ref 70–99)
Glucose-Capillary: 172 mg/dL — ABNORMAL HIGH (ref 70–99)

## 2019-11-03 MED ORDER — FUROSEMIDE 40 MG PO TABS
40.0000 mg | ORAL_TABLET | Freq: Every day | ORAL | 0 refills | Status: DC | PRN
Start: 2019-11-03 — End: 2019-12-07

## 2019-11-03 MED ORDER — PREDNISONE 10 MG PO TABS: ORAL_TABLET | ORAL | 0 refills | Status: DC

## 2019-11-03 MED ORDER — METOPROLOL TARTRATE 25 MG PO TABS: 12.5000 mg | ORAL_TABLET | Freq: Two times a day (BID) | ORAL | 1 refills | Status: DC

## 2019-11-03 MED ORDER — POLYETHYLENE GLYCOL 3350 17 G PO PACK: 17.0000 g | PACK | Freq: Every day | ORAL | 0 refills | Status: DC | PRN

## 2019-11-03 MED ORDER — TAMSULOSIN HCL 0.4 MG PO CAPS: 0.4000 mg | ORAL_CAPSULE | Freq: Every day | ORAL | 1 refills | Status: DC

## 2019-11-03 NOTE — TOC Progression Note (Addendum)
Transition of Care [facility]) - Progression Note    Patient Details  Name: Jonathan Wise MRN: [id number] Date of Birth: Aug 16, 1945  Transition of [facility]) CM/SW Contact  Jonathan Wise, [place] Phone Number: 11/03/2019, 9:35 AM  Clinical Narrative:    4:35p CSW reached out [facility] to make referral. Jonathan Wise will follow-up with CSW. CSW spoke with patient's granddaugher Jonathan Wise to provide an update. CSW explained an aide is also being requested with the home health services and explained it will likely be 2-3x a week with the other services. Jonathan Wise expressed understanding. CSW will keep patient and family updated.  2:33p CSW informed patient's peer to peer was denied by [employer]. Patient expressed the previous aides that were helping care for him at home are no longer able to assist, and his wife and granddaughter are able to provide limited assistance.   9:35a CSW contacted [employer] to follow-up on insurance authorization, informed there is no determination and a peer to peer is needed by 1p today. CSW provided MD with the update and contact information [phone], option 5.   Expected Discharge Plan: Skilled Nursing Facility Barriers to Discharge: English as a second language teacher, Continued Medical Work up  Expected Discharge Plan and Services Expected Discharge Plan: Skilled Nursing Facility       Living arrangements for the past 2 months: Single Family Home Expected Discharge Date: 11/02/19                                     Social Determinants of Health (SDOH) Interventions    Readmission Risk Interventions Readmission Risk Prevention Plan 10/30/2019  Transportation Screening Complete  PCP or Specialist Appt within 5-7 Days Not Complete  Not Complete comments disposition pending  Home Care Screening Complete  Medication Review (RN CM) Referral to Pharmacy  Some recent data might be hidden

## 2019-11-03 NOTE — Discharge Summary (Signed)
Physician Discharge Summary  Jonathan Wise ZOX:[id number] DOB: [birth year] DOA: 10/19/2019  PCP: [name], MD  Admit date: 10/19/2019 Discharge date: 11/03/2019  Admitted From: Home Disposition:  Home  Discharge Condition:Stable CODE STATUS: DNR Diet recommendation: Heart Healthy    Brief/Interim Summary: Patient is a 74 y.o.malewith hx of afib on eliquis, DM, DVT, HTN, admitted at [facility] from SNF with AMS, AKI, with Hyperkalemia, developed hypoxia, hypotension, intubated, started on pressors and transferred here to [facility] ICU.  Patient was admitted to the hospital with working diagnosis of septic shock due to urinary tract infection/right lower lobe pneumonia, present on admission. Patient received broad-spectrum antibiotic therapy with vancomycin, metronidazole and cefepime. His cultures did show  no growth. He was off pressors and  liberated from mechanical ventilation on July 14. Transferred to [facility] service on July 16.He has completed antibiotics course. He continues to be very weak and deconditioned, he has bilateral leg wounds that need dressing changes daily.he was seen by PT/OT and recommended skilled nursing facility but his insurance did not authorize.  He is being planned to be discharged home with home health.  Following problems were addressed during his hospitalization:  Septic shockacute metabolic encephalopathypresent on admission due to urinary tract infection and right lower lobe pneumonia (small right para-pneumonic effusion).Acuteon chronichypoxic respiratory failure required intubation ,present on admission completed antibiotic therapy with cefepime, last dose on 7/17.Off pressors , extubated . patient has remained hemodynamically stable, no fever, leukocytosis has resolved   Acute metabolic encephalopathy.  He reports slow memory, but no confusion or hallucinations.  Currently ,he is very pleasant, alert and  oriented.  Paroxysmalatrial fibrillation. Continue ratecontrolled withmetoprolol andanticoagulation withapixaban. He follows with Dr Jacinto Halim .  Acute on chronic diastolic heart failure.Chronic hypoxic respiratory failure on home oxygen. Received metolazone and IV Lasix initially ,improved volume status,he presents with bilateral lower extremity edema, edema has resolved at discharge intravascularlyeuvolemic.  Continue blood pressure and atrial fibrillation control. He is discharged on lasix prn for edema  Bilat anterior legs with scattered full thickness stasis ulcers(present on admission) Red and moist, small amt pink drainage, surrounded by dry peeling scaley skin.  Measurement:Left leg 4X3X.2cm, .8X.8X.2cm, 1X1X.2cm Right leg 5X5X.2cm and 1X1X.2cm Dressing procedure/placement/frequency:Topical treatment orders provided for bedside nurses to perform as follows to promote healing. Apply xeroform gauze to bilat leg wounds Q day, then cover with ABD pads and kerlex. Wound care recommendation: Wound need to be observed daily for the next 3 weeks ,wash legs with soap and water daily, then dressing change daily as directed above, dressing changes for both legs will take more than 30 minutes every day. Patient would benefit skilled nursing facility placement for wound care. Social worker consulted.  AKIwith hypokalemia/ metabolic alkalosis.His renal function has normalized Repeat bmp in a week  Controlled, insulin-dependent T2DM, Hgb A1c 5.9. Home meds Tresiba on hold since admission,plan to be  resumed at discharge. Home medication Metformin held in hospital,plan to resumed at discharge. Blood glucose elevated likely due to steroids,insulin does adjusted, expect improvement with steroid taper   HTN Blood pressure low normal,he is asymptomatic Home BP meds verapamil and lisinopril held since admission Continue low-dosemetoprolol  Obesity class II   Body mass index is 38.68 kg/m.  Rash Drug reaction vs heat rash, does not itch, no burning, monitor, persists and got worse, diffuse on back , bilateral flank, bilateral groin, inner thighs Improved withsteroid, taper steroid to off Consider outpatient dermatology appointment l if rashreoccur  BPH Started flomax  FTT/extreme deconditioning, debility bed to wheel chair bound, needs Hoyer lift to get out of bed.  Discharge Diagnoses:  Principal Problem:   Severe sepsis with septic shock (HCC) Active Problems:   Venous stasis ulcers (HCC)   Type 2 diabetes mellitus without complication (HCC)   Current use of long term anticoagulation   Atrial fibrillation (HCC)   Acute respiratory failure with hypoxia (HCC)   Sepsis secondary to UTI (HCC)   Lobar pneumonia (HCC)   DVT (deep venous thrombosis) (HCC)    Discharge Instructions  Discharge Instructions    Diet - low sodium heart healthy   Complete by: As directed    Carb modified diet   Discharge instructions   Complete by: As directed    1)Please take prescribed medications as instructed 2)Do a CBC and BMP tests in a week. 3)Follow up with home health   Discharge wound care:   Complete by: As directed    Bilat anterior legs with scattered full thickness stasis ulcers (present on admission) Red and moist, small amt pink drainage, surrounded by dry peeling scaley skin.  Measurement:Left leg 4X3X.2cm, .8X.8X.2cm, 1X1X.2cm Right leg 5X5X.2cm and 1X1X.2cm Dressing procedure/placement/frequency:Topical treatment orders provided for bedside nurses to perform as follows to promote healing. Apply xeroform gauze to bilat leg wounds Q day, then cover with ABD pads and kerlex.  Per wound care recommendation: Wound need to be observed daily for the next 3 weeks ,wash legs with soap and water daily, then dressing change daily as directed above, dressing changes for both legs will take more than 30 minutes every day.    Discharge wound care:   Complete by: As directed    As per wound nurse   Increase activity slowly   Complete by: As directed      Allergies as of 11/03/2019   No Known Allergies     Medication List    STOP taking these medications   lisinopril 10 MG tablet Commonly known as: ZESTRIL   verapamil 120 MG tablet Commonly known as: CALAN     TAKE these medications   apixaban 5 MG Tabs tablet Commonly known as: ELIQUIS Take 1 tablet (5 mg total) by mouth 2 (two) times daily.   furosemide 40 MG tablet Commonly known as: Lasix Take 1 tablet (40 mg total) by mouth daily as needed for fluid or edema.   hydrocerin Crea Apply 1 application topically 2 (two) times daily.   metFORMIN 500 MG tablet Commonly known as: GLUCOPHAGE Take 500 mg by mouth 2 (two) times daily with a meal.   metoprolol tartrate 25 MG tablet Commonly known as: LOPRESSOR Take 0.5 tablets (12.5 mg total) by mouth 2 (two) times daily. What changed: how much to take   polyethylene glycol 17 g packet Commonly known as: MIRALAX / GLYCOLAX Take 17 g by mouth daily as needed for moderate constipation.   pravastatin 40 MG tablet Commonly known as: PRAVACHOL Take 40 mg by mouth daily.   predniSONE 10 MG tablet Commonly known as: DELTASONE Take 20mg  on 7/27, then 10mg  on 7/28, then stop.   tamsulosin 0.4 MG Caps capsule Commonly known as: FLOMAX Take 1 capsule (0.4 mg total) by mouth daily.   FlexTouch 100 UNIT/ML FlexTouch Pen Generic drug: insulin degludec Inject 26 Units into the skin daily.            Discharge Care Instructions  (From admission, onward)         Start     Ordered  11/03/19 0000  Discharge wound care:       Comments: As per wound nurse   11/03/19 1334   11/02/19 0000  Discharge wound care:        11/02/19 1357          Follow-up Information    Barbie BannerWilson, Fred H, MD Follow up in 1 week(s).   Specialty: Family Medicine Why: hospital discharge follow up, repeat  cbc/bmp at follow up. Contact information: [address] [place] [address] [phone]        Yates DecampGanji, Jay, MD Follow up in 3 week(s).   Specialty: Cardiology Why: for afib and diastolic heart failure management Contact information: [address] [place] [address] [phone]        [facility]              Follow up.   Why: bilateral lower extremity wound Contact information: [address] [place] [id number] [phone]             No Known Allergies  Consultations:  PCCM   Procedures/Studies: CT Head Wo Contrast  Result Date: 10/19/2019 CLINICAL DATA:  Altered level of consciousness, lethargy, intubated EXAM: CT HEAD WITHOUT CONTRAST TECHNIQUE: Contiguous axial images were obtained from the base of the skull through the vertex without intravenous contrast. COMPARISON:  07/20/2017 FINDINGS: Brain: No acute infarct or hemorrhage. Lateral ventricles and midline structures are unremarkable. No acute extra-axial fluid collections. No mass effect. Vascular: No hyperdense vessel or unexpected calcification. Skull: Normal. Negative for fracture or focal lesion. Sinuses/Orbits: No acute finding. Other: None. IMPRESSION: 1. No acute intracranial process. Electronically Signed   By: Sharlet SalinaMichael  Brown M.D.   On: 10/19/2019 21:30   DG CHEST PORT 1 VIEW  Result Date: 10/20/2019 CLINICAL DATA:  74 year old male central line placement. EXAM: PORTABLE CHEST 1 VIEW COMPARISON:  2307 hours yesterday. FINDINGS: Portable AP semi upright view at 0323 hours. New left IJ approach central line in place, tip at the SVC level above the carina. Stable endotracheal tube and enteric tube, the latter probably within a gastric hiatal hernia. No pneumothorax. Stable ventilation. Paucity of bowel gas in the upper abdomen. IMPRESSION: 1. Left IJ approach central line placed, tip at the SVC level. No pneumothorax. 2.  Otherwise stable lines and tubes. Suspect enteric tube terminates in a hiatal hernia. 3. Stable ventilation. Electronically Signed   By: Odessa FlemingH  Hall M.D.   On: 10/20/2019 03:34   DG Chest Portable 1 View  Result Date: 10/19/2019 CLINICAL DATA:  74 year old male status post enteric tube placement. EXAM: PORTABLE CHEST 1 VIEW COMPARISON:  Earlier radiograph dated 10/19/2019. FINDINGS: Endotracheal tube with tip approximately 3 cm above the carina. There has been interval placement of an enteric tube which appears to extend into the distal esophagus and possibly below the diaphragm. The tube however curves back with tip and side-port projecting over the right heart. This may be related to the presence of a hiatal hernia. Lateral view is recommended for better evaluation. There is shallow inspiration. There is diffuse hazy density throughout the right lung likely combination of layering pleural effusion and associated atelectasis or infiltrate. The left lung is clear. No pneumothorax. Stable cardiomegaly. No acute osseous pathology. IMPRESSION: 1. Endotracheal tube above the carina. 2. Interval placement of an enteric tube with tip and side-port projecting over the right heart. This may be related to the presence of a hiatal hernia. Lateral view  is recommended for better evaluation. 3. Right pleural effusion and associated atelectasis or infiltrate. Electronically Signed   By: Elgie Collard M.D.   On: 10/19/2019 23:23   DG Chest Portable 1 View  Result Date: 10/19/2019 CLINICAL DATA:  74 year old male with hypotension and altered mental status. Intubated. EXAM: PORTABLE CHEST 1 VIEW COMPARISON:  Portable chest 1636 hours. FINDINGS: Portable AP view at 2057 hours. Endotracheal tube tip projects between the level the clavicles and carina, with capacious appearing trachea/gas in the central mediastinum but no gaseous distension of the stomach to suggest an esophageal intubation. Small volume right pleural effusion  suspected. Stable lung volumes and ventilation from earlier. Stable cardiac size and mediastinal contours. Paucity of bowel gas in the upper abdomen. No acute osseous abnormality identified. IMPRESSION: 1. Endotracheal tube tip in satisfactory position. 2. Stable ventilation.  Small right pleural effusion suspected. Electronically Signed   By: Odessa Fleming M.D.   On: 10/19/2019 21:10   DG Chest Port 1 View  Result Date: 10/19/2019 CLINICAL DATA:  74 year old male with hypotension and altered mental status. EXAM: PORTABLE CHEST 1 VIEW COMPARISON:  06/02/2019 and prior radiographs FINDINGS: Cardiomegaly and pulmonary vascular congestion noted. There is a possible layering RIGHT pleural effusion. No pneumothorax. No acute bony abnormalities are present. IMPRESSION: Cardiomegaly with pulmonary vascular congestion and possible layering RIGHT pleural effusion. Electronically Signed   By: Harmon Pier M.D.   On: 10/19/2019 16:43   DG Abd Portable 1V  Result Date: 10/21/2019 CLINICAL DATA:  Orogastric tube placement EXAM: PORTABLE ABDOMEN - 1 VIEW COMPARISON:  None. FINDINGS: Orogastric tube tip and side port are in the distal stomach. There is no bowel dilatation or air-fluid level to suggest bowel obstruction. No free air. IMPRESSION: Orogastric tube tip and side port in distal stomach. No evident bowel obstruction or free air on supine examination. Electronically Signed   By: Bretta Bang III M.D.   On: 10/21/2019 09:43   ECHOCARDIOGRAM COMPLETE  Result Date: 10/20/2019    ECHOCARDIOGRAM REPORT   Patient Name:   LUCIANO CINQUEMANI Date of Exam: 10/20/2019 Medical Rec #:  [id number]       Height:       70.0 in Accession #:    [id number]      Weight:       304.9 lb Date of Birth:  Sep 20, 1945      BSA:          2.497 m Patient Age:    73 years        BP:           107/50 mmHg Patient Gender: M               HR:           70 bpm. Exam Location:  Inpatient Procedure: 2D Echo, Cardiac Doppler, Color Doppler and  Intracardiac            Opacification Agent Indications:    Acute Respiratory Insufficiency 518.82 / R06.89  History:        Patient has prior history of Echocardiogram examinations, most                 recent 07/21/2017. Pulmonary HTN, Arrythmias:Atrial Fibrillation                 and PVC, Signs/Symptoms:Hypotension; Risk Factors:Diabetes.  Sonographer:    Renella Cunas RDCS Referring Phys: [id number] Aliene Beams  Sonographer Comments: Technically difficult study due to poor echo windows  and echo performed with patient supine and on artificial respirator. IMPRESSIONS  1. Extremely limited; definity used; normal LV function.  2. Left ventricular ejection fraction, by estimation, is 55 to 60%. The left ventricle has normal function. The left ventricle has no regional wall motion abnormalities. Left ventricular diastolic parameters are indeterminate. Elevated left atrial pressure.  3. Right ventricular systolic function was not well visualized. The right ventricular size is not well visualized.  4. The mitral valve is normal in structure. Trivial mitral valve regurgitation. No evidence of mitral stenosis.  5. The aortic valve was not well visualized. Aortic valve regurgitation is not visualized. No aortic stenosis is present.  6. The inferior vena cava is dilated in size with >50% respiratory variability, suggesting right atrial pressure of 8 mmHg. FINDINGS  Left Ventricle: Left ventricular ejection fraction, by estimation, is 55 to 60%. The left ventricle has normal function. The left ventricle has no regional wall motion abnormalities. Definity contrast agent was given IV to delineate the left ventricular  endocardial borders. The left ventricular internal cavity size was normal in size. There is no left ventricular hypertrophy. Left ventricular diastolic parameters are indeterminate. Elevated left atrial pressure. Right Ventricle: The right ventricular size is not well visualized. Right vetricular wall thickness  was not assessed. Right ventricular systolic function was not well visualized. Left Atrium: Left atrial size was normal in size. Right Atrium: Right atrial size was not well visualized. Pericardium: There is no evidence of pericardial effusion. Mitral Valve: The mitral valve is normal in structure. Normal mobility of the mitral valve leaflets. Trivial mitral valve regurgitation. No evidence of mitral valve stenosis. Tricuspid Valve: The tricuspid valve is normal in structure. Tricuspid valve regurgitation is trivial. No evidence of tricuspid stenosis. Aortic Valve: The aortic valve was not well visualized. Aortic valve regurgitation is not visualized. No aortic stenosis is present. Pulmonic Valve: The pulmonic valve was not well visualized. Pulmonic valve regurgitation is not visualized. Aorta: The aortic root is normal in size and structure. Venous: The inferior vena cava is dilated in size with greater than 50% respiratory variability, suggesting right atrial pressure of 8 mmHg.  Additional Comments: Extremely limited; definity used; normal LV function.   Diastology LV e' lateral:   6.83 cm/s LV E/e' lateral: 12.8 LV e' medial:    4.73 cm/s LV E/e' medial:  18.4  LEFT ATRIUM           Index LA Vol (A4C): 31.1 ml 12.46 ml/m  AORTIC VALVE LVOT Vmax:   77.70 cm/s LVOT Vmean:  52.700 cm/s LVOT VTI:    0.161 m MITRAL VALVE MV Area (PHT): 2.56 cm    SHUNTS MV Decel Time: 296 msec    Systemic VTI: 0.16 m MV E velocity: 87.10 cm/s MV A velocity: 34.10 cm/s MV E/A ratio:  2.55 Olga Millers MD Electronically signed by Olga Millers MD Signature Date/Time: 10/20/2019/3:52:41 PM    Final        Subjective: Patient seen and examined at the bedside this afternoon.  Hemodynamically stable for discharge.  Discharge Exam: Vitals:   11/02/19 1927 11/03/19 0653  BP: 114/68 (!) 87/67  Pulse: 96 73  Resp: 20 19  Temp: 97.8 F (36.6 C) 97.6 F (36.4 C)  SpO2: 95% 96%   Vitals:   11/02/19 1456 11/02/19 1927  11/03/19 0623 11/03/19 0653  BP: 95/73 114/68  (!) 87/67  Pulse: 97 96  73  Resp:  20  19  Temp: 97.7 F (36.5 C) 97.8  F (36.6 C)  97.6 F (36.4 C)  TempSrc: Oral Oral  Oral  SpO2: 100% 95%  96%  Weight:   (!) 131.1 kg   Height:        General: Pt is alert, awake, not in acute distress, morbidly obese, extremity deconditioned, debilitated Cardiovascular: RRR, S1/S2 +, no rubs, no gallops Respiratory: CTA bilaterally, no wheezing, no rhonchi Abdominal: Soft, NT, ND, bowel sounds + Extremities: Trace bilateral lower extremity edema, no cyanosis, ulcerations on bilateral lower extremities    The results of significant diagnostics from this hospitalization (including imaging, microbiology, ancillary and laboratory) are listed below for reference.     Microbiology: Recent Results (from the past 240 hour(s))  SARS Coronavirus 2 by RT PCR (hospital order, performed in [facility] hospital lab) Nasopharyngeal Nasopharyngeal Swab     Status: None   Collection Time: 10/31/19  1:13 PM   Specimen: Nasopharyngeal Swab  Result Value Ref Range Status   SARS Coronavirus 2 NEGATIVE NEGATIVE Final    Comment: (NOTE) SARS-CoV-2 target nucleic acids are NOT DETECTED.  The SARS-CoV-2 RNA is generally detectable in upper and lower respiratory specimens during the acute phase of infection. The lowest concentration of SARS-CoV-2 viral copies this assay can detect is 250 copies / mL. A negative result does not preclude SARS-CoV-2 infection and should not be used as the sole basis for treatment or other patient management decisions.  A negative result may occur with improper specimen collection / handling, submission of specimen other than nasopharyngeal swab, presence of viral mutation(s) within the areas targeted by this assay, and inadequate number of viral copies (<250 copies / mL). A negative result must be combined with clinical observations, patient history, and epidemiological  information.  Fact Sheet for Patients:   BoilerBrush.com.cy  Fact Sheet for Healthcare Providers: https://pope.com/  This test is not yet approved or  cleared by the [place] FDA and has been authorized for detection and/or diagnosis of SARS-CoV-2 by FDA under an Emergency Use Authorization (EUA).  This EUA will remain in effect (meaning this test can be used) for the duration of the COVID-19 declaration under Section 564(b)(1) of the Act, 21 U.S.C. section 360bbb-3(b)(1), unless the authorization is terminated or revoked sooner.  Performed at [facility], [address] [place], [place] [address]      Labs: BNP (last 3 results) Recent Labs    05/13/19 1359 06/02/19 1645  BNP 502.0* 241.0*   Basic Metabolic Panel: Recent Labs  Lab 10/30/19 0231 11/01/19 0311  NA 140 139  K 3.8 4.7  CL 92* 92*  CO2 41* 41*  GLUCOSE 156* 287*  BUN 22 41*  CREATININE 0.72 0.62  CALCIUM 8.9 9.3  MG 1.6* 2.1   Liver Function Tests: Recent Labs  Lab 10/30/19 0231  AST 24  ALT 24  ALKPHOS 72  BILITOT 0.6  PROT 6.0*  ALBUMIN 2.6*   No results for input(s): LIPASE, AMYLASE in the last 168 hours. No results for input(s): AMMONIA in the last 168 hours. CBC: Recent Labs  Lab 10/30/19 0231  WBC 11.3*  NEUTROABS 8.1*  HGB 10.0*  HCT 36.0*  MCV 89.6  PLT 227   Cardiac Enzymes: No results for input(s): CKTOTAL, CKMB, CKMBINDEX, TROPONINI in the last 168 hours. BNP: Invalid input(s): POCBNP CBG: Recent Labs  Lab 11/02/19 0611 11/02/19 1137 11/02/19 1618 11/03/19 0610 11/03/19 1126  GLUCAP 185* 225* 312* 299* 220*   D-Dimer No results for input(s): DDIMER in the last  72 hours. Hgb A1c No results for input(s): HGBA1C in the last 72 hours. Lipid Profile No results for input(s): CHOL, HDL, LDLCALC, TRIG, CHOLHDL, LDLDIRECT in the last 72 hours. Thyroid function studies No results for input(s): TSH, T4TOTAL,  T3FREE, THYROIDAB in the last 72 hours.  Invalid input(s): FREET3 Anemia work up No results for input(s): VITAMINB12, FOLATE, FERRITIN, TIBC, IRON, RETICCTPCT in the last 72 hours. Urinalysis    Component Value Date/Time   COLORURINE YELLOW 10/20/2019 0845   APPEARANCEUR HAZY (A) 10/20/2019 0845   LABSPEC 1.012 10/20/2019 0845   PHURINE 5.0 10/20/2019 0845   GLUCOSEU NEGATIVE 10/20/2019 0845   HGBUR LARGE (A) 10/20/2019 0845   BILIRUBINUR NEGATIVE 10/20/2019 0845   KETONESUR NEGATIVE 10/20/2019 0845   PROTEINUR NEGATIVE 10/20/2019 0845   UROBILINOGEN 0.2 03/15/2014 1254   NITRITE NEGATIVE 10/20/2019 0845   LEUKOCYTESUR TRACE (A) 10/20/2019 0845   Sepsis Labs Invalid input(s): PROCALCITONIN,  WBC,  LACTICIDVEN Microbiology Recent Results (from the past 240 hour(s))  SARS Coronavirus 2 by RT PCR (hospital order, performed in [facility] hospital lab) Nasopharyngeal Nasopharyngeal Swab     Status: None   Collection Time: 10/31/19  1:13 PM   Specimen: Nasopharyngeal Swab  Result Value Ref Range Status   SARS Coronavirus 2 NEGATIVE NEGATIVE Final    Comment: (NOTE) SARS-CoV-2 target nucleic acids are NOT DETECTED.  The SARS-CoV-2 RNA is generally detectable in upper and lower respiratory specimens during the acute phase of infection. The lowest concentration of SARS-CoV-2 viral copies this assay can detect is 250 copies / mL. A negative result does not preclude SARS-CoV-2 infection and should not be used as the sole basis for treatment or other patient management decisions.  A negative result may occur with improper specimen collection / handling, submission of specimen other than nasopharyngeal swab, presence of viral mutation(s) within the areas targeted by this assay, and inadequate number of viral copies (<250 copies / mL). A negative result must be combined with clinical observations, patient history, and epidemiological information.  Fact Sheet for Patients:    BoilerBrush.com.cy  Fact Sheet for Healthcare Providers: https://pope.com/  This test is not yet approved or  cleared by the [place] FDA and has been authorized for detection and/or diagnosis of SARS-CoV-2 by FDA under an Emergency Use Authorization (EUA).  This EUA will remain in effect (meaning this test can be used) for the duration of the COVID-19 declaration under Section 564(b)(1) of the Act, 21 U.S.C. section 360bbb-3(b)(1), unless the authorization is terminated or revoked sooner.  Performed at [facility], [address] [place], [place] [address]     Please note: You were cared for by a hospitalist during your hospital stay. Once you are discharged, your primary care physician will handle any further medical issues. Please note that NO REFILLS for any discharge medications will be authorized once you are discharged, as it is imperative that you return to your primary care physician (or establish a relationship with a primary care physician if you do not have one) for your post hospital discharge needs so that they can reassess your need for medications and monitor your lab values.    Time coordinating discharge: 40 minutes  SIGNED:   Burnadette Pop, MD  [facility] 11/03/2019, 1:34 PM Pager [phone]  If 7PM-7AM, please contact night-coverage [email or web address] Password [email or web address]

## 2019-11-04 LAB — GLUCOSE, CAPILLARY
Glucose-Capillary: 170 mg/dL — ABNORMAL HIGH (ref 70–99)
Glucose-Capillary: 272 mg/dL — ABNORMAL HIGH (ref 70–99)

## 2019-11-04 NOTE — TOC Transition Note (Signed)
Transition of Care [facility]) - CM/SW Discharge Note   Patient Details  Name: Jonathan Wise MRN: [id number] Date of Birth: [birth year]  Transition of Care [facility]) CM/SW Contact:  Leone Haven, RN Phone Number: 11/04/2019, 11:33 AM   Clinical Narrative:    NCM made referral to Lupita Leash with [facility] she is able to take patient with soc on Thursday for [facility], [facility] and SW.  NCM informed wife, confirmed address for [facility].  Staff RN will send some dressing supplies home with patient.  [facility] called and scheduled for transport.   Final next level of care: Home w Home Health Services Barriers to Discharge: No Barriers Identified   Patient Goals and CMS Choice Patient states their goals for this hospitalization and ongoing recovery are:: get better CMS Medicare.gov Compare Post Acute Care list provided to:: Patient Choice offered to / list presented to : Patient  Discharge Placement                       Discharge Plan and Services                  DME Agency: NA       HH Arranged: RN, Nurse's Aide, Social Work [employer] Agency: [facility] Date HH Agency Contacted: 11/04/19 Time HH Agency Contacted: 1132 Representative spoke with at [facility] Agency: Lupita Leash  Social Determinants of Health (SDOH) Interventions     Readmission Risk Interventions Readmission Risk Prevention Plan 10/30/2019  Transportation Screening Complete  PCP or Specialist Appt within 5-7 Days Not Complete  Not Complete comments disposition pending  Home Care Screening Complete  Medication Review (RN CM) Referral to Pharmacy  Some recent data might be hidden

## 2019-11-04 NOTE — Plan of Care (Signed)
  Problem: Education: Goal: Knowledge of General Education information will improve Description: Including pain rating scale, medication(s)/side effects and non-pharmacologic comfort measures 11/04/2019 0749 by Alesia Morin, RN Outcome: Adequate for Discharge 11/04/2019 0747 by Alesia Morin, RN Outcome: Progressing   Problem: Health Behavior/Discharge Planning: Goal: Ability to manage health-related needs will improve 11/04/2019 0749 by Alesia Morin, RN Outcome: Adequate for Discharge 11/04/2019 0747 by Alesia Morin, RN Outcome: Progressing   Problem: Clinical Measurements: Goal: Ability to maintain clinical measurements within normal limits will improve 11/04/2019 0749 by Alesia Morin, RN Outcome: Adequate for Discharge 11/04/2019 0747 by Alesia Morin, RN Outcome: Progressing Goal: Will remain free from infection 11/04/2019 0749 by Alesia Morin, RN Outcome: Adequate for Discharge 11/04/2019 0747 by Alesia Morin, RN Outcome: Progressing Goal: Diagnostic test results will improve 11/04/2019 0749 by Alesia Morin, RN Outcome: Adequate for Discharge 11/04/2019 0747 by Alesia Morin, RN Outcome: Progressing Goal: Respiratory complications will improve 11/04/2019 0749 by Alesia Morin, RN Outcome: Adequate for Discharge 11/04/2019 0747 by Alesia Morin, RN Outcome: Progressing Goal: Cardiovascular complication will be avoided 11/04/2019 0749 by Alesia Morin, RN Outcome: Adequate for Discharge 11/04/2019 0747 by Alesia Morin, RN Outcome: Progressing   Problem: Activity: Goal: Risk for activity intolerance will decrease 11/04/2019 0749 by Alesia Morin, RN Outcome: Adequate for Discharge 11/04/2019 0747 by Alesia Morin, RN Outcome: Progressing   Problem: Nutrition: Goal: Adequate nutrition will be maintained 11/04/2019 0749 by Alesia Morin, RN Outcome: Adequate for Discharge 11/04/2019 0747 by  Alesia Morin, RN Outcome: Progressing   Problem: Coping: Goal: Level of anxiety will decrease 11/04/2019 0749 by Alesia Morin, RN Outcome: Adequate for Discharge 11/04/2019 0747 by Alesia Morin, RN Outcome: Progressing   Problem: Elimination: Goal: Will not experience complications related to bowel motility 11/04/2019 0749 by Alesia Morin, RN Outcome: Adequate for Discharge 11/04/2019 0747 by Alesia Morin, RN Outcome: Progressing Goal: Will not experience complications related to urinary retention 11/04/2019 0749 by Alesia Morin, RN Outcome: Adequate for Discharge 11/04/2019 0747 by Alesia Morin, RN Outcome: Progressing   Problem: Pain Managment: Goal: General experience of comfort will improve 11/04/2019 0749 by Alesia Morin, RN Outcome: Adequate for Discharge 11/04/2019 0747 by Alesia Morin, RN Outcome: Progressing   Problem: Safety: Goal: Ability to remain free from injury will improve 11/04/2019 0749 by Alesia Morin, RN Outcome: Adequate for Discharge 11/04/2019 0747 by Alesia Morin, RN Outcome: Progressing   Problem: Skin Integrity: Goal: Risk for impaired skin integrity will decrease 11/04/2019 0749 by Alesia Morin, RN Outcome: Adequate for Discharge 11/04/2019 0747 by Alesia Morin, RN Outcome: Progressing  Pt going home with home health, pt is bed bound.

## 2019-11-04 NOTE — Plan of Care (Signed)
Problem: Education: Goal: Knowledge of General Education information will improve Description: Including pain rating scale, medication(s)/side effects and non-pharmacologic comfort measures 11/04/2019 1229 by Don Perking, RN Outcome: Completed/Met 11/04/2019 0749 by Don Perking, RN Outcome: Adequate for Discharge 11/04/2019 0747 by Don Perking, RN Outcome: Progressing   Problem: Health Behavior/Discharge Planning: Goal: Ability to manage health-related needs will improve 11/04/2019 1229 by Don Perking, RN Outcome: Completed/Met 11/04/2019 0749 by Don Perking, RN Outcome: Adequate for Discharge 11/04/2019 0747 by Don Perking, RN Outcome: Progressing   Problem: Clinical Measurements: Goal: Ability to maintain clinical measurements within normal limits will improve 11/04/2019 1229 by Don Perking, RN Outcome: Completed/Met 11/04/2019 0749 by Don Perking, RN Outcome: Adequate for Discharge 11/04/2019 0747 by Don Perking, RN Outcome: Progressing Goal: Will remain free from infection 11/04/2019 1229 by Don Perking, RN Outcome: Completed/Met 11/04/2019 0749 by Don Perking, RN Outcome: Adequate for Discharge 11/04/2019 0747 by Don Perking, RN Outcome: Progressing Goal: Diagnostic test results will improve 11/04/2019 1229 by Don Perking, RN Outcome: Completed/Met 11/04/2019 0749 by Don Perking, RN Outcome: Adequate for Discharge 11/04/2019 0747 by Don Perking, RN Outcome: Progressing Goal: Respiratory complications will improve 11/04/2019 1229 by Don Perking, RN Outcome: Completed/Met 11/04/2019 0749 by Don Perking, RN Outcome: Adequate for Discharge 11/04/2019 0747 by Don Perking, RN Outcome: Progressing Goal: Cardiovascular complication will be avoided 11/04/2019 1229 by Don Perking, RN Outcome: Completed/Met 11/04/2019 0749 by Don Perking, RN Outcome:  Adequate for Discharge 11/04/2019 0747 by Don Perking, RN Outcome: Progressing   Problem: Activity: Goal: Risk for activity intolerance will decrease 11/04/2019 1229 by Don Perking, RN Outcome: Completed/Met 11/04/2019 0749 by Don Perking, RN Outcome: Adequate for Discharge 11/04/2019 0747 by Don Perking, RN Outcome: Progressing   Problem: Nutrition: Goal: Adequate nutrition will be maintained 11/04/2019 1229 by Don Perking, RN Outcome: Completed/Met 11/04/2019 0749 by Don Perking, RN Outcome: Adequate for Discharge 11/04/2019 0747 by Don Perking, RN Outcome: Progressing   Problem: Coping: Goal: Level of anxiety will decrease 11/04/2019 1229 by Don Perking, RN Outcome: Completed/Met 11/04/2019 0749 by Don Perking, RN Outcome: Adequate for Discharge 11/04/2019 0747 by Don Perking, RN Outcome: Progressing   Problem: Elimination: Goal: Will not experience complications related to bowel motility 11/04/2019 1229 by Don Perking, RN Outcome: Completed/Met 11/04/2019 0749 by Don Perking, RN Outcome: Adequate for Discharge 11/04/2019 0747 by Don Perking, RN Outcome: Progressing Goal: Will not experience complications related to urinary retention 11/04/2019 1229 by Don Perking, RN Outcome: Completed/Met 11/04/2019 0749 by Don Perking, RN Outcome: Adequate for Discharge 11/04/2019 0747 by Don Perking, RN Outcome: Progressing   Problem: Pain Managment: Goal: General experience of comfort will improve 11/04/2019 1229 by Don Perking, RN Outcome: Completed/Met 11/04/2019 0749 by Don Perking, RN Outcome: Adequate for Discharge 11/04/2019 0747 by Don Perking, RN Outcome: Progressing   Problem: Safety: Goal: Ability to remain free from injury will improve 11/04/2019 1229 by Don Perking, RN Outcome: Completed/Met 11/04/2019 0749 by Don Perking,  RN Outcome: Adequate for Discharge 11/04/2019 0747 by Don Perking, RN Outcome: Progressing   Problem: Skin Integrity: Goal: Risk for impaired skin integrity will decrease 11/04/2019 1229 by Don Perking, RN Outcome: Completed/Met 11/04/2019 0749 by Don Perking, RN Outcome: Adequate for Discharge 11/04/2019 0747 by Don Perking, RN  Outcome: Progressing

## 2019-11-04 NOTE — Plan of Care (Signed)

## 2019-11-04 NOTE — Care Management Important Message (Signed)
Important Message  Patient Details  Name: Jonathan Wise MRN: [id number] Date of Birth: [birth year]   Medicare Important Message Given:  Yes     Dorena Bodo 11/04/2019, 8:23 AM

## 2019-11-04 NOTE — Progress Notes (Signed)
Patient seen and examined at the bedside this morning.  He is medically stable for discharge as soon as home health is arranged.  DC summary and orders already in place.  No new changes in the plan.

## 2019-11-04 NOTE — TOC Progression Note (Addendum)
Transition of Care [facility]) - Progression Note    Patient Details  Name: Jonathan Wise MRN: [id number] Date of Birth: [birth year]  Transition of Care [facility]) CM/SW Contact  Nonda Lou, [place] Phone Number: 11/04/2019, 9:23 AM  Clinical Narrative:    Update: [facility] followed up, stated they are unable to take patient due to not being able to provide an RN. CSW attempted to make contact with patient's son granddaughter Lurena Joiner, left a voicemail.  CSW contacted Frances Furbish to follow-up on yesterday's referral. Referral currently still being reviewed.   Expected Discharge Plan: Skilled Nursing Facility Barriers to Discharge: English as a second language teacher, Continued Medical Work up  Expected Discharge Plan and Services Expected Discharge Plan: Skilled Nursing Facility       Living arrangements for the past 2 months: Single Family Home Expected Discharge Date: 11/04/19                                     Social Determinants of Health (SDOH) Interventions    Readmission Risk Interventions Readmission Risk Prevention Plan 10/30/2019  Transportation Screening Complete  PCP or Specialist Appt within 5-7 Days Not Complete  Not Complete comments disposition pending  Home Care Screening Complete  Medication Review (RN CM) Referral to Pharmacy  Some recent data might be hidden

## 2019-11-04 NOTE — Progress Notes (Signed)
Pt going home via [facility], extra dressing supplies given for wound care on legs. Prescriptions sent electronically to [facility]. No complaints at this time

## 2019-12-03 ENCOUNTER — Encounter (HOSPITAL_COMMUNITY): Payer: Self-pay | Admitting: Emergency Medicine

## 2019-12-03 ENCOUNTER — Other Ambulatory Visit: Payer: Self-pay

## 2019-12-03 ENCOUNTER — Emergency Department (HOSPITAL_COMMUNITY): Payer: Medicare Other

## 2019-12-03 ENCOUNTER — Inpatient Hospital Stay (HOSPITAL_COMMUNITY)
Admission: EM | Admit: 2019-12-03 | Discharge: 2019-12-07 | DRG: 871 | Disposition: A | Discharge status: Skilled Nursing Facility | Payer: Medicare Other | Attending: Internal Medicine | Admitting: Internal Medicine

## 2019-12-03 DIAGNOSIS — I2721 Secondary pulmonary arterial hypertension: Secondary | ICD-10-CM | POA: Diagnosis present

## 2019-12-03 DIAGNOSIS — Z7189 Other specified counseling: Secondary | ICD-10-CM

## 2019-12-03 DIAGNOSIS — L98499 Non-pressure chronic ulcer of skin of other sites with unspecified severity: Secondary | ICD-10-CM | POA: Diagnosis present

## 2019-12-03 DIAGNOSIS — A419 Sepsis, unspecified organism: Secondary | ICD-10-CM

## 2019-12-03 DIAGNOSIS — Z7401 Bed confinement status: Secondary | ICD-10-CM

## 2019-12-03 DIAGNOSIS — A4159 Other Gram-negative sepsis: Principal | ICD-10-CM | POA: Diagnosis present

## 2019-12-03 DIAGNOSIS — I248 Other forms of acute ischemic heart disease: Secondary | ICD-10-CM | POA: Diagnosis present

## 2019-12-03 DIAGNOSIS — I878 Other specified disorders of veins: Secondary | ICD-10-CM | POA: Diagnosis present

## 2019-12-03 DIAGNOSIS — Y92009 Unspecified place in unspecified non-institutional (private) residence as the place of occurrence of the external cause: Secondary | ICD-10-CM

## 2019-12-03 DIAGNOSIS — J9611 Chronic respiratory failure with hypoxia: Secondary | ICD-10-CM | POA: Diagnosis present

## 2019-12-03 DIAGNOSIS — Z91128 Patient's intentional underdosing of medication regimen for other reason: Secondary | ICD-10-CM

## 2019-12-03 DIAGNOSIS — D638 Anemia in other chronic diseases classified elsewhere: Secondary | ICD-10-CM | POA: Diagnosis present

## 2019-12-03 DIAGNOSIS — R54 Age-related physical debility: Secondary | ICD-10-CM | POA: Diagnosis present

## 2019-12-03 DIAGNOSIS — Z87442 Personal history of urinary calculi: Secondary | ICD-10-CM

## 2019-12-03 DIAGNOSIS — N390 Urinary tract infection, site not specified: Secondary | ICD-10-CM | POA: Diagnosis not present

## 2019-12-03 DIAGNOSIS — E785 Hyperlipidemia, unspecified: Secondary | ICD-10-CM | POA: Diagnosis present

## 2019-12-03 DIAGNOSIS — G9341 Metabolic encephalopathy: Secondary | ICD-10-CM | POA: Diagnosis present

## 2019-12-03 DIAGNOSIS — Z87891 Personal history of nicotine dependence: Secondary | ICD-10-CM

## 2019-12-03 DIAGNOSIS — L97929 Non-pressure chronic ulcer of unspecified part of left lower leg with unspecified severity: Secondary | ICD-10-CM | POA: Diagnosis present

## 2019-12-03 DIAGNOSIS — Z79899 Other long term (current) drug therapy: Secondary | ICD-10-CM

## 2019-12-03 DIAGNOSIS — Z794 Long term (current) use of insulin: Secondary | ICD-10-CM

## 2019-12-03 DIAGNOSIS — Z833 Family history of diabetes mellitus: Secondary | ICD-10-CM

## 2019-12-03 DIAGNOSIS — R6520 Severe sepsis without septic shock: Secondary | ICD-10-CM | POA: Diagnosis present

## 2019-12-03 DIAGNOSIS — E119 Type 2 diabetes mellitus without complications: Secondary | ICD-10-CM | POA: Diagnosis present

## 2019-12-03 DIAGNOSIS — I480 Paroxysmal atrial fibrillation: Secondary | ICD-10-CM | POA: Diagnosis present

## 2019-12-03 DIAGNOSIS — I4891 Unspecified atrial fibrillation: Secondary | ICD-10-CM

## 2019-12-03 DIAGNOSIS — Z20822 Contact with and (suspected) exposure to covid-19: Secondary | ICD-10-CM | POA: Diagnosis present

## 2019-12-03 DIAGNOSIS — Z7901 Long term (current) use of anticoagulants: Secondary | ICD-10-CM

## 2019-12-03 DIAGNOSIS — R778 Other specified abnormalities of plasma proteins: Secondary | ICD-10-CM

## 2019-12-03 DIAGNOSIS — I5033 Acute on chronic diastolic (congestive) heart failure: Secondary | ICD-10-CM | POA: Diagnosis present

## 2019-12-03 DIAGNOSIS — L97919 Non-pressure chronic ulcer of unspecified part of right lower leg with unspecified severity: Secondary | ICD-10-CM | POA: Diagnosis present

## 2019-12-03 DIAGNOSIS — T501X6A Underdosing of loop [high-ceiling] diuretics, initial encounter: Secondary | ICD-10-CM | POA: Diagnosis present

## 2019-12-03 DIAGNOSIS — Z66 Do not resuscitate: Secondary | ICD-10-CM | POA: Diagnosis present

## 2019-12-03 DIAGNOSIS — R4781 Slurred speech: Secondary | ICD-10-CM | POA: Diagnosis present

## 2019-12-03 DIAGNOSIS — Z86718 Personal history of other venous thrombosis and embolism: Secondary | ICD-10-CM

## 2019-12-03 DIAGNOSIS — Z6837 Body mass index (BMI) 37.0-37.9, adult: Secondary | ICD-10-CM

## 2019-12-03 DIAGNOSIS — Z825 Family history of asthma and other chronic lower respiratory diseases: Secondary | ICD-10-CM

## 2019-12-03 DIAGNOSIS — I110 Hypertensive heart disease with heart failure: Secondary | ICD-10-CM | POA: Diagnosis present

## 2019-12-03 DIAGNOSIS — R402412 Glasgow coma scale score 13-15, at arrival to emergency department: Secondary | ICD-10-CM

## 2019-12-03 LAB — URINALYSIS, ROUTINE W REFLEX MICROSCOPIC
Bilirubin Urine: NEGATIVE
Glucose, UA: NEGATIVE mg/dL
Ketones, ur: NEGATIVE mg/dL
Nitrite: POSITIVE — AB
Protein, ur: 100 mg/dL — AB
RBC / HPF: >50 RBC/hpf — ABNORMAL HIGH (ref 0–5)
Specific Gravity, Urine: 1.017 (ref 1.005–1.030)
WBC, UA: >50 WBC/hpf — ABNORMAL HIGH (ref 0–5)
pH: 6 (ref 5.0–8.0)

## 2019-12-03 LAB — CBC
HCT: 35.4 % — ABNORMAL LOW (ref 39.0–52.0)
Hemoglobin: 9.1 g/dL — ABNORMAL LOW (ref 13.0–17.0)
MCH: 24.7 pg — ABNORMAL LOW (ref 26.0–34.0)
MCHC: 25.7 g/dL — ABNORMAL LOW (ref 30.0–36.0)
MCV: 96.2 fL (ref 80.0–100.0)
Platelets: 311 10*3/uL (ref 150–400)
RBC: 3.68 MIL/uL — ABNORMAL LOW (ref 4.22–5.81)
RDW: 15.8 % — ABNORMAL HIGH (ref 11.5–15.5)
WBC: 13.3 10*3/uL — ABNORMAL HIGH (ref 4.0–10.5)
nRBC: 0 % (ref 0.0–0.2)

## 2019-12-03 LAB — BASIC METABOLIC PANEL
Anion gap: 12 (ref 5–15)
BUN: 24 mg/dL — ABNORMAL HIGH (ref 8–23)
CO2: 41 mmol/L — ABNORMAL HIGH (ref 22–32)
Calcium: 8.9 mg/dL (ref 8.9–10.3)
Chloride: 91 mmol/L — ABNORMAL LOW (ref 98–111)
Creatinine, Ser: 0.66 mg/dL (ref 0.61–1.24)
GFR calc Af Amer: >60 mL/min (ref >60)
GFR calc non Af Amer: >60 mL/min (ref >60)
Glucose, Bld: 108 mg/dL — ABNORMAL HIGH (ref 70–99)
Potassium: 4.8 mmol/L (ref 3.5–5.1)
Sodium: 144 mmol/L (ref 135–145)

## 2019-12-03 LAB — TROPONIN I (HIGH SENSITIVITY)
Troponin I (High Sensitivity): 21 ng/L — ABNORMAL HIGH (ref <18)
Troponin I (High Sensitivity): 22 ng/L — ABNORMAL HIGH (ref <18)

## 2019-12-03 LAB — BRAIN NATRIURETIC PEPTIDE: B Natriuretic Peptide: 178 pg/mL — ABNORMAL HIGH (ref 0.0–100.0)

## 2019-12-03 MED ORDER — LACTATED RINGERS IV BOLUS (SEPSIS)
2000.0000 mL | Freq: Once | INTRAVENOUS | Status: DC
  Administered 2019-12-03: 2000 mL via INTRAVENOUS

## 2019-12-03 MED ORDER — LACTATED RINGERS IV SOLN: INTRAVENOUS | Status: DC

## 2019-12-03 MED ORDER — SODIUM CHLORIDE 0.9 % IV SOLN
1.0000 g | INTRAVENOUS | Status: DC
  Administered 2019-12-03: 1 g via INTRAVENOUS
  Filled 2019-12-03: qty 10

## 2019-12-03 NOTE — ED Notes (Signed)
Pt gives permission to speak with granddaughter Netty Starring regarding pt condition. Pt wife is very HOH and granddaughter will relay information to pt spouse.

## 2019-12-03 NOTE — ED Provider Notes (Addendum)
[facility] EMERGENCY DEPARTMENT Provider Note   CSN: [id number] Arrival date & time: 12/03/19  1859     History Chief Complaint  Patient presents with  . Shortness of Breath    Jonathan Wise is a 74 y.o. male.  HPI     74 year old male comes in a chief complaint of shortness of breath.  He has history of DVT, pulmonary artery hypertension, CHF and is on chronic oxygen.  He is also bedbound and has venous stasis ulcers and sacral ulcers.  Patient is coming from home.  Home health nurse were concerned that patient was getting worse with the swelling and with his shortness of breath.  Patient has not been taking his Lasix as prescribed.  Patient has no complaints from his side.  He denies any new abdominal pain, nausea, vomiting, cough, chest pain, shortness of breath, fevers, chills.  He reports that he is feeling more tired than usual.  He is not ambulating.  At 11 PM I was able to get in touch with patient's granddaughter who is at the bedside.  She reports that patient never has fully recovered since his discharge.  He has been more " foggy" now and is not eating and drinking well.  She reports that she noted blood in the urine earlier today.   Past Medical History:  Diagnosis Date  . Atrial fibrillation (HCC)    On Pradaxa  . Bilateral lower extremity edema   . Cellulitis   . DVT (deep venous thrombosis) (HCC) 1994   LLE. Completed tx with coumadin  . Hyperlipidemia   . Hypertension   . Kidney stones   . Morbid obesity (HCC)   . Pressure ulcer   . Pulmonary artery hypertension (HCC) 10/18/2011  . Sleep apnea   . Type 2 diabetes mellitus (HCC)   . Urinary retention 10/15/2011  . Venous stasis ulcers [facility])     Patient Active Problem List   Diagnosis Date Noted  . Severe sepsis with septic shock (HCC) 10/24/2019  . Sepsis secondary to UTI (HCC) 10/24/2019  . Lobar pneumonia (HCC) 10/24/2019  . DVT (deep venous thrombosis) (HCC) 10/24/2019  . Severe sepsis (HCC)  10/19/2019  . Sepsis (HCC) 10/19/2019  . Pressure injury of skin 06/19/2019  . 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 Bedbound 05/14/2019  . Former heavy tobacco smoker 05/14/2019  . Atrial fibrillation with rapid ventricular response (HCC) 05/13/2019  . Chronic osteoarthritis 01/01/2017  . Lower extremity weakness 01/01/2017  . Acute respiratory failure with hypoxia (HCC) 12/28/2016  . Anemia of chronic disease 12/25/2015  . Candidal intertrigo 12/22/2015  . 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 Bradycardia 03/15/2014  . Lactic acidosis 03/15/2014  . Atrial fibrillation (HCC) 06/23/2012  . PVC's (premature ventricular contractions) 06/23/2012  . Cellulitis of leg, left 06/22/2012  . Sepsis due to pneumonia (HCC) 06/22/2012  . Morbid obesity (HCC) 06/22/2012  . Osteoarthritis of right knee 06/22/2012  . Current use of long term anticoagulation 06/22/2012  . Pulmonary artery hypertension (HCC) 10/18/2011  . Bilateral lower extremity edema 10/14/2011  . Hypotension 10/14/2011  . Venous stasis ulcers (HCC) 10/14/2011  . Type 2 diabetes mellitus without complication (HCC) 10/14/2011    Past Surgical History:  Procedure Laterality Date  . CYSTOSCOPY  10/18/2011   Procedure: CYSTOSCOPY FLEXIBLE;  Surgeon: Ky Barban, MD;  Location: [facility];  Service: Urology;  Laterality: N/A;  . HERNIA REPAIR     Umbilical       Family  History  Problem Relation Age of Onset  . Diabetes Mother   . Emphysema Father     Social History   Tobacco Use  . Smoking status: Former Games developer  . Smokeless tobacco: Never Used  Substance Use Topics  . Alcohol use: No    Alcohol/week: 0.0 standard drinks  . Drug use: No    Home Medications Prior to Admission medications   Medication Sig Start Date End Date Taking? Authorizing Provider  apixaban (ELIQUIS) 5 MG TABS tablet Take 1 tablet (5 mg total) by mouth 2 (two) times daily. 07/22/17   Darlin Drop, DO    furosemide (LASIX) 40 MG tablet Take 1 tablet (40 mg total) by mouth daily as needed for fluid or edema. 11/03/19 12/03/19  Burnadette Pop, MD  hydrocerin (EUCERIN) CREA Apply 1 application topically 2 (two) times daily. 05/19/19   Erick Blinks, MD  metFORMIN (GLUCOPHAGE) 500 MG tablet Take 500 mg by mouth 2 (two) times daily with a meal.    [provider]  metoprolol tartrate (LOPRESSOR) 25 MG tablet Take 0.5 tablets (12.5 mg total) by mouth 2 (two) times daily. 11/03/19   Burnadette Pop, MD  polyethylene glycol (MIRALAX / GLYCOLAX) 17 g packet Take 17 g by mouth daily as needed for moderate constipation. 11/03/19   Burnadette Pop, MD  pravastatin (PRAVACHOL) 40 MG tablet Take 40 mg by mouth daily.    [provider]  predniSONE (DELTASONE) 10 MG tablet Take 20mg  on 7/27, then 10mg  on 7/28, then stop. 11/03/19   8/28, MD  tamsulosin (FLOMAX) 0.4 MG CAPS capsule Take 1 capsule (0.4 mg total) by mouth daily. 11/03/19   Burnadette Pop, MD  TRESIBA FLEXTOUCH 100 UNIT/ML FlexTouch Pen Inject 26 Units into the skin daily. 10/03/19   [provider]    Allergies    Patient has no known allergies.  Review of Systems   Review of Systems  Constitutional: Positive for activity change.  Respiratory: Negative for cough and shortness of breath.   Cardiovascular: Negative for chest pain.  Gastrointestinal: Negative for nausea and vomiting.  Genitourinary: Positive for hematuria.  All other systems reviewed and are negative.   Physical Exam Updated Vital Signs BP (!) 148/69   Pulse (!) 111   Temp 98.1 F (36.7 C) (Oral)   Resp (!) 33   Ht 6\' 1"  (1.854 m)   Wt 127 kg   SpO2 95%   BMI 36.94 kg/m   Physical Exam Constitutional:      Comments: Somnolent  HENT:     Head: Atraumatic.  Eyes:     Conjunctiva/sclera: Conjunctivae normal.     Pupils: Pupils are equal, round, and reactive to light.  Neck:     Vascular: No JVD.  Cardiovascular:     Rate and  Rhythm: Normal rate and regular rhythm.     Heart sounds: Normal heart sounds.  Pulmonary:     Effort: Pulmonary effort is normal. No respiratory distress.     Breath sounds: Normal breath sounds. No decreased breath sounds or wheezing.     Comments: Exam limited due to patient's body habitus Abdominal:     General: Bowel sounds are normal. There is no distension.     Palpations: Abdomen is soft.     Tenderness: There is no abdominal tenderness. There is no guarding or rebound.  Musculoskeletal:     Cervical back: Normal range of motion and neck supple.     Right lower leg: Edema present.  Left lower leg: Edema present.  Skin:    General: Skin is warm.  Neurological:     Cranial Nerves: No cranial nerve deficit.     ED Results / Procedures / Treatments   Labs (all labs ordered are listed, but only abnormal results are displayed) Labs Reviewed  CBC - Abnormal; Notable for the following components:      Result Value   WBC 13.3 (*)    RBC 3.68 (*)    Hemoglobin 9.1 (*)    HCT 35.4 (*)    MCH 24.7 (*)    MCHC 25.7 (*)    RDW 15.8 (*)    All other components within normal limits  BASIC METABOLIC PANEL - Abnormal; Notable for the following components:   Chloride 91 (*)    CO2 41 (*)    Glucose, Bld 108 (*)    BUN 24 (*)    All other components within normal limits  BRAIN NATRIURETIC PEPTIDE - Abnormal; Notable for the following components:   B Natriuretic Peptide 178.0 (*)    All other components within normal limits  URINALYSIS, ROUTINE W REFLEX MICROSCOPIC - Abnormal; Notable for the following components:   APPearance CLOUDY (*)    Hgb urine dipstick LARGE (*)    Protein, ur 100 (*)    Nitrite POSITIVE (*)    Leukocytes,Ua LARGE (*)    RBC / HPF >50 (*)    WBC, UA >50 (*)    Bacteria, UA FEW (*)    All other components within normal limits  TROPONIN I (HIGH SENSITIVITY) - Abnormal; Notable for the following components:   Troponin I (High Sensitivity) 21 (*)     All other components within normal limits  TROPONIN I (HIGH SENSITIVITY) - Abnormal; Notable for the following components:   Troponin I (High Sensitivity) 22 (*)    All other components within normal limits  CULTURE, BLOOD (ROUTINE X 2)  CULTURE, BLOOD (ROUTINE X 2)  URINE CULTURE  SARS CORONAVIRUS 2 BY RT PCR (HOSPITAL ORDER, PERFORMED IN [facility])  LACTIC ACID, PLASMA  LACTIC ACID, PLASMA  PROTIME-INR  APTT    EKG EKG Interpretation  Date/Time:  Wednesday December 03 2019 19:11:18 EDT Ventricular Rate:  85 PR Interval:    QRS Duration: 96 QT Interval:  329 QTC Calculation: 392 R Axis:   46 Text Interpretation: Atrial fibrillation Borderline repolarization abnormality No acute changes No significant change since last tracing Confirmed by Derwood Kaplan [id number]) on 12/03/2019 8:04:09 PM   Radiology CT Head Wo Contrast  Result Date: 12/03/2019 CLINICAL DATA:  Mental status changes EXAM: CT HEAD WITHOUT CONTRAST TECHNIQUE: Contiguous axial images were obtained from the base of the skull through the vertex without intravenous contrast. COMPARISON:  10/19/2019 FINDINGS: Brain: There is atrophy and chronic small vessel disease changes. No acute intracranial abnormality. Specifically, no hemorrhage, hydrocephalus, mass lesion, acute infarction, or significant intracranial injury. Vascular: No hyperdense vessel or unexpected calcification. Skull: No acute calvarial abnormality. Sinuses/Orbits: Visualized paranasal sinuses and mastoids clear. Orbital soft tissues unremarkable. Other: None IMPRESSION: Atrophy, chronic microvascular disease. No acute intracranial abnormality. Electronically Signed   By: Charlett Nose M.D.   On: 12/03/2019 22:13   DG Chest Portable 1 View  Result Date: 12/03/2019 CLINICAL DATA:  Shortness of breath EXAM: PORTABLE CHEST 1 VIEW COMPARISON:  10/20/2019 FINDINGS: Small bilateral pleural effusions. Cardiomegaly with central vascular congestion. No  pneumothorax. Aortic atherosclerosis. IMPRESSION: Cardiomegaly with central vascular congestion and small bilateral pleural effusions. Electronically  Signed   By: Jasmine PangKim  Fujinaga M.D.   On: 12/03/2019 19:48    Procedures .Critical Care Performed by: Derwood KaplanNanavati, Esti Demello, MD Authorized by: Derwood KaplanNanavati, Dejaun Vidrio, MD   Critical care provider statement:    Critical care time (minutes):  52   Critical care was necessary to treat or prevent imminent or life-threatening deterioration of the following conditions:  Sepsis   Critical care was time spent personally by me on the following activities:  Discussions with consultants, evaluation of patient's response to treatment, examination of patient, ordering and performing treatments and interventions, ordering and review of laboratory studies, ordering and review of radiographic studies, pulse oximetry, re-evaluation of patient's condition, obtaining history from patient or surrogate and review of old charts   (including critical care time)  Medications Ordered in ED Medications  lactated ringers infusion (has no administration in time range)  cefTRIAXone (ROCEPHIN) 1 g in sodium chloride 0.9 % 100 mL IVPB (1 g Intravenous New Bag/Given 12/03/19 2339)    ED Course  I have reviewed the triage vital signs and the nursing notes.  Pertinent labs & imaging results that were available during my care of the patient were reviewed by me and considered in my medical decision making (see chart for details).  Clinical Course as of Dec 03 2339  Wed Dec 03, 2019  2338 Patient has 2 SIRS criteria on vital signs along with urine that shows signs of infection.  Code sepsis will be activated.  Nitrite(!): POSITIVE [AN]    Clinical Course User Index [AN] Derwood KaplanNanavati, Teneisha Gignac, MD   MDM Rules/Calculators/A&P                          74 year old comes in a chief complaint of shortness of breath and weakness. He has multiple medical comorbidities and is on chronic oxygen.  His  initial exam was not indicative of any specific cause for his symptoms.  It appears that patient has been deconditioned since the time he was discharged from the hospital a month ago after being diagnosed with sepsis.  Ultimately we were able to get in touch with family who confirmed hematuria that is new along with worsening mental status change.  Patient's urine analysis is showing signs of infection will initiate antibiotic treatment for it.  Code sepsis activated.  CT head was ordered, it is negative for any acute findings.  Chest x-ray is not showing any clear signs of pneumonia and patient denies any cough and there is no new oxygen requirement.  For now we will give him ceftriaxone and admit him to the hospital.  Patient is not hemodynamically unstable and clinically he is not in septic shock.  Lactic acid is pending at this time.  We will place patient for admission.  Final Clinical Impression(s) / ED Diagnoses Final diagnoses:  Glasgow coma scale total score 13-15, at arrival to emergency department  Severe sepsis [facility](HCC)    Rx / DC Orders ED Discharge Orders    None       Derwood KaplanNanavati, Paton Crum, MD 12/03/19 2340    Derwood KaplanNanavati, Cniyah Sproull, MD 12/03/19 2348

## 2019-12-03 NOTE — ED Triage Notes (Signed)
Patient brought in by EMS for shortness of breath. Home Health Care Nurse called EMS for bilateral hand tremors and slurred speech that has been ongoing x 1 week. Patient refuses to take his lasix at home and has edema to bilateral legs. Patient is on 3 liters of oxygen at home. EMS reports oxygen saturations at 88 percent on 3 liters at home. Hx of A-fib and recent diagnosis of pneumonia.

## 2019-12-04 ENCOUNTER — Encounter (HOSPITAL_COMMUNITY): Payer: Self-pay | Admitting: Internal Medicine

## 2019-12-04 DIAGNOSIS — Z87442 Personal history of urinary calculi: Secondary | ICD-10-CM | POA: Diagnosis not present

## 2019-12-04 DIAGNOSIS — A419 Sepsis, unspecified organism: Secondary | ICD-10-CM | POA: Diagnosis not present

## 2019-12-04 DIAGNOSIS — Z20822 Contact with and (suspected) exposure to covid-19: Secondary | ICD-10-CM | POA: Diagnosis present

## 2019-12-04 DIAGNOSIS — R6520 Severe sepsis without septic shock: Secondary | ICD-10-CM

## 2019-12-04 DIAGNOSIS — R778 Other specified abnormalities of plasma proteins: Secondary | ICD-10-CM

## 2019-12-04 DIAGNOSIS — Z7189 Other specified counseling: Secondary | ICD-10-CM | POA: Diagnosis not present

## 2019-12-04 DIAGNOSIS — Z87891 Personal history of nicotine dependence: Secondary | ICD-10-CM | POA: Diagnosis not present

## 2019-12-04 DIAGNOSIS — Z794 Long term (current) use of insulin: Secondary | ICD-10-CM | POA: Diagnosis not present

## 2019-12-04 DIAGNOSIS — Z66 Do not resuscitate: Secondary | ICD-10-CM | POA: Diagnosis present

## 2019-12-04 DIAGNOSIS — Z7901 Long term (current) use of anticoagulants: Secondary | ICD-10-CM | POA: Diagnosis not present

## 2019-12-04 DIAGNOSIS — Z833 Family history of diabetes mellitus: Secondary | ICD-10-CM | POA: Diagnosis not present

## 2019-12-04 DIAGNOSIS — D638 Anemia in other chronic diseases classified elsewhere: Secondary | ICD-10-CM | POA: Diagnosis present

## 2019-12-04 DIAGNOSIS — I2721 Secondary pulmonary arterial hypertension: Secondary | ICD-10-CM | POA: Diagnosis present

## 2019-12-04 DIAGNOSIS — R319 Hematuria, unspecified: Secondary | ICD-10-CM | POA: Diagnosis not present

## 2019-12-04 DIAGNOSIS — Z515 Encounter for palliative care: Secondary | ICD-10-CM

## 2019-12-04 DIAGNOSIS — I110 Hypertensive heart disease with heart failure: Secondary | ICD-10-CM | POA: Diagnosis present

## 2019-12-04 DIAGNOSIS — N390 Urinary tract infection, site not specified: Secondary | ICD-10-CM | POA: Diagnosis present

## 2019-12-04 DIAGNOSIS — G9341 Metabolic encephalopathy: Secondary | ICD-10-CM | POA: Diagnosis present

## 2019-12-04 DIAGNOSIS — E785 Hyperlipidemia, unspecified: Secondary | ICD-10-CM | POA: Diagnosis present

## 2019-12-04 DIAGNOSIS — A4159 Other Gram-negative sepsis: Secondary | ICD-10-CM | POA: Diagnosis present

## 2019-12-04 DIAGNOSIS — I480 Paroxysmal atrial fibrillation: Secondary | ICD-10-CM | POA: Diagnosis present

## 2019-12-04 DIAGNOSIS — I5033 Acute on chronic diastolic (congestive) heart failure: Secondary | ICD-10-CM

## 2019-12-04 DIAGNOSIS — L97929 Non-pressure chronic ulcer of unspecified part of left lower leg with unspecified severity: Secondary | ICD-10-CM | POA: Diagnosis present

## 2019-12-04 DIAGNOSIS — I248 Other forms of acute ischemic heart disease: Secondary | ICD-10-CM | POA: Diagnosis present

## 2019-12-04 DIAGNOSIS — Y92009 Unspecified place in unspecified non-institutional (private) residence as the place of occurrence of the external cause: Secondary | ICD-10-CM | POA: Diagnosis not present

## 2019-12-04 DIAGNOSIS — Z825 Family history of asthma and other chronic lower respiratory diseases: Secondary | ICD-10-CM | POA: Diagnosis not present

## 2019-12-04 DIAGNOSIS — I878 Other specified disorders of veins: Secondary | ICD-10-CM | POA: Diagnosis present

## 2019-12-04 DIAGNOSIS — Z86718 Personal history of other venous thrombosis and embolism: Secondary | ICD-10-CM | POA: Diagnosis not present

## 2019-12-04 DIAGNOSIS — I4891 Unspecified atrial fibrillation: Secondary | ICD-10-CM | POA: Diagnosis not present

## 2019-12-04 DIAGNOSIS — J9611 Chronic respiratory failure with hypoxia: Secondary | ICD-10-CM | POA: Diagnosis present

## 2019-12-04 DIAGNOSIS — L97919 Non-pressure chronic ulcer of unspecified part of right lower leg with unspecified severity: Secondary | ICD-10-CM | POA: Diagnosis present

## 2019-12-04 LAB — TSH: TSH: 1.671 u[IU]/mL (ref 0.350–4.500)

## 2019-12-04 LAB — CBC
HCT: 36.2 % — ABNORMAL LOW (ref 39.0–52.0)
Hemoglobin: 9 g/dL — ABNORMAL LOW (ref 13.0–17.0)
MCH: 24.4 pg — ABNORMAL LOW (ref 26.0–34.0)
MCHC: 24.9 g/dL — ABNORMAL LOW (ref 30.0–36.0)
MCV: 98.1 fL (ref 80.0–100.0)
Platelets: 307 10*3/uL (ref 150–400)
RBC: 3.69 MIL/uL — ABNORMAL LOW (ref 4.22–5.81)
RDW: 15.9 % — ABNORMAL HIGH (ref 11.5–15.5)
WBC: 13.4 10*3/uL — ABNORMAL HIGH (ref 4.0–10.5)
nRBC: 0 % (ref 0.0–0.2)

## 2019-12-04 LAB — CBG MONITORING, ED
Glucose-Capillary: 98 mg/dL (ref 70–99)
Glucose-Capillary: 123 mg/dL — ABNORMAL HIGH (ref 70–99)
Glucose-Capillary: 113 mg/dL — ABNORMAL HIGH (ref 70–99)
Glucose-Capillary: 101 mg/dL — ABNORMAL HIGH (ref 70–99)
Glucose-Capillary: 123 mg/dL — ABNORMAL HIGH (ref 70–99)

## 2019-12-04 LAB — LACTIC ACID, PLASMA
Lactic Acid, Venous: 0.4 mmol/L — ABNORMAL LOW (ref 0.5–1.9)
Lactic Acid, Venous: 0.6 mmol/L (ref 0.5–1.9)

## 2019-12-04 LAB — PROTIME-INR
INR: 1.5 — ABNORMAL HIGH (ref 0.8–1.2)
Prothrombin Time: 17.4 seconds — ABNORMAL HIGH (ref 11.4–15.2)

## 2019-12-04 LAB — VITAMIN B12: Vitamin B-12: 258 pg/mL (ref 180–914)

## 2019-12-04 LAB — HEMOGLOBIN A1C
Hgb A1c MFr Bld: 5.6 % (ref 4.8–5.6)
Mean Plasma Glucose: 114.02 mg/dL

## 2019-12-04 LAB — APTT: aPTT: 36 seconds (ref 24–36)

## 2019-12-04 LAB — AMMONIA: Ammonia: 61 umol/L — ABNORMAL HIGH (ref 9–35)

## 2019-12-04 LAB — SARS CORONAVIRUS 2 BY RT PCR (HOSPITAL ORDER, PERFORMED IN ~~LOC~~ HOSPITAL LAB): SARS Coronavirus 2: NEGATIVE

## 2019-12-04 MED ORDER — ACETAMINOPHEN 650 MG RE SUPP: 650.0000 mg | Freq: Four times a day (QID) | RECTAL | Status: DC | PRN

## 2019-12-04 MED ORDER — INSULIN ASPART 100 UNIT/ML ~~LOC~~ SOLN
0.0000 [IU] | Freq: Three times a day (TID) | SUBCUTANEOUS | Status: DC
  Administered 2019-12-04: 1 [IU] via SUBCUTANEOUS
  Administered 2019-12-05 (×2): 2 [IU] via SUBCUTANEOUS
  Administered 2019-12-06 – 2019-12-07 (×3): 1 [IU] via SUBCUTANEOUS
  Filled 2019-12-04: qty 1

## 2019-12-04 MED ORDER — LACTATED RINGERS IV BOLUS (SEPSIS)
1000.0000 mL | Freq: Once | INTRAVENOUS | Status: DC
  Administered 2019-12-04: 1000 mL via INTRAVENOUS

## 2019-12-04 MED ORDER — METOPROLOL TARTRATE 5 MG/5ML IV SOLN: 5.0000 mg | INTRAVENOUS | Status: DC | PRN

## 2019-12-04 MED ORDER — INSULIN ASPART 100 UNIT/ML ~~LOC~~ SOLN: 0.0000 [IU] | Freq: Every day | SUBCUTANEOUS | Status: DC

## 2019-12-04 MED ORDER — ACETAMINOPHEN 325 MG PO TABS
650.0000 mg | ORAL_TABLET | Freq: Four times a day (QID) | ORAL | Status: DC | PRN
  Administered 2019-12-04: 650 mg via ORAL
  Filled 2019-12-04: qty 2

## 2019-12-04 MED ORDER — ENSURE ENLIVE PO LIQD
237.0000 mL | Freq: Two times a day (BID) | ORAL | Status: DC
  Administered 2019-12-05 – 2019-12-07 (×5): 237 mL via ORAL

## 2019-12-04 MED ORDER — FUROSEMIDE 10 MG/ML IJ SOLN
40.0000 mg | Freq: Once | INTRAMUSCULAR | Status: AC
  Administered 2019-12-04: 40 mg via INTRAVENOUS
  Filled 2019-12-04: qty 4

## 2019-12-04 MED ORDER — SODIUM CHLORIDE 0.9 % IV SOLN
2.0000 g | Freq: Three times a day (TID) | INTRAVENOUS | Status: DC
  Administered 2019-12-04 – 2019-12-07 (×11): 2 g via INTRAVENOUS
  Filled 2019-12-04 (×11): qty 2

## 2019-12-04 MED ORDER — METOPROLOL TARTRATE 25 MG PO TABS
12.5000 mg | ORAL_TABLET | Freq: Two times a day (BID) | ORAL | Status: DC
  Administered 2019-12-04 – 2019-12-07 (×5): 12.5 mg via ORAL
  Filled 2019-12-04 (×8): qty 1

## 2019-12-04 NOTE — ED Notes (Signed)
Breakfast tray delivered to patient. Tech was assisting with tray set up and client attempted to grab at this tech's chest. Initially though he was attempting to look at name badge, but he said we was not. Tech backed away and client then said "you look so strong, you should sit up here in bed with me while I eat." Tech informed patient that this was not appropriate behavior.

## 2019-12-04 NOTE — ED Notes (Signed)
Pt cleaned up after catheter became unattached. Pericare performed and patient readjusted in bed.

## 2019-12-04 NOTE — Progress Notes (Signed)
Patient ID: Jonathan Wise, male   DOB: [birth year], 74 y.o.   MRN: [id number] Patient admitted early this morning for shortness of breath and was found to have possible UTI and has been started on antibiotics as well as IV Lasix was given for CHF exacerbation.  Patient seen and examined at bedside.  I have reviewed patient medical records including this morning's H&P, current vitals, labs, medications myself.  Continue antibiotics.  Will give 1 more dose of IV Lasix this morning follow cultures.  Repeat a.m. labs.  Will need PT eval. will change CODE STATUS to DNR as patient was a DNR during last hospitalization.  Palliative care consult for goals of care discussion.

## 2019-12-04 NOTE — Progress Notes (Signed)
Pharmacy Antibiotic Note  Jonathan Wise is a 74 y.o. male admitted on 12/03/2019 with UTI.  Pharmacy has been consulted for Cefepime dosing.  Plan: D/c ceftriaxone Cefepime 2gm IV q8h Will f/u micro data, pt's clinical condition and renal function  Height: 6\' 1"  (185.4 cm) Weight: 127 kg (280 lb) IBW/kg (Calculated) : 79.9  Temp (24hrs), Avg:98.1 F (36.7 C), Min:98.1 F (36.7 C), Max:98.1 F (36.7 C)  Recent Labs  Lab 12/03/19 1947 12/03/19 2348  WBC 13.3*  --   CREATININE 0.66  --   LATICACIDVEN  --  0.4*    Estimated Creatinine Clearance: 113.1 mL/min (by C-G formula based on SCr of 0.66 mg/dL).    No Known Allergies  Antimicrobials this admission: 8/25 Ceftriaxone x 1 8/26 Cefepime >>   Microbiology results: 8/25 BCx:   UCx:     Thank you for allowing pharmacy to be a part of this patient's care.  9/25, PharmD, BCPS Please see amion for complete clinical pharmacist phone list 12/04/2019 2:03 AM

## 2019-12-04 NOTE — ED Notes (Signed)
Pt turned to left side. Pt denies pain, VSS.

## 2019-12-04 NOTE — H&P (Signed)
History and Physical    Jonathan Wise TKW:[id number] DOB: 12-03-1945 DOA: 12/03/2019  PCP: [name], MD Patient coming from: Home  Chief Complaint: Shortness of breath  HPI: Jonathan Wise is a 74 y.o. male with medical history significant of paroxysmal A. fib on Eliquis, chronic diastolic CHF, chronic respiratory failure on 3 L home oxygen, DVT, hypertension, hyperlipidemia, obesity (BMI 36.94), sleep apnea, insulin-dependent type 2 diabetes, venous stasis ulcers presenting to the ED via EMS for evaluation of shortness of breath. Home health nurse called EMS for evaluation of bilateral hand tremors and slurred speech that has been ongoing for 1 week. It is reported that patient has been refusing to take his Lasix at home and has bilateral lower extremity edema. He is chronically on 3 L home oxygen. EMS noted oxygen saturation of 88% on 3 L home oxygen. Patient was admitted to the hospital last month for septic shock secondary to UTI/right lower lobe pneumonia.  Patient is somnolent.  Oriented to person place only.  Appears confused.  States he is here because he has been confused and has had shortness of breath.  He has no other complaints.  Denies fevers, cough, chest pain, nausea, vomiting, abdominal pain, or dysuria.  No additional history could be obtained from him.   ED Course: Afebrile. Tachycardic and tachypneic. Not hypotensive. Satting well on 3 L home oxygen. WBC count 13.3. Lactic acid 0.4. Hemoglobin 9.1, no significant change from baseline. Platelet count 311k. Sodium 144, potassium 4.8, chloride 91, bicarb 41, BUN 24, creatinine 0.6, and glucose 108. Bicarb chronically elevated and at baseline. Initial high-sensitivity troponin mildly elevated at 21, repeat stable at 22. EKG without acute ischemic changes. BNP 178. UA with positive nitrite, large amount of leukocytes, greater than 50 RBCs, greater than 50 WBCs, and few bacteria. Urine culture pending. Blood culture x2 pending.  SARS-CoV-2 PCR test negative.  Chest x-ray showing cardiomegaly with central vascular congestion and small bilateral pleural effusions.  Head CT negative for acute intracranial abnormality.  Patient received ceftriaxone and 1 L IV fluid bolus.  Review of Systems:  All systems reviewed and apart from history of presenting illness, are negative.  Past Medical History:  Diagnosis Date  . Atrial fibrillation (HCC)    On Pradaxa  . Bilateral lower extremity edema   . Cellulitis   . DVT (deep venous thrombosis) (HCC) 1994   LLE. Completed tx with coumadin  . Hyperlipidemia   . Hypertension   . Kidney stones   . Morbid obesity (HCC)   . Pressure ulcer   . Pulmonary artery hypertension (HCC) 10/18/2011  . Sleep apnea   . Type 2 diabetes mellitus (HCC)   . Urinary retention 10/15/2011  . Venous stasis ulcers (HCC)     Past Surgical History:  Procedure Laterality Date  . CYSTOSCOPY  10/18/2011   Procedure: CYSTOSCOPY FLEXIBLE;  Surgeon: Ky Barban, MD;  Location: [facility];  Service: Urology;  Laterality: N/A;  . HERNIA REPAIR     Umbilical     reports that he has quit smoking. He has never used smokeless tobacco. He reports that he does not drink alcohol and does not use drugs.  No Known Allergies  Family History  Problem Relation Age of Onset  . Diabetes Mother   . Emphysema Father     Prior to Admission medications   Medication Sig Start Date End Date Taking? Authorizing Provider  apixaban (ELIQUIS) 5 MG TABS tablet Take 1 tablet (5 mg total)  by mouth 2 (two) times daily. 07/22/17   Darlin Drop, DO  furosemide (LASIX) 40 MG tablet Take 1 tablet (40 mg total) by mouth daily as needed for fluid or edema. 11/03/19 12/03/19  Burnadette Pop, MD  hydrocerin (EUCERIN) CREA Apply 1 application topically 2 (two) times daily. 05/19/19   Erick Blinks, MD  metFORMIN (GLUCOPHAGE) 500 MG tablet Take 500 mg by mouth 2 (two) times daily with a meal.    [provider]    metoprolol tartrate (LOPRESSOR) 25 MG tablet Take 0.5 tablets (12.5 mg total) by mouth 2 (two) times daily. 11/03/19   Burnadette Pop, MD  polyethylene glycol (MIRALAX / GLYCOLAX) 17 g packet Take 17 g by mouth daily as needed for moderate constipation. 11/03/19   Burnadette Pop, MD  pravastatin (PRAVACHOL) 40 MG tablet Take 40 mg by mouth daily.    [provider]  predniSONE (DELTASONE) 10 MG tablet Take 20mg  on 7/27, then 10mg  on 7/28, then stop. 11/03/19   8/28, MD  tamsulosin (FLOMAX) 0.4 MG CAPS capsule Take 1 capsule (0.4 mg total) by mouth daily. 11/03/19   Burnadette Pop, MD  TRESIBA FLEXTOUCH 100 UNIT/ML FlexTouch Pen Inject 26 Units into the skin daily. 10/03/19   [provider]    Physical Exam: Vitals:   12/04/19 0230 12/04/19 0300 12/04/19 0330 12/04/19 0400  BP: 132/73 (!) 144/75 (!) 115/28 (!) 150/79  Pulse: (!) 110 (!) 124 (!) 104 (!) 125  Resp: (!) 22 (!) 21 (!) 25 (!) 24  Temp:      TempSrc:      SpO2: (!) 89% 92% 97% 98%  Weight:      Height:        Physical Exam Constitutional:      General: He is not in acute distress.    Appearance: He is not diaphoretic.  HENT:     Head: Normocephalic and atraumatic.  Eyes:     Extraocular Movements: Extraocular movements intact.     Conjunctiva/sclera: Conjunctivae normal.  Cardiovascular:     Rate and Rhythm: Tachycardia present. Rhythm irregular.     Pulses: Normal pulses.  Pulmonary:     Effort: Pulmonary effort is normal.     Breath sounds: Rales present. No wheezing.     Comments: Setting in the upper 90s on 3 L home oxygen Abdominal:     General: Bowel sounds are normal. There is no distension.     Palpations: Abdomen is soft.     Tenderness: There is no abdominal tenderness.  Musculoskeletal:        General: No tenderness.     Cervical back: Normal range of motion and neck supple.     Comments: Mild pitting edema bilateral lower extremities  Skin:    General: Skin is warm and  dry.  Neurological:     General: No focal deficit present.     Mental Status: He is oriented to person, place, and time.     Labs on Admission: I have personally reviewed following labs and imaging studies  CBC: Recent Labs  Lab 12/03/19 1947  WBC 13.3*  HGB 9.1*  HCT 35.4*  MCV 96.2  PLT 311   Basic Metabolic Panel: Recent Labs  Lab 12/03/19 1947  NA 144  K 4.8  CL 91*  CO2 41*  GLUCOSE 108*  BUN 24*  CREATININE 0.66  CALCIUM 8.9   GFR: Estimated Creatinine Clearance: 113.1 mL/min (by C-G formula based on SCr of 0.66 mg/dL). Liver  Function Tests: No results for input(s): AST, ALT, ALKPHOS, BILITOT, PROT, ALBUMIN in the last 168 hours. No results for input(s): LIPASE, AMYLASE in the last 168 hours. No results for input(s): AMMONIA in the last 168 hours. Coagulation Profile: Recent Labs  Lab 12/03/19 2359  INR 1.5*   Cardiac Enzymes: No results for input(s): CKTOTAL, CKMB, CKMBINDEX, TROPONINI in the last 168 hours. BNP (last 3 results) No results for input(s): PROBNP in the last 8760 hours. HbA1C: No results for input(s): HGBA1C in the last 72 hours. CBG: Recent Labs  Lab 12/04/19 0224  GLUCAP 98   Lipid Profile: No results for input(s): CHOL, HDL, LDLCALC, TRIG, CHOLHDL, LDLDIRECT in the last 72 hours. Thyroid Function Tests: No results for input(s): TSH, T4TOTAL, FREET4, T3FREE, THYROIDAB in the last 72 hours. Anemia Panel: No results for input(s): VITAMINB12, FOLATE, FERRITIN, TIBC, IRON, RETICCTPCT in the last 72 hours. Urine analysis:    Component Value Date/Time   COLORURINE YELLOW 12/03/2019 2110   APPEARANCEUR CLOUDY (A) 12/03/2019 2110   LABSPEC 1.017 12/03/2019 2110   PHURINE 6.0 12/03/2019 2110   GLUCOSEU NEGATIVE 12/03/2019 2110   HGBUR LARGE (A) 12/03/2019 2110   BILIRUBINUR NEGATIVE 12/03/2019 2110   KETONESUR NEGATIVE 12/03/2019 2110   PROTEINUR 100 (A) 12/03/2019 2110   UROBILINOGEN 0.2 03/15/2014 1254   NITRITE POSITIVE (A)  12/03/2019 2110   LEUKOCYTESUR LARGE (A) 12/03/2019 2110    Radiological Exams on Admission: CT Head Wo Contrast  Result Date: 12/03/2019 CLINICAL DATA:  Mental status changes EXAM: CT HEAD WITHOUT CONTRAST TECHNIQUE: Contiguous axial images were obtained from the base of the skull through the vertex without intravenous contrast. COMPARISON:  10/19/2019 FINDINGS: Brain: There is atrophy and chronic small vessel disease changes. No acute intracranial abnormality. Specifically, no hemorrhage, hydrocephalus, mass lesion, acute infarction, or significant intracranial injury. Vascular: No hyperdense vessel or unexpected calcification. Skull: No acute calvarial abnormality. Sinuses/Orbits: Visualized paranasal sinuses and mastoids clear. Orbital soft tissues unremarkable. Other: None IMPRESSION: Atrophy, chronic microvascular disease. No acute intracranial abnormality. Electronically Signed   By: Charlett Nose M.D.   On: 12/03/2019 22:13   DG Chest Portable 1 View  Result Date: 12/03/2019 CLINICAL DATA:  Shortness of breath EXAM: PORTABLE CHEST 1 VIEW COMPARISON:  10/20/2019 FINDINGS: Small bilateral pleural effusions. Cardiomegaly with central vascular congestion. No pneumothorax. Aortic atherosclerosis. IMPRESSION: Cardiomegaly with central vascular congestion and small bilateral pleural effusions. Electronically Signed   By: Jasmine Pang M.D.   On: 12/03/2019 19:48    EKG: Independently reviewed. A. fib, rate controlled. No acute ischemic changes.  Assessment/Plan Principal Problem:   UTI (urinary tract infection) Active Problems:   Atrial fibrillation with RVR (HCC)   Sepsis (HCC)   Acute on chronic diastolic CHF (congestive heart failure) (HCC)   Elevated troponin   Sepsis secondary to UTI: Tachycardic and tachypneic.  Not hypotensive.  Labs showing mild leukocytosis with WBC count 13.3.  Lactic acid normal. UA with positive nitrite, large amount of leukocytes, greater than 50 RBCs, greater  than 50 WBCs, and few bacteria.   -Cefepime.  Patient received 1 L IV fluid bolus in the ED, will avoid giving additional fluid at this time as chest x-ray with evidence of volume overload.  Urine culture pending.  Blood culture x2 pending.  Acute on chronic diastolic CHF: Appears volume overloaded on exam.  It is reported that patient has been refusing to take Lasix at home.  BNP 178.  Chest x-ray showing cardiomegaly with central vascular congestion and  small bilateral pleural effusions.  Currently satting well on 3 L home oxygen. Last echo done 10/20/2019 showing normal systolic function with LVEF 55 to 60% and indeterminate diastolic parameters. -Blood pressure stable, will order IV Lasix 40 mg x 1.  Please reassess in the morning and order additional doses of Lasix.  Monitor intake and output, daily weights, and low-sodium diet with fluid restriction.  A. fib with RVR: Rate currently in in 110s to 120s.  Likely precipitated by sepsis/underlying infection and acutely decompensated CHF. -IV metoprolol PRN HR >120.  Resume Eliquis after pharmacy med rec is done.  Mild troponin elevation: Likely due to demand ischemia in the setting of sepsis/decompensated CHF/A. fib with RVR. Initial high-sensitivity troponin mildly elevated at 21, repeat stable at 22. EKG without acute ischemic changes.  Patient denies chest pain. -Cardiac monitoring  Acute metabolic encephalopathy: Likely due to underlying infection/UTI.  Per EMS, home health nurse reported bilateral hand tremors and slurred speech that has been ongoing for 1 week.  Neuro exam currently nonfocal.  Head CT negative for acute intracranial abnormality.  However, patient appears confused and is oriented to person and place only. -Continue treatment for UTI.  Check TSH, B12, and ammonia levels.  Insulin-dependent type 2 diabetes -Check A1c.  Sliding scale insulin.  Resume home long-acting insulin after pharmacy med rec is done.  Pharmacy med rec  pending.  DVT prophylaxis: Resume Eliquis after pharmacy med rec is done. Code Status: Full code.  Unable to discuss CODE STATUS with the patient at this time as he is confused.  Please readdress in a.m. Family Communication: No family available at this time. Disposition Plan: Status is: Inpatient  Remains inpatient appropriate because:IV treatments appropriate due to intensity of illness or inability to take PO and Inpatient level of care appropriate due to severity of illness   Dispo: The patient is from: Home              Anticipated d/c is to: SNF              Anticipated d/c date is: > 3 days              Patient currently is not medically stable to d/c.  The medical decision making on this patient was of high complexity and the patient is at high risk for clinical deterioration, therefore this is a level 3 visit.  John GiovanniVasundhra Pamella Samons MD [facility]  If 7PM-7AM, please contact night-coverage [email or web address]  12/04/2019, 4:33 AM

## 2019-12-04 NOTE — TOC Initial Note (Signed)
Transition of Care [facility]) - Initial/Assessment Note   Patient Details  Name: YOUSUF Wise MRN: [id number] Date of Birth: 1945-06-04  [facility]) CM/SW Contact:    Jonathan Schlein, LCSW Phone Number: 12/04/2019, 12:33 PM  Clinical Narrative: Patient is a 74 year male who was admitted for UTI. Patient has a history of A. fib, chronic diastolic CHF, chronic respiratory failure, DVT, hypertension, hyperlipidemia, obesity, sleep apnea, and insulin-dependent type 2 diabetes. Re-admission checklist completed due to high re-admission score.  TOC received consult for HH/DME needs. CSW spoke with Jonathan Wise Wise with [facility]. Patient is currently active with [facility] for [facility] services (RN/PT/aide). CSW spoke with patient with wife's, Jonathan Wise Wise, assistance due to altered mental status. Patient stated repeatedly, "I don't know what's real. What's going on?" Wife and patient reported patient currently has HH services. Patient has a hospital bed at home and receives his O2 from [place]. Patient reported his wife assists him with his medications. TOC to follow for discharge needs.  Expected Discharge Plan: Home w Home Health Services Barriers to Discharge: Continued Medical Work up  Patient Goals and CMS Choice Patient states their goals for this hospitalization and ongoing recovery are:: Discharge home with [facility] CMS Medicare.gov Compare Post Acute Care list provided to:: Patient Choice offered to / list presented to : Patient  Expected Discharge Plan and Services Expected Discharge Plan: Home w Home Health Services In-house Referral: Clinical Social Work, Hospice / Palliative Care Discharge Planning Services: NA Post Acute Care Choice: Home Health Living arrangements for the past 2 months: Single Family Home             DME Arranged: N/A DME Agency: NA HH Arranged: PT, RN, Nurse's Aide HH Agency: [facility] Date HH Agency Contacted: 12/04/19 Time HH Agency Contacted:  [phone] Representative spoke with at [facility] Agency: Jonathan Wise Wise  Prior Living Arrangements/Services Living arrangements for the past 2 months: Single Family Home Lives with:: Spouse Patient language and need for interpreter reviewed:: Yes Do Jonathan Wise feel safe going back to the place where Jonathan Wise live?: Yes      Need for Family Participation in Patient Care: Yes (Comment) (Patient has altered mental status) Care giver support system in place?: Yes (comment) Current home services: DME, Homehealth aide, Home PT, Home RN (DME: O2 2L/min [facility]); HH: [facility]) Criminal Activity/Legal Involvement Pertinent to Current Situation/Hospitalization: No - Comment as needed  Permission Sought/Granted Permission sought to share information with : Facility Medical sales representative, Family Supports Permission granted to share information with : Yes, Verbal Permission Granted Share Information with NAME: Jonathan Wise Wise (wife) Permission granted to share info w AGENCY: [facility]  Emotional Assessment Appearance:: Appears stated age Attitude/Demeanor/Rapport: Other (comment) (Confused) Affect (typically observed): Agitated Orientation: : Oriented to Self Alcohol / Substance Use: Not Applicable Psych Involvement: No (comment)  Admission diagnosis:  UTI (urinary tract infection) [N39.0] Patient Active Problem List   Diagnosis Date Noted  . UTI (urinary tract infection) 12/04/2019  . Acute on chronic diastolic CHF (congestive heart failure) (HCC) 12/04/2019  . Elevated troponin 12/04/2019  . Severe sepsis with septic shock (HCC) 10/24/2019  . Sepsis secondary to UTI (HCC) 10/24/2019  . Lobar pneumonia (HCC) 10/24/2019  . DVT (deep venous thrombosis) (HCC) 10/24/2019  . Severe sepsis (HCC) 10/19/2019  . Sepsis (HCC) 10/19/2019  . Pressure injury of skin 06/19/2019  . 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 Bedbound 05/14/2019  . Former heavy tobacco smoker 05/14/2019  .  Atrial fibrillation with RVR (HCC) 05/13/2019  . Chronic osteoarthritis 01/01/2017  . Lower extremity weakness 01/01/2017  . Acute respiratory failure with hypoxia (HCC) 12/28/2016  . Anemia of chronic disease 12/25/2015  . Candidal intertrigo 12/22/2015  . 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 Bradycardia 03/15/2014  . Lactic acidosis 03/15/2014  . Atrial fibrillation (HCC) 06/23/2012  . PVC's (premature ventricular contractions) 06/23/2012  . Cellulitis of leg, left 06/22/2012  . Sepsis due to pneumonia (HCC) 06/22/2012  . Morbid obesity (HCC) 06/22/2012  . Osteoarthritis of right knee 06/22/2012  . Current use of long term anticoagulation 06/22/2012  . Pulmonary artery hypertension (HCC) 10/18/2011  . Bilateral lower extremity edema 10/14/2011  . Hypotension 10/14/2011  . Venous stasis ulcers (HCC) 10/14/2011  . Type 2 diabetes mellitus without complication (HCC) 10/14/2011   PCP:  Jonathan Banner, MD Pharmacy:   [facility] [phone] - [place], [place] - [address] [place] [address] Phone: [phone] Fax: [phone]  Readmission Risk Interventions Readmission Risk Prevention Plan 12/04/2019 10/30/2019  Transportation Screening Complete Complete  PCP or Specialist Appt within 5-7 Days - Not Complete  Not Complete comments - disposition pending  Home Care Screening - Complete  Medication Review (RN CM) - Referral to Pharmacy  HRI or Home Care Consult Complete -  Social Work Consult for Recovery Care Planning/Counseling Complete -  Palliative Care Screening Not Complete -  Palliative Care Screening Not Complete Comments Palliative consulted; has not seen him yet -  Medication Review Oceanographer) Complete -  Some recent data might be hidden

## 2019-12-04 NOTE — Consult Note (Signed)
Consultation Note Date: 12/04/2019   Patient Name: Jonathan Wise  DOB: 1945/10/23  MRN: [id number]  Age / Sex: 74 y.o., male  PCP: [name], MD Referring Physician: Glade Lloyd, MD  Reason for Consultation: Establishing goals of care and Psychosocial/spiritual support  HPI/Patient Profile: 74 y.o. male  with past medical history of paroxysmal A. fib on Eliquis, chronic diastolic heart failure, chronic respiratory failure on 3 L of home oxygen, DVT, HTN/HLD, obesity, sleep apnea, insulin-dependent diabetes 2, venous stasis ulcers, admitted last month for septic shock secondary to UTI/right lower lobe pneumonia admitted on 12/03/2019 with sepsis secondary to UTI, acute on chronic diastolic heart failure.   Clinical Assessment and Goals of Care: I have reviewed medical records including EPIC notes, labs and imaging, received report from bedside nursing staff, examined the patient.    Mr. Cropper is lying quietly on the stretcher in the ED.  He appears acutely/chronically ill, morbidly obese, frail.  He will tell me his name, but not answering orientation questions.  Ask if he would like something to drink, and he asks me to give him some time to wake up.  I am not sure that he is able to make his basic needs known.  There is no family at bedside at this time.  Call to wife, Theodoros Stjames to discuss diagnosis prognosis, GOC, EOL wishes, disposition and options.  No answer, unable to leave voicemail.  Unfortunately, UTI in men is complicated and I expect that Mr. Goetze continue to develop urinary tract infections.  He appears very weak and deconditioned.  He would likely benefit from short-term rehab, which could easily transition to long-term care.  Conference with bedside nursing staff.  PMT to continue to follow.   HCPOA    NEXT OF KIN -wife, [place].     SUMMARY OF RECOMMENDATIONS     Continue to treat the treatable but no CPR or intubation Time for outcomes. Anticipate need for short-term rehab that may transition to long-term care   Code Status/Advance Care Planning:  DNR  Symptom Management:   Per hospitalist, no additional needs at this time.  Palliative Prophylaxis:   Frequent Pain Assessment and Turn Reposition  Additional Recommendations (Limitations, Scope, Preferences):  Treat the treatable but no CPR or intubation.  Psycho-social/Spiritual:   Desire for further Chaplaincy support:no  Additional Recommendations: Caregiving  Support/Resources and Education on Hospice  Prognosis:   Unable to determine, based on outcomes.  Discharge Planning: To be determined, based on outcomes.      Primary Diagnoses: Present on Admission: . UTI (urinary tract infection) . Sepsis (HCC)   I have reviewed the medical record, interviewed the patient and family, and examined the patient. The following aspects are pertinent.  Past Medical History:  Diagnosis Date  . Atrial fibrillation (HCC)    On Pradaxa  . Bilateral lower extremity edema   . Cellulitis   . DVT (deep venous thrombosis) (HCC) 1994   LLE. Completed tx with coumadin  . Hyperlipidemia   . Hypertension   .  Kidney stones   . Morbid obesity (HCC)   . Pressure ulcer   . Pulmonary artery hypertension (HCC) 10/18/2011  . Sleep apnea   . Type 2 diabetes mellitus (HCC)   . Urinary retention 10/15/2011  . Venous stasis ulcers (HCC)    Social History   Socioeconomic History  . Marital status: Married    Spouse name: Not on file  . Number of children: Not on file  . Years of education: Not on file  . Highest education level: Not on file  Occupational History  . Not on file  Tobacco Use  . Smoking status: Former Games developer  . Smokeless tobacco: Never Used  Substance and Sexual Activity  . Alcohol use: No    Alcohol/week: 0.0 standard drinks  . Drug use: No  . Sexual activity: Not on  file  Other Topics Concern  . Not on file  Social History Narrative  . Not on file   Social Determinants of Health   Financial Resource Strain:   . Difficulty of Paying Living Expenses: Not on file  Food Insecurity:   . Worried About Programme researcher, broadcasting/film/video in the Last Year: Not on file  . Ran Out of Food in the Last Year: Not on file  Transportation Needs:   . Lack of Transportation (Medical): Not on file  . Lack of Transportation (Non-Medical): Not on file  Physical Activity:   . Days of Exercise per Week: Not on file  . Minutes of Exercise per Session: Not on file  Stress:   . Feeling of Stress : Not on file  Social Connections:   . Frequency of Communication with Friends and Family: Not on file  . Frequency of Social Gatherings with Friends and Family: Not on file  . Attends Religious Services: Not on file  . Active Member of Clubs or Organizations: Not on file  . Attends Banker Meetings: Not on file  . Marital Status: Not on file   Family History  Problem Relation Age of Onset  . Diabetes Mother   . Emphysema Father    Scheduled Meds: . insulin aspart  0-5 Units Subcutaneous QHS  . insulin aspart  0-9 Units Subcutaneous TID WC  . metoprolol tartrate  12.5 mg Oral BID   Continuous Infusions: . ceFEPime (MAXIPIME) IV 2 g (12/04/19 1446)   PRN Meds:.acetaminophen **OR** acetaminophen, metoprolol tartrate Medications Prior to Admission:  Prior to Admission medications   Medication Sig Start Date End Date Taking? Authorizing Provider  apixaban (ELIQUIS) 5 MG TABS tablet Take 1 tablet (5 mg total) by mouth 2 (two) times daily. 07/22/17  Yes Darlin Drop, DO  aspirin 81 MG EC tablet Take by mouth daily.   Yes [provider]  digoxin (LANOXIN) 0.125 MG tablet Take 1 tablet by mouth daily at 8 pm. 11/13/19  Yes [provider]  furosemide (LASIX) 40 MG tablet Take 1 tablet (40 mg total) by mouth daily as needed for fluid or edema. 11/03/19  12/04/19 Yes Burnadette Pop, MD  hydrocerin (EUCERIN) CREA Apply 1 application topically 2 (two) times daily. 05/19/19  Yes Erick Blinks, MD  metFORMIN (GLUCOPHAGE) 500 MG tablet Take 500 mg by mouth 2 (two) times daily with a meal.   Yes [provider]  metoprolol tartrate (LOPRESSOR) 25 MG tablet Take 0.5 tablets (12.5 mg total) by mouth 2 (two) times daily. 11/03/19  Yes Burnadette Pop, MD  polyethylene glycol (MIRALAX / GLYCOLAX) 17 g packet Take 17 g  by mouth daily as needed for moderate constipation. 11/03/19  Yes Burnadette Pop, MD  pravastatin (PRAVACHOL) 40 MG tablet Take 40 mg by mouth daily.   Yes [provider]  TRESIBA FLEXTOUCH 100 UNIT/ML FlexTouch Pen Inject 26 Units into the skin daily. 10/03/19  Yes [provider]  predniSONE (DELTASONE) 10 MG tablet Take 20mg  on 7/27, then 10mg  on 7/28, then stop. Patient not taking: Reported on 12/04/2019 11/03/19   12/06/2019, MD  tamsulosin (FLOMAX) 0.4 MG CAPS capsule Take 1 capsule (0.4 mg total) by mouth daily. 11/03/19   Burnadette Pop, MD   No Known Allergies Review of Systems  Unable to perform ROS: Acuity of condition    Physical Exam Vitals and nursing note reviewed.  Constitutional:      General: He is not in acute distress.    Appearance: He is obese. He is ill-appearing.  HENT:     Mouth/Throat:     Mouth: Mucous membranes are moist.  Cardiovascular:     Rate and Rhythm: Normal rate.  Abdominal:     Palpations: Abdomen is soft.     Comments: Obese abdomen  Musculoskeletal:     Right lower leg: Edema present.     Left lower leg: Edema present.  Skin:    General: Skin is warm and dry.     Comments: Sacral wound, scaling to bilateral lower extremities  Neurological:     Comments: Will tell me his name, but not where we are  Psychiatric:     Comments: Lethargic     Vital Signs: BP (!) 106/48   Pulse 87   Temp 98.1 F (36.7 C) (Oral)   Resp 11   Ht 6\' 1"  (1.854 m)   Wt 127 kg    SpO2 96%   BMI 36.94 kg/m  Pain Scale: 0-10   Pain Score: Asleep   SpO2: SpO2: 96 % O2 Device:SpO2: 96 % O2 Flow Rate: .O2 Flow Rate (L/min): 3 L/min  IO: Intake/output summary: No intake or output data in the 24 hours ending 12/04/19 1520  LBM:   Baseline Weight: Weight: 127 kg Most recent weight: Weight: 127 kg     Palliative Assessment/Data:   Flowsheet Rows     Most Recent Value  Intake Tab  Referral Department Hospitalist  Unit at Time of Referral Intermediate Care Unit  Palliative Care Primary Diagnosis Sepsis/Infectious Disease  Date Notified 12/04/19  Palliative Care Type New Palliative care  Reason for referral Clarify Goals of Care  Date of Admission 12/03/19  Date first seen by Palliative Care 12/04/19  # of days Palliative referral response time 0 Day(s)  # of days IP prior to Palliative referral 1  Clinical Assessment  Palliative Performance Scale Score 30%  Pain Max last 24 hours Not able to report  Pain Min Last 24 hours Not able to report  Dyspnea Max Last 24 Hours Not able to report  Dyspnea Min Last 24 hours Not able to report  Psychosocial & Spiritual Assessment  Palliative Care Outcomes      Time In: 1440  Time Out: 1510 Time Total: 30 minutes  Greater than 50%  of this time was spent counseling and coordinating care related to the above assessment and plan.  Signed by: 12/06/19, NP   Please contact Palliative Medicine Team phone at [phone] for questions and concerns.  For individual provider: See 12/06/19

## 2019-12-05 DIAGNOSIS — I5033 Acute on chronic diastolic (congestive) heart failure: Secondary | ICD-10-CM

## 2019-12-05 DIAGNOSIS — I4891 Unspecified atrial fibrillation: Secondary | ICD-10-CM

## 2019-12-05 LAB — BASIC METABOLIC PANEL
Anion gap: 9 (ref 5–15)
BUN: 22 mg/dL (ref 8–23)
CO2: 43 mmol/L — ABNORMAL HIGH (ref 22–32)
Calcium: 9 mg/dL (ref 8.9–10.3)
Chloride: 90 mmol/L — ABNORMAL LOW (ref 98–111)
Creatinine, Ser: 0.5 mg/dL — ABNORMAL LOW (ref 0.61–1.24)
GFR calc Af Amer: >60 mL/min (ref >60)
GFR calc non Af Amer: >60 mL/min (ref >60)
Glucose, Bld: 116 mg/dL — ABNORMAL HIGH (ref 70–99)
Potassium: 4.3 mmol/L (ref 3.5–5.1)
Sodium: 142 mmol/L (ref 135–145)

## 2019-12-05 LAB — CBC WITH DIFFERENTIAL/PLATELET
Abs Immature Granulocytes: 0.06 10*3/uL (ref 0.00–0.07)
Basophils Absolute: 0.1 10*3/uL (ref 0.0–0.1)
Basophils Relative: 1 %
Eosinophils Absolute: 0.3 10*3/uL (ref 0.0–0.5)
Eosinophils Relative: 2 %
HCT: 36.1 % — ABNORMAL LOW (ref 39.0–52.0)
Hemoglobin: 9 g/dL — ABNORMAL LOW (ref 13.0–17.0)
Immature Granulocytes: 1 %
Lymphocytes Relative: 8 %
Lymphs Abs: 0.9 10*3/uL (ref 0.7–4.0)
MCH: 24.3 pg — ABNORMAL LOW (ref 26.0–34.0)
MCHC: 24.9 g/dL — ABNORMAL LOW (ref 30.0–36.0)
MCV: 97.3 fL (ref 80.0–100.0)
Monocytes Absolute: 1.1 10*3/uL — ABNORMAL HIGH (ref 0.1–1.0)
Monocytes Relative: 9 %
Neutro Abs: 9.2 10*3/uL — ABNORMAL HIGH (ref 1.7–7.7)
Neutrophils Relative %: 79 %
Platelets: 316 10*3/uL (ref 150–400)
RBC: 3.71 MIL/uL — ABNORMAL LOW (ref 4.22–5.81)
RDW: 15.5 % (ref 11.5–15.5)
WBC: 11.6 10*3/uL — ABNORMAL HIGH (ref 4.0–10.5)
nRBC: 0 % (ref 0.0–0.2)

## 2019-12-05 LAB — GLUCOSE, CAPILLARY
Glucose-Capillary: 105 mg/dL — ABNORMAL HIGH (ref 70–99)
Glucose-Capillary: 167 mg/dL — ABNORMAL HIGH (ref 70–99)
Glucose-Capillary: 154 mg/dL — ABNORMAL HIGH (ref 70–99)
Glucose-Capillary: 92 mg/dL (ref 70–99)

## 2019-12-05 LAB — MAGNESIUM: Magnesium: 1.9 mg/dL (ref 1.7–2.4)

## 2019-12-05 MED ORDER — APIXABAN 5 MG PO TABS
5.0000 mg | ORAL_TABLET | Freq: Two times a day (BID) | ORAL | Status: DC
  Administered 2019-12-05 – 2019-12-07 (×5): 5 mg via ORAL
  Filled 2019-12-05 (×5): qty 1

## 2019-12-05 MED ORDER — FUROSEMIDE 10 MG/ML IJ SOLN
40.0000 mg | Freq: Two times a day (BID) | INTRAMUSCULAR | Status: DC
  Administered 2019-12-05 – 2019-12-07 (×5): 40 mg via INTRAVENOUS
  Filled 2019-12-05 (×5): qty 4

## 2019-12-05 MED ORDER — LACTULOSE 10 GM/15ML PO SOLN
10.0000 g | Freq: Two times a day (BID) | ORAL | Status: DC
  Administered 2019-12-05 – 2019-12-07 (×4): 10 g via ORAL
  Filled 2019-12-05 (×5): qty 30

## 2019-12-05 MED ORDER — OCUVITE-LUTEIN PO CAPS
1.0000 | ORAL_CAPSULE | Freq: Every day | ORAL | Status: DC
  Administered 2019-12-05 – 2019-12-07 (×3): 1 via ORAL
  Filled 2019-12-05 (×3): qty 1

## 2019-12-05 MED ORDER — PROSOURCE PLUS PO LIQD
30.0000 mL | Freq: Two times a day (BID) | ORAL | Status: DC
  Administered 2019-12-06 – 2019-12-07 (×4): 30 mL via ORAL
  Filled 2019-12-05 (×4): qty 30

## 2019-12-05 NOTE — Progress Notes (Signed)
Initial Nutrition Assessment  DOCUMENTATION CODES:   Obesity unspecified  INTERVENTION:  Continue Ensure Enlive po BID, each supplement provides 350 kcal and 20 grams of protein  ProSource Plus 30 ml po BID, each supplement provides 100 kcal and 15 grams protein  Ocuvite daily for wound healing (provides zinc, vitamin A, vitamin C, Vitamin E, copper, and selenium)  NUTRITION DIAGNOSIS:   Increased nutrient needs related to acute illness, chronic illness, wound healing (sepsis secondary to UTI, chronic respiratory failure on 3L at baseline, BLE venous stasis ulcers and stg II PI to buttocks poa) as evidenced by estimated needs.    GOAL:   Patient will meet greater than or equal to 90% of their needs    MONITOR:   Labs, I & O's, Supplement acceptance, PO intake, Weight trends  REASON FOR ASSESSMENT:   Malnutrition Screening Tool    ASSESSMENT:  RD working remotely.  74 year old male with history of paroxysmal Afib on Eliquis, chronic dCHF, chronic respiratory failure on 3L home O2, HTN, HLD, obesity, sleep apnea, IDDM2, venous stasis ulcers, and recent admission to hospital for septic shock secondary to UTI and pna presented with 1 week history of SOB, bilateral hand tremors, bilateral lower extremity edema, and slurred speech.  Patient admitted for sepsis secondary to UTI and acute on chronic dCHF.   CXR showed cardiomegaly with central vascular congestion and small bilateral pleural effusions. Per H&P pt was reported to have been refusing Lasix at home.   Diet modified to HH/CM with 1500 ml fluid restriction on 8/26, no documented meals at this time for review. He is ordered Ensure BID, per meds he as accepted 2 of 2 supplements today. Will continue this and will also order ProSource Plus to aid with meeting needs as well as Ocuvite MVI to support wound healing.   Per chart, weights have trended down 6.6 lb (2.3%) in the last month; insignificant. Mild pitting BLE edema  per RN assessment.  I/Os: -1495 ml since admit UOP: 1600 ml x 24 hrs Medications reviewed and include: Lasix 40 mg IV every 12 hrs, SSI, lactulose IVPB Maxipime Labs: CBGs 167,105,123,101, WBC 11.6 (H), Hgb 9 (L), HCT 36.1 (L)   NUTRITION - FOCUSED PHYSICAL EXAM: Unable to perform at this time, RD working remotely.  Diet Order:   Diet Order            Diet heart healthy/carb modified Room service appropriate? Yes; Fluid consistency: Thin; Fluid restriction: 1500 mL Fluid  Diet effective now                 EDUCATION NEEDS:   No education needs have been identified at this time  Skin:  Skin Assessment: Skin Integrity Issues: Skin Integrity Issues:: Stage II, Other (Comment) Stage II: buttocks Other: venous statis ulcer;R/L tibia  Last BM:  8/19  Height:   Ht Readings from Last 1 Encounters:  12/04/19 6\' 1"  (1.854 m)    Weight:   Wt Readings from Last 1 Encounters:  12/04/19 129.5 kg    BMI:  Body mass index is 37.67 kg/m.  Estimated Nutritional Needs:   Kcal:  2100-2300  Protein:  115-125  Fluid:  1.5 L/day per MD   06-12-1990, RD, LDN Clinical Nutrition After Hours/Weekend Pager # in Amion

## 2019-12-05 NOTE — Consult Note (Signed)
WOC Nurse Consult Note: Patient receiving care in [facility]. Consult completed remotely after review of record and phone conversation with primary RN, Jeannie Done. Reason for Consult: wounds to BLE Wound type: chronic venous stasis. Last photographed for our records on 10/30/19. Pressure Injury POA: Yes/No/NA Measurement: Wound bed: Drainage (amount, consistency, odor)  Periwound: Dressing procedure/placement/frequency: Remove existing wraps. Wash with soap and water. Pat dry. Apply Sween Moisturizing ointment. Place Xeroform gauzes over any open wounds [facility] [place] [phone]). Spiral wrap kerlex from just behind toes to just below knees, then the same with 4 inch ace bandages. Perform daily. Monitor the wound area(s) for worsening of condition such as: Signs/symptoms of infection,  Increase in size,  Development of or worsening of odor, Development of pain, or increased pain at the affected locations.  Notify the medical team if any of these develop.  Thank you for the consult.  WOC nurse will not follow at this time.  Please re-consult the WOC team if needed.  Helmut Muster, RN, MSN, CWOCN, CNS-BC, pager [phone]

## 2019-12-05 NOTE — TOC Progression Note (Signed)
Transition of Care [facility]) - Progression Note    Patient Details  Name: Jonathan Wise MRN: [id number] Date of Birth: 1946-03-05  Transition of Care [facility]) CM/SW Contact  Karn Cassis, [place] Phone Number: 12/05/2019, 2:59 PM  Clinical Narrative:  PT recommending SNF. LCSW discussed with pt and pt's wife who are agreeable. Pt requests [facility], [place], or [place]. Will initiate bed search. Pt states he has not had COVID vaccines. He is aware of 2 week quarantine at [facility] where he will not be able to have visitors.      Expected Discharge Plan: Home w Home Health Services Barriers to Discharge: Continued Medical Work up  Expected Discharge Plan and Services Expected Discharge Plan: Home w Home Health Services In-house Referral: Clinical Social Work, Hospice / Palliative Care Discharge Planning Services: NA Post Acute Care Choice: Home Health Living arrangements for the past 2 months: Single Family Home                 DME Arranged: N/A DME Agency: NA       HH Arranged: PT, RN, Nurse's Aide HH Agency: [facility] Date HH Agency Contacted: 12/04/19 Time HH Agency Contacted: [phone] Representative spoke with at [facility] Agency: Bonita Quin   Social Determinants of Health (SDOH) Interventions    Readmission Risk Interventions Readmission Risk Prevention Plan 12/04/2019 10/30/2019  Transportation Screening Complete Complete  PCP or Specialist Appt within 5-7 Days - Not Complete  Not Complete comments - disposition pending  Home Care Screening - Complete  Medication Review (RN CM) - Referral to Pharmacy  HRI or Home Care Consult Complete -  Social Work Consult for Recovery Care Planning/Counseling Complete -  Palliative Care Screening Not Complete -  Palliative Care Screening Not Complete Comments Palliative consulted; has not seen him yet -  Medication Review Oceanographer) Complete -  Some recent data might be hidden

## 2019-12-05 NOTE — Progress Notes (Addendum)
Patient ID: Jonathan Wise, male   DOB: Jul 13, 1945, 74 y.o.   MRN: [id number]  PROGRESS NOTE    Jonathan Wise  VEL:[id number] DOB: [birth year] DOA: 12/03/2019 PCP: [name], MD   Brief Narrative:  74 y.o. male with medical history significant of paroxysmal A. fib on Eliquis, chronic diastolic CHF, chronic respiratory failure on 3 L home oxygen, DVT, hypertension, hyperlipidemia, obesity, sleep apnea, insulin-dependent type 2 diabetes, venous stasis ulcers presented with worsening shortness of breath along with hand tremors and some slurring of speech.  On resolution, patient was tachycardic and tachypneic with a white count of 13.3.  UA was suggestive of UTI.  COVID-19 testing was negative.  Chest x-ray showed cardiomegaly with central vascular congestion and small bilateral pleural effusions.  CT of the head was negative for acute intracranial abnormality.  Patient was treated with IV fluids and antibiotics.  Assessment & Plan:   Sepsis secondary to UTI: Present on admission UTI -Presented with tachycardia, tachypnea, leukocytosis with urinalysis suggestive of UTI along with altered mental status -Treated with IV fluids in the ED and currently on cefepime.  Urine cultures growing gram-negative rods.  Follow identification and sensitivities.  Off IV fluids. -Afebrile and hemodynamically improving.  Acute on chronic diastolic CHF -Apparently patient has been refusing Lasix at home.  Chest x-ray showed cardiomegaly with central vascular congestion and small bilateral pleural effusions on presentation.  Echo on 10/20/2019 had shown normal systolic function with EF of 55 to 60% with indeterminate diastolic parameters -Strict input and output.  Daily weights. -Continue Lasix 40 mg IV every 12 hours.  Fluid restriction.  Paroxysmal A. fib with RVR -Heart rate improving.  Continue metoprolol and Eliquis.  Acute metabolic encephalopathy -Likely due to sepsis/UTI.  Head CT was negative for  acute intracranial abnormality. -B12 258.  TSH 1.671.  Ammonia was 61 on presentation.  Will start lactulose.  Monitor mental status.  If mental status does not improve, will consider MRI of the brain  Leukocytosis -Improving  Anemia of chronic disease -Questionable cause.  Hemoglobin stable.  No signs of bleeding  Chronic hypoxic respiratory failure -Respiratory status stable.  Currently requiring 3 L oxygen via nasal cannula.  Diabetes mellitus type 2 -A1c 5.6.  Continue CBGs with SSI  Generalized deconditioning -Overall prognosis is guarded to poor.  PT eval.  Palliative care evaluation appreciated.  Patient is a DNR.  If condition worsens, consider hospice/comfort measures  Bilateral lower extremity chronic venous stasis ulcers: Present on admission -Wound care consult    DVT prophylaxis: Eliquis Code Status: DNR Family Communication: called wife/[name] on phone on 12/05/19 but she didn't pick up Disposition Plan: Status is: Inpatient  Remains inpatient appropriate because:Inpatient level of care appropriate due to severity of illness   Dispo: The patient is from: Home              Anticipated d/c is to: Home              Anticipated d/c date is: 2 days              Patient currently is not medically stable to d/c.   Consultants: Palliative care  Procedures: none  Antimicrobials:  Anti-infectives (From admission, onward)    Start     Dose/Rate Route Frequency Ordered Stop   12/04/19 0300  ceFEPIme (MAXIPIME) 2 g in sodium chloride 0.9 % 100 mL IVPB        2 g 200 mL/hr over 30 Minutes Intravenous Every  8 hours 12/04/19 0207     12/03/19 2330  cefTRIAXone (ROCEPHIN) 1 g in sodium chloride 0.9 % 100 mL IVPB  Status:  Discontinued        1 g 200 mL/hr over 30 Minutes Intravenous Every 24 hours 12/03/19 2320 12/04/19 0207        Subjective: Patient seen and examined at bedside.  Very poor historian.  More awake.  Slightly confused.  Intermittently gets upset  and angry.  "Feels disillusioned".  No overnight fever, vomiting reported.  Does not feel well.  Objective: Vitals:   12/04/19 2250 12/05/19 0250 12/05/19 0704 12/05/19 0902  BP:  (!) 141/73 103/85 (!) 130/50  Pulse:  (!) 106 (!) 105 95  Resp:  18 20 20   Temp:  98 F (36.7 C) 98.6 F (37 C)   TempSrc:  Oral Oral   SpO2:  100% 98% 98%  Weight: 129.5 kg     Height: 6\' 1"  (1.854 m)       Intake/Output Summary (Last 24 hours) at 12/05/2019 1212 Last data filed at 12/04/2019 2142 Gross per 24 hour  Intake 104.87 ml  Output 1600 ml  Net -1495.13 ml   Filed Weights   12/03/19 1905 12/04/19 2250  Weight: 127 kg 129.5 kg    Examination:  General exam: Appears calm and comfortable.  Chronically ill.  Currently on 3 L oxygen via nasal cannula.  Very poor historian Respiratory system: Bilateral decreased breath sounds at bases with scattered crackles Cardiovascular system: S1 & S2 heard, intermittently tachycardic Gastrointestinal system: Abdomen is obese, nondistended, soft and nontender. Normal bowel sounds heard. Extremities: No cyanosis, clubbing; bilateral lower extremity edema present Central nervous system: Alert, very poor historian.  Slightly confused.  Slow to respond.  No focal neurological deficits. Moving extremities Skin: Skin is warm and dry.  Lower extremity dressing present Psychiatry: Could not be assessed because patient being a poor historian.  Intermittently gets upset and angry.    Data Reviewed: I have personally reviewed following labs and imaging studies  CBC: Recent Labs  Lab 12/03/19 1947 12/04/19 0336 12/05/19 0628  WBC 13.3* 13.4* 11.6*  NEUTROABS  --   --  9.2*  HGB 9.1* 9.0* 9.0*  HCT 35.4* 36.2* 36.1*  MCV 96.2 98.1 97.3  PLT 311 307 316   Basic Metabolic Panel: Recent Labs  Lab 12/03/19 1947 12/05/19 0628  NA 144 142  K 4.8 4.3  CL 91* 90*  CO2 41* 43*  GLUCOSE 108* 116*  BUN 24* 22  CREATININE 0.66 0.50*  CALCIUM 8.9 9.0  MG   --  1.9   GFR: Estimated Creatinine Clearance: 114.2 mL/min (A) (by C-G formula based on SCr of 0.5 mg/dL (L)). Liver Function Tests: No results for input(s): AST, ALT, ALKPHOS, BILITOT, PROT, ALBUMIN in the last 168 hours. No results for input(s): LIPASE, AMYLASE in the last 168 hours. Recent Labs  Lab 12/04/19 0336  AMMONIA 61*   Coagulation Profile: Recent Labs  Lab 12/03/19 2359  INR 1.5*   Cardiac Enzymes: No results for input(s): CKTOTAL, CKMB, CKMBINDEX, TROPONINI in the last 168 hours. BNP (last 3 results) No results for input(s): PROBNP in the last 8760 hours. HbA1C: Recent Labs    12/04/19 0000  HGBA1C 5.6   CBG: Recent Labs  Lab 12/04/19 1257 12/04/19 1738 12/04/19 2139 12/05/19 0731 12/05/19 1105  GLUCAP 113* 101* 123* 105* 167*   Lipid Profile: No results for input(s): CHOL, HDL, LDLCALC, TRIG, CHOLHDL, LDLDIRECT in the last  72 hours. Thyroid Function Tests: Recent Labs    12/04/19 0336  TSH 1.671   Anemia Panel: Recent Labs    12/04/19 0336  VITAMINB12 258   Sepsis Labs: Recent Labs  Lab 12/03/19 2348 12/04/19 0336  LATICACIDVEN 0.4* 0.6    Recent Results (from the past 240 hour(s))  Urine culture     Status: Abnormal (Preliminary result)   Collection Time: 12/03/19  9:10 PM   Specimen: In/Out Cath Urine  Result Value Ref Range Status   Specimen Description   Final    IN/OUT CATH URINE Performed at [facility], [address] [place], [place] [address]    Special Requests   Final    NONE Performed at [facility], [address], [place], [place] [address]    Culture >=100,000 COLONIES/mL GRAM NEGATIVE RODS (A)  Final   Report Status PENDING  Incomplete  SARS Coronavirus 2 by RT PCR (hospital order, performed in [facility] hospital lab) Nasopharyngeal Nasopharyngeal Swab     Status: None   Collection Time: 12/03/19 11:44 PM   Specimen: Nasopharyngeal Swab  Result Value Ref Range Status   SARS Coronavirus 2 NEGATIVE NEGATIVE  Final    Comment: (NOTE) SARS-CoV-2 target nucleic acids are NOT DETECTED.  The SARS-CoV-2 RNA is generally detectable in upper and lower respiratory specimens during the acute phase of infection. The lowest concentration of SARS-CoV-2 viral copies this assay can detect is 250 copies / mL. A negative result does not preclude SARS-CoV-2 infection and should not be used as the sole basis for treatment or other patient management decisions.  A negative result may occur with improper specimen collection / handling, submission of specimen other than nasopharyngeal swab, presence of viral mutation(s) within the areas targeted by this assay, and inadequate number of viral copies (<250 copies / mL). A negative result must be combined with clinical observations, patient history, and epidemiological information.  Fact Sheet for Patients:   BoilerBrush.com.cy  Fact Sheet for Healthcare Providers: https://pope.com/  This test is not yet approved or  cleared by the [place] FDA and has been authorized for detection and/or diagnosis of SARS-CoV-2 by FDA under an Emergency Use Authorization (EUA).  This EUA will remain in effect (meaning this test can be used) for the duration of the COVID-19 declaration under Section 564(b)(1) of the Act, 21 U.S.C. section 360bbb-3(b)(1), unless the authorization is terminated or revoked sooner.  Performed at [facility], [address] [place], [place] [address]   Blood Culture (routine x 2)     Status: None (Preliminary result)   Collection Time: 12/03/19 11:48 PM   Specimen: BLOOD LEFT ARM  Result Value Ref Range Status   Specimen Description BLOOD LEFT ARM  Final   Special Requests   Final    BOTTLES DRAWN AEROBIC AND ANAEROBIC Blood Culture adequate volume   Culture   Final    NO GROWTH 1 DAY Performed at [facility], [address] [place], [place] [address]    Report Status PENDING  Incomplete    Blood Culture (routine x 2)     Status: None (Preliminary result)   Collection Time: 12/03/19 11:59 PM   Specimen: BLOOD LEFT HAND  Result Value Ref Range Status   Specimen Description BLOOD LEFT HAND  Final   Special Requests   Final    BOTTLES DRAWN AEROBIC AND ANAEROBIC Blood Culture adequate volume   Culture   Final    NO GROWTH 1 DAY Performed at [facility], [address] [place], [place] [address]    Report Status PENDING  Incomplete         Radiology Studies: CT Head Wo Contrast  Result Date: 12/03/2019 CLINICAL DATA:  Mental status changes EXAM: CT HEAD WITHOUT CONTRAST TECHNIQUE: Contiguous axial images were obtained from the base of the skull through the vertex without intravenous contrast. COMPARISON:  10/19/2019 FINDINGS: Brain: There is atrophy and chronic small vessel disease changes. No acute intracranial abnormality. Specifically, no hemorrhage, hydrocephalus, mass lesion, acute infarction, or significant intracranial injury. Vascular: No hyperdense vessel or unexpected calcification. Skull: No acute calvarial abnormality. Sinuses/Orbits: Visualized paranasal sinuses and mastoids clear. Orbital soft tissues unremarkable. Other: None IMPRESSION: Atrophy, chronic microvascular disease. No acute intracranial abnormality. Electronically Signed   By: Charlett Nose M.D.   On: 12/03/2019 22:13   DG Chest Portable 1 View  Result Date: 12/03/2019 CLINICAL DATA:  Shortness of breath EXAM: PORTABLE CHEST 1 VIEW COMPARISON:  10/20/2019 FINDINGS: Small bilateral pleural effusions. Cardiomegaly with central vascular congestion. No pneumothorax. Aortic atherosclerosis. IMPRESSION: Cardiomegaly with central vascular congestion and small bilateral pleural effusions. Electronically Signed   By: Jasmine Pang M.D.   On: 12/03/2019 19:48        Scheduled Meds:  apixaban  5 mg Oral BID   feeding supplement (ENSURE ENLIVE)  237 mL Oral BID BM   insulin aspart  0-5 Units Subcutaneous  QHS   insulin aspart  0-9 Units Subcutaneous TID WC   metoprolol tartrate  12.5 mg Oral BID   Continuous Infusions:  ceFEPime (MAXIPIME) IV 2 g (12/05/19 7829)          Glade Lloyd, MD [facility] 12/05/2019, 12:12 PM

## 2019-12-05 NOTE — Plan of Care (Signed)
  Problem: Acute Rehab PT Goals(only PT should resolve) Goal: Pt will Roll Supine to Side Outcome: Progressing Flowsheets (Taken 12/05/2019 1515) Pt will Roll Supine to Side: with mod assist Goal: Pt Will Go Supine/Side To Sit Outcome: Progressing Flowsheets (Taken 12/05/2019 1515) Pt will go Supine/Side to Sit:  with moderate assist  with maximum assist Goal: Pt Will Go Sit To Supine/Side Outcome: Progressing Flowsheets (Taken 12/05/2019 1515) Pt will go Sit to Supine/Side: with moderate assist Goal: Patient Will Perform Sitting Balance Outcome: Progressing Flowsheets (Taken 12/05/2019 1515) Patient will perform sitting balance:  with minimal assist  with moderate assist   3:15 PM, 12/05/19 Ocie Bob, MPT Physical Therapist with [facility] [phone] office [phone] mobile phone

## 2019-12-05 NOTE — Care Management Important Message (Signed)
Important Message  Patient Details  Name: Jonathan Wise MRN: [id number] Date of Birth: Jun 08, 1945   Medicare Important Message Given:  Yes     Corey Harold 12/05/2019, 4:05 PM

## 2019-12-05 NOTE — Evaluation (Signed)
Physical Therapy Evaluation Patient Details Name: Jonathan Wise MRN: [id number] DOB: 1945/08/04 Today's Date: 12/05/2019   History of Present Illness  Jonathan Wise is a 74 y.o. male with medical history significant of paroxysmal A. fib on Eliquis, chronic diastolic CHF, chronic respiratory failure on 3 L home oxygen, DVT, hypertension, hyperlipidemia, obesity (BMI 36.94), sleep apnea, insulin-dependent type 2 diabetes, venous stasis ulcers presenting to the ED via EMS for evaluation of shortness of breath. Home health nurse called EMS for evaluation of bilateral hand tremors and slurred speech that has been ongoing for 1 week. It is reported that patient has been refusing to take his Lasix at home and has bilateral lower extremity edema. He is chronically on 3 L home oxygen. EMS noted oxygen saturation of 88% on 3 L home oxygen. Patient was admitted to the hospital last month for septic shock secondary to UTI/right lower lobe pneumonia.    Clinical Impression  Patient required much time to become alert to participate with therapy, demonstrates slow labored movement for sitting up at bedside, unable to maintain sitting balance with frequent falling backwards and required total assist to reposition when put back to bed.  Patient will benefit from continued physical therapy in hospital and recommended venue below to increase strength, balance, endurance for safe ADLs and gait.    Follow Up Recommendations SNF;Supervision for mobility/OOB;Supervision - Intermittent    Equipment Recommendations  None recommended by PT    Recommendations for Other Services       Precautions / Restrictions Precautions Precautions: Fall Restrictions Weight Bearing Restrictions: No      Mobility  Bed Mobility Overal bed mobility: Needs Assistance Bed Mobility: Supine to Sit;Sit to Supine     Supine to sit: Max assist;Total assist Sit to supine: Max assist;Total assist   General bed mobility comments:  slow labored movement  Transfers                    Ambulation/Gait                Stairs            Wheelchair Mobility    Modified Rankin (Stroke Patients Only)       Balance Overall balance assessment: Needs assistance Sitting-balance support: Feet unsupported;Bilateral upper extremity supported Sitting balance-Leahy Scale: Poor Sitting balance - Comments: seated at EOB                                     Pertinent Vitals/Pain Pain Assessment: No/denies pain    Home Living Family/patient expects to be discharged to:: Private residence Living Arrangements: Spouse/significant other Available Help at Discharge: Family;Available 24 hours/day Type of Home: House Home Access: Ramped entrance     Home Layout: One level Home Equipment: Walker - 2 wheels;Cane - single point;Bedside commode;Wheelchair - manual Additional Comments: has Nurse, adult    Prior Function Level of Independence: Needs assistance      ADL's / Homemaking Assistance Needed: assisted by family and home aides        Hand Dominance   Dominant Hand: Right    Extremity/Trunk Assessment   Upper Extremity Assessment Upper Extremity Assessment: Generalized weakness    Lower Extremity Assessment Lower Extremity Assessment: Generalized weakness    Cervical / Trunk Assessment Cervical / Trunk Assessment: Kyphotic  Communication   Communication: Other (comment) (Patient slightly confused)  Cognition Arousal/Alertness: Lethargic Behavior During  Therapy: Flat affect Overall Cognitive Status: Impaired/Different from baseline Area of Impairment: Orientation;Following commands                 Orientation Level: Person     Following Commands: Follows one step commands with increased time;Follows multi-step commands inconsistently       General Comments: presents lethargic      General Comments      Exercises     Assessment/Plan    PT  Assessment Patient needs continued PT services  PT Problem List Decreased strength;Decreased activity tolerance;Decreased balance;Decreased mobility       PT Treatment Interventions Functional mobility training;Therapeutic activities;Therapeutic exercise;Patient/family education;Balance training    PT Goals (Current goals can be found in the Care Plan section)  Acute Rehab PT Goals Patient Stated Goal: return home with family and home aides to assist PT Goal Formulation: With patient/family Time For Goal Achievement: 12/19/19 Potential to Achieve Goals: Fair    Frequency Min 3X/week   Barriers to discharge        Co-evaluation               AM-PAC PT "6 Clicks" Mobility  Outcome Measure Help needed turning from your back to your side while in a flat bed without using bedrails?: A Lot Help needed moving from lying on your back to sitting on the side of a flat bed without using bedrails?: A Lot Help needed moving to and from a bed to a chair (including a wheelchair)?: Total Help needed standing up from a chair using your arms (e.g., wheelchair or bedside chair)?: Total Help needed to walk in hospital room?: Total Help needed climbing 3-5 steps with a railing? : Total 6 Click Score: 8    End of Session   Activity Tolerance: Patient limited by fatigue;Patient limited by lethargy Patient left: in bed;with call bell/phone within reach Nurse Communication: Mobility status PT Visit Diagnosis: Unsteadiness on feet (R26.81);Other abnormalities of gait and mobility (R26.89);Muscle weakness (generalized) (M62.81)    Time: 6433-2951 PT Time Calculation (min) (ACUTE ONLY): 28 min   Charges:   PT Evaluation $PT Eval Moderate Complexity: 1 Mod PT Treatments $Therapeutic Activity: 23-37 mins        3:13 PM, 12/05/19 Ocie Bob, MPT Physical Therapist with [facility] [phone] office [phone] mobile phone

## 2019-12-05 NOTE — NC FL2 (Signed)
[place] MEDICAID FL2 LEVEL OF CARE SCREENING TOOL     IDENTIFICATION  Patient Name: Jonathan Wise Birthdate: 1946-03-23 Sex: male Admission Date (Current Location): 12/03/2019  [facility] and [place] Number:  [employer] and Address:  [facility],  [address] [place] [address]      Provider Number: [id number]  Attending Physician Name and Address:  Glade Lloyd, MD  Relative Name and Phone Number:       Current Level of Care: Hospital Recommended Level of Care: Skilled Nursing Facility Prior Approval Number:    Date Approved/Denied:   PASRR Number: [id number]  Discharge Plan: SNF    Current Diagnoses: Patient Active Problem List   Diagnosis Date Noted  . UTI (urinary tract infection) 12/04/2019  . Acute on chronic diastolic CHF (congestive heart failure) (HCC) 12/04/2019  . Elevated troponin 12/04/2019  . Goals of care, counseling/discussion   . Palliative care by specialist   . Severe sepsis with septic shock (HCC) 10/24/2019  . Sepsis secondary to UTI (HCC) 10/24/2019  . Lobar pneumonia (HCC) 10/24/2019  . DVT (deep venous thrombosis) (HCC) 10/24/2019  . Severe sepsis (HCC) 10/19/2019  . Sepsis (HCC) 10/19/2019  . Pressure injury of skin 06/19/2019  . 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 Bedbound 05/14/2019  . Former heavy tobacco smoker 05/14/2019  . Atrial fibrillation with RVR (HCC) 05/13/2019  . Chronic osteoarthritis 01/01/2017  . Lower extremity weakness 01/01/2017  . Acute respiratory failure with hypoxia (HCC) 12/28/2016  . Anemia of chronic disease 12/25/2015  . Candidal intertrigo 12/22/2015  . 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 Bradycardia 03/15/2014  . Lactic acidosis 03/15/2014  . Atrial fibrillation (HCC) 06/23/2012  . PVC's (premature ventricular contractions) 06/23/2012  . Cellulitis of leg, left 06/22/2012  . Sepsis due to pneumonia (HCC) 06/22/2012  . Morbid obesity (HCC)  06/22/2012  . Osteoarthritis of right knee 06/22/2012  . Current use of long term anticoagulation 06/22/2012  . Pulmonary artery hypertension (HCC) 10/18/2011  . Bilateral lower extremity edema 10/14/2011  . Hypotension 10/14/2011  . Venous stasis ulcers (HCC) 10/14/2011  . Type 2 diabetes mellitus without complication (HCC) 10/14/2011    Orientation RESPIRATION BLADDER Height & Weight     Self, Time, Situation, Place  O2 (3 L) External catheter Weight: 285 lb 7.9 oz (129.5 kg) Height:  6\' 1"  (185.4 cm)  BEHAVIORAL SYMPTOMS/MOOD NEUROLOGICAL BOWEL NUTRITION STATUS      Incontinent Diet (heart healthy/carb modified. See d/c summary for updates.)  AMBULATORY STATUS COMMUNICATION OF NEEDS Skin   Total Care Verbally Other (Comment) (Several venous stasis ulcers left tibia with dressings.)                       Personal Care Assistance Level of Assistance  Bathing, Feeding, Dressing Bathing Assistance: Maximum assistance Feeding assistance: Limited assistance Dressing Assistance: Maximum assistance     Functional Limitations Info  Sight, Hearing, Speech Sight Info: Impaired Hearing Info: Adequate Speech Info: Adequate    SPECIAL CARE FACTORS FREQUENCY  PT (By licensed PT)     PT Frequency: daily              Contractures      Additional Factors Info  Code Status, Allergies Code Status Info: DNR Allergies Info: No known allergies.           Current Medications (12/05/2019):  This is the current hospital active medication list Current Facility-Administered Medications  Medication Dose Route  Frequency Provider Last Rate Last Admin  . acetaminophen (TYLENOL) tablet 650 mg  650 mg Oral Q6H PRN John Giovanni, MD   650 mg at 12/04/19 1694   Or  . acetaminophen (TYLENOL) suppository 650 mg  650 mg Rectal Q6H PRN John Giovanni, MD      . apixaban (ELIQUIS) tablet 5 mg  5 mg Oral BID Glade Lloyd, MD   5 mg at 12/05/19 1528  . ceFEPIme (MAXIPIME) 2 g in  sodium chloride 0.9 % 100 mL IVPB  2 g Intravenous Q8H Titus Mould, RPH 200 mL/hr at 12/05/19 1528 2 g at 12/05/19 1528  . feeding supplement (ENSURE ENLIVE) (ENSURE ENLIVE) liquid 237 mL  237 mL Oral BID BM John Giovanni, MD   237 mL at 12/05/19 1531  . furosemide (LASIX) injection 40 mg  40 mg Intravenous Q12H Glade Lloyd, MD   40 mg at 12/05/19 1528  . insulin aspart (novoLOG) injection 0-5 Units  0-5 Units Subcutaneous QHS John Giovanni, MD      . insulin aspart (novoLOG) injection 0-9 Units  0-9 Units Subcutaneous TID WC John Giovanni, MD   2 Units at 12/05/19 1224  . lactulose (CHRONULAC) 10 GM/15ML solution 10 g  10 g Oral BID Glade Lloyd, MD   10 g at 12/05/19 1538  . metoprolol tartrate (LOPRESSOR) injection 5 mg  5 mg Intravenous Q4H PRN John Giovanni, MD      . metoprolol tartrate (LOPRESSOR) tablet 12.5 mg  12.5 mg Oral BID Glade Lloyd, MD   12.5 mg at 12/05/19 0902     Discharge Medications: Please see discharge summary for a list of discharge medications.  Relevant Imaging Results:  Relevant Lab Results:   Additional Information SSN: [id number]. Pt has not had COVID vaccines.  Karn Cassis, LCSW

## 2019-12-06 DIAGNOSIS — G9341 Metabolic encephalopathy: Secondary | ICD-10-CM

## 2019-12-06 LAB — COMPREHENSIVE METABOLIC PANEL
ALT: 9 U/L (ref 0–44)
AST: 15 U/L (ref 15–41)
Albumin: 2.8 g/dL — ABNORMAL LOW (ref 3.5–5.0)
Alkaline Phosphatase: 49 U/L (ref 38–126)
Anion gap: 11 (ref 5–15)
BUN: 23 mg/dL (ref 8–23)
CO2: 42 mmol/L — ABNORMAL HIGH (ref 22–32)
Calcium: 8.8 mg/dL — ABNORMAL LOW (ref 8.9–10.3)
Chloride: 87 mmol/L — ABNORMAL LOW (ref 98–111)
Creatinine, Ser: 0.54 mg/dL — ABNORMAL LOW (ref 0.61–1.24)
GFR calc Af Amer: >60 mL/min (ref >60)
GFR calc non Af Amer: >60 mL/min (ref >60)
Glucose, Bld: 96 mg/dL (ref 70–99)
Potassium: 4 mmol/L (ref 3.5–5.1)
Sodium: 140 mmol/L (ref 135–145)
Total Bilirubin: 0.7 mg/dL (ref 0.3–1.2)
Total Protein: 6.5 g/dL (ref 6.5–8.1)

## 2019-12-06 LAB — CBC WITH DIFFERENTIAL/PLATELET
Abs Immature Granulocytes: 0.05 10*3/uL (ref 0.00–0.07)
Basophils Absolute: 0.1 10*3/uL (ref 0.0–0.1)
Basophils Relative: 1 %
Eosinophils Absolute: 0.4 10*3/uL (ref 0.0–0.5)
Eosinophils Relative: 4 %
HCT: 32.7 % — ABNORMAL LOW (ref 39.0–52.0)
Hemoglobin: 8.6 g/dL — ABNORMAL LOW (ref 13.0–17.0)
Immature Granulocytes: 1 %
Lymphocytes Relative: 12 %
Lymphs Abs: 1.3 10*3/uL (ref 0.7–4.0)
MCH: 24.7 pg — ABNORMAL LOW (ref 26.0–34.0)
MCHC: 26.3 g/dL — ABNORMAL LOW (ref 30.0–36.0)
MCV: 94 fL (ref 80.0–100.0)
Monocytes Absolute: 1.2 10*3/uL — ABNORMAL HIGH (ref 0.1–1.0)
Monocytes Relative: 11 %
Neutro Abs: 7.5 10*3/uL (ref 1.7–7.7)
Neutrophils Relative %: 71 %
Platelets: 298 10*3/uL (ref 150–400)
RBC: 3.48 MIL/uL — ABNORMAL LOW (ref 4.22–5.81)
RDW: 15.7 % — ABNORMAL HIGH (ref 11.5–15.5)
WBC: 10.6 10*3/uL — ABNORMAL HIGH (ref 4.0–10.5)
nRBC: 0 % (ref 0.0–0.2)

## 2019-12-06 LAB — GLUCOSE, CAPILLARY
Glucose-Capillary: 101 mg/dL — ABNORMAL HIGH (ref 70–99)
Glucose-Capillary: 134 mg/dL — ABNORMAL HIGH (ref 70–99)
Glucose-Capillary: 122 mg/dL — ABNORMAL HIGH (ref 70–99)
Glucose-Capillary: 127 mg/dL — ABNORMAL HIGH (ref 70–99)

## 2019-12-06 LAB — URINE CULTURE: Culture: >=100000 — AB

## 2019-12-06 LAB — MAGNESIUM: Magnesium: 1.9 mg/dL (ref 1.7–2.4)

## 2019-12-06 LAB — AMMONIA: Ammonia: 42 umol/L — ABNORMAL HIGH (ref 9–35)

## 2019-12-06 NOTE — TOC Progression Note (Addendum)
Transition of Care [facility]) - Progression Note    Patient Details  Name: Jonathan Wise MRN: [id number] Date of Birth: 06/01/1945  Transition of Care [facility]) CM/SW Contact  Karn Cass[place], [place] Phone Number: 12/06/2019, 9:43 AM  Clinical Narrative: Pt accepts bed offer at [facility]. Melissa at facility notified and due to waiver, will start authorization when pt arrives at facility. Pt will require repeat COVID test prior to d/c.     Expected Discharge Plan: Home w Home Health Services Barriers to Discharge: Continued Medical Work up  Expected Discharge Plan and Services Expected Discharge Plan: Home w Home Health Services In-house Referral: Clinical Social Work, Hospice / Palliative Care Discharge Planning Services: NA Post Acute Care Choice: Home Health Living arrangements for the past 2 months: Single Family Home                 DME Arranged: N/A DME Agency: NA       HH Arranged: PT, RN, Nurse's Aide HH Agency: [facility] Date HH Agency Contacted: 12/04/19 Time HH Agency Contacted: [phone] Representative spoke with at [facility] Agency: Bonita Quin   Social Determinants of Health (SDOH) Interventions    Readmission Risk Interventions Readmission Risk Prevention Plan 12/04/2019 10/30/2019  Transportation Screening Complete Complete  PCP or Specialist Appt within 5-7 Days - Not Complete  Not Complete comments - disposition pending  Home Care Screening - Complete  Medication Review (RN CM) - Referral to Pharmacy  HRI or Home Care Consult Complete -  Social Work Consult for Recovery Care Planning/Counseling Complete -  Palliative Care Screening Not Complete -  Palliative Care Screening Not Complete Comments Palliative consulted; has not seen him yet -  Medication Review Oceanographer) Complete -  Some recent data might be hidden

## 2019-12-06 NOTE — Progress Notes (Signed)
Patient ID: Jonathan Wise, male   DOB: 31-Mar-1946, 74 y.o.   MRN: [id number]  PROGRESS NOTE    Jonathan Wise  EGB:[id number] DOB: [birth year] DOA: 12/03/2019 PCP: [name], MD   Brief Narrative:  74 y.o. male with medical history significant of paroxysmal A. fib on Eliquis, chronic diastolic CHF, chronic respiratory failure on 3 L home oxygen, DVT, hypertension, hyperlipidemia, obesity, sleep apnea, insulin-dependent type 2 diabetes, venous stasis ulcers presented with worsening shortness of breath along with hand tremors and some slurring of speech.  On resolution, patient was tachycardic and tachypneic with a white count of 13.3.  UA was suggestive of UTI.  COVID-19 testing was negative.  Chest x-ray showed cardiomegaly with central vascular congestion and small bilateral pleural effusions.  CT of the head was negative for acute intracranial abnormality.  Patient was treated with IV fluids and antibiotics.  Assessment & Plan:   Sepsis secondary to UTI: Present on admission UTI -Presented with tachycardia, tachypnea, leukocytosis with urinalysis suggestive of UTI along with altered mental status -Treated with IV fluids in the ED and currently on cefepime.  Urine cultures growing Proteus mirabilis.  Follow sensitivities.  Off IV fluids. -Afebrile and hemodynamically improving.  Acute on chronic diastolic CHF -Apparently patient has been refusing Lasix at home.  Chest x-ray showed cardiomegaly with central vascular congestion and small bilateral pleural effusions on presentation.  Echo on 10/20/2019 had shown normal systolic function with EF of 55 to 60% with indeterminate diastolic parameters -Strict input and output.  Daily weights. -Continue Lasix 40 mg IV every 12 hours.  Fluid restriction.  Paroxysmal A. fib with RVR -Currently rate controlled.  Continue metoprolol and Eliquis.  Acute metabolic encephalopathy -Likely due to sepsis/UTI.  Head CT was negative for acute  intracranial abnormality. -B12 258.  TSH 1.671.  Ammonia was 61 on presentation.  Continue lactulose.  Monitor mental status which is improving.  If mental status does not improve, will consider MRI of the brain -Continue outpatient neurology evaluation and follow-up  Leukocytosis -Resolved  Anemia of chronic disease -Questionable cause.  Hemoglobin stable.  No signs of bleeding  Chronic hypoxic respiratory failure -Respiratory status stable.  Currently requiring 3 L oxygen via nasal cannula.  Diabetes mellitus type 2 -A1c 5.6.  Continue CBGs with SSI  Generalized deconditioning -Overall prognosis is guarded to poor.  PT recommends SNF placement.  Social worker consulted.  Palliative care evaluation appreciated.  Patient is a DNR.  If condition worsens, consider hospice/comfort measures  Bilateral lower extremity chronic venous stasis ulcers: Present on admission -Wound care as per wound care consult recommendations    DVT prophylaxis: Eliquis Code Status: DNR Family Communication: called wife/[name] on phone on 12/06/19  Disposition Plan: Status is: Inpatient  Remains inpatient appropriate because:Inpatient level of care appropriate due to severity of illness   Dispo: The patient is from: Home              Anticipated d/c is to: SNF              Anticipated d/c date is: 2 days              Patient currently is not medically stable to d/c.   Consultants: Palliative care  Procedures: none  Antimicrobials:  Anti-infectives (From admission, onward)   Start     Dose/Rate Route Frequency Ordered Stop   12/04/19 0300  ceFEPIme (MAXIPIME) 2 g in sodium chloride 0.9 % 100 mL IVPB  2 g 200 mL/hr over 30 Minutes Intravenous Every 8 hours 12/04/19 0207     12/03/19 2330  cefTRIAXone (ROCEPHIN) 1 g in sodium chloride 0.9 % 100 mL IVPB  Status:  Discontinued        1 g 200 mL/hr over 30 Minutes Intravenous Every 24 hours 12/03/19 2320 12/04/19 0207        Subjective: Patient seen and examined at bedside.  Poor historian.  States "feel less disillusioned".  Still feels weak.  Denies worsening shortness of breath, fever, vomiting. Objective: Vitals:   12/05/19 0250 12/05/19 0704 12/05/19 0902 12/05/19 2125  BP: (!) 141/73 103/85 (!) 130/50 (!) 94/47  Pulse: (!) 106 (!) 105 95 61  Resp: 18 20 20 19   Temp: 98 F (36.7 C) 98.6 F (37 C)  98.2 F (36.8 C)  TempSrc: Oral Oral  Oral  SpO2: 100% 98% 98% 99%  Weight:      Height:        Intake/Output Summary (Last 24 hours) at 12/06/2019 0836 Last data filed at 12/05/2019 1700 Gross per 24 hour  Intake 240 ml  Output 700 ml  Net -460 ml   Filed Weights   12/03/19 1905 12/04/19 2250  Weight: 127 kg 129.5 kg    Examination:  General exam: No acute distress.  Chronically ill.  Currently on 3 L oxygen via nasal cannula.  Poor historian. Respiratory system: Bilateral decreased breath sounds at bases with some crackles.  No wheezing Cardiovascular system: S1 & S2 heard, mildly tachycardic intermittently Gastrointestinal system: Abdomen is obese, nondistended, soft and nontender.  Bowel sounds are heard  extremities: Bilateral lower extremity edema present.  No clubbing Central nervous system: Awake but very poor historian.  Still slightly confused to time.  Slow to respond.  No focal neurological deficits. Moving extremities Skin: No ecchymosis.  Lower extremity dressing present Psychiatry: Cannot be assessed because of mental status   Data Reviewed: I have personally reviewed following labs and imaging studies  CBC: Recent Labs  Lab 12/03/19 1947 12/04/19 0336 12/05/19 0628 12/06/19 0638  WBC 13.3* 13.4* 11.6* 10.6*  NEUTROABS  --   --  9.2* 7.5  HGB 9.1* 9.0* 9.0* 8.6*  HCT 35.4* 36.2* 36.1* 32.7*  MCV 96.2 98.1 97.3 94.0  PLT 311 307 316 298   Basic Metabolic Panel: Recent Labs  Lab 12/03/19 1947 12/05/19 0628 12/06/19 0638  NA 144 142 140  K 4.8 4.3 4.0  CL  91* 90* 87*  CO2 41* 43* 42*  GLUCOSE 108* 116* 96  BUN 24* 22 23  CREATININE 0.66 0.50* 0.54*  CALCIUM 8.9 9.0 8.8*  MG  --  1.9 1.9   GFR: Estimated Creatinine Clearance: 114.2 mL/min (A) (by C-G formula based on SCr of 0.54 mg/dL (L)). Liver Function Tests: Recent Labs  Lab 12/06/19 0638  AST 15  ALT 9  ALKPHOS 49  BILITOT 0.7  PROT 6.5  ALBUMIN 2.8*   No results for input(s): LIPASE, AMYLASE in the last 168 hours. Recent Labs  Lab 12/04/19 0336 12/06/19 0638  AMMONIA 61* 42*   Coagulation Profile: Recent Labs  Lab 12/03/19 2359  INR 1.5*   Cardiac Enzymes: No results for input(s): CKTOTAL, CKMB, CKMBINDEX, TROPONINI in the last 168 hours. BNP (last 3 results) No results for input(s): PROBNP in the last 8760 hours. HbA1C: Recent Labs    12/04/19 0000  HGBA1C 5.6   CBG: Recent Labs  Lab 12/05/19 0731 12/05/19 1105 12/05/19 1635 12/05/19 2218 12/06/19  0737  GLUCAP 105* 167* 154* 92 101*   Lipid Profile: No results for input(s): CHOL, HDL, LDLCALC, TRIG, CHOLHDL, LDLDIRECT in the last 72 hours. Thyroid Function Tests: Recent Labs    12/04/19 0336  TSH 1.671   Anemia Panel: Recent Labs    12/04/19 0336  VITAMINB12 258   Sepsis Labs: Recent Labs  Lab 12/03/19 2348 12/04/19 0336  LATICACIDVEN 0.4* 0.6    Recent Results (from the past 240 hour(s))  Urine culture     Status: Abnormal (Preliminary result)   Collection Time: 12/03/19  9:10 PM   Specimen: In/Out Cath Urine  Result Value Ref Range Status   Specimen Description   Final    IN/OUT CATH URINE Performed at [facility], [address] [place], [place] [address]    Special Requests   Final    NONE Performed at [facility], [address] [place], [place] [address]    Culture >=100,000 COLONIES/mL PROTEUS MIRABILIS (A)  Final   Report Status PENDING  Incomplete  SARS Coronavirus 2 by RT PCR (hospital order, [facility]) Nasopharyngeal Nasopharyngeal  Swab     Status: None   Collection Time: 12/03/19 11:44 PM   Specimen: Nasopharyngeal Swab  Result Value Ref Range Status   SARS Coronavirus 2 NEGATIVE NEGATIVE Final    Comment: (NOTE) SARS-CoV-2 target nucleic acids are NOT DETECTED.  The SARS-CoV-2 RNA is generally detectable in upper and lower respiratory specimens during the acute phase of infection. The lowest concentration of SARS-CoV-2 viral copies this assay can detect is 250 copies / mL. A negative result does not preclude SARS-CoV-2 infection and should not be used as the sole basis for treatment or other patient management decisions.  A negative result may occur with improper specimen collection / handling, submission of specimen other than nasopharyngeal swab, presence of viral mutation(s) within the areas targeted by this assay, and inadequate number of viral copies (<250 copies / mL). A negative result must be combined with clinical observations, patient history, and epidemiological information.  Fact Sheet for Patients:   BoilerBrush.com.cyhttps://www.fda.gov/media/136312/download  Fact Sheet for Healthcare Providers: https://pope.com/https://www.fda.gov/media/136313/download  This test is not yet approved or  cleared by the [place] FDA and has been authorized for detection and/or diagnosis of SARS-CoV-2 by FDA under an Emergency Use Authorization (EUA).  This EUA will remain in effect (meaning this test can be used) for the duration of the COVID-19 declaration under Section 564(b)(1) of the Act, 21 U.S.C. section 360bbb-3(b)(1), unless the authorization is terminated or revoked sooner.  Performed at [facility], [address] [place], [place] [address]   Blood Culture (routine x 2)     Status: None (Preliminary result)   Collection Time: 12/03/19 11:48 PM   Specimen: BLOOD LEFT ARM  Result Value Ref Range Status   Specimen Description BLOOD LEFT ARM  Final   Special Requests   Final    BOTTLES DRAWN AEROBIC AND ANAEROBIC Blood Culture  adequate volume   Culture   Final    NO GROWTH 1 DAY Performed at [facility], [address] [place], [place] [address]    Report Status PENDING  Incomplete  Blood Culture (routine x 2)     Status: None (Preliminary result)   Collection Time: 12/03/19 11:59 PM   Specimen: BLOOD LEFT HAND  Result Value Ref Range Status   Specimen Description BLOOD LEFT HAND  Final   Special Requests   Final    BOTTLES DRAWN AEROBIC  AND ANAEROBIC Blood Culture adequate volume   Culture   Final    NO GROWTH 1 DAY Performed at [facility], [address] [place], [place] [address]    Report Status PENDING  Incomplete         Radiology Studies: No results found.      Scheduled Meds: . (feeding supplement) PROSource Plus  30 mL Oral BID BM  . apixaban  5 mg Oral BID  . feeding supplement (ENSURE ENLIVE)  237 mL Oral BID BM  . furosemide  40 mg Intravenous Q12H  . insulin aspart  0-5 Units Subcutaneous QHS  . insulin aspart  0-9 Units Subcutaneous TID WC  . lactulose  10 g Oral BID  . metoprolol tartrate  12.5 mg Oral BID  . multivitamin-lutein  1 capsule Oral Daily   Continuous Infusions: . ceFEPime (MAXIPIME) IV 2 g (12/06/19 0737)          Glade Lloyd, MD [facility] 12/06/2019, 8:36 AM

## 2019-12-07 DIAGNOSIS — R778 Other specified abnormalities of plasma proteins: Secondary | ICD-10-CM

## 2019-12-07 LAB — CBC WITH DIFFERENTIAL/PLATELET
Abs Immature Granulocytes: 0.05 10*3/uL (ref 0.00–0.07)
Basophils Absolute: 0.1 10*3/uL (ref 0.0–0.1)
Basophils Relative: 1 %
Eosinophils Absolute: 0.4 10*3/uL (ref 0.0–0.5)
Eosinophils Relative: 4 %
HCT: 33.3 % — ABNORMAL LOW (ref 39.0–52.0)
Hemoglobin: 8.6 g/dL — ABNORMAL LOW (ref 13.0–17.0)
Immature Granulocytes: 1 %
Lymphocytes Relative: 10 %
Lymphs Abs: 1 10*3/uL (ref 0.7–4.0)
MCH: 24.5 pg — ABNORMAL LOW (ref 26.0–34.0)
MCHC: 25.8 g/dL — ABNORMAL LOW (ref 30.0–36.0)
MCV: 94.9 fL (ref 80.0–100.0)
Monocytes Absolute: 1 10*3/uL (ref 0.1–1.0)
Monocytes Relative: 10 %
Neutro Abs: 6.8 10*3/uL (ref 1.7–7.7)
Neutrophils Relative %: 74 %
Platelets: 301 10*3/uL (ref 150–400)
RBC: 3.51 MIL/uL — ABNORMAL LOW (ref 4.22–5.81)
RDW: 15.7 % — ABNORMAL HIGH (ref 11.5–15.5)
WBC: 9.2 10*3/uL (ref 4.0–10.5)
nRBC: 0 % (ref 0.0–0.2)

## 2019-12-07 LAB — BASIC METABOLIC PANEL
Anion gap: 12 (ref 5–15)
BUN: 30 mg/dL — ABNORMAL HIGH (ref 8–23)
CO2: 42 mmol/L — ABNORMAL HIGH (ref 22–32)
Calcium: 8.9 mg/dL (ref 8.9–10.3)
Chloride: 87 mmol/L — ABNORMAL LOW (ref 98–111)
Creatinine, Ser: 0.64 mg/dL (ref 0.61–1.24)
GFR calc Af Amer: >60 mL/min (ref >60)
GFR calc non Af Amer: >60 mL/min (ref >60)
Glucose, Bld: 100 mg/dL — ABNORMAL HIGH (ref 70–99)
Potassium: 4.5 mmol/L (ref 3.5–5.1)
Sodium: 141 mmol/L (ref 135–145)

## 2019-12-07 LAB — GLUCOSE, CAPILLARY
Glucose-Capillary: 105 mg/dL — ABNORMAL HIGH (ref 70–99)
Glucose-Capillary: 127 mg/dL — ABNORMAL HIGH (ref 70–99)

## 2019-12-07 LAB — SARS CORONAVIRUS 2 BY RT PCR (HOSPITAL ORDER, PERFORMED IN ~~LOC~~ HOSPITAL LAB): SARS Coronavirus 2: NEGATIVE

## 2019-12-07 LAB — MAGNESIUM: Magnesium: 1.9 mg/dL (ref 1.7–2.4)

## 2019-12-07 MED ORDER — CEPHALEXIN 500 MG PO CAPS: 500.0000 mg | ORAL_CAPSULE | Freq: Three times a day (TID) | ORAL | 0 refills | Status: AC

## 2019-12-07 MED ORDER — LACTULOSE 10 GM/15ML PO SOLN: 10.0000 g | Freq: Two times a day (BID) | ORAL | 0 refills | Status: DC

## 2019-12-07 MED ORDER — FUROSEMIDE 40 MG PO TABS: 40.0000 mg | ORAL_TABLET | Freq: Every day | ORAL | 0 refills | Status: DC

## 2019-12-07 NOTE — Discharge Summary (Addendum)
Physician Discharge Summary  MIHCAEL LEDEE YSA:[id number] DOB: October 30, 1945 DOA: 12/03/2019  PCP: [name], MD  Admit date: 12/03/2019 Discharge date: 12/07/2019  Admitted From: Home Disposition: SNF  Recommendations for Outpatient Follow-up:  1. Follow up with SNF provider at earliest convenience  2. Outpatient follow-up with cardiology/Dr. Jacinto Halim  3. Outpatient follow-up with palliative care for goals of care discussion continuation  4. Outpatient evaluation and follow-up by neurology  5. follow up in ED if symptoms worsen or new appear   Home Health: No Equipment/Devices: Continue supplemental oxygen via nasal cannula at 3 L/min  Discharge Condition: Guarded CODE STATUS: DNR Diet recommendation: Heart healthy/fluid restriction of up to 1500 cc a day  Brief/Interim Summary: 74 y.o.malewith medical history significant ofparoxysmal A. fib on Eliquis, chronic diastolic CHF, chronic respiratory failure on 3 L home oxygen, DVT, hypertension, hyperlipidemia, obesity, sleep apnea, insulin-dependent type 2 diabetes, venous stasis ulcers presented with worsening shortness of breath along with hand tremors and some slurring of speech.  On resolution, patient was tachycardic and tachypneic with a white count of 13.3.  UA was suggestive of UTI.  COVID-19 testing was negative.  Chest x-ray showed cardiomegaly with central vascular congestion and small bilateral pleural effusions.  CT of the head was negative for acute intracranial abnormality.  Patient was treated with IV fluids and antibiotics.  During the hospitalization, mental status gradually improved.  Urine cultures growing Proteus.  He is currently on cefepime; will switch to oral Keflex for 3 more days on discharge today.  He will be discharged to SNF.  Will benefit from outpatient neurology evaluation and follow-up and continued palliative care evaluation and follow-up.  Discharge Diagnoses:   Severe sepsis secondary to UTI: Present  on admission UTI -Presented with tachycardia, tachypnea, leukocytosis with urinalysis suggestive of UTI along with altered mental status -Treated with IV fluids in the ED and currently on cefepime.  Urine cultures growing Proteus mirabilis.  Off IV fluids. -Afebrile and hemodynamically improving. -Sepsis has resolved -Switch to oral Keflex for 3 more days on discharge today.  Acute on chronic diastolic CHF -Apparently patient has been refusing Lasix at home.  Chest x-ray showed cardiomegaly with central vascular congestion and small bilateral pleural effusions on presentation.  Echo on 10/20/2019 had shown normal systolic function with EF of 55 to 60% with indeterminate diastolic parameters - will need compliance with diet and fluid restriction of up to 1500 cc a day -Currently on Lasix twice a day IV.  Switch to Lasix 40 mg once a day on discharge.  Outpatient follow-up with cardiology/Dr. Jacinto Halim  Paroxysmal A. fib with RVR -Currently rate controlled.  Continue metoprolol, digoxin and Eliquis.  Outpatient follow-up with cardiology  Acute metabolic encephalopathy -Likely due to sepsis/UTI.  Head CT was negative for acute intracranial abnormality. -B12 258.  TSH 1.671.  Ammonia was 61 on presentation.  Continue lactulose.  Monitor mental status which is improving.  Still slow to respond.  Recommend outpatient neurology evaluation and follow-up.  Leukocytosis -Resolved  Anemia of chronic disease -Questionable cause.  Hemoglobin stable.  No signs of bleeding  Chronic hypoxic respiratory failure -Respiratory status stable.  Currently requiring 3 L oxygen via nasal cannula.  Diabetes mellitus type 2 -A1c 5.6.    Carb modified diet.  Continue Metformin.  Generalized deconditioning -Overall prognosis is guarded to poor.  PT recommends SNF placement. Palliative care evaluation appreciated.  Patient is a DNR.  If condition worsens, consider hospice/comfort measures.  Continue outpatient  palliative care  evaluation and follow-up  Bilateral lower extremity chronic venous stasis ulcers: Present on admission -Wound care as per wound care consult recommendations   Discharge Instructions  Discharge Instructions    Amb Referral to Palliative Care   Complete by: As directed    Continue goals of care discussion   Diet - low sodium heart healthy   Complete by: As directed    Discharge wound care:   Complete by: As directed    Apply Xeroform gauzes Hart [place] [phone]) over any open wounds on BLE. Spiral wrap kerlex from just behind the toes to just below the knees. Then spiral wrap 4 inch ace bandages in the same manner. Perform daily. Remove BLE wraps and wash legs with soap and water, pat dry. Apply Sween Moisturizing ointment. Then follow wound care instruction.   Increase activity slowly   Complete by: As directed      Allergies as of 12/07/2019   No Known Allergies     Medication List    STOP taking these medications   aspirin 81 MG EC tablet   predniSONE 10 MG tablet Commonly known as: DELTASONE     TAKE these medications   apixaban 5 MG Tabs tablet Commonly known as: ELIQUIS Take 1 tablet (5 mg total) by mouth 2 (two) times daily.   cephALEXin 500 MG capsule Commonly known as: KEFLEX Take 1 capsule (500 mg total) by mouth 3 (three) times daily for 3 days.   digoxin 0.125 MG tablet Commonly known as: LANOXIN Take 1 tablet by mouth daily at 8 pm.   furosemide 40 MG tablet Commonly known as: Lasix Take 1 tablet (40 mg total) by mouth daily. What changed:   when to take this  reasons to take this   hydrocerin Crea Apply 1 application topically 2 (two) times daily.   lactulose 10 GM/15ML solution Commonly known as: CHRONULAC Take 15 mLs (10 g total) by mouth 2 (two) times daily.   metFORMIN 500 MG tablet Commonly known as: GLUCOPHAGE Take 500 mg by mouth 2 (two) times daily with a meal.   metoprolol tartrate 25 MG tablet Commonly known as:  LOPRESSOR Take 0.5 tablets (12.5 mg total) by mouth 2 (two) times daily.   polyethylene glycol 17 g packet Commonly known as: MIRALAX / GLYCOLAX Take 17 g by mouth daily as needed for moderate constipation.   pravastatin 40 MG tablet Commonly known as: PRAVACHOL Take 40 mg by mouth daily.   tamsulosin 0.4 MG Caps capsule Commonly known as: FLOMAX Take 1 capsule (0.4 mg total) by mouth daily.   Evaristo Bury FlexTouch 100 UNIT/ML FlexTouch Pen Generic drug: insulin degludec Inject 26 Units into the skin daily.            Discharge Care Instructions  (From admission, onward)         Start     Ordered   12/07/19 0000  Discharge wound care:       Comments: Apply Xeroform gauzes Hart [place] [phone]) over any open wounds on BLE. Spiral wrap kerlex from just behind the toes to just below the knees. Then spiral wrap 4 inch ace bandages in the same manner. Perform daily. Remove BLE wraps and wash legs with soap and water, pat dry. Apply Sween Moisturizing ointment. Then follow wound care instruction.   12/07/19 9147          Contact information for follow-up providers    Yates Decamp, MD. Schedule an appointment as soon as possible for a visit in 1 week(s).  Specialty: Cardiology Contact information: [address] [place] [address] [phone]        Beryle Beamsoonquah, Kofi, MD. Schedule an appointment as soon as possible for a visit in 1 week(s).   Specialty: Neurology Contact information: [address] [place] [address] [phone]            Contact information for after-discharge care    Destination    [facility] .   Service: Skilled Nursing Contact information: [address] [id number] [phone]                 No Known Allergies  Consultations:  Palliative care   Procedures/Studies: CT Head Wo Contrast  Result Date: 12/03/2019 CLINICAL DATA:  Mental status changes EXAM: CT HEAD WITHOUT  CONTRAST TECHNIQUE: Contiguous axial images were obtained from the base of the skull through the vertex without intravenous contrast. COMPARISON:  10/19/2019 FINDINGS: Brain: There is atrophy and chronic small vessel disease changes. No acute intracranial abnormality. Specifically, no hemorrhage, hydrocephalus, mass lesion, acute infarction, or significant intracranial injury. Vascular: No hyperdense vessel or unexpected calcification. Skull: No acute calvarial abnormality. Sinuses/Orbits: Visualized paranasal sinuses and mastoids clear. Orbital soft tissues unremarkable. Other: None IMPRESSION: Atrophy, chronic microvascular disease. No acute intracranial abnormality. Electronically Signed   By: Charlett NoseKevin  Dover M.D.   On: 12/03/2019 22:13   DG Chest Portable 1 View  Result Date: 12/03/2019 CLINICAL DATA:  Shortness of breath EXAM: PORTABLE CHEST 1 VIEW COMPARISON:  10/20/2019 FINDINGS: Small bilateral pleural effusions. Cardiomegaly with central vascular congestion. No pneumothorax. Aortic atherosclerosis. IMPRESSION: Cardiomegaly with central vascular congestion and small bilateral pleural effusions. Electronically Signed   By: Jasmine PangKim  Fujinaga M.D.   On: 12/03/2019 19:48       Subjective: Patient seen and examined at bedside.  He is a poor historian but states that he feels slightly better.  "Feel less disillusioned".  No overnight fever, vomiting, chest pain reported.  Discharge Exam: Vitals:   12/06/19 2150 12/07/19 0606  BP: (!) 125/59 123/83  Pulse: (!) 54 62  Resp: 18 (!) 22  Temp: 98.6 F (37 C) 98 F (36.7 C)  SpO2: 99% 100%    General: Pt is awake, poor historian.  No acute distress.   Cardiovascular: Mildly bradycardic; S1/S2 + Respiratory: bilateral decreased breath sounds at bases with some scattered crackles Abdominal: Soft, obese, NT, ND, bowel sounds + Extremities: Bilateral lower extremity edema present.  Lower extremity dressing present.  No cyanosis    The results of  significant diagnostics from this hospitalization (including imaging, microbiology, ancillary and laboratory) are listed below for reference.     Microbiology: Recent Results (from the past 240 hour(s))  Urine culture     Status: Abnormal   Collection Time: 12/03/19  9:10 PM   Specimen: In/Out Cath Urine  Result Value Ref Range Status   Specimen Description   Final    IN/OUT CATH URINE Performed at [facility], [address] [place], [place] [address]    Special Requests   Final    NONE Performed at [facility], [address] [place], [place] [address]    Culture >=100,000 COLONIES/mL PROTEUS MIRABILIS (A)  Final   Report Status 12/06/2019 FINAL  Final   Organism ID, Bacteria PROTEUS MIRABILIS (A)  Final      Susceptibility   Proteus mirabilis - MIC*    AMPICILLIN <=2 SENSITIVE Sensitive     CEFAZOLIN <=4 SENSITIVE Sensitive  CEFTRIAXONE <=0.25 SENSITIVE Sensitive     CIPROFLOXACIN <=0.25 SENSITIVE Sensitive     GENTAMICIN <=1 SENSITIVE Sensitive     IMIPENEM 4 SENSITIVE Sensitive     NITROFURANTOIN 256 RESISTANT Resistant     TRIMETH/SULFA <=20 SENSITIVE Sensitive     AMPICILLIN/SULBACTAM <=2 SENSITIVE Sensitive     PIP/TAZO <=4 SENSITIVE Sensitive     * >=100,000 COLONIES/mL PROTEUS MIRABILIS  SARS Coronavirus 2 by RT PCR (hospital order, performed in [facility] hospital lab) Nasopharyngeal Nasopharyngeal Swab     Status: None   Collection Time: 12/03/19 11:44 PM   Specimen: Nasopharyngeal Swab  Result Value Ref Range Status   SARS Coronavirus 2 NEGATIVE NEGATIVE Final    Comment: (NOTE) SARS-CoV-2 target nucleic acids are NOT DETECTED.  The SARS-CoV-2 RNA is generally detectable in upper and lower respiratory specimens during the acute phase of infection. The lowest concentration of SARS-CoV-2 viral copies this assay can detect is 250 copies / mL. A negative result does not preclude SARS-CoV-2 infection and should not be used as the sole basis for treatment or  other patient management decisions.  A negative result may occur with improper specimen collection / handling, submission of specimen other than nasopharyngeal swab, presence of viral mutation(s) within the areas targeted by this assay, and inadequate number of viral copies (<250 copies / mL). A negative result must be combined with clinical observations, patient history, and epidemiological information.  Fact Sheet for Patients:   BoilerBrush.com.cy  Fact Sheet for Healthcare Providers: https://pope.com/  This test is not yet approved or  cleared by the [place] FDA and has been authorized for detection and/or diagnosis of SARS-CoV-2 by FDA under an Emergency Use Authorization (EUA).  This EUA will remain in effect (meaning this test can be used) for the duration of the COVID-19 declaration under Section 564(b)(1) of the Act, 21 U.S.C. section 360bbb-3(b)(1), unless the authorization is terminated or revoked sooner.  Performed at [facility], [address], [place], [place] [address]   Blood Culture (routine x 2)     Status: None (Preliminary result)   Collection Time: 12/03/19 11:48 PM   Specimen: BLOOD LEFT ARM  Result Value Ref Range Status   Specimen Description BLOOD LEFT ARM  Final   Special Requests   Final    BOTTLES DRAWN AEROBIC AND ANAEROBIC Blood Culture adequate volume   Culture   Final    NO GROWTH 3 DAYS Performed at [facility], [address] [place], [place] [address]    Report Status PENDING  Incomplete  Blood Culture (routine x 2)     Status: None (Preliminary result)   Collection Time: 12/03/19 11:59 PM   Specimen: BLOOD LEFT HAND  Result Value Ref Range Status   Specimen Description BLOOD LEFT HAND  Final   Special Requests   Final    BOTTLES DRAWN AEROBIC AND ANAEROBIC Blood Culture adequate volume   Culture   Final    NO GROWTH 3 DAYS Performed at [facility], [address] [place],  [place] [address]    Report Status PENDING  Incomplete     Labs: BNP (last 3 results) Recent Labs    05/13/19 1359 06/02/19 1645 12/03/19 1947  BNP 502.0* 241.0* 178.0*   Basic Metabolic Panel: Recent Labs  Lab 12/03/19 1947 12/05/19 0628 12/06/19 0638 12/07/19 0754  NA 144 142 140 141  K 4.8 4.3 4.0 4.5  CL 91* 90* 87* 87*  CO2 41* 43* 42* 42*  GLUCOSE 108*  116* 96 100*  BUN 24* 22 23 30*  CREATININE 0.66 0.50* 0.54* 0.64  CALCIUM 8.9 9.0 8.8* 8.9  MG  --  1.9 1.9 1.9   Liver Function Tests: Recent Labs  Lab 12/06/19 0638  AST 15  ALT 9  ALKPHOS 49  BILITOT 0.7  PROT 6.5  ALBUMIN 2.8*   No results for input(s): LIPASE, AMYLASE in the last 168 hours. Recent Labs  Lab 12/04/19 0336 12/06/19 0638  AMMONIA 61* 42*   CBC: Recent Labs  Lab 12/03/19 1947 12/04/19 0336 12/05/19 0628 12/06/19 0638 12/07/19 0754  WBC 13.3* 13.4* 11.6* 10.6* 9.2  NEUTROABS  --   --  9.2* 7.5 6.8  HGB 9.1* 9.0* 9.0* 8.6* 8.6*  HCT 35.4* 36.2* 36.1* 32.7* 33.3*  MCV 96.2 98.1 97.3 94.0 94.9  PLT 311 307 316 298 301   Cardiac Enzymes: No results for input(s): CKTOTAL, CKMB, CKMBINDEX, TROPONINI in the last 168 hours. BNP: Invalid input(s): POCBNP CBG: Recent Labs  Lab 12/06/19 0737 12/06/19 1120 12/06/19 1617 12/06/19 2207 12/07/19 0752  GLUCAP 101* 134* 122* 127* 105*   D-Dimer No results for input(s): DDIMER in the last 72 hours. Hgb A1c No results for input(s): HGBA1C in the last 72 hours. Lipid Profile No results for input(s): CHOL, HDL, LDLCALC, TRIG, CHOLHDL, LDLDIRECT in the last 72 hours. Thyroid function studies No results for input(s): TSH, T4TOTAL, T3FREE, THYROIDAB in the last 72 hours.  Invalid input(s): FREET3 Anemia work up No results for input(s): VITAMINB12, FOLATE, FERRITIN, TIBC, IRON, RETICCTPCT in the last 72 hours. Urinalysis    Component Value Date/Time   COLORURINE YELLOW 12/03/2019 2110   APPEARANCEUR CLOUDY (A) 12/03/2019 2110    LABSPEC 1.017 12/03/2019 2110   PHURINE 6.0 12/03/2019 2110   GLUCOSEU NEGATIVE 12/03/2019 2110   HGBUR LARGE (A) 12/03/2019 2110   BILIRUBINUR NEGATIVE 12/03/2019 2110   KETONESUR NEGATIVE 12/03/2019 2110   PROTEINUR 100 (A) 12/03/2019 2110   UROBILINOGEN 0.2 03/15/2014 1254   NITRITE POSITIVE (A) 12/03/2019 2110   LEUKOCYTESUR LARGE (A) 12/03/2019 2110   Sepsis Labs Invalid input(s): PROCALCITONIN,  WBC,  LACTICIDVEN Microbiology Recent Results (from the past 240 hour(s))  Urine culture     Status: Abnormal   Collection Time: 12/03/19  9:10 PM   Specimen: In/Out Cath Urine  Result Value Ref Range Status   Specimen Description   Final    IN/OUT CATH URINE Performed at [facility], [address] [place], [place] [address]    Special Requests   Final    NONE Performed at [facility], [address], [place], [place] [address]    Culture >=100,000 COLONIES/mL PROTEUS MIRABILIS (A)  Final   Report Status 12/06/2019 FINAL  Final   Organism ID, Bacteria PROTEUS MIRABILIS (A)  Final      Susceptibility   Proteus mirabilis - MIC*    AMPICILLIN <=2 SENSITIVE Sensitive     CEFAZOLIN <=4 SENSITIVE Sensitive     CEFTRIAXONE <=0.25 SENSITIVE Sensitive     CIPROFLOXACIN <=0.25 SENSITIVE Sensitive     GENTAMICIN <=1 SENSITIVE Sensitive     IMIPENEM 4 SENSITIVE Sensitive     NITROFURANTOIN 256 RESISTANT Resistant     TRIMETH/SULFA <=20 SENSITIVE Sensitive     AMPICILLIN/SULBACTAM <=2 SENSITIVE Sensitive     PIP/TAZO <=4 SENSITIVE Sensitive     * >=100,000 COLONIES/mL PROTEUS MIRABILIS  SARS Coronavirus 2 by RT PCR (hospital order, performed in [facility] hospital lab) Nasopharyngeal Nasopharyngeal Swab  Status: None   Collection Time: 12/03/19 11:44 PM   Specimen: Nasopharyngeal Swab  Result Value Ref Range Status   SARS Coronavirus 2 NEGATIVE NEGATIVE Final    Comment: (NOTE) SARS-CoV-2 target nucleic acids are NOT DETECTED.  The SARS-CoV-2 RNA is generally detectable in  upper and lower respiratory specimens during the acute phase of infection. The lowest concentration of SARS-CoV-2 viral copies this assay can detect is 250 copies / mL. A negative result does not preclude SARS-CoV-2 infection and should not be used as the sole basis for treatment or other patient management decisions.  A negative result may occur with improper specimen collection / handling, submission of specimen other than nasopharyngeal swab, presence of viral mutation(s) within the areas targeted by this assay, and inadequate number of viral copies (<250 copies / mL). A negative result must be combined with clinical observations, patient history, and epidemiological information.  Fact Sheet for Patients:   BoilerBrush.com.cy  Fact Sheet for Healthcare Providers: https://pope.com/  This test is not yet approved or  cleared by the [place] FDA and has been authorized for detection and/or diagnosis of SARS-CoV-2 by FDA under an Emergency Use Authorization (EUA).  This EUA will remain in effect (meaning this test can be used) for the duration of the COVID-19 declaration under Section 564(b)(1) of the Act, 21 U.S.C. section 360bbb-3(b)(1), unless the authorization is terminated or revoked sooner.  Performed at [facility], [address], [place], [place] [address]   Blood Culture (routine x 2)     Status: None (Preliminary result)   Collection Time: 12/03/19 11:48 PM   Specimen: BLOOD LEFT ARM  Result Value Ref Range Status   Specimen Description BLOOD LEFT ARM  Final   Special Requests   Final    BOTTLES DRAWN AEROBIC AND ANAEROBIC Blood Culture adequate volume   Culture   Final    NO GROWTH 3 DAYS Performed at [facility], [address] [place], [place] [address]    Report Status PENDING  Incomplete  Blood Culture (routine x 2)     Status: None (Preliminary result)   Collection Time: 12/03/19 11:59 PM   Specimen: BLOOD  LEFT HAND  Result Value Ref Range Status   Specimen Description BLOOD LEFT HAND  Final   Special Requests   Final    BOTTLES DRAWN AEROBIC AND ANAEROBIC Blood Culture adequate volume   Culture   Final    NO GROWTH 3 DAYS Performed at [facility], [address], [place], [place] [address]    Report Status PENDING  Incomplete     Time coordinating discharge: 35 minutes  SIGNED:   Glade Lloyd, MD  [facility] 12/07/2019, 9:53 AM

## 2019-12-07 NOTE — Progress Notes (Signed)
Notified by telemetry that pt had an 8 beat run of v-tach. Pt sleeping at the time, HR was in the 60's. Notified Dr. Robb Matar to make aware, no new orders. Will continue to monitor.

## 2019-12-07 NOTE — TOC Transition Note (Addendum)
Transition of Care [facility]) - CM/SW Discharge Note   Patient Details  Name: Jonathan Wise MRN: [id number] Date of Birth: January 07, 1946  Transition of Care [facility]) CM/SW Contact:  Li Fragoso, Chrystine Oiler, RN Phone Number: 12/07/2019, 1:38 PM   Clinical Narrative:   Patient discharging today. Discussed with Melissa at [facility], who is ready to receive patient. Patient will be going to room 414B. DC summary, FL2, and covid results have been faxed.  EMS arranged. Called wife on home and mobile, no options to leave VM.     Final next level of care: Home w Home Health Services Barriers to Discharge: Continued Medical Work up   Patient Goals and CMS Choice Patient states their goals for this hospitalization and ongoing recovery are:: Discharge home with [facility] CMS Medicare.gov Compare Post Acute Care list provided to:: Patient Choice offered to / list presented to : Patient     Discharge Plan and Services In-house Referral: Clinical Social Work, Hospice / Palliative Care Discharge Planning Services: NA Post Acute Care Choice: Home Health          DME Arranged: N/A DME Agency: NA        Social Determinants of Health (SDOH) Interventions     Readmission Risk Interventions Readmission Risk Prevention Plan 12/07/2019 12/04/2019 10/30/2019  Transportation Screening - Complete Complete  PCP or Specialist Appt within 5-7 Days - - Not Complete  Not Complete comments - - disposition pending  PCP or Specialist Appt within 3-5 Days Complete - -  Home Care Screening - - Complete  Medication Review (RN CM) - - Referral to Pharmacy  HRI or Home Care Consult - Complete -  Social Work Consult for Recovery Care Planning/Counseling - Complete -  Palliative Care Screening - Not Complete -  Palliative Care Screening Not Complete Comments - Palliative consulted; has not seen him yet -  Medication Review Oceanographer) - Complete -  Some recent data might be hidden

## 2019-12-09 LAB — CULTURE, BLOOD (ROUTINE X 2)
Culture: NO GROWTH
Culture: NO GROWTH
Special Requests: ADEQUATE
Special Requests: ADEQUATE

## 2019-12-12 ENCOUNTER — Ambulatory Visit: Payer: Self-pay | Admitting: Cardiology

## 2019-12-12 ENCOUNTER — Encounter: Payer: Self-pay | Admitting: Cardiology

## 2019-12-12 NOTE — Progress Notes (Deleted)
Patient referred by Christain Sacramento, MD for ***  Subjective:   Jonathan Wise, male    DOB: 09/13/1945, 74 y.o.   MRN: [id number]  *** No chief complaint on file.   *** HPI  74 year old Caucasian male with hypertension, hyperlipidemia, type 2 diabetes mellitus, paroxysmal A. fib, OSA, h/o DVT, anemia of chronic disease  Patient was recently hospitalized in 11/2019 with severe sepsis due to UTI.  He was also found to have acute on chronic HFpEF.  Apparently patient has been refusing Lasix and is.  He was diuresed with IV Lasix.  Given his generalized deconditioning poor prognosis, palliative care was involved.  He was discharged to SNF with DNR status.   *** Past Medical History:  Diagnosis Date  . Atrial fibrillation ([place])    On Pradaxa  . Bilateral lower extremity edema   . Cellulitis   . DVT (deep venous thrombosis) ([facility]) 1994   LLE. Completed tx with coumadin  . Hyperlipidemia   . Hypertension   . Kidney stones   . Morbid obesity ([place])   . Pressure ulcer   . Pulmonary artery hypertension ([place]) 10/18/2011  . Sleep apnea   . Type 2 diabetes mellitus ([place])   . Urinary retention 10/15/2011  . Venous stasis ulcers ([facility])     *** Past Surgical History:  Procedure Laterality Date  . CYSTOSCOPY  10/18/2011   Procedure: CYSTOSCOPY FLEXIBLE;  Surgeon: Marissa Nestle, MD;  Location: [facility];  Service: Urology;  Laterality: N/A;  . HERNIA REPAIR     Umbilical    *** Social History   Tobacco Use  Smoking Status Former Smoker  Smokeless Tobacco Never Used    Social History   Substance and Sexual Activity  Alcohol Use No  . Alcohol/week: 0.0 standard drinks    *** Family History  Problem Relation Age of Onset  . Diabetes Mother   . Emphysema Father     *** Current Outpatient Medications on File Prior to Visit  Medication Sig Dispense Refill  . apixaban (ELIQUIS) 5 MG TABS tablet Take 1 tablet (5 mg total) by mouth 2 (two) times daily. 60 tablet 0  .  digoxin (LANOXIN) 0.125 MG tablet Take 1 tablet by mouth daily at 8 pm.    . furosemide (LASIX) 40 MG tablet Take 1 tablet (40 mg total) by mouth daily. 30 tablet 0  . hydrocerin (EUCERIN) CREA Apply 1 application topically 2 (two) times daily.  0  . lactulose (CHRONULAC) 10 GM/15ML solution Take 15 mLs (10 g total) by mouth 2 (two) times daily. 236 mL 0  . metFORMIN (GLUCOPHAGE) 500 MG tablet Take 500 mg by mouth 2 (two) times daily with a meal.    . metoprolol tartrate (LOPRESSOR) 25 MG tablet Take 0.5 tablets (12.5 mg total) by mouth 2 (two) times daily. 30 tablet 1  . polyethylene glycol (MIRALAX / GLYCOLAX) 17 g packet Take 17 g by mouth daily as needed for moderate constipation. 30 each 0  . pravastatin (PRAVACHOL) 40 MG tablet Take 40 mg by mouth daily.    . tamsulosin (FLOMAX) 0.4 MG CAPS capsule Take 1 capsule (0.4 mg total) by mouth daily. 30 capsule 1  . TRESIBA FLEXTOUCH 100 UNIT/ML FlexTouch Pen Inject 26 Units into the skin daily.     No current facility-administered medications on file prior to visit.    Cardiovascular and other pertinent studies:  *** EKG ***/***/202***: ***  Echocardiogram 10/20/2019: 1. Extremely limited; definity used; normal  LV function.  2. Left ventricular ejection fraction, by estimation, is 55 to 60%. The  left ventricle has normal function. The left ventricle has no regional  wall motion abnormalities. Left ventricular diastolic parameters are  indeterminate. Elevated left atrial  pressure.  3. Right ventricular systolic function was not well visualized. The right  ventricular size is not well visualized.  4. The mitral valve is normal in structure. Trivial mitral valve  regurgitation. No evidence of mitral stenosis.  5. The aortic valve was not well visualized. Aortic valve regurgitation  is not visualized. No aortic stenosis is present.  6. The inferior vena cava is dilated in size with >50% respiratory  variability, suggesting right  atrial pressure of 8 mmHg.   *** Recent labs: 12/07/2019: Glucose 100, BUN/Cr 30/0.64. EGFR >60. Na/K 141/4.5. Albumin 2.8. Rest of the CMP normal H/H 8.6/33.3. MCV 94. Platelets 301 TSH 1.6 normal   *** ROS      *** There were no vitals filed for this visit.   There is no height or weight on file to calculate BMI. There were no vitals filed for this visit.  *** Objective:   Physical Exam    ***     Assessment & Recommendations:   ***  ***     Thank you for referring the patient to [place]. Please feel free to contact with any questions.   Nigel Mormon, MD Pager: [phone] Office: [phone]

## 2019-12-30 NOTE — Progress Notes (Signed)
Patient referred by Christain Sacramento, MD for atrial fibrillation  Subjective:   Jonathan Wise, male    DOB: Feb 16, 1946, 74 y.o.   MRN: [id number]   Chief Complaint  Patient presents with  . Atrial Fibrillation  . Hospitalization Follow-up  . Follow-up    1 week     HPI  74 year old Caucasian male with hypertension, hyperlipidemia, type 2 diabetes mellitus, persistent A. fib, OSA, h/o DVT, anemia of chronic disease  Patient was recently hospitalized in 11/2019 with severe sepsis due to UTI.  He was also found to have acute on chronic HFpEF.  He was diuresed with IV Lasix.  Given his generalized deconditioning poor prognosis, palliative care was involved.  He was discharged to SNF with DNR status.  Patient is currently staying at [facility] facility.  His physical activity is severely limited.  He tells me that he has not been able to walk since 2003, and has not been able to stand up in last 6 to 7 years.  He is not quite sure what the diagnosis or etiology of this is.  He has been on 2-3 L oxygen for last 2-3 years.  At rest, he does not have any chest pain, shortness of breath symptoms.  His hemoglobin is chronically low.  He does not have any obvious bleeding.   Past Medical History:  Diagnosis Date  . Atrial fibrillation ([place])    On Pradaxa  . Bilateral lower extremity edema   . Cellulitis   . DVT (deep venous thrombosis) ([facility]) 1994   LLE. Completed tx with coumadin  . Hyperlipidemia   . Hypertension   . Kidney stones   . Morbid obesity ([place])   . Pressure ulcer   . Sleep apnea   . Type 2 diabetes mellitus ([place])   . Urinary retention 10/15/2011  . Venous stasis ulcers ([facility])      Past Surgical History:  Procedure Laterality Date  . CYSTOSCOPY  10/18/2011   Procedure: CYSTOSCOPY FLEXIBLE;  Surgeon: Marissa Nestle, MD;  Location: [facility];  Service: Urology;  Laterality: N/A;  . HERNIA REPAIR     Umbilical     Social History   Tobacco Use  Smoking  Status Former Smoker  Smokeless Tobacco Never Used    Social History   Substance and Sexual Activity  Alcohol Use No  . Alcohol/week: 0.0 standard drinks     Family History  Problem Relation Age of Onset  . Diabetes Mother   . Emphysema Father      Current Outpatient Medications on File Prior to Visit  Medication Sig Dispense Refill  . apixaban (ELIQUIS) 5 MG TABS tablet Take 1 tablet (5 mg total) by mouth 2 (two) times daily. 60 tablet 0  . digoxin (LANOXIN) 0.125 MG tablet Take 1 tablet by mouth daily at 8 pm.    . furosemide (LASIX) 40 MG tablet Take 1 tablet (40 mg total) by mouth daily. 30 tablet 0  . hydrocerin (EUCERIN) CREA Apply 1 application topically 2 (two) times daily.  0  . lactulose (CHRONULAC) 10 GM/15ML solution Take 15 mLs (10 g total) by mouth 2 (two) times daily. 236 mL 0  . metFORMIN (GLUCOPHAGE) 500 MG tablet Take 500 mg by mouth 2 (two) times daily with a meal.    . metoprolol tartrate (LOPRESSOR) 25 MG tablet Take 0.5 tablets (12.5 mg total) by mouth 2 (two) times daily. 30 tablet 1  . polyethylene glycol (MIRALAX / GLYCOLAX) 17  g packet Take 17 g by mouth daily as needed for moderate constipation. 30 each 0  . pravastatin (PRAVACHOL) 40 MG tablet Take 40 mg by mouth daily.    . tamsulosin (FLOMAX) 0.4 MG CAPS capsule Take 1 capsule (0.4 mg total) by mouth daily. 30 capsule 1  . TRESIBA FLEXTOUCH 100 UNIT/ML FlexTouch Pen Inject 26 Units into the skin daily.     No current facility-administered medications on file prior to visit.    Cardiovascular and other pertinent studies:  EKG 12/31/2019: Atrial fibrillation 86 bpm   Echocardiogram 10/20/2019: 1. Extremely limited; definity used; normal LV function.  2. Left ventricular ejection fraction, by estimation, is 55 to 60%. The  left ventricle has normal function. The left ventricle has no regional  wall motion abnormalities. Left ventricular diastolic parameters are  indeterminate. Elevated left  atrial  pressure.  3. Right ventricular systolic function was not well visualized. The right  ventricular size is not well visualized.  4. The mitral valve is normal in structure. Trivial mitral valve  regurgitation. No evidence of mitral stenosis.  5. The aortic valve was not well visualized. Aortic valve regurgitation  is not visualized. No aortic stenosis is present.  6. The inferior vena cava is dilated in size with >50% respiratory  variability, suggesting right atrial pressure of 8 mmHg.    Recent labs: 12/07/2019: Glucose 100, BUN/Cr 30/0.64. EGFR >60. Na/K 141/4.5. Albumin 2.8. Rest of the CMP normal H/H 8.6/33.3. MCV 94. Platelets 301 TSH 1.6 normal    Review of Systems  Cardiovascular: Negative for chest pain, dyspnea on exertion, leg swelling, palpitations and syncope.          Vitals:   12/31/19 0929  BP: (!) 98/46  Pulse: 69  Resp: 16  SpO2: 98%     Body mass index is 37.67 kg/m.   Objective:    Physical Exam Vitals and nursing note reviewed.  Constitutional:      Appearance: He is ill-appearing. He is not toxic-appearing.  Neck:     Vascular: No JVD.  Cardiovascular:     Rate and Rhythm: Normal rate. Rhythm irregular.     Heart sounds: Normal heart sounds. No murmur heard.   Pulmonary:     Effort: Pulmonary effort is normal.     Breath sounds: Normal breath sounds. No wheezing or rales.  Musculoskeletal:     Right lower leg: No edema.     Left lower leg: No edema.     Comments: Unna boots in both legs. Denies any ulcers/ wound on legs.          Assessment & Recommendations:   74 year old Caucasian male with hypertension, hyperlipidemia, type 2 diabetes mellitus, persistent A. fib, OSA, h/o DVT, anemia of chronic disease  Persistent A. Fib: Rate controlled on low-dose metoprolol and digoxin.  Tolerating well. CHA2DS2-VASc: At least 3, annual stroke risk 3%. In spite of his chronic anemia, seems to be tolerating anticoagulation  well.  Also has history of DVT.  Therefore, continue Eliquis 5 mg twice daily.  Patient has poor baseline functional capacity, severe deconditioning, is DNR.  I do not recommend any further cardiac investigation at this time.  Continue current medications.  He would like to see Dr. Einar Gip on as-needed basis.   Thank you for referring the patient to [place]. Please feel free to contact with any questions.   Nigel Mormon, MD Pager: [phone] Office: [phone]

## 2019-12-31 ENCOUNTER — Other Ambulatory Visit: Payer: Self-pay

## 2019-12-31 ENCOUNTER — Ambulatory Visit: Payer: Medicare Other | Admitting: Cardiology

## 2019-12-31 ENCOUNTER — Encounter: Payer: Self-pay | Admitting: Cardiology

## 2019-12-31 VITALS — BP 98/46 | HR 69 | Resp 16 | Ht 73.0 in

## 2019-12-31 DIAGNOSIS — I4811 Longstanding persistent atrial fibrillation: Secondary | ICD-10-CM

## 2020-03-05 ENCOUNTER — Other Ambulatory Visit: Payer: Self-pay

## 2020-03-05 ENCOUNTER — Emergency Department (HOSPITAL_COMMUNITY): Payer: Medicare Other

## 2020-03-05 ENCOUNTER — Inpatient Hospital Stay (HOSPITAL_COMMUNITY)
Admission: EM | Admit: 2020-03-05 | Discharge: 2020-03-13 | DRG: 871 | Disposition: A | Discharge status: Hospice | Payer: Medicare Other | Attending: Family Medicine | Admitting: Family Medicine

## 2020-03-05 ENCOUNTER — Inpatient Hospital Stay (HOSPITAL_COMMUNITY): Payer: Medicare Other

## 2020-03-05 DIAGNOSIS — L97919 Non-pressure chronic ulcer of unspecified part of right lower leg with unspecified severity: Secondary | ICD-10-CM | POA: Diagnosis present

## 2020-03-05 DIAGNOSIS — R7989 Other specified abnormal findings of blood chemistry: Secondary | ICD-10-CM | POA: Diagnosis not present

## 2020-03-05 DIAGNOSIS — R0600 Dyspnea, unspecified: Secondary | ICD-10-CM

## 2020-03-05 DIAGNOSIS — E441 Mild protein-calorie malnutrition: Secondary | ICD-10-CM | POA: Diagnosis present

## 2020-03-05 DIAGNOSIS — E1165 Type 2 diabetes mellitus with hyperglycemia: Secondary | ICD-10-CM | POA: Diagnosis not present

## 2020-03-05 DIAGNOSIS — R609 Edema, unspecified: Secondary | ICD-10-CM

## 2020-03-05 DIAGNOSIS — I4891 Unspecified atrial fibrillation: Secondary | ICD-10-CM | POA: Diagnosis present

## 2020-03-05 DIAGNOSIS — Z20822 Contact with and (suspected) exposure to covid-19: Secondary | ICD-10-CM | POA: Diagnosis present

## 2020-03-05 DIAGNOSIS — E876 Hypokalemia: Secondary | ICD-10-CM | POA: Diagnosis present

## 2020-03-05 DIAGNOSIS — I110 Hypertensive heart disease with heart failure: Secondary | ICD-10-CM | POA: Diagnosis present

## 2020-03-05 DIAGNOSIS — A419 Sepsis, unspecified organism: Principal | ICD-10-CM | POA: Diagnosis present

## 2020-03-05 DIAGNOSIS — Z515 Encounter for palliative care: Secondary | ICD-10-CM

## 2020-03-05 DIAGNOSIS — L270 Generalized skin eruption due to drugs and medicaments taken internally: Secondary | ICD-10-CM | POA: Diagnosis not present

## 2020-03-05 DIAGNOSIS — I493 Ventricular premature depolarization: Secondary | ICD-10-CM | POA: Diagnosis not present

## 2020-03-05 DIAGNOSIS — Z833 Family history of diabetes mellitus: Secondary | ICD-10-CM

## 2020-03-05 DIAGNOSIS — R0603 Acute respiratory distress: Secondary | ICD-10-CM

## 2020-03-05 DIAGNOSIS — J9602 Acute respiratory failure with hypercapnia: Secondary | ICD-10-CM | POA: Insufficient documentation

## 2020-03-05 DIAGNOSIS — Z87442 Personal history of urinary calculi: Secondary | ICD-10-CM

## 2020-03-05 DIAGNOSIS — Z86718 Personal history of other venous thrombosis and embolism: Secondary | ICD-10-CM

## 2020-03-05 DIAGNOSIS — J9622 Acute and chronic respiratory failure with hypercapnia: Secondary | ICD-10-CM | POA: Diagnosis present

## 2020-03-05 DIAGNOSIS — R262 Difficulty in walking, not elsewhere classified: Secondary | ICD-10-CM | POA: Diagnosis present

## 2020-03-05 DIAGNOSIS — I480 Paroxysmal atrial fibrillation: Secondary | ICD-10-CM | POA: Diagnosis present

## 2020-03-05 DIAGNOSIS — I5031 Acute diastolic (congestive) heart failure: Secondary | ICD-10-CM | POA: Diagnosis not present

## 2020-03-05 DIAGNOSIS — E785 Hyperlipidemia, unspecified: Secondary | ICD-10-CM | POA: Diagnosis present

## 2020-03-05 DIAGNOSIS — E46 Unspecified protein-calorie malnutrition: Secondary | ICD-10-CM | POA: Diagnosis not present

## 2020-03-05 DIAGNOSIS — L97929 Non-pressure chronic ulcer of unspecified part of left lower leg with unspecified severity: Secondary | ICD-10-CM | POA: Diagnosis present

## 2020-03-05 DIAGNOSIS — J9621 Acute and chronic respiratory failure with hypoxia: Secondary | ICD-10-CM | POA: Diagnosis present

## 2020-03-05 DIAGNOSIS — R778 Other specified abnormalities of plasma proteins: Secondary | ICD-10-CM | POA: Diagnosis not present

## 2020-03-05 DIAGNOSIS — J189 Pneumonia, unspecified organism: Secondary | ICD-10-CM | POA: Diagnosis present

## 2020-03-05 DIAGNOSIS — Z7901 Long term (current) use of anticoagulants: Secondary | ICD-10-CM

## 2020-03-05 DIAGNOSIS — T41295A Adverse effect of other general anesthetics, initial encounter: Secondary | ICD-10-CM | POA: Diagnosis not present

## 2020-03-05 DIAGNOSIS — Z79899 Other long term (current) drug therapy: Secondary | ICD-10-CM

## 2020-03-05 DIAGNOSIS — R6520 Severe sepsis without septic shock: Secondary | ICD-10-CM | POA: Diagnosis present

## 2020-03-05 DIAGNOSIS — J440 Chronic obstructive pulmonary disease with acute lower respiratory infection: Secondary | ICD-10-CM | POA: Diagnosis present

## 2020-03-05 DIAGNOSIS — R6510 Systemic inflammatory response syndrome (SIRS) of non-infectious origin without acute organ dysfunction: Secondary | ICD-10-CM

## 2020-03-05 DIAGNOSIS — Z9981 Dependence on supplemental oxygen: Secondary | ICD-10-CM

## 2020-03-05 DIAGNOSIS — Z825 Family history of asthma and other chronic lower respiratory diseases: Secondary | ICD-10-CM

## 2020-03-05 DIAGNOSIS — J90 Pleural effusion, not elsewhere classified: Secondary | ICD-10-CM | POA: Diagnosis not present

## 2020-03-05 DIAGNOSIS — G4733 Obstructive sleep apnea (adult) (pediatric): Secondary | ICD-10-CM | POA: Diagnosis present

## 2020-03-05 DIAGNOSIS — I825Y9 Chronic embolism and thrombosis of unspecified deep veins of unspecified proximal lower extremity: Secondary | ICD-10-CM | POA: Diagnosis not present

## 2020-03-05 DIAGNOSIS — Z7984 Long term (current) use of oral hypoglycemic drugs: Secondary | ICD-10-CM

## 2020-03-05 DIAGNOSIS — I5033 Acute on chronic diastolic (congestive) heart failure: Secondary | ICD-10-CM | POA: Diagnosis present

## 2020-03-05 DIAGNOSIS — I952 Hypotension due to drugs: Secondary | ICD-10-CM | POA: Diagnosis not present

## 2020-03-05 DIAGNOSIS — G9341 Metabolic encephalopathy: Secondary | ICD-10-CM | POA: Diagnosis present

## 2020-03-05 DIAGNOSIS — E873 Alkalosis: Secondary | ICD-10-CM | POA: Diagnosis present

## 2020-03-05 DIAGNOSIS — Z7189 Other specified counseling: Secondary | ICD-10-CM | POA: Diagnosis not present

## 2020-03-05 DIAGNOSIS — Z87891 Personal history of nicotine dependence: Secondary | ICD-10-CM

## 2020-03-05 DIAGNOSIS — Z66 Do not resuscitate: Secondary | ICD-10-CM | POA: Diagnosis present

## 2020-03-05 DIAGNOSIS — Z6837 Body mass index (BMI) 37.0-37.9, adult: Secondary | ICD-10-CM

## 2020-03-05 DIAGNOSIS — R0602 Shortness of breath: Secondary | ICD-10-CM

## 2020-03-05 DIAGNOSIS — I248 Other forms of acute ischemic heart disease: Secondary | ICD-10-CM | POA: Diagnosis present

## 2020-03-05 DIAGNOSIS — I82409 Acute embolism and thrombosis of unspecified deep veins of unspecified lower extremity: Secondary | ICD-10-CM | POA: Diagnosis present

## 2020-03-05 DIAGNOSIS — T368X5A Adverse effect of other systemic antibiotics, initial encounter: Secondary | ICD-10-CM | POA: Diagnosis not present

## 2020-03-05 LAB — BLOOD GAS, ARTERIAL
Acid-Base Excess: 14 mmol/L — ABNORMAL HIGH (ref 0.0–2.0)
Bicarbonate: 37.7 mmol/L — ABNORMAL HIGH (ref 20.0–28.0)
FIO2: 100
O2 Saturation: 100 %
Patient temperature: 35.9
pCO2 arterial: 40.5 mmHg (ref 32.0–48.0)
pH, Arterial: 7.572 — ABNORMAL HIGH (ref 7.350–7.450)
pO2, Arterial: 308 mmHg — ABNORMAL HIGH (ref 83.0–108.0)

## 2020-03-05 LAB — CBC WITH DIFFERENTIAL/PLATELET
Abs Immature Granulocytes: 0.1 10*3/uL — ABNORMAL HIGH (ref 0.00–0.07)
Basophils Absolute: 0.1 10*3/uL (ref 0.0–0.1)
Basophils Relative: 0 %
Eosinophils Absolute: 0 10*3/uL (ref 0.0–0.5)
Eosinophils Relative: 0 %
HCT: 37 % — ABNORMAL LOW (ref 39.0–52.0)
Hemoglobin: 8.9 g/dL — ABNORMAL LOW (ref 13.0–17.0)
Immature Granulocytes: 1 %
Lymphocytes Relative: 10 %
Lymphs Abs: 1.4 10*3/uL (ref 0.7–4.0)
MCH: 22.9 pg — ABNORMAL LOW (ref 26.0–34.0)
MCHC: 24.1 g/dL — ABNORMAL LOW (ref 30.0–36.0)
MCV: 95.4 fL (ref 80.0–100.0)
Monocytes Absolute: 1.2 10*3/uL — ABNORMAL HIGH (ref 0.1–1.0)
Monocytes Relative: 8 %
Neutro Abs: 11.7 10*3/uL — ABNORMAL HIGH (ref 1.7–7.7)
Neutrophils Relative %: 81 %
Platelets: 354 10*3/uL (ref 150–400)
RBC: 3.88 MIL/uL — ABNORMAL LOW (ref 4.22–5.81)
RDW: 16 % — ABNORMAL HIGH (ref 11.5–15.5)
WBC: 14.5 10*3/uL — ABNORMAL HIGH (ref 4.0–10.5)
nRBC: 0 % (ref 0.0–0.2)

## 2020-03-05 LAB — RESP PANEL BY RT-PCR (FLU A&B, COVID) ARPGX2
Influenza A by PCR: NEGATIVE
Influenza B by PCR: NEGATIVE
SARS Coronavirus 2 by RT PCR: NEGATIVE

## 2020-03-05 LAB — BLOOD GAS, VENOUS
FIO2: 40
O2 Saturation: 89.4 %
Patient temperature: 35
pCO2, Ven: >120 mmHg (ref 44.0–60.0)
pH, Ven: 7.159 — CL (ref 7.250–7.430)
pO2, Ven: 68.4 mmHg — ABNORMAL HIGH (ref 32.0–45.0)

## 2020-03-05 LAB — COMPREHENSIVE METABOLIC PANEL
ALT: 7 U/L (ref 0–44)
AST: 15 U/L (ref 15–41)
Albumin: 3.2 g/dL — ABNORMAL LOW (ref 3.5–5.0)
Alkaline Phosphatase: 66 U/L (ref 38–126)
Anion gap: 4 — ABNORMAL LOW (ref 5–15)
BUN: 21 mg/dL (ref 8–23)
CO2: 42 mmol/L — ABNORMAL HIGH (ref 22–32)
Calcium: 9.2 mg/dL (ref 8.9–10.3)
Chloride: 97 mmol/L — ABNORMAL LOW (ref 98–111)
Creatinine, Ser: 0.81 mg/dL (ref 0.61–1.24)
GFR, Estimated: >60 mL/min (ref >60)
Glucose, Bld: 169 mg/dL — ABNORMAL HIGH (ref 70–99)
Potassium: 5.1 mmol/L (ref 3.5–5.1)
Sodium: 143 mmol/L (ref 135–145)
Total Bilirubin: 0.9 mg/dL (ref 0.3–1.2)
Total Protein: 7.2 g/dL (ref 6.5–8.1)

## 2020-03-05 LAB — URINALYSIS, ROUTINE W REFLEX MICROSCOPIC
Bilirubin Urine: NEGATIVE
Glucose, UA: NEGATIVE mg/dL
Hgb urine dipstick: NEGATIVE
Ketones, ur: NEGATIVE mg/dL
Leukocytes,Ua: NEGATIVE
Nitrite: NEGATIVE
Protein, ur: 100 mg/dL — AB
Specific Gravity, Urine: 1.016 (ref 1.005–1.030)
pH: 5 (ref 5.0–8.0)

## 2020-03-05 LAB — PROTIME-INR
INR: 1.3 — ABNORMAL HIGH (ref 0.8–1.2)
Prothrombin Time: 15.8 seconds — ABNORMAL HIGH (ref 11.4–15.2)

## 2020-03-05 LAB — BRAIN NATRIURETIC PEPTIDE: B Natriuretic Peptide: 190 pg/mL — ABNORMAL HIGH (ref 0.0–100.0)

## 2020-03-05 LAB — TROPONIN I (HIGH SENSITIVITY)
Troponin I (High Sensitivity): 19 ng/L — ABNORMAL HIGH (ref <18)
Troponin I (High Sensitivity): 20 ng/L — ABNORMAL HIGH (ref <18)

## 2020-03-05 LAB — LACTIC ACID, PLASMA
Lactic Acid, Venous: 0.9 mmol/L (ref 0.5–1.9)
Lactic Acid, Venous: 1.6 mmol/L (ref 0.5–1.9)

## 2020-03-05 LAB — APTT: aPTT: 34 seconds (ref 24–36)

## 2020-03-05 MED ORDER — SODIUM CHLORIDE 0.9 % IV SOLN
2.0000 g | Freq: Once | INTRAVENOUS | Status: AC
  Administered 2020-03-05: 2 g via INTRAVENOUS
  Filled 2020-03-05: qty 20

## 2020-03-05 MED ORDER — PROPOFOL 1000 MG/100ML IV EMUL
INTRAVENOUS | Status: AC
  Administered 2020-03-05: 5 ug/kg/min via INTRAVENOUS
  Filled 2020-03-05: qty 100

## 2020-03-05 MED ORDER — MIDAZOLAM HCL 2 MG/2ML IJ SOLN
4.0000 mg | INTRAMUSCULAR | Status: DC | PRN
  Administered 2020-03-05 – 2020-03-06 (×2): 4 mg via INTRAVENOUS
  Filled 2020-03-05 (×2): qty 4

## 2020-03-05 MED ORDER — NALOXONE HCL 0.4 MG/ML IJ SOLN
INTRAMUSCULAR | Status: AC
  Administered 2020-03-05: 0.4 mg
  Filled 2020-03-05: qty 2

## 2020-03-05 MED ORDER — VANCOMYCIN HCL 1250 MG/250ML IV SOLN
1250.0000 mg | Freq: Two times a day (BID) | INTRAVENOUS | Status: DC
  Administered 2020-03-05 – 2020-03-08 (×6): 1250 mg via INTRAVENOUS
  Filled 2020-03-05 (×11): qty 250

## 2020-03-05 MED ORDER — IPRATROPIUM-ALBUTEROL 0.5-2.5 (3) MG/3ML IN SOLN: 3.0000 mL | Freq: Once | RESPIRATORY_TRACT | Status: DC

## 2020-03-05 MED ORDER — PROPOFOL 1000 MG/100ML IV EMUL: 5.0000 ug/kg/min | INTRAVENOUS | Status: DC

## 2020-03-05 MED ORDER — PROPOFOL 1000 MG/100ML IV EMUL
5.0000 ug/kg/min | INTRAVENOUS | Status: DC
  Administered 2020-03-06: 10 ug/kg/min via INTRAVENOUS
  Administered 2020-03-06 (×2): 20 ug/kg/min via INTRAVENOUS
  Administered 2020-03-06: 22.5 ug/kg/min via INTRAVENOUS
  Administered 2020-03-07: 15 ug/kg/min via INTRAVENOUS
  Administered 2020-03-07: 20.978 ug/kg/min via INTRAVENOUS
  Administered 2020-03-07: 10 ug/kg/min via INTRAVENOUS
  Administered 2020-03-08: 5 ug/kg/min via INTRAVENOUS
  Filled 2020-03-05 (×8): qty 100

## 2020-03-05 MED ORDER — FENTANYL 2500MCG IN NS 250ML (10MCG/ML) PREMIX INFUSION
0.0000 ug/h | INTRAVENOUS | Status: DC
  Administered 2020-03-05: 25 ug/h via INTRAVENOUS
  Filled 2020-03-05 (×2): qty 250

## 2020-03-05 MED ORDER — LACTATED RINGERS IV SOLN: INTRAVENOUS | Status: DC

## 2020-03-05 MED ORDER — PROPOFOL 1000 MG/100ML IV EMUL
INTRAVENOUS | Status: AC
  Filled 2020-03-05: qty 100

## 2020-03-05 MED ORDER — FUROSEMIDE 10 MG/ML IJ SOLN
40.0000 mg | Freq: Two times a day (BID) | INTRAMUSCULAR | Status: DC
  Administered 2020-03-05 – 2020-03-10 (×10): 40 mg via INTRAVENOUS
  Filled 2020-03-05 (×10): qty 4

## 2020-03-05 MED ORDER — SODIUM CHLORIDE 0.9 % IV SOLN
2.0000 g | Freq: Three times a day (TID) | INTRAVENOUS | Status: DC
  Administered 2020-03-06 – 2020-03-08 (×8): 2 g via INTRAVENOUS
  Filled 2020-03-05 (×6): qty 2

## 2020-03-05 NOTE — ED Triage Notes (Addendum)
Pt from home via EMS for lethargy for unknown time. Per EMS pt has been sinus brady and CBG 152. Pt reports nausea but no vomiting. Denies pain. PT 94% on 15L NRB. 75% on RA. Pt repeatedly pulling mask off of face. Pt nonambulatory at baseline,

## 2020-03-05 NOTE — ED Provider Notes (Signed)
[facility] EMERGENCY DEPARTMENT Provider Note   CSN: [id number] Arrival date & time: 03/05/20  1818     History CC. Respiratory distress  Jonathan Wise is a 74 y.o. male a/ hx of a fib on pradaxa, DVT, morbid obesity, DM2, HTN, HLD, urosepsis, presenting from home with lethargy and somnolence.  Patient is unable to provide a history on arrival as he is obtunded.  EMS reports were called out to the house per the patient's wife, who provide any supplemental history.  She says that he has had increasing lethargy for the past 2 to 3 days.  She noted that his breathing seemed to be more labored in the past 24 hours, and he was hardly interacting with her at home.    EMS reports his O2 sat was 70% on their arrival, no sig improvement with NRB mask.  She tells me is on 3 L nasal cannula at all times at home.  She cannot tell me what medications he has been taking.  Medical record review, the patient has had prior hospitalizations for congestive heart failure flareups, as well as COPD.  The prolonged discussion with the patient's wife, it is unclear what his final CODE STATUS is.  She does confirm that he is DNR, and this is also seen on his most recent hospitalization code status update.  However she cannot confirm his DNI status.  At different times it appears he has told her that he would or would not want to be on a breathing machine if it meant saving his life.  He does not have a signed advanced directive available at this time.    HPI     Past Medical History:  Diagnosis Date  . Atrial fibrillation (HCC)    On Pradaxa  . Bilateral lower extremity edema   . Cellulitis   . DVT (deep venous thrombosis) (HCC) 1994   LLE. Completed tx with coumadin  . Hyperlipidemia   . Hypertension   . Kidney stones   . Morbid obesity (HCC)   . Pressure ulcer   . Sleep apnea   . Type 2 diabetes mellitus (HCC)   . Urinary retention 10/15/2011  . Venous stasis ulcers [facility])     Patient Active  Problem List   Diagnosis Date Noted  . Acute respiratory failure with hypercapnia (HCC) 03/05/2020  . SIRS (systemic inflammatory response syndrome) (HCC) 03/05/2020  . Elevated brain natriuretic peptide (BNP) level 03/05/2020  . Acute on chronic respiratory failure with hypoxia and hypercapnia (HCC) 03/05/2020  . UTI (urinary tract infection) 12/04/2019  . Acute on chronic diastolic CHF (congestive heart failure) (HCC) 12/04/2019  . Elevated troponin 12/04/2019  . Goals of care, counseling/discussion   . Palliative care by specialist   . Severe sepsis with septic shock (HCC) 10/24/2019  . Sepsis secondary to UTI (HCC) 10/24/2019  . Lobar pneumonia (HCC) 10/24/2019  . DVT (deep venous thrombosis) (HCC) 10/24/2019  . Severe sepsis (HCC) 10/19/2019  . Sepsis (HCC) 10/19/2019  . Pressure injury of skin 06/19/2019  . 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 Bedbound 05/14/2019  . Former heavy tobacco smoker 05/14/2019  . Atrial fibrillation with RVR (HCC) 05/13/2019  . Chronic osteoarthritis 01/01/2017  . Lower extremity weakness 01/01/2017  . Acute respiratory failure with hypoxia (HCC) 12/28/2016  . Anemia of chronic disease 12/25/2015  . Candidal intertrigo 12/22/2015  . 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 Bradycardia 03/15/2014  . Lactic acidosis 03/15/2014  . Atrial fibrillation (HCC) 06/23/2012  .  PVC's (premature ventricular contractions) 06/23/2012  . Cellulitis of leg, left 06/22/2012  . Sepsis due to pneumonia (HCC) 06/22/2012  . Hyperglycemia due to diabetes mellitus (HCC) 06/22/2012  . Morbid obesity (HCC) 06/22/2012  . Osteoarthritis of right knee 06/22/2012  . Current use of long term anticoagulation 06/22/2012  . Pulmonary artery hypertension (HCC) 10/18/2011  . Bilateral lower extremity edema 10/14/2011  . Hypotension 10/14/2011  . Venous stasis ulcers (HCC) 10/14/2011  . Type 2 diabetes mellitus without complication (HCC) 10/14/2011     Past Surgical History:  Procedure Laterality Date  . CYSTOSCOPY  10/18/2011   Procedure: CYSTOSCOPY FLEXIBLE;  Surgeon: Ky Barban, MD;  Location: [facility];  Service: Urology;  Laterality: N/A;  . HERNIA REPAIR     Umbilical       Family History  Problem Relation Age of Onset  . Diabetes Mother   . Emphysema Father     Social History   Tobacco Use  . Smoking status: Former Smoker    Packs/day: 3.00    Years: 60.00    Pack years: 180.00    Types: Cigarettes    Quit date: 2006    Years since quitting: 15.9  . Smokeless tobacco: Never Used  Vaping Use  . Vaping Use: Never used  Substance Use Topics  . Alcohol use: No    Alcohol/week: 0.0 standard drinks  . Drug use: No    Home Medications Prior to Admission medications   Medication Sig Start Date End Date Taking? Authorizing Provider  apixaban (ELIQUIS) 5 MG TABS tablet Take 1 tablet (5 mg total) by mouth 2 (two) times daily. 07/22/17   Darlin Drop, DO  digoxin (LANOXIN) 0.125 MG tablet Take 1 tablet by mouth daily at 8 pm. 11/13/19   [provider]  furosemide (LASIX) 40 MG tablet Take 1 tablet (40 mg total) by mouth daily. 12/07/19 01/06/20  Glade Lloyd, MD  hydrocerin (EUCERIN) CREA Apply 1 application topically 2 (two) times daily. 05/19/19   Erick Blinks, MD  lactulose (CHRONULAC) 10 GM/15ML solution Take 15 mLs (10 g total) by mouth 2 (two) times daily. Patient not taking: Reported on 12/31/2019 12/07/19   Glade Lloyd, MD  metFORMIN (GLUCOPHAGE) 500 MG tablet Take 500 mg by mouth 2 (two) times daily with a meal.    [provider]  metoprolol tartrate (LOPRESSOR) 25 MG tablet Take 0.5 tablets (12.5 mg total) by mouth 2 (two) times daily. 11/03/19   Burnadette Pop, MD  polyethylene glycol (MIRALAX / GLYCOLAX) 17 g packet Take 17 g by mouth daily as needed for moderate constipation. Patient not taking: Reported on 12/31/2019 11/03/19   Burnadette Pop, MD  pravastatin (PRAVACHOL) 40 MG  tablet Take 40 mg by mouth daily.    [provider]  tamsulosin (FLOMAX) 0.4 MG CAPS capsule Take 1 capsule (0.4 mg total) by mouth daily. 11/03/19   Burnadette Pop, MD  TRESIBA FLEXTOUCH 100 UNIT/ML FlexTouch Pen Inject 26 Units into the skin daily. 10/03/19   [provider]  zinc sulfate 220 (50 Zn) MG capsule Take by mouth.    [provider]    Allergies    Patient has no known allergies.  Review of Systems   Review of Systems  Unable to perform ROS: Acuity of condition (level 5 caveat, obtunded)    Physical Exam Updated Vital Signs BP (!) 227/196   Pulse 60   Temp 98.1 F (36.7 C)   Resp (!) 25   Ht 6'  1" (1.854 m)   Wt 129.5 kg   SpO2 100%   BMI 37.67 kg/m   Physical Exam Constitutional:      Appearance: He is obese.     Comments: Somnolent  Eyes:     Pupils: Pupils are equal, round, and reactive to light.  Cardiovascular:     Rate and Rhythm: Regular rhythm. Tachycardia present.  Pulmonary:     Breath sounds: Wheezing and rhonchi present.     Comments: 100% on NRB RR 12 Abdominal:     Comments: Obese  Neurological:     Comments: GCS Eyes 2, Verbal 3, Motor 5 = 10     ED Results / Procedures / Treatments   Labs (all labs ordered are listed, but only abnormal results are displayed) Labs Reviewed  COMPREHENSIVE METABOLIC PANEL - Abnormal; Notable for the following components:      Result Value   Chloride 97 (*)    CO2 42 (*)    Glucose, Bld 169 (*)    Albumin 3.2 (*)    Anion gap 4 (*)    All other components within normal limits  CBC WITH DIFFERENTIAL/PLATELET - Abnormal; Notable for the following components:   WBC 14.5 (*)    RBC 3.88 (*)    Hemoglobin 8.9 (*)    HCT 37.0 (*)    MCH 22.9 (*)    MCHC 24.1 (*)    RDW 16.0 (*)    Neutro Abs 11.7 (*)    Monocytes Absolute 1.2 (*)    Abs Immature Granulocytes 0.10 (*)    All other components within normal limits  PROTIME-INR - Abnormal; Notable for the following  components:   Prothrombin Time 15.8 (*)    INR 1.3 (*)    All other components within normal limits  URINALYSIS, ROUTINE W REFLEX MICROSCOPIC - Abnormal; Notable for the following components:   APPearance CLOUDY (*)    Protein, ur 100 (*)    Bacteria, UA RARE (*)    All other components within normal limits  BLOOD GAS, VENOUS - Abnormal; Notable for the following components:   pH, Ven 7.159 (*)    pCO2, Ven >120 (*)    pO2, Ven 68.4 (*)    All other components within normal limits  BRAIN NATRIURETIC PEPTIDE - Abnormal; Notable for the following components:   B Natriuretic Peptide 190.0 (*)    All other components within normal limits  BLOOD GAS, ARTERIAL - Abnormal; Notable for the following components:   pH, Arterial 7.572 (*)    pO2, Arterial 308 (*)    Bicarbonate 37.7 (*)    Acid-Base Excess 14.0 (*)    All other components within normal limits  GLUCOSE, CAPILLARY - Abnormal; Notable for the following components:   Glucose-Capillary 60 (*)    All other components within normal limits  TROPONIN I (HIGH SENSITIVITY) - Abnormal; Notable for the following components:   Troponin I (High Sensitivity) 19 (*)    All other components within normal limits  TROPONIN I (HIGH SENSITIVITY) - Abnormal; Notable for the following components:   Troponin I (High Sensitivity) 20 (*)    All other components within normal limits  CULTURE, BLOOD (SINGLE)  RESP PANEL BY RT-PCR (FLU A&B, COVID) [id number]  URINE CULTURE  MRSA PCR SCREENING  LACTIC ACID, PLASMA  LACTIC ACID, PLASMA  APTT  GLUCOSE, CAPILLARY  COMPREHENSIVE METABOLIC PANEL  CBC  PROTIME-INR  APTT  MAGNESIUM  PHOSPHORUS  PROCALCITONIN  PROCALCITONIN  TRIGLYCERIDES  BLOOD GAS,  VENOUS  BLOOD GAS, ARTERIAL  HEMOGLOBIN A1C    EKG EKG Interpretation  Date/Time:  Friday March 05 2020 18:47:32 EST Ventricular Rate:  126 PR Interval:    QRS Duration: 92 QT Interval:  266 QTC Calculation: 385 R Axis:   94 Text  Interpretation: Ectopic atrial tachycardia, unifocal Right axis deviation Nonspecific repol abnormality, diffuse leads NO STEMI Confirmed by Alvester Chou [phone]) on 03/05/2020 6:53:21 PM   Radiology DG Chest Portable 1 View  Result Date: 03/05/2020 CLINICAL DATA:  NG tube placement EXAM: PORTABLE CHEST 1 VIEW COMPARISON:  03/05/2020 FINDINGS: Endotracheal tube tip is about 3 cm superior to the carina. Esophageal tube looped over the lower mediastinum with the tip projecting over the right cardiac margin, probably within large hiatal hernia. Moderate pleural effusions and basilar consolidations. IMPRESSION: Endotracheal tube tip about 3 cm superior to the carina. Esophageal tube looped over the lower mediastinum with the tip projecting over the right cardiac margin, probably within large hiatal hernia. Continued pleural effusions and basilar consolidation Electronically Signed   By: Jasmine Pang M.D.   On: 03/05/2020 23:12   DG Chest Portable 1 View  Result Date: 03/05/2020 CLINICAL DATA:  Orogastric tube placement. EXAM: PORTABLE CHEST 1 VIEW COMPARISON:  March 05, 2020 FINDINGS: There is stable endotracheal tube positioning. A nasogastric tube is seen with its distal end looped upon itself within the retrocardiac region of the lung bases. Small to moderate sized bilateral pleural effusions are seen. No pneumothorax is identified. The cardiac silhouette is mildly enlarged. The visualized skeletal structures are unremarkable. IMPRESSION: 1. Small to moderate sized bilateral pleural effusions. 2. Nasogastric tube positioning, as described above. This may be within a large gastric/hiatal hernia. Electronically Signed   By: Aram Candela M.D.   On: 03/05/2020 21:50   DG Chest Portable 1 View  Result Date: 03/05/2020 CLINICAL DATA:  Intubated EXAM: PORTABLE CHEST 1 VIEW COMPARISON:  03/05/2020, 12/03/2019 FINDINGS: Endotracheal tube tip is about 2.8 cm superior to the carina. Esophageal tube  tip appears looped back upon itself in the region of GE junction with the tip directed cranial, likely over the distal esophagus. Cardiomegaly with vascular congestion, pulmonary edema, moderate pleural effusions and basilar consolidations without change. IMPRESSION: 1. Endotracheal tube tip about 2.8 cm superior to the carina. 2. Esophageal tube tip appears looped back upon itself in the region of GE junction with the tip directed cranial, likely over the distal esophagus. 3. No change in cardiomegaly, pulmonary edema, and pleural effusions. These results will be called to the ordering clinician or representative by the Radiologist Assistant, and communication documented in the PACS or [employer]. Electronically Signed   By: Jasmine Pang M.D.   On: 03/05/2020 20:39   DG Chest Port 1 View  Result Date: 03/05/2020 CLINICAL DATA:  74 year old male with concern for sepsis. EXAM: PORTABLE CHEST 1 VIEW COMPARISON:  Chest radiograph dated 12/03/2019 FINDINGS: Mild cardiomegaly with vascular congestion and edema. Small bilateral pleural effusions with bibasilar atelectasis. Pneumonia is not excluded clinical correlation is recommended. No pneumothorax. Atherosclerotic calcification of the aorta. Degenerative changes of the spine. No acute osseous pathology. IMPRESSION: Cardiomegaly with findings of CHF and small bilateral pleural effusions. Bibasilar atelectasis versus infiltrate. Electronically Signed   By: Elgie Collard M.D.   On: 03/05/2020 19:28    Procedures .Critical Care Performed by: Terald Sleeper, MD Authorized by: Terald Sleeper, MD   Critical care provider statement:    Critical care time (minutes):  45  Critical care was necessary to treat or prevent imminent or life-threatening deterioration of the following conditions:  Respiratory failure   Critical care was time spent personally by me on the following activities:  Discussions with consultants, evaluation of patient's  response to treatment, examination of patient, ordering and performing treatments and interventions, ordering and review of laboratory studies, ordering and review of radiographic studies, pulse oximetry, re-evaluation of patient's condition, obtaining history from patient or surrogate and review of old charts Procedure Name: Intubation Date/Time: 03/05/2020 11:38 PM Performed by: Terald Sleeper, MD Pre-anesthesia Checklist: Patient identified, Patient being monitored, Emergency Drugs available, Timeout performed and Suction available Oxygen Delivery Method: Non-rebreather mask Preoxygenation: Pre-oxygenation with 100% oxygen Induction Type: Rapid sequence Ventilation: Mask ventilation without difficulty Laryngoscope Size: Glidescope and 3 Tube size: 7.5 mm Number of attempts: 1 Airway Equipment and Method: Video-laryngoscopy Placement Confirmation: ETT inserted through vocal cords under direct vision,  CO2 detector and Breath sounds checked- equal and bilateral Secured at: 24 cm Tube secured with: ETT holder      (including critical care time)  Medications Ordered in ED Medications  ipratropium-albuterol (DUONEB) 0.5-2.5 (3) MG/3ML nebulizer solution 3 mL (3 mLs Nebulization Not Given 03/05/20 2009)  midazolam (VERSED) injection 4 mg (4 mg Intravenous Given 03/06/20 0019)  vancomycin (VANCOREADY) IVPB 1250 mg/250 mL (0 mg Intravenous Stopped 03/05/20 2238)  fentaNYL in NS (17mcg/ml) infusion-PREMIX (150 mcg/hr Intravenous Rate/Dose Change 03/05/20 2245)  furosemide (LASIX) injection 40 mg (40 mg Intravenous Given 03/05/20 2337)  ceFEPIme (MAXIPIME) 2 g in sodium chloride 0.9 % 100 mL IVPB (has no administration in time range)  propofol (DIPRIVAN) 1000 MG/100ML infusion (10 mcg/kg/min  129.5 kg Intravenous New Bag/Given 03/06/20 0025)  apixaban (ELIQUIS) tablet 5 mg (5 mg Oral Not Given 03/06/20 0018)  insulin aspart (novoLOG) injection 0-9 Units (0 Units  Subcutaneous Not Given 03/06/20 0018)  propofol (DIPRIVAN) 1000 MG/100ML infusion (has no administration in time range)  naloxone (NARCAN) 0.4 MG/ML injection (0.4 mg  Given 03/05/20 1840)  cefTRIAXone (ROCEPHIN) 2 g in sodium chloride 0.9 % 100 mL IVPB (0 g Intravenous Stopped 03/05/20 2104)    ED Course  I have reviewed the triage vital signs and the nursing notes.  Pertinent labs & imaging results that were available during my care of the patient were reviewed by me and considered in my medical decision making (see chart for details).  73-year male present emergency department with hypoxia and somnolence.  The differential on arrival was broad but included congestive heart failure exacerbation versus COPD exacerbation with hypercapnic respiratory failure (most likely given his body habitus) vs. metabolic encephalopathy versus other.  Initially transitioned to bipap on arrival to attempt to improve ventilation, and discuss goals of care with his family members.  VBG subsequently returned with acidosis and pCO2 > 120.  At this point I explained to his wife that I felt there was a significant chance he may die without intubation and ventilation.  I had discussions with his wife and then his granddaughter by phone, as there was ongoing lack of clarity among the family regarding whether he was DNI.  Ultimately we decided to proceed with intubation now in an effort to make stable his critical situation.  He was intubated with roc + etomidate and glidescope with 7.5 ETT cuffed tube.    Labs reviewed personally with WBC 14.5, VBG as noted, trop mildly elevated, BNP 190.  Flu/covid PCR negative.  DG chest reviewed with ETT in correct placement  above carina, OG tube coiled in distal esophagus.  Nursing to attempt replacement of OG tube.   Clinical Course as of Mar 06 42  Fri Mar 05, 2020  1916 Patient was satting 100% on NRB, however had significant somnolence on exam, and my concern was for  hypercapneic resp failure or narcosis.  We've placed him on bipap for now.  He is awaiting a VBG and additional testing.     [MT]  1917 His wife has arrived and reports he was "like this" for 2 days.  She is a poor historian.  When asked about code status, she recalls he is DNR ("no CPR or anything") but is unaware of his DNI status.     [MT]  1942 Pt intubated with 7.5 tube, RR at 26, 8 cc/kg IBW, PRVC setting. Will need repeat ABG in 30 minutes.  Wife updated.   [MT]  2045 Signed out to hospitalist for ICU admission, sedated and intubated currently, agreed for vanco + ceftriaxone coverage empirically for now.  With lactate of 0.9 this seems less likely sepsis - okay for LR infusion, but I would hold off on large fluid boluses given his CHF and pulm edema at this time   [MT]    Clinical Course User Index [MT] Chidi Shirer, Kermit Balo, MD    Final Clinical Impression(s) / ED Diagnoses Final diagnoses:  Dyspnea    Rx / DC Orders ED Discharge Orders    None       Terald Sleeper, MD 03/06/20 [phone]

## 2020-03-05 NOTE — Progress Notes (Signed)
Pharmacy Antibiotic Note  Jonathan Wise is a 74 y.o. male admitted on 03/05/2020 with lethargic and septic. Pharmacy has been consulted for vancomycin dosing. WBC elevated, lactic acid normal.  Plan: Vancomycin 1250mg  q12h Ceftriaxone per MD Follow LOT, Cr, cultures  Height: 6\' 1"  (185.4 cm) Weight: 129.5 kg (285 lb 7.9 oz) IBW/kg (Calculated) : 79.9  No data recorded.  Recent Labs  Lab 03/05/20 1844  WBC 14.5*  CREATININE 0.81  LATICACIDVEN 0.9    Estimated Creatinine Clearance: 112.8 mL/min (by C-G formula based on SCr of 0.81 mg/dL).    No Known Allergies  Antimicrobials this admission: Vancomycin 11/26>> Ceftriaxone 11/26>>  Microbiology results: pending  Thank you for allowing pharmacy to be a part of this patient's care.  03/07/20, PharmD, BCPS, [facility] Clinical Pharmacist Please check AMION for all [facility] numbers 03/05/2020

## 2020-03-05 NOTE — ED Notes (Signed)
Date and time results received: 03/05/20 1919 (use smartphrase ".now" to insert current time)  Test: Venous pH/ Venous pCO2 Critical Value: 7.159/ >120  Name of Provider Notified: Trifan  Orders Received? Or Actions Taken?:

## 2020-03-05 NOTE — ED Notes (Signed)
30 mg etomidate and 100 rocuronium given.

## 2020-03-05 NOTE — Progress Notes (Signed)
eLink Physician-Brief Progress Note Patient Name: Jonathan Wise DOB: 07/25/1945 MRN: [id number]   Date of Service  03/05/2020  HPI/Events of Note  40M admitted with decompensated heart failure c/b acute hypoxemic and hypercapnic respiratory failure (w/ bilateral pleural effusions and vascular congestion on CXR, cannot rule out right-sided pneumonic infiltrate). Patient required intubation after ABG of 7.16/>120/68. Post-intubation ABG was 7.57/40/308/37/BE +14. This was on RR 26-28/min. Patient is now awake and biting ETT so needs sedation. Mildly hypertensive. NSR in 60s. No fever. Only a mild leukocytosis that is most likely reactive in nature but cannot rule out pneumonia as above.   eICU Interventions  - Decrease vent set rate to 20/min. VBG in 1 hour to ensure appropriateness of settings. May need further reduction in RR. - Lasix being given for volume overload. - Start propofol (in addition to fentanyl) for sedation/anagelsia. - Monitor UOP. Goal net negative 1-2L by AM. - Vanc/cefepime for coverage of PNA, although if cultures remain negative consider early de-escalation.     Intervention Category Evaluation Type: New Patient Evaluation  Jonathan Wise 03/05/2020, 11:36 PM

## 2020-03-05 NOTE — Progress Notes (Signed)
Pharmacy Antibiotic Note  Jonathan Wise is a 74 y.o. male admitted on 03/05/2020 with sepsis.  Pharmacy has been consulted to add cefepime to ABX regimen.  Plan: Cefepime 2g IV Q8H.  Height: 6\' 1"  (185.4 cm) Weight: 129.5 kg (285 lb 7.9 oz) IBW/kg (Calculated) : 79.9  Temp (24hrs), Avg:96.7 F (35.9 C), Min:94.8 F (34.9 C), Max:98.1 F (36.7 C)  Recent Labs  Lab 03/05/20 1844 03/05/20 2044  WBC 14.5*  --   CREATININE 0.81  --   LATICACIDVEN 0.9 1.6    Estimated Creatinine Clearance: 112.8 mL/min (by C-G formula based on SCr of 0.81 mg/dL).    No Known Allergies   Thank you for allowing pharmacy to be a part of this patient's care.  2045, PharmD, BCPS  03/05/2020 11:37 PM

## 2020-03-05 NOTE — H&P (Signed)
History and Physical  Jonathan Wise GMW:[id number] DOB: Nov 02, 1945 DOA: 03/05/2020  Referring physician: Renaye Rakers  PCP: [name], MD  Patient coming from: Home  Chief Complaint: Lethargy and somnolence  HPI: Jonathan Wise is a 74 y.o. male with medical history significant for paroxysmal A. fib on Eliquis, chronic diastolic CHF, chronic respiratory failure on 3 L home oxygen, DVT, hypertension, hyperlipidemia, obesity (BMI 36.94), sleep apnea, insulin-dependent type 2 diabetes, venous stasis ulcers presenting to the ED via EMS for evaluation of lethargy and somnolence.  Patient was unable to provide history due to being intubated and sedated.  History was obtained from ED physician, per report, patient presented with 3-day onset of generalized weakness and within last 24 hours, he was more somnolent and not talking, so EMS was activated, on arrival of EMS patient was noted to have O2 sat in the 70s, patient was noted to be obtunded on arrival to the ED.  ED Course:  In the emergency department, she was noted to be hypothermic with a temperature of 95.52F, tachypneic, tachycardic, BP 180/163 and was on BiPAP with O2 sat of 97%.  Work-up in the ED showed leukocytosis, hyperglycemia, BNP 190, respiratory panel for influenza A, B and SARS coronavirus 2 was negative, troponin I 19 > 20, VBG showed pH 7.159, PCO2 > 120, PO2 68.4, O2 sat 89.4% at FiO2 of 40%.  Lactic acid was within normal range and urinalysis was unimpressive for UTI. Chest x-ray showed cardiomegaly with findings of CHF and small bilateral pleural effusions.  Bibasilar atelectasis versus infiltrate noted. Per ED physician, family was unable to determine patient's CODE STATUS and due to patient's obtunded state, he was intubated pending family's decision. He was treated with IV Vanco and ceftriaxone, he was initially started on IV propofol for sedation, but this was changed to IV fentanyl due to hypotension.  Hospitalist was asked to  admit patient for further evaluation and management.  Review of Systems: This cannot be obtained at this time due to patient being intubated and sedated   Past Medical History:  Diagnosis Date  . Atrial fibrillation (HCC)    On Pradaxa  . Bilateral lower extremity edema   . Cellulitis   . DVT (deep venous thrombosis) (HCC) 1994   LLE. Completed tx with coumadin  . Hyperlipidemia   . Hypertension   . Kidney stones   . Morbid obesity (HCC)   . Pressure ulcer   . Sleep apnea   . Type 2 diabetes mellitus (HCC)   . Urinary retention 10/15/2011  . Venous stasis ulcers (HCC)    Past Surgical History:  Procedure Laterality Date  . CYSTOSCOPY  10/18/2011   Procedure: CYSTOSCOPY FLEXIBLE;  Surgeon: Ky Barban, MD;  Location: [facility];  Service: Urology;  Laterality: N/A;  . HERNIA REPAIR     Umbilical    Social History:  reports that he quit smoking about 15 years ago. His smoking use included cigarettes. He has a 180.00 pack-year smoking history. He has never used smokeless tobacco. He reports that he does not drink alcohol and does not use drugs.   No Known Allergies  Family History  Problem Relation Age of Onset  . Diabetes Mother   . Emphysema Father      Prior to Admission medications   Medication Sig Start Date End Date Taking? Authorizing Provider  apixaban (ELIQUIS) 5 MG TABS tablet Take 1 tablet (5 mg total) by mouth 2 (two) times daily. 07/22/17   Dow Adolph  N, DO  digoxin (LANOXIN) 0.125 MG tablet Take 1 tablet by mouth daily at 8 pm. 11/13/19   [provider]  furosemide (LASIX) 40 MG tablet Take 1 tablet (40 mg total) by mouth daily. 12/07/19 01/06/20  Glade Lloyd, MD  hydrocerin (EUCERIN) CREA Apply 1 application topically 2 (two) times daily. 05/19/19   Erick Blinks, MD  lactulose (CHRONULAC) 10 GM/15ML solution Take 15 mLs (10 g total) by mouth 2 (two) times daily. Patient not taking: Reported on 12/31/2019 12/07/19   Glade Lloyd, MD  metFORMIN  (GLUCOPHAGE) 500 MG tablet Take 500 mg by mouth 2 (two) times daily with a meal.    [provider]  metoprolol tartrate (LOPRESSOR) 25 MG tablet Take 0.5 tablets (12.5 mg total) by mouth 2 (two) times daily. 11/03/19   Burnadette Pop, MD  polyethylene glycol (MIRALAX / GLYCOLAX) 17 g packet Take 17 g by mouth daily as needed for moderate constipation. Patient not taking: Reported on 12/31/2019 11/03/19   Burnadette Pop, MD  pravastatin (PRAVACHOL) 40 MG tablet Take 40 mg by mouth daily.    [provider]  tamsulosin (FLOMAX) 0.4 MG CAPS capsule Take 1 capsule (0.4 mg total) by mouth daily. 11/03/19   Burnadette Pop, MD  TRESIBA FLEXTOUCH 100 UNIT/ML FlexTouch Pen Inject 26 Units into the skin daily. 10/03/19   [provider]    Physical Exam: BP (!) 227/196   Pulse 60   Temp 98.1 F (36.7 C)   Resp (!) 24   Ht 6\' 1"  (1.854 m)   Wt 129.5 kg   SpO2 100%   BMI 37.67 kg/m   . General: 74 y.o. year-old male well developed well nourished in no acute distress.  Alert and oriented x3. 66 HEENT: NCAT, PERRL . Neck: Supple, trachea medial . Cardiovascular: Regular rate and rhythm with no rubs or gallops.  No thyromegaly or JVD noted.  2/4 pulses in all 4 extremities. Marland Kitchen Respiratory: Mild rales bilaterally in lower lobes. No wheezes  . Abdomen: Soft nontender nondistended with normal bowel sounds x4 quadrants. . Muskuloskeletal: Lateral extremity edema to midshin bilaterally.  No cyanosis or clubbing Neuro: This cannot be evaluated at this time due to patient being intubated and sedated . Skin: No ulcerative lesions noted or rashes . Psychiatry: This cannot be evaluated at this time due to patient being intubated and sedated         Labs on Admission:  Basic Metabolic Panel: Recent Labs  Lab 03/05/20 1844  NA 143  K 5.1  CL 97*  CO2 42*  GLUCOSE 169*  BUN 21  CREATININE 0.81  CALCIUM 9.2   Liver Function Tests: Recent Labs  Lab 03/05/20 1844  AST 15   ALT 7  ALKPHOS 66  BILITOT 0.9  PROT 7.2  ALBUMIN 3.2*   No results for input(s): LIPASE, AMYLASE in the last 168 hours. No results for input(s): AMMONIA in the last 168 hours. CBC: Recent Labs  Lab 03/05/20 1844  WBC 14.5*  NEUTROABS 11.7*  HGB 8.9*  HCT 37.0*  MCV 95.4  PLT 354   Cardiac Enzymes: No results for input(s): CKTOTAL, CKMB, CKMBINDEX, TROPONINI in the last 168 hours.  BNP (last 3 results) Recent Labs    06/02/19 1645 12/03/19 1947 03/05/20 1844  BNP 241.0* 178.0* 190.0*    ProBNP (last 3 results) No results for input(s): PROBNP in the last 8760 hours.  CBG: No results for input(s): GLUCAP in the last 168 hours.  Radiological Exams  on Admission: DG Chest Portable 1 View  Result Date: 03/05/2020 CLINICAL DATA:  Orogastric tube placement. EXAM: PORTABLE CHEST 1 VIEW COMPARISON:  March 05, 2020 FINDINGS: There is stable endotracheal tube positioning. A nasogastric tube is seen with its distal end looped upon itself within the retrocardiac region of the lung bases. Small to moderate sized bilateral pleural effusions are seen. No pneumothorax is identified. The cardiac silhouette is mildly enlarged. The visualized skeletal structures are unremarkable. IMPRESSION: 1. Small to moderate sized bilateral pleural effusions. 2. Nasogastric tube positioning, as described above. This may be within a large gastric/hiatal hernia. Electronically Signed   By: Aram Candela M.D.   On: 03/05/2020 21:50   DG Chest Portable 1 View  Result Date: 03/05/2020 CLINICAL DATA:  Intubated EXAM: PORTABLE CHEST 1 VIEW COMPARISON:  03/05/2020, 12/03/2019 FINDINGS: Endotracheal tube tip is about 2.8 cm superior to the carina. Esophageal tube tip appears looped back upon itself in the region of GE junction with the tip directed cranial, likely over the distal esophagus. Cardiomegaly with vascular congestion, pulmonary edema, moderate pleural effusions and basilar consolidations  without change. IMPRESSION: 1. Endotracheal tube tip about 2.8 cm superior to the carina. 2. Esophageal tube tip appears looped back upon itself in the region of GE junction with the tip directed cranial, likely over the distal esophagus. 3. No change in cardiomegaly, pulmonary edema, and pleural effusions. These results will be called to the ordering clinician or representative by the Radiologist Assistant, and communication documented in the PACS or [employer]. Electronically Signed   By: Jasmine Pang M.D.   On: 03/05/2020 20:39   DG Chest Port 1 View  Result Date: 03/05/2020 CLINICAL DATA:  74 year old male with concern for sepsis. EXAM: PORTABLE CHEST 1 VIEW COMPARISON:  Chest radiograph dated 12/03/2019 FINDINGS: Mild cardiomegaly with vascular congestion and edema. Small bilateral pleural effusions with bibasilar atelectasis. Pneumonia is not excluded clinical correlation is recommended. No pneumothorax. Atherosclerotic calcification of the aorta. Degenerative changes of the spine. No acute osseous pathology. IMPRESSION: Cardiomegaly with findings of CHF and small bilateral pleural effusions. Bibasilar atelectasis versus infiltrate. Electronically Signed   By: Elgie Collard M.D.   On: 03/05/2020 19:28    EKG: I independently viewed the EKG done and my findings are as followed: Ectopic atrial tachycardia at rate of 126 bpm  Assessment/Plan Present on Admission: . Atrial fibrillation (HCC) . Atrial fibrillation with RVR (HCC) . DVT (deep venous thrombosis) (HCC) . Acute on chronic diastolic CHF (congestive heart failure) (HCC)  Principal Problem:   Acute on chronic respiratory failure with hypoxia and hypercapnia (HCC) Active Problems:   Hyperglycemia due to diabetes mellitus (HCC)   Atrial fibrillation (HCC)   Atrial fibrillation with RVR (HCC)   DVT (deep venous thrombosis) (HCC)   Acute on chronic diastolic CHF (congestive heart failure) (HCC)   SIRS (systemic inflammatory  response syndrome) (HCC)   Elevated brain natriuretic peptide (BNP) level   Acute metabolic encephalopathy secondary to acute on chronic respiratory failure with hypoxia and hypercapnia possibly due to multifactorial including chronic diastolic CHF, possible sepsis Patient was obtunded and was intubated and sedated VBG showed pH 7.159, PCO2 > 120, PO2 68.4, O2 sat 89.4% at FiO2 of 40%. Repeated ABG showed pH 7.572, PCO2 40.5, PO2 308, bicarb 37.7 at FiO2 of 100% Chest x-ray showed cardiomegaly with findings of CHF and small bilateral pleural effusions.  Bibasilar atelectasis versus infiltrate noted. Continue total input/output, daily weights and fluid restriction Continue IV Lasix 40 milligrams  twice daily Resume home meds when patient is extubated Echocardiogram done on 7/12 showed LVEF of 55 to 60% with normal LV function and no RWM abnormalities but with elevated LA pressure. Repeat echo in the morning Patient continues to be intubated and sedated at this time with IV fentanyl.  Continue with this at this time with plan to extubate as tolerated Plan to repeat ABG and chest x-ray in the morning  Elevated BNP BNP 191 (this has ranged as- 178 (8/25) < 241 < 502 (2/2) Chest x-ray showed cardiomegaly with findings of CHF Continue management as described above  SIRS with suspicion for sepsis Patient was hypothermic (95.7F), tachypneic and presents with leukocytosis (meets SIRS criteria), however source of infection is not obvious at this time except for chest x-ray that showed bibasilar atelectasis versus infiltrates (?  Infectious) Bair Hugger was provided He was empirically treated with IV vancomycin and ceftriaxone, we shall continue with IV vancomycin and cefepime with plan to de-escalate/discontinue antibiotics based on procalcitonin, urine and blood culture  Mildly elevated troponin possibly secondary to type II demand ischemia Troponin 19 > 20; this was 21-22 about 3 months ago  (8/25) No acute changes on EKG  Continue cardiac monitoring  Hyperglycemia secondary to type 2 diabetes mellitus Continue insulin sliding scale and hypoglycemic protocol Metformin will be held at this time  History of A. fib with RVR Rate is currently controlled at bedside at 60 bpm Resume home Eliquis   History of DVT Continue home Eliquis  DVT prophylaxis: Eliquis, SCDs  Code Status: Full code  Family Communication: None at bedside  Disposition Plan:  Patient is from:                        home Anticipated DC to:                   SNF or family members home Anticipated DC date:               2-3 days Anticipated DC barriers:          Patient is unstable to be discharged at this time due to hypoxia and hypercapnia requiring intubation and mechanical ventilation with suspicion for sepsis requiring IV antibiotics   Consults called: None  Admission status: Inpatient    Frankey Shownladapo Heddy Vidana MD [facility]  03/05/2020, 10:51 PM

## 2020-03-06 ENCOUNTER — Inpatient Hospital Stay (HOSPITAL_COMMUNITY): Payer: Medicare Other

## 2020-03-06 DIAGNOSIS — I5031 Acute diastolic (congestive) heart failure: Secondary | ICD-10-CM

## 2020-03-06 DIAGNOSIS — J189 Pneumonia, unspecified organism: Secondary | ICD-10-CM

## 2020-03-06 DIAGNOSIS — I825Y9 Chronic embolism and thrombosis of unspecified deep veins of unspecified proximal lower extremity: Secondary | ICD-10-CM

## 2020-03-06 DIAGNOSIS — A419 Sepsis, unspecified organism: Principal | ICD-10-CM

## 2020-03-06 LAB — BLOOD GAS, VENOUS
Acid-Base Excess: 14.5 mmol/L — ABNORMAL HIGH (ref 0.0–2.0)
Bicarbonate: 37.1 mmol/L — ABNORMAL HIGH (ref 20.0–28.0)
FIO2: 40
O2 Saturation: 64.6 %
Patient temperature: 37
pCO2, Ven: 59.4 mmHg (ref 44.0–60.0)
pH, Ven: 7.439 — ABNORMAL HIGH (ref 7.250–7.430)
pO2, Ven: 31.1 mmHg — CL (ref 32.0–45.0)

## 2020-03-06 LAB — CBC
HCT: 31.3 % — ABNORMAL LOW (ref 39.0–52.0)
Hemoglobin: 8.1 g/dL — ABNORMAL LOW (ref 13.0–17.0)
MCH: 23.8 pg — ABNORMAL LOW (ref 26.0–34.0)
MCHC: 25.9 g/dL — ABNORMAL LOW (ref 30.0–36.0)
MCV: 92.1 fL (ref 80.0–100.0)
Platelets: 269 10*3/uL (ref 150–400)
RBC: 3.4 MIL/uL — ABNORMAL LOW (ref 4.22–5.81)
RDW: 15.8 % — ABNORMAL HIGH (ref 11.5–15.5)
WBC: 9.6 10*3/uL (ref 4.0–10.5)
nRBC: 0 % (ref 0.0–0.2)

## 2020-03-06 LAB — PROCALCITONIN
Procalcitonin: <0.1 ng/mL
Procalcitonin: 0.11 ng/mL

## 2020-03-06 LAB — ECHOCARDIOGRAM COMPLETE
AR max vel: 1.2 cm2
AV Area VTI: 1.46 cm2
AV Area mean vel: 1.36 cm2
AV Mean grad: 19 mmHg
AV Peak grad: 37.9 mmHg
Ao pk vel: 3.08 m/s
Area-P 1/2: 2.76 cm2
Height: 73 in
S' Lateral: 2.9 cm
Weight: 4567.93 oz

## 2020-03-06 LAB — BLOOD GAS, ARTERIAL
Acid-Base Excess: 15.4 mmol/L — ABNORMAL HIGH (ref 0.0–2.0)
Acid-Base Excess: 14.9 mmol/L — ABNORMAL HIGH (ref 0.0–2.0)
Bicarbonate: 39.4 mmol/L — ABNORMAL HIGH (ref 20.0–28.0)
Bicarbonate: 38.5 mmol/L — ABNORMAL HIGH (ref 20.0–28.0)
FIO2: 40
FIO2: 40
O2 Saturation: 97.9 %
O2 Saturation: 98.9 %
Patient temperature: 37
Patient temperature: 38.3
pCO2 arterial: 32.9 mmHg (ref 32.0–48.0)
pCO2 arterial: 49.7 mmHg — ABNORMAL HIGH (ref 32.0–48.0)
pH, Arterial: 7.667 (ref 7.350–7.450)
pH, Arterial: 7.511 — ABNORMAL HIGH (ref 7.350–7.450)
pO2, Arterial: 67.4 mmHg — ABNORMAL LOW (ref 83.0–108.0)
pO2, Arterial: 105 mmHg (ref 83.0–108.0)

## 2020-03-06 LAB — COMPREHENSIVE METABOLIC PANEL
ALT: 8 U/L (ref 0–44)
AST: 16 U/L (ref 15–41)
Albumin: 2.8 g/dL — ABNORMAL LOW (ref 3.5–5.0)
Alkaline Phosphatase: 53 U/L (ref 38–126)
Anion gap: 12 (ref 5–15)
BUN: 26 mg/dL — ABNORMAL HIGH (ref 8–23)
CO2: 34 mmol/L — ABNORMAL HIGH (ref 22–32)
Calcium: 9 mg/dL (ref 8.9–10.3)
Chloride: 95 mmol/L — ABNORMAL LOW (ref 98–111)
Creatinine, Ser: 0.9 mg/dL (ref 0.61–1.24)
GFR, Estimated: >60 mL/min (ref >60)
Glucose, Bld: 73 mg/dL (ref 70–99)
Potassium: 4 mmol/L (ref 3.5–5.1)
Sodium: 141 mmol/L (ref 135–145)
Total Bilirubin: 1.2 mg/dL (ref 0.3–1.2)
Total Protein: 6.1 g/dL — ABNORMAL LOW (ref 6.5–8.1)

## 2020-03-06 LAB — GLUCOSE, CAPILLARY
Glucose-Capillary: 109 mg/dL — ABNORMAL HIGH (ref 70–99)
Glucose-Capillary: 60 mg/dL — ABNORMAL LOW (ref 70–99)
Glucose-Capillary: 79 mg/dL (ref 70–99)
Glucose-Capillary: 81 mg/dL (ref 70–99)
Glucose-Capillary: 81 mg/dL (ref 70–99)
Glucose-Capillary: 84 mg/dL (ref 70–99)
Glucose-Capillary: 98 mg/dL (ref 70–99)

## 2020-03-06 LAB — HEMOGLOBIN A1C
Hgb A1c MFr Bld: 6.1 % — ABNORMAL HIGH (ref 4.8–5.6)
Mean Plasma Glucose: 128.37 mg/dL

## 2020-03-06 LAB — PROTIME-INR
INR: 1.4 — ABNORMAL HIGH (ref 0.8–1.2)
Prothrombin Time: 16.5 seconds — ABNORMAL HIGH (ref 11.4–15.2)

## 2020-03-06 LAB — MRSA PCR SCREENING: MRSA by PCR: POSITIVE — AB

## 2020-03-06 LAB — MAGNESIUM: Magnesium: 1.8 mg/dL (ref 1.7–2.4)

## 2020-03-06 LAB — APTT: aPTT: 33 seconds (ref 24–36)

## 2020-03-06 LAB — PHOSPHORUS: Phosphorus: 1 mg/dL — CL (ref 2.5–4.6)

## 2020-03-06 LAB — TRIGLYCERIDES: Triglycerides: 89 mg/dL (ref <150)

## 2020-03-06 MED ORDER — INSULIN ASPART 100 UNIT/ML ~~LOC~~ SOLN
0.0000 [IU] | SUBCUTANEOUS | Status: DC
  Administered 2020-03-08: 1 [IU] via SUBCUTANEOUS

## 2020-03-06 MED ORDER — CHLORHEXIDINE GLUCONATE CLOTH 2 % EX PADS
6.0000 | MEDICATED_PAD | Freq: Every day | CUTANEOUS | Status: AC
  Administered 2020-03-07 – 2020-03-10 (×4): 6 via TOPICAL

## 2020-03-06 MED ORDER — ENOXAPARIN SODIUM 40 MG/0.4ML ~~LOC~~ SOLN
40.0000 mg | SUBCUTANEOUS | Status: DC
  Administered 2020-03-06 – 2020-03-08 (×3): 40 mg via SUBCUTANEOUS
  Filled 2020-03-06 (×4): qty 0.4

## 2020-03-06 MED ORDER — CHLORHEXIDINE GLUCONATE 0.12% ORAL RINSE (MEDLINE KIT)
15.0000 mL | Freq: Two times a day (BID) | OROMUCOSAL | Status: DC
  Administered 2020-03-06 – 2020-03-08 (×5): 15 mL via OROMUCOSAL

## 2020-03-06 MED ORDER — MUPIROCIN 2 % EX OINT
1.0000 "application " | TOPICAL_OINTMENT | Freq: Two times a day (BID) | CUTANEOUS | Status: AC
  Administered 2020-03-06 – 2020-03-10 (×10): 1 via NASAL
  Filled 2020-03-06: qty 22

## 2020-03-06 MED ORDER — METOPROLOL TARTRATE 5 MG/5ML IV SOLN
2.5000 mg | Freq: Two times a day (BID) | INTRAVENOUS | Status: DC
  Administered 2020-03-06 – 2020-03-07 (×3): 2.5 mg via INTRAVENOUS
  Filled 2020-03-06 (×3): qty 5

## 2020-03-06 MED ORDER — ORAL CARE MOUTH RINSE
15.0000 mL | OROMUCOSAL | Status: DC
  Administered 2020-03-06 – 2020-03-08 (×23): 15 mL via OROMUCOSAL

## 2020-03-06 MED ORDER — POTASSIUM PHOSPHATES 15 MMOLE/5ML IV SOLN
20.0000 mmol | Freq: Once | INTRAVENOUS | Status: AC
  Administered 2020-03-06: 20 mmol via INTRAVENOUS
  Filled 2020-03-06: qty 6.67

## 2020-03-06 MED ORDER — APIXABAN 5 MG PO TABS: 5.0000 mg | ORAL_TABLET | Freq: Two times a day (BID) | ORAL | Status: DC

## 2020-03-06 MED ORDER — PERFLUTREN LIPID MICROSPHERE
1.0000 mL | INTRAVENOUS | Status: AC | PRN
  Administered 2020-03-06: 2 mL via INTRAVENOUS

## 2020-03-06 NOTE — Progress Notes (Signed)
Critical phosphorus of 1.0 called and Amion text to Dr. Gwenlyn Perking

## 2020-03-06 NOTE — Progress Notes (Signed)
*  PRELIMINARY RESULTS* Echocardiogram 2D Echocardiogram has been performed with Definity.  Stacey Drain 03/06/2020, 12:09 PM

## 2020-03-06 NOTE — Progress Notes (Signed)
Rolled patient at 1800 to check for stool and heartrate increased to 188 beats per minute. Notified Dr. Gwenlyn Perking and received orders for Metoprolol 2.5 mg. We also performed EKG. Patient had been restrained and we removed restraints to see how patient would tolerate that. Mitts placed to help prevent self-extubation. As of this note heart rate only lowered to 126 beats per minute.

## 2020-03-06 NOTE — Progress Notes (Signed)
PROGRESS NOTE    Jonathan Wise  ZOX:[id number] DOB: 13-Jun-1945 DOA: 03/05/2020 PCP: Christain Sacramento, MD   Chief complaint: Acute respiratory failure secondary to CHF and pneumonia.   Brief Narrative:  Jonathan Wise is a 74 y.o. male with medical history significant for paroxysmal A. fib on Eliquis, chronic diastolic CHF, chronic respiratory failure on 3 L home oxygen, DVT, hypertension, hyperlipidemia, obesity (BMI 36.94), sleep apnea, insulin-dependent type 2 diabetes, venous stasis ulcers presenting to the ED via EMS for evaluation of lethargy and somnolence.  Patient was unable to provide history due to being intubated and sedated.  History was obtained from ED physician, per report, patient presented with 3-day onset of generalized weakness and within last 24 hours, he was more somnolent and not talking, so EMS was activated, on arrival of EMS patient was noted to have O2 sat in the 70s, patient was noted to be obtunded on arrival to the ED.  ED Course:  In the emergency department, she was noted to be hypothermic with a temperature of 95.33F, tachypneic, tachycardic, BP 180/163 and was on BiPAP with O2 sat of 97%.  Work-up in the ED showed leukocytosis, hyperglycemia, BNP 190, respiratory panel for influenza A, B and SARS coronavirus 2 was negative, troponin I 19 > 20, VBG showed pH 7.159, PCO2 > 120, PO2 68.4, O2 sat 89.4% at FiO2 of 40%.  Lactic acid was within normal range and urinalysis was unimpressive for UTI. Chest x-ray showed cardiomegaly with findings of CHF and small bilateral pleural effusions.  Bibasilar atelectasis versus infiltrate noted. Per ED physician, family was unable to determine patient's CODE STATUS and due to patient's obtunded state, he was intubated pending family's decision. He was treated with IV Vanco and ceftriaxone, he was initially started on IV propofol for sedation, but this was changed to IV fentanyl due to hypotension.  Hospitalist was asked to admit  patient for further evaluation and management.   Assessment & Plan: 1-acute respiratory failure with hypoxia and hypercapnia -Remains intubated and mechanically ventilated -Continue current antibiotic -Follow culture results -Continue IV diuresis.  2-acute metabolic encephalopathy -Multifactorial: Including hypoxia, hypercapnia and infection. -Provide sedation holiday -Follow clinical response.  3-Hyperglycemia due to diabetes mellitus (HCC) -Continue sliding scale insulin -Follow CBGs every 4 hours while NPO.  4-mild troponin elevation -In the setting of demand ischemia -No acute ischemic changes -Reassuring telemetry evaluation. -There was no complaint of chest pain prior to come to the hospital.  5-history of atrial fibrillation/DVT -Rate is controlled -Once able to take by mouth will resume Eliquis for secondary prevention.  6-acute on chronic diastolic heart failure -Hypertension above will provide IV diuresis -Follow daily weights and strict intake and output -Follow 2D echo results.  7-sepsis ruling -Patient with hypothermia, tachypnea and leukocytosis on presentation. -Chest x-ray suggesting source of infection with positive infiltrates. -Continue current antibiotics and follow culture result.   DVT prophylaxis: Lovenox Code Status: Full code. Family Communication: No family at bedside. Disposition:   Status is: Inpatient  Dispo: The patient is from: Home              Anticipated d/c is to: To be determined              Anticipated d/c date is: To be determined              Patient currently no medically ready for discharge; patient still with acute respiratory failure requiring ventilatory support.  Continue IV diuresis, follow electrolytes and replete  as needed; Follow 2D echo.  Continue antibiotics.       Consultants:   None   Procedures:  See below for x-ray reports Mechanical intubation and ventilatory support 2D echo:  Pending  Antimicrobials:  Vancomycin and cefepime.   Subjective: Low-grade temperature appreciated overnight; patient remains sedated, intubated and mechanically ventilated.  Hemodynamically stable otherwise and in no distress.  Objective: Vitals:   03/06/20 1100 03/06/20 1200 03/06/20 1500 03/06/20 1700  BP: (!) 138/35 (!) 134/38  (!) 128/55  Pulse: 81 84 78 86  Resp: 15 14 17 12   Temp: (!) 100.4 F (38 C) 100.2 F (37.9 C) 99.9 F (37.7 C) 99.7 F (37.6 C)  TempSrc:      SpO2: 100% 99% 99% 99%  Weight:      Height:        Intake/Output Summary (Last 24 hours) at 03/06/2020 1759 Last data filed at 03/06/2020 1728 Gross per 24 hour  Intake 1873.52 ml  Output 1820 ml  Net 53.52 ml   Filed Weights   03/05/20 1832  Weight: 129.5 kg    Examination:  General exam: Sedated, intubated and mechanically ventilated; unable to follow commands properly. Respiratory system: Decreased breath sounds at the bases and positive fine crackles; no wheezing.   Cardiovascular system: Rate controlled, no rubs, no gallops, unable to properly assess JVD with body habitus.  Positive systolic ejection murmur appreciated. Gastrointestinal system: Abdomen is obese, nondistended, soft and nontender. No organomegaly or masses felt. Normal bowel sounds heard. Central nervous system: Unable to assess secondary to current sedation. Extremities: 2+ edema appreciated bilaterally with chronic stasis dermatitis changes; no cyanosis. Skin: No petechiae.  Chronic stasis dermatitis changes appreciated bilaterally.  No signs of superimposed infection. Psychiatry: Unable to properly assess secondary to current sedation.    Data Reviewed: I have personally reviewed following labs and imaging studies  CBC: Recent Labs  Lab 03/05/20 1844 03/06/20 0416  WBC 14.5* 9.6  NEUTROABS 11.7*  --   HGB 8.9* 8.1*  HCT 37.0* 31.3*  MCV 95.4 92.1  PLT 354 032    Basic Metabolic Panel: Recent Labs  Lab  03/05/20 1844 03/06/20 0416  NA 143 141  K 5.1 4.0  CL 97* 95*  CO2 42* 34*  GLUCOSE 169* 73  BUN 21 26*  CREATININE 0.81 0.90  CALCIUM 9.2 9.0  MG  --  1.8  PHOS  --  1.0*    GFR: Estimated Creatinine Clearance: 101.5 mL/min (by C-G formula based on SCr of 0.9 mg/dL).  Liver Function Tests: Recent Labs  Lab 03/05/20 1844 03/06/20 0416  AST 15 16  ALT 7 8  ALKPHOS 66 53  BILITOT 0.9 1.2  PROT 7.2 6.1*  ALBUMIN 3.2* 2.8*    CBG: Recent Labs  Lab 03/06/20 0002 03/06/20 0430 03/06/20 0805 03/06/20 1140 03/06/20 1643  GLUCAP 98 81 79 81 109*     Recent Results (from the past 240 hour(s))  Resp Panel by RT-PCR (Flu A&B, Covid) Nasopharyngeal Swab     Status: None   Collection Time: 03/05/20  6:40 PM   Specimen: Nasopharyngeal Swab; Nasopharyngeal(NP) swabs in vial transport medium  Result Value Ref Range Status   SARS Coronavirus 2 by RT PCR NEGATIVE NEGATIVE Final    Comment: (NOTE) SARS-CoV-2 target nucleic acids are NOT DETECTED.  The SARS-CoV-2 RNA is generally detectable in upper respiratory specimens during the acute phase of infection. The lowest concentration of SARS-CoV-2 viral copies this assay can detect is 138 copies/mL.  A negative result does not preclude SARS-Cov-2 infection and should not be used as the sole basis for treatment or other patient management decisions. A negative result may occur with  improper specimen collection/handling, submission of specimen other than nasopharyngeal swab, presence of viral mutation(s) within the areas targeted by this assay, and inadequate number of viral copies(<138 copies/mL). A negative result must be combined with clinical observations, patient history, and epidemiological information. The expected result is Negative.  Fact Sheet for Patients:  EntrepreneurPulse.com.au  Fact Sheet for Healthcare Providers:  IncredibleEmployment.be  This test is no t yet approved  or cleared by the [place] FDA and  has been authorized for detection and/or diagnosis of SARS-CoV-2 by FDA under an Emergency Use Authorization (EUA). This EUA will remain  in effect (meaning this test can be used) for the duration of the COVID-19 declaration under Section 564(b)(1) of the Act, 21 U.S.C.section 360bbb-3(b)(1), unless the authorization is terminated  or revoked sooner.       Influenza A by PCR NEGATIVE NEGATIVE Final   Influenza B by PCR NEGATIVE NEGATIVE Final    Comment: (NOTE) The Xpert Xpress SARS-CoV-2/FLU/RSV plus assay is intended as an aid in the diagnosis of influenza from Nasopharyngeal swab specimens and should not be used as a sole basis for treatment. Nasal washings and aspirates are unacceptable for Xpert Xpress SARS-CoV-2/FLU/RSV testing.  Fact Sheet for Patients: EntrepreneurPulse.com.au  Fact Sheet for Healthcare Providers: IncredibleEmployment.be  This test is not yet approved or cleared by the [place] FDA and has been authorized for detection and/or diagnosis of SARS-CoV-2 by FDA under an Emergency Use Authorization (EUA). This EUA will remain in effect (meaning this test can be used) for the duration of the COVID-19 declaration under Section 564(b)(1) of the Act, 21 U.S.C. section 360bbb-3(b)(1), unless the authorization is terminated or revoked.  Performed at [facility], [address] [place], [place] [address]   Blood culture (routine single)     Status: None (Preliminary result)   Collection Time: 03/05/20  6:44 PM   Specimen: BLOOD LEFT ARM  Result Value Ref Range Status   Specimen Description BLOOD LEFT ARM  Final   Special Requests   Final    BOTTLES DRAWN AEROBIC AND ANAEROBIC Blood Culture adequate volume   Culture   Final    NO GROWTH < 12 HOURS Performed at [facility], [address] [place], [place] [address]    Report Status PENDING  Incomplete  MRSA PCR Screening      Status: Abnormal   Collection Time: 03/05/20 11:28 PM   Specimen: Nasal Mucosa; Nasopharyngeal  Result Value Ref Range Status   MRSA by PCR POSITIVE (A) NEGATIVE Final    Comment:        The GeneXpert MRSA Assay (FDA approved for NASAL specimens only), is one component of a comprehensive MRSA colonization surveillance program. It is not intended to diagnose MRSA infection nor to guide or monitor treatment for MRSA infections. RESULT CALLED TO, READ BACK BY AND VERIFIED WITH: [name],J @ 0221 ON 03/06/20 BY JUW Performed at [facility], [address], [place], [place] [address]      Radiology Studies: [facility] Chest [facility] 1 View  Result Date: 03/06/2020 CLINICAL DATA:  Dyspnea EXAM: PORTABLE CHEST 1 VIEW COMPARISON:  03/05/2020 FINDINGS: Cardiac enlargement with pulmonary vascular congestion. Moderate bilateral pleural effusions. Perihilar infiltrates consistent with edema. Similar appearance to prior study. Enteric and endotracheal tubes are unchanged in position. IMPRESSION: Cardiac enlargement with pulmonary vascular  congestion and perihilar edema and bilateral pleural effusions similar to prior study. Electronically Signed   By: Lucienne Capers M.D.   On: 03/06/2020 04:37   DG Chest Portable 1 View  Result Date: 03/05/2020 CLINICAL DATA:  NG tube placement EXAM: PORTABLE CHEST 1 VIEW COMPARISON:  03/05/2020 FINDINGS: Endotracheal tube tip is about 3 cm superior to the carina. Esophageal tube looped over the lower mediastinum with the tip projecting over the right cardiac margin, probably within large hiatal hernia. Moderate pleural effusions and basilar consolidations. IMPRESSION: Endotracheal tube tip about 3 cm superior to the carina. Esophageal tube looped over the lower mediastinum with the tip projecting over the right cardiac margin, probably within large hiatal hernia. Continued pleural effusions and basilar consolidation Electronically Signed   By: Donavan Foil M.D.   On:  03/05/2020 23:12   DG Chest Portable 1 View  Result Date: 03/05/2020 CLINICAL DATA:  Orogastric tube placement. EXAM: PORTABLE CHEST 1 VIEW COMPARISON:  March 05, 2020 FINDINGS: There is stable endotracheal tube positioning. A nasogastric tube is seen with its distal end looped upon itself within the retrocardiac region of the lung bases. Small to moderate sized bilateral pleural effusions are seen. No pneumothorax is identified. The cardiac silhouette is mildly enlarged. The visualized skeletal structures are unremarkable. IMPRESSION: 1. Small to moderate sized bilateral pleural effusions. 2. Nasogastric tube positioning, as described above. This may be within a large gastric/hiatal hernia. Electronically Signed   By: Virgina Norfolk M.D.   On: 03/05/2020 21:50   DG Chest Portable 1 View  Result Date: 03/05/2020 CLINICAL DATA:  Intubated EXAM: PORTABLE CHEST 1 VIEW COMPARISON:  03/05/2020, 12/03/2019 FINDINGS: Endotracheal tube tip is about 2.8 cm superior to the carina. Esophageal tube tip appears looped back upon itself in the region of GE junction with the tip directed cranial, likely over the distal esophagus. Cardiomegaly with vascular congestion, pulmonary edema, moderate pleural effusions and basilar consolidations without change. IMPRESSION: 1. Endotracheal tube tip about 2.8 cm superior to the carina. 2. Esophageal tube tip appears looped back upon itself in the region of GE junction with the tip directed cranial, likely over the distal esophagus. 3. No change in cardiomegaly, pulmonary edema, and pleural effusions. These results will be called to the ordering clinician or representative by the Radiologist Assistant, and communication documented in the PACS or [employer]. Electronically Signed   By: Donavan Foil M.D.   On: 03/05/2020 20:39   DG Chest Port 1 View  Result Date: 03/05/2020 CLINICAL DATA:  74 year old male with concern for sepsis. EXAM: PORTABLE CHEST 1 VIEW  COMPARISON:  Chest radiograph dated 12/03/2019 FINDINGS: Mild cardiomegaly with vascular congestion and edema. Small bilateral pleural effusions with bibasilar atelectasis. Pneumonia is not excluded clinical correlation is recommended. No pneumothorax. Atherosclerotic calcification of the aorta. Degenerative changes of the spine. No acute osseous pathology. IMPRESSION: Cardiomegaly with findings of CHF and small bilateral pleural effusions. Bibasilar atelectasis versus infiltrate. Electronically Signed   By: Anner Crete M.D.   On: 03/05/2020 19:28   ECHOCARDIOGRAM COMPLETE  Result Date: 03/06/2020    ECHOCARDIOGRAM REPORT   Patient Name:   HARSHAN KEARLEY Date of Exam: 03/06/2020 Medical Rec #:  [id number]       Height:       73.0 in Accession #:    [id number]      Weight:       285.5 lb Date of Birth:  Sep 25, 1945      BSA:  2.503 m Patient Age:    17 years        BP:           143/40 mmHg Patient Gender: M               HR:           76 bpm. Exam Location:  [facility] Procedure: 2D Echo, Cardiac Doppler and Color Doppler Indications:    CHF-Acute Diastolic 060.04 / H99.77  History:        Patient has prior history of Echocardiogram examinations, most                 recent 10/20/2019. CHF, Arrythmias:Atrial Fibrillation and PVC;                 Risk Factors:Diabetes, Dyslipidemia and Hypertension. SIRS                 (systemic inflammatory response syndrome).  Sonographer:    Alvino Chapel RCS Referring Phys: [id number] OLADAPO ADEFESO  Sonographer Comments: Patient on mechanical ventilator at time of echo. IMPRESSIONS  1. Left ventricular ejection fraction, by estimation, is 60 to 65%. The left ventricle has normal function. The left ventricle has no regional wall motion abnormalities. There is mild concentric left ventricular hypertrophy. Left ventricular diastolic parameters are consistent with Grade II diastolic dysfunction (pseudonormalization).  2. Right ventricular systolic function is normal.  The right ventricular size is normal.  3. Left atrial size was moderately dilated.  4. Right atrial size was mildly dilated.  5. The mitral valve is degenerative. No evidence of mitral valve regurgitation.  6. The aortic valve was not well visualized. There is moderate calcification of the aortic valve. There is moderate thickening of the aortic valve. Aortic valve regurgitation is not visualized. Moderate aortic valve stenosis. FINDINGS  Left Ventricle: Left ventricular ejection fraction, by estimation, is 60 to 65%. The left ventricle has normal function. The left ventricle has no regional wall motion abnormalities. Definity contrast agent was given IV to delineate the left ventricular  endocardial borders. The left ventricular internal cavity size was normal in size. There is mild concentric left ventricular hypertrophy. Left ventricular diastolic parameters are consistent with Grade II diastolic dysfunction (pseudonormalization). Normal left ventricular filling pressure. Right Ventricle: The right ventricular size is normal. No increase in right ventricular wall thickness. Right ventricular systolic function is normal. Left Atrium: Left atrial size was moderately dilated. Right Atrium: Right atrial size was mildly dilated. Pericardium: There is no evidence of pericardial effusion. Mitral Valve: The mitral valve is degenerative in appearance. No evidence of mitral valve regurgitation. Tricuspid Valve: The tricuspid valve is normal in structure. Tricuspid valve regurgitation is not demonstrated. Aortic Valve: The aortic valve was not well visualized. There is moderate calcification of the aortic valve. There is moderate thickening of the aortic valve. Aortic valve regurgitation is not visualized. Moderate aortic stenosis is present. Aortic valve  mean gradient measures 19.0 mmHg. Aortic valve peak gradient measures 37.9 mmHg. Aortic valve area, by VTI measures 1.46 cm. Pulmonic Valve: The pulmonic valve was not  well visualized. Pulmonic valve regurgitation is not visualized. Aorta: The aortic root is normal in size and structure. Venous: IVC assessment for right atrial pressure unable to be performed due to mechanical ventilation. IAS/Shunts: No atrial level shunt detected by color flow Doppler.  LEFT VENTRICLE PLAX 2D LVIDd:         4.10 cm  Diastology LVIDs:  2.90 cm  LV e' medial:    3.48 cm/s LV PW:         1.20 cm  LV E/e' medial:  24.3 LV IVS:        1.40 cm  LV e' lateral:   10.10 cm/s LVOT diam:     2.00 cm  LV E/e' lateral: 8.4 LV SV:         73 LV SV Index:   29 LVOT Area:     3.14 cm  RIGHT VENTRICLE RV S prime:     13.40 cm/s TAPSE (M-mode): 2.3 cm LEFT ATRIUM              Index       RIGHT ATRIUM           Index LA diam:        4.25 cm  1.70 cm/m  RA Area:     23.10 cm LA Vol (A2C):   90.8 ml  36.28 ml/m RA Volume:   79.00 ml  31.56 ml/m LA Vol (A4C):   116.5 ml 46.54 ml/m LA Biplane Vol: 95.8 ml  38.27 ml/m  AORTIC VALVE AV Area (Vmax):    1.20 cm AV Area (Vmean):   1.36 cm AV Area (VTI):     1.46 cm AV Vmax:           308.00 cm/s AV Vmean:          196.667 cm/s AV VTI:            0.503 m AV Peak Grad:      37.9 mmHg AV Mean Grad:      19.0 mmHg LVOT Vmax:         118.00 cm/s LVOT Vmean:        85.000 cm/s LVOT VTI:          0.233 m LVOT/AV VTI ratio: 0.46  AORTA Ao Root diam: 3.10 cm MITRAL VALVE MV Area (PHT): 2.76 cm    SHUNTS MV Decel Time: 275 msec    Systemic VTI:  0.23 m MV E velocity: 84.40 cm/s  Systemic Diam: 2.00 cm MV A velocity: 31.50 cm/s MV E/A ratio:  2.68 Mihai Croitoru MD Electronically signed by Sanda Klein MD Signature Date/Time: 03/06/2020/1:15:08 PM    Final     Scheduled Meds: . chlorhexidine gluconate (MEDLINE KIT)  15 mL Mouth Rinse BID  . Chlorhexidine Gluconate Cloth  6 each Topical Q0600  . enoxaparin (LOVENOX) injection  40 mg Subcutaneous Q24H  . furosemide  40 mg Intravenous Q12H  . insulin aspart  0-9 Units Subcutaneous Q4H  . ipratropium-albuterol   3 mL Nebulization Once  . mouth rinse  15 mL Mouth Rinse 10 times per day  . mupirocin ointment  1 application Nasal BID   Continuous Infusions: . ceFEPime (MAXIPIME) IV 2 g (03/06/20 1628)  . fentaNYL infusion INTRAVENOUS Stopped (03/05/20 2349)  . [COMPLETED] potassium PHOSPHATE IVPB (in mmol) 20 mmol (03/06/20 1025)  . propofol (DIPRIVAN) infusion 20 mcg/kg/min (03/06/20 1623)  . vancomycin 1,250 mg (03/06/20 8315)     LOS: 1 day    Time spent: 35 minutes   Barton Dubois, MD [facility]   To contact the attending provider between 7A-7P or the covering provider during after hours 7P-7A, please log into the web site [email or web address] and access using universal [place] password for that web site. If you do not have the password, please call the hospital operator.  03/06/2020, 5:59 PM

## 2020-03-06 NOTE — Progress Notes (Signed)
Decreased rate from 20 to 12 for high ph 7.66 for am blood gas. New gas set for 0900.

## 2020-03-07 ENCOUNTER — Inpatient Hospital Stay (HOSPITAL_COMMUNITY): Payer: Medicare Other

## 2020-03-07 LAB — BLOOD GAS, ARTERIAL
Acid-Base Excess: 12.9 mmol/L — ABNORMAL HIGH (ref 0.0–2.0)
Bicarbonate: 36.6 mmol/L — ABNORMAL HIGH (ref 20.0–28.0)
FIO2: 35
O2 Saturation: 97.4 %
Patient temperature: 36.1
pCO2 arterial: 40.2 mmHg (ref 32.0–48.0)
pH, Arterial: 7.563 — ABNORMAL HIGH (ref 7.350–7.450)
pO2, Arterial: 83.7 mmHg (ref 83.0–108.0)

## 2020-03-07 LAB — URINE CULTURE: Culture: NO GROWTH

## 2020-03-07 LAB — GLUCOSE, CAPILLARY
Glucose-Capillary: 115 mg/dL — ABNORMAL HIGH (ref 70–99)
Glucose-Capillary: 69 mg/dL — ABNORMAL LOW (ref 70–99)
Glucose-Capillary: 81 mg/dL (ref 70–99)
Glucose-Capillary: 88 mg/dL (ref 70–99)
Glucose-Capillary: 103 mg/dL — ABNORMAL HIGH (ref 70–99)

## 2020-03-07 LAB — PROCALCITONIN: Procalcitonin: 0.17 ng/mL

## 2020-03-07 MED ORDER — METOPROLOL TARTRATE 5 MG/5ML IV SOLN: 2.5000 mg | Freq: Once | INTRAVENOUS | Status: DC

## 2020-03-07 NOTE — Progress Notes (Signed)
PROGRESS NOTE    Jonathan Wise  MOL:[id number] DOB: [birth year] DOA: 03/05/2020 PCP: Christain Sacramento, MD   Chief complaint: Acute respiratory failure secondary to CHF and pneumonia.   Brief Narrative:  Jonathan Wise is a 74 y.o. male with medical history significant for paroxysmal A. fib on Eliquis, chronic diastolic CHF, chronic respiratory failure on 3 L home oxygen, DVT, hypertension, hyperlipidemia, obesity (BMI 36.94), sleep apnea, insulin-dependent type 2 diabetes, venous stasis ulcers presenting to the ED via EMS for evaluation of lethargy and somnolence.  Patient was unable to provide history due to being intubated and sedated.  History was obtained from ED physician, per report, patient presented with 3-day onset of generalized weakness and within last 24 hours, he was more somnolent and not talking, so EMS was activated, on arrival of EMS patient was noted to have O2 sat in the 70s, patient was noted to be obtunded on arrival to the ED.  ED Course:  In the emergency department, she was noted to be hypothermic with a temperature of 95.61F, tachypneic, tachycardic, BP 180/163 and was on BiPAP with O2 sat of 97%.  Work-up in the ED showed leukocytosis, hyperglycemia, BNP 190, respiratory panel for influenza A, B and SARS coronavirus 2 was negative, troponin I 19 > 20, VBG showed pH 7.159, PCO2 > 120, PO2 68.4, O2 sat 89.4% at FiO2 of 40%.  Lactic acid was within normal range and urinalysis was unimpressive for UTI. Chest x-ray showed cardiomegaly with findings of CHF and small bilateral pleural effusions.  Bibasilar atelectasis versus infiltrate noted. Per ED physician, family was unable to determine patient's CODE STATUS and due to patient's obtunded state, he was intubated pending family's decision. He was treated with IV Vanco and ceftriaxone, he was initially started on IV propofol for sedation, but this was changed to IV fentanyl due to hypotension.  Hospitalist was asked to admit  patient for further evaluation and management.   Assessment & Plan: 1-acute respiratory failure with hypoxia and hypercapnia -Remains intubated and mechanically ventilated -Continue current antibiotic -Follow culture results. -ABG with a stable CO2 and O2 levels; pH slightly alkalotic in the setting of metabolic alkalosis. -Chest x-ray demonstrating bilateral moderate pleural effusion-still findings of mild interstitial pulmonary edema. -Continue IV diuresis.  2-acute metabolic encephalopathy -Multifactorial: Including hypoxia, hypercapnia and infection. -Provide sedation holiday and start SBT's. -Continue to follow clinical response. -Hopefully extubated in the next 24-48 hours.  3-Hyperglycemia due to diabetes mellitus (HCC) -Continue sliding scale insulin -Follow CBGs every 4 hours while NPO.  4-mild troponin elevation -In the setting of demand ischemia -No acute ischemic changes -Continue telemetry monitoring. -There was no complaint of chest pain prior to come to the hospital.  5-history of atrial fibrillation/DVT -Rate is controlled -Continue IV beta-blocker -Once able to take by mouth will resume Eliquis for secondary prevention; using now Lovenox acutely for DVT prophylaxis.  6-acute on chronic diastolic heart failure -Continue IV diuresis. -Follow daily weights and strict intake and output -2D echo demonstrating grade 2 diastolic dysfunction; preserved ejection fraction.  No wall motion normalities.  7-sepsis rule in and present on admission. -Patient with hypothermia, tachypnea and leukocytosis on presentation. -Chest x-ray suggesting source of infection with positive infiltrates. -Continue current antibiotics and follow culture result.   DVT prophylaxis: Lovenox Code Status: Full code. Family Communication: No family at bedside. Disposition:   Status is: Inpatient  Dispo: The patient is from: Home  Anticipated d/c is to: To be determined               Anticipated d/c date is: To be determined              Patient currently no medically ready for discharge; patient still with acute respiratory failure requiring ventilatory support.  Continue IV diuresis, follow electrolytes and replete as needed; Follow 2D echo.  Continue antibiotics.       Consultants:   None   Procedures:  See below for x-ray reports Mechanical intubation and ventilatory support 2D echo: 1. Left ventricular ejection fraction, by estimation, is 60 to 65%. The  left ventricle has normal function. The left ventricle has no regional  wall motion abnormalities. There is mild concentric left ventricular  hypertrophy. Left ventricular diastolic  parameters are consistent with Grade II diastolic dysfunction  (pseudonormalization).  2. Right ventricular systolic function is normal. The right ventricular  size is normal.  3. Left atrial size was moderately dilated.  4. Right atrial size was mildly dilated.  5. The mitral valve is degenerative. No evidence of mitral valve  regurgitation.  6. The aortic valve was not well visualized. There is moderate  calcification of the aortic valve. There is moderate thickening of the  aortic valve. Aortic valve regurgitation is not visualized. Moderate  aortic valve stenosis.   Antimicrobials:  Vancomycin and cefepime.   Subjective: Afebrile currently; no nausea or vomiting reported.  Patient remains sedated, intubated and mechanically ventilated.  Objective: Vitals:   03/07/20 1200 03/07/20 1300 03/07/20 1400 03/07/20 1500  BP: (!) 142/74  (!) 104/53 114/60  Pulse: 97 (!) 107 (!) 108 (!) 108  Resp: 17 18 18  (!) 21  Temp: (!) 97.3 F (36.3 C) 97.7 F (36.5 C) 98.1 F (36.7 C) 98.4 F (36.9 C)  TempSrc:      SpO2: 98% 98% 98% 98%  Weight:      Height:        Intake/Output Summary (Last 24 hours) at 03/07/2020 1605 Last data filed at 03/07/2020 1600 Gross per 24 hour  Intake 1850.52 ml  Output  4800 ml  Net -2949.48 ml   Filed Weights   03/05/20 1832 03/07/20 0500  Weight: 129.5 kg 130 kg    Examination: General exam: Afebrile, sedated, intubated and mechanically ventilated.  Not following commands and per nursing reports requiring mittens to prevent self extubation. Respiratory system: Improved air movement; fine crackles at the bases, no wheezing.  Positive rhonchi bilaterally Cardiovascular system: Rate controlled, no rubs, no gallops, unable to assess JVD due to body habitus.  Positive systolic ejection murmur appreciated on exam. Gastrointestinal system: Abdomen is obese, nondistended, soft and nontender.  Positive bowel sounds. Central nervous system: Unable to properly assess secondary to sedation and ventilatory support currently. Extremities: No cyanosis or clubbing.  2+ edema appreciated bilaterally. Skin: No petechiae; positive chronic stasis dermatitis appreciated on both legs Psychiatry: Unable to properly assess secondary to current sedation/ventilatory support.   Data Reviewed: I have personally reviewed following labs and imaging studies  CBC: Recent Labs  Lab 03/05/20 1844 03/06/20 0416  WBC 14.5* 9.6  NEUTROABS 11.7*  --   HGB 8.9* 8.1*  HCT 37.0* 31.3*  MCV 95.4 92.1  PLT 354 884    Basic Metabolic Panel: Recent Labs  Lab 03/05/20 1844 03/06/20 0416  NA 143 141  K 5.1 4.0  CL 97* 95*  CO2 42* 34*  GLUCOSE 169* 73  BUN 21 26*  CREATININE  0.81 0.90  CALCIUM 9.2 9.0  MG  --  1.8  PHOS  --  1.0*    GFR: Estimated Creatinine Clearance: 101.8 mL/min (by C-G formula based on SCr of 0.9 mg/dL).  Liver Function Tests: Recent Labs  Lab 03/05/20 1844 03/06/20 0416  AST 15 16  ALT 7 8  ALKPHOS 66 53  BILITOT 0.9 1.2  PROT 7.2 6.1*  ALBUMIN 3.2* 2.8*    CBG: Recent Labs  Lab 03/06/20 2343 03/07/20 0431 03/07/20 0735 03/07/20 1142 03/07/20 1553  GLUCAP 84 115* 69* 81 88     Recent Results (from the past 240 hour(s))  Resp  Panel by RT-PCR (Flu A&B, Covid) Nasopharyngeal Swab     Status: None   Collection Time: 03/05/20  6:40 PM   Specimen: Nasopharyngeal Swab; Nasopharyngeal(NP) swabs in vial transport medium  Result Value Ref Range Status   SARS Coronavirus 2 by RT PCR NEGATIVE NEGATIVE Final    Comment: (NOTE) SARS-CoV-2 target nucleic acids are NOT DETECTED.  The SARS-CoV-2 RNA is generally detectable in upper respiratory specimens during the acute phase of infection. The lowest concentration of SARS-CoV-2 viral copies this assay can detect is 138 copies/mL. A negative result does not preclude SARS-Cov-2 infection and should not be used as the sole basis for treatment or other patient management decisions. A negative result may occur with  improper specimen collection/handling, submission of specimen other than nasopharyngeal swab, presence of viral mutation(s) within the areas targeted by this assay, and inadequate number of viral copies(<138 copies/mL). A negative result must be combined with clinical observations, patient history, and epidemiological information. The expected result is Negative.  Fact Sheet for Patients:  EntrepreneurPulse.com.au  Fact Sheet for Healthcare Providers:  IncredibleEmployment.be  This test is no t yet approved or cleared by the [place] FDA and  has been authorized for detection and/or diagnosis of SARS-CoV-2 by FDA under an Emergency Use Authorization (EUA). This EUA will remain  in effect (meaning this test can be used) for the duration of the COVID-19 declaration under Section 564(b)(1) of the Act, 21 U.S.C.section 360bbb-3(b)(1), unless the authorization is terminated  or revoked sooner.       Influenza A by PCR NEGATIVE NEGATIVE Final   Influenza B by PCR NEGATIVE NEGATIVE Final    Comment: (NOTE) The Xpert Xpress SARS-CoV-2/FLU/RSV plus assay is intended as an aid in the diagnosis of influenza from Nasopharyngeal  swab specimens and should not be used as a sole basis for treatment. Nasal washings and aspirates are unacceptable for Xpert Xpress SARS-CoV-2/FLU/RSV testing.  Fact Sheet for Patients: EntrepreneurPulse.com.au  Fact Sheet for Healthcare Providers: IncredibleEmployment.be  This test is not yet approved or cleared by the [place] FDA and has been authorized for detection and/or diagnosis of SARS-CoV-2 by FDA under an Emergency Use Authorization (EUA). This EUA will remain in effect (meaning this test can be used) for the duration of the COVID-19 declaration under Section 564(b)(1) of the Act, 21 U.S.C. section 360bbb-3(b)(1), unless the authorization is terminated or revoked.  Performed at [facility], [address] [place], [place] [address]   Blood culture (routine single)     Status: None (Preliminary result)   Collection Time: 03/05/20  6:44 PM   Specimen: BLOOD LEFT ARM  Result Value Ref Range Status   Specimen Description BLOOD LEFT ARM  Final   Special Requests   Final    BOTTLES DRAWN AEROBIC AND ANAEROBIC Blood Culture adequate volume   Culture   Final  NO GROWTH 2 DAYS Performed at [facility], [address], [place], [place] [address]    Report Status PENDING  Incomplete  Urine culture     Status: None   Collection Time: 03/05/20  8:12 PM   Specimen: In/Out Cath Urine  Result Value Ref Range Status   Specimen Description   Final    IN/OUT CATH URINE Performed at [facility], [address], [place], [place] [address]    Special Requests   Final    NONE Performed at [facility], [address], [place], [place] [address]    Culture   Final    NO GROWTH Performed at [facility], [name] [address], [place], [place] [address]    Report Status 03/07/2020 FINAL  Final  MRSA PCR Screening     Status: Abnormal   Collection Time: 03/05/20 11:28 PM   Specimen: Nasal Mucosa; Nasopharyngeal  Result Value Ref Range  Status   MRSA by PCR POSITIVE (A) NEGATIVE Final    Comment:        The GeneXpert MRSA Assay (FDA approved for NASAL specimens only), is one component of a comprehensive MRSA colonization surveillance program. It is not intended to diagnose MRSA infection nor to guide or monitor treatment for MRSA infections. RESULT CALLED TO, READ BACK BY AND VERIFIED WITH: [name],J @ 0221 ON 03/06/20 BY JUW Performed at [facility], [address] [place], [place] [address]      Radiology Studies: DG CHEST PORT 1 VIEW  Result Date: 03/07/2020 CLINICAL DATA:  Shortness of breath. EXAM: PORTABLE CHEST 1 VIEW COMPARISON:  March 06, 2020 FINDINGS: Stable support apparatus Cardiomediastinal silhouette is normal. Mediastinal contours appear intact. There is no evidence of pneumothorax. Bilateral moderate to large pleural effusions. Mild diffuse interstitial prominence. Osseous structures are without acute abnormality. Soft tissues are grossly normal. IMPRESSION: 1. Bilateral moderate to large pleural effusions. 2. Mild interstitial pulmonary edema. Electronically Signed   By: Fidela Salisbury M.D.   On: 03/07/2020 09:30   DG Chest Port 1 View  Result Date: 03/06/2020 CLINICAL DATA:  Dyspnea EXAM: PORTABLE CHEST 1 VIEW COMPARISON:  03/05/2020 FINDINGS: Cardiac enlargement with pulmonary vascular congestion. Moderate bilateral pleural effusions. Perihilar infiltrates consistent with edema. Similar appearance to prior study. Enteric and endotracheal tubes are unchanged in position. IMPRESSION: Cardiac enlargement with pulmonary vascular congestion and perihilar edema and bilateral pleural effusions similar to prior study. Electronically Signed   By: Lucienne Capers M.D.   On: 03/06/2020 04:37   DG Chest Portable 1 View  Result Date: 03/05/2020 CLINICAL DATA:  NG tube placement EXAM: PORTABLE CHEST 1 VIEW COMPARISON:  03/05/2020 FINDINGS: Endotracheal tube tip is about 3 cm superior to the carina.  Esophageal tube looped over the lower mediastinum with the tip projecting over the right cardiac margin, probably within large hiatal hernia. Moderate pleural effusions and basilar consolidations. IMPRESSION: Endotracheal tube tip about 3 cm superior to the carina. Esophageal tube looped over the lower mediastinum with the tip projecting over the right cardiac margin, probably within large hiatal hernia. Continued pleural effusions and basilar consolidation Electronically Signed   By: Donavan Foil M.D.   On: 03/05/2020 23:12   DG Chest Portable 1 View  Result Date: 03/05/2020 CLINICAL DATA:  Orogastric tube placement. EXAM: PORTABLE CHEST 1 VIEW COMPARISON:  March 05, 2020 FINDINGS: There is stable endotracheal tube positioning. A nasogastric tube is seen with its distal end looped upon itself within the retrocardiac region of the lung bases.  Small to moderate sized bilateral pleural effusions are seen. No pneumothorax is identified. The cardiac silhouette is mildly enlarged. The visualized skeletal structures are unremarkable. IMPRESSION: 1. Small to moderate sized bilateral pleural effusions. 2. Nasogastric tube positioning, as described above. This may be within a large gastric/hiatal hernia. Electronically Signed   By: Virgina Norfolk M.D.   On: 03/05/2020 21:50   DG Chest Portable 1 View  Result Date: 03/05/2020 CLINICAL DATA:  Intubated EXAM: PORTABLE CHEST 1 VIEW COMPARISON:  03/05/2020, 12/03/2019 FINDINGS: Endotracheal tube tip is about 2.8 cm superior to the carina. Esophageal tube tip appears looped back upon itself in the region of GE junction with the tip directed cranial, likely over the distal esophagus. Cardiomegaly with vascular congestion, pulmonary edema, moderate pleural effusions and basilar consolidations without change. IMPRESSION: 1. Endotracheal tube tip about 2.8 cm superior to the carina. 2. Esophageal tube tip appears looped back upon itself in the region of GE junction  with the tip directed cranial, likely over the distal esophagus. 3. No change in cardiomegaly, pulmonary edema, and pleural effusions. These results will be called to the ordering clinician or representative by the Radiologist Assistant, and communication documented in the PACS or [employer]. Electronically Signed   By: Donavan Foil M.D.   On: 03/05/2020 20:39   DG Chest Port 1 View  Result Date: 03/05/2020 CLINICAL DATA:  74 year old male with concern for sepsis. EXAM: PORTABLE CHEST 1 VIEW COMPARISON:  Chest radiograph dated 12/03/2019 FINDINGS: Mild cardiomegaly with vascular congestion and edema. Small bilateral pleural effusions with bibasilar atelectasis. Pneumonia is not excluded clinical correlation is recommended. No pneumothorax. Atherosclerotic calcification of the aorta. Degenerative changes of the spine. No acute osseous pathology. IMPRESSION: Cardiomegaly with findings of CHF and small bilateral pleural effusions. Bibasilar atelectasis versus infiltrate. Electronically Signed   By: Anner Crete M.D.   On: 03/05/2020 19:28   ECHOCARDIOGRAM COMPLETE  Result Date: 03/06/2020    ECHOCARDIOGRAM REPORT   Patient Name:   HAMDI KLEY Date of Exam: 03/06/2020 Medical Rec #:  [id number]       Height:       73.0 in Accession #:    [id number]      Weight:       285.5 lb Date of Birth:  [birth year]      BSA:          2.503 m Patient Age:    71 years        BP:           143/40 mmHg Patient Gender: M               HR:           76 bpm. Exam Location:  [facility] Procedure: 2D Echo, Cardiac Doppler and Color Doppler Indications:    CHF-Acute Diastolic 716.96 / V89.38  History:        Patient has prior history of Echocardiogram examinations, most                 recent 10/20/2019. CHF, Arrythmias:Atrial Fibrillation and PVC;                 Risk Factors:Diabetes, Dyslipidemia and Hypertension. SIRS                 (systemic inflammatory response syndrome).  Sonographer:    Alvino Chapel RCS  Referring Phys: [id number] OLADAPO ADEFESO  Sonographer Comments: Patient on mechanical ventilator at time of echo. IMPRESSIONS  1. Left  ventricular ejection fraction, by estimation, is 60 to 65%. The left ventricle has normal function. The left ventricle has no regional wall motion abnormalities. There is mild concentric left ventricular hypertrophy. Left ventricular diastolic parameters are consistent with Grade II diastolic dysfunction (pseudonormalization).  2. Right ventricular systolic function is normal. The right ventricular size is normal.  3. Left atrial size was moderately dilated.  4. Right atrial size was mildly dilated.  5. The mitral valve is degenerative. No evidence of mitral valve regurgitation.  6. The aortic valve was not well visualized. There is moderate calcification of the aortic valve. There is moderate thickening of the aortic valve. Aortic valve regurgitation is not visualized. Moderate aortic valve stenosis. FINDINGS  Left Ventricle: Left ventricular ejection fraction, by estimation, is 60 to 65%. The left ventricle has normal function. The left ventricle has no regional wall motion abnormalities. Definity contrast agent was given IV to delineate the left ventricular  endocardial borders. The left ventricular internal cavity size was normal in size. There is mild concentric left ventricular hypertrophy. Left ventricular diastolic parameters are consistent with Grade II diastolic dysfunction (pseudonormalization). Normal left ventricular filling pressure. Right Ventricle: The right ventricular size is normal. No increase in right ventricular wall thickness. Right ventricular systolic function is normal. Left Atrium: Left atrial size was moderately dilated. Right Atrium: Right atrial size was mildly dilated. Pericardium: There is no evidence of pericardial effusion. Mitral Valve: The mitral valve is degenerative in appearance. No evidence of mitral valve regurgitation. Tricuspid Valve: The  tricuspid valve is normal in structure. Tricuspid valve regurgitation is not demonstrated. Aortic Valve: The aortic valve was not well visualized. There is moderate calcification of the aortic valve. There is moderate thickening of the aortic valve. Aortic valve regurgitation is not visualized. Moderate aortic stenosis is present. Aortic valve  mean gradient measures 19.0 mmHg. Aortic valve peak gradient measures 37.9 mmHg. Aortic valve area, by VTI measures 1.46 cm. Pulmonic Valve: The pulmonic valve was not well visualized. Pulmonic valve regurgitation is not visualized. Aorta: The aortic root is normal in size and structure. Venous: IVC assessment for right atrial pressure unable to be performed due to mechanical ventilation. IAS/Shunts: No atrial level shunt detected by color flow Doppler.  LEFT VENTRICLE PLAX 2D LVIDd:         4.10 cm  Diastology LVIDs:         2.90 cm  LV e' medial:    3.48 cm/s LV PW:         1.20 cm  LV E/e' medial:  24.3 LV IVS:        1.40 cm  LV e' lateral:   10.10 cm/s LVOT diam:     2.00 cm  LV E/e' lateral: 8.4 LV SV:         73 LV SV Index:   29 LVOT Area:     3.14 cm  RIGHT VENTRICLE RV S prime:     13.40 cm/s TAPSE (M-mode): 2.3 cm LEFT ATRIUM              Index       RIGHT ATRIUM           Index LA diam:        4.25 cm  1.70 cm/m  RA Area:     23.10 cm LA Vol (A2C):   90.8 ml  36.28 ml/m RA Volume:   79.00 ml  31.56 ml/m LA Vol (A4C):   116.5 ml 46.54 ml/m LA Biplane  Vol: 95.8 ml  38.27 ml/m  AORTIC VALVE AV Area (Vmax):    1.20 cm AV Area (Vmean):   1.36 cm AV Area (VTI):     1.46 cm AV Vmax:           308.00 cm/s AV Vmean:          196.667 cm/s AV VTI:            0.503 m AV Peak Grad:      37.9 mmHg AV Mean Grad:      19.0 mmHg LVOT Vmax:         118.00 cm/s LVOT Vmean:        85.000 cm/s LVOT VTI:          0.233 m LVOT/AV VTI ratio: 0.46  AORTA Ao Root diam: 3.10 cm MITRAL VALVE MV Area (PHT): 2.76 cm    SHUNTS MV Decel Time: 275 msec    Systemic VTI:  0.23 m MV E  velocity: 84.40 cm/s  Systemic Diam: 2.00 cm MV A velocity: 31.50 cm/s MV E/A ratio:  2.68 Mihai Croitoru MD Electronically signed by Sanda Klein MD Signature Date/Time: 03/06/2020/1:15:08 PM    Final     Scheduled Meds: . chlorhexidine gluconate (MEDLINE KIT)  15 mL Mouth Rinse BID  . Chlorhexidine Gluconate Cloth  6 each Topical Q0600  . enoxaparin (LOVENOX) injection  40 mg Subcutaneous Q24H  . furosemide  40 mg Intravenous Q12H  . insulin aspart  0-9 Units Subcutaneous Q4H  . ipratropium-albuterol  3 mL Nebulization Once  . mouth rinse  15 mL Mouth Rinse 10 times per day  . metoprolol tartrate  2.5 mg Intravenous Q12H  . mupirocin ointment  1 application Nasal BID   Continuous Infusions: . ceFEPime (MAXIPIME) IV 2 g (03/07/20 1540)  . fentaNYL infusion INTRAVENOUS Stopped (03/05/20 2349)  . propofol (DIPRIVAN) infusion 10 mcg/kg/min (03/07/20 1538)  . vancomycin Stopped (03/07/20 0957)     LOS: 2 days    Time spent: 35 minutes   Barton Dubois, MD [facility]   To contact the attending provider between 7A-7P or the covering provider during after hours 7P-7A, please log into the web site [email or web address] and access using universal [place] password for that web site. If you do not have the password, please call the hospital operator.  03/07/2020, 4:05 PM

## 2020-03-08 DIAGNOSIS — J90 Pleural effusion, not elsewhere classified: Secondary | ICD-10-CM | POA: Diagnosis not present

## 2020-03-08 DIAGNOSIS — I4891 Unspecified atrial fibrillation: Secondary | ICD-10-CM

## 2020-03-08 DIAGNOSIS — E46 Unspecified protein-calorie malnutrition: Secondary | ICD-10-CM | POA: Diagnosis present

## 2020-03-08 DIAGNOSIS — J9621 Acute and chronic respiratory failure with hypoxia: Secondary | ICD-10-CM

## 2020-03-08 DIAGNOSIS — I5033 Acute on chronic diastolic (congestive) heart failure: Secondary | ICD-10-CM

## 2020-03-08 DIAGNOSIS — J9622 Acute and chronic respiratory failure with hypercapnia: Secondary | ICD-10-CM

## 2020-03-08 LAB — COMPREHENSIVE METABOLIC PANEL
ALT: 11 U/L (ref 0–44)
AST: 24 U/L (ref 15–41)
Albumin: 2.3 g/dL — ABNORMAL LOW (ref 3.5–5.0)
Alkaline Phosphatase: 65 U/L (ref 38–126)
Anion gap: 15 (ref 5–15)
BUN: 33 mg/dL — ABNORMAL HIGH (ref 8–23)
CO2: 30 mmol/L (ref 22–32)
Calcium: 8.2 mg/dL — ABNORMAL LOW (ref 8.9–10.3)
Chloride: 94 mmol/L — ABNORMAL LOW (ref 98–111)
Creatinine, Ser: 1.04 mg/dL (ref 0.61–1.24)
GFR, Estimated: >60 mL/min (ref >60)
Glucose, Bld: 103 mg/dL — ABNORMAL HIGH (ref 70–99)
Potassium: 3.2 mmol/L — ABNORMAL LOW (ref 3.5–5.1)
Sodium: 139 mmol/L (ref 135–145)
Total Bilirubin: 1.5 mg/dL — ABNORMAL HIGH (ref 0.3–1.2)
Total Protein: 6 g/dL — ABNORMAL LOW (ref 6.5–8.1)

## 2020-03-08 LAB — GLUCOSE, CAPILLARY
Glucose-Capillary: 144 mg/dL — ABNORMAL HIGH (ref 70–99)
Glucose-Capillary: 157 mg/dL — ABNORMAL HIGH (ref 70–99)
Glucose-Capillary: 68 mg/dL — ABNORMAL LOW (ref 70–99)
Glucose-Capillary: 78 mg/dL (ref 70–99)
Glucose-Capillary: 81 mg/dL (ref 70–99)
Glucose-Capillary: 87 mg/dL (ref 70–99)

## 2020-03-08 LAB — CBC
HCT: 32.2 % — ABNORMAL LOW (ref 39.0–52.0)
Hemoglobin: 9 g/dL — ABNORMAL LOW (ref 13.0–17.0)
MCH: 23.4 pg — ABNORMAL LOW (ref 26.0–34.0)
MCHC: 28 g/dL — ABNORMAL LOW (ref 30.0–36.0)
MCV: 83.6 fL (ref 80.0–100.0)
Platelets: 302 10*3/uL (ref 150–400)
RBC: 3.85 MIL/uL — ABNORMAL LOW (ref 4.22–5.81)
RDW: 17.2 % — ABNORMAL HIGH (ref 11.5–15.5)
WBC: 20.4 10*3/uL — ABNORMAL HIGH (ref 4.0–10.5)
nRBC: 0 % (ref 0.0–0.2)

## 2020-03-08 LAB — PHOSPHORUS: Phosphorus: 3.6 mg/dL (ref 2.5–4.6)

## 2020-03-08 MED ORDER — SODIUM CHLORIDE 0.9 % IV SOLN
1.0000 g | INTRAVENOUS | Status: AC
  Administered 2020-03-08 – 2020-03-12 (×5): 1 g via INTRAVENOUS
  Filled 2020-03-08 (×5): qty 10

## 2020-03-08 MED ORDER — AMIODARONE HCL IN DEXTROSE 360-4.14 MG/200ML-% IV SOLN
60.0000 mg/h | INTRAVENOUS | Status: AC
  Administered 2020-03-08: 60 mg/h via INTRAVENOUS
  Filled 2020-03-08: qty 200

## 2020-03-08 MED ORDER — AMIODARONE HCL IN DEXTROSE 360-4.14 MG/200ML-% IV SOLN: 30.0000 mg/h | INTRAVENOUS | Status: DC

## 2020-03-08 MED ORDER — ONDANSETRON HCL 4 MG/2ML IJ SOLN: 4.0000 mg | Freq: Once | INTRAMUSCULAR | Status: AC

## 2020-03-08 MED ORDER — METOPROLOL TARTRATE 5 MG/5ML IV SOLN
2.5000 mg | Freq: Three times a day (TID) | INTRAVENOUS | Status: DC
  Administered 2020-03-08 – 2020-03-09 (×3): 2.5 mg via INTRAVENOUS
  Filled 2020-03-08 (×3): qty 5

## 2020-03-08 MED ORDER — METHYLPREDNISOLONE SODIUM SUCC 40 MG IJ SOLR
40.0000 mg | Freq: Once | INTRAMUSCULAR | Status: AC
  Administered 2020-03-08: 40 mg via INTRAVENOUS
  Filled 2020-03-08: qty 1

## 2020-03-08 MED ORDER — AMIODARONE HCL IN DEXTROSE 360-4.14 MG/200ML-% IV SOLN
30.0000 mg/h | INTRAVENOUS | Status: DC
  Administered 2020-03-09 – 2020-03-10 (×5): 30 mg/h via INTRAVENOUS
  Filled 2020-03-08 (×5): qty 200

## 2020-03-08 MED ORDER — PANTOPRAZOLE SODIUM 40 MG IV SOLR
40.0000 mg | INTRAVENOUS | Status: DC
  Administered 2020-03-08: 40 mg via INTRAVENOUS
  Filled 2020-03-08: qty 40

## 2020-03-08 MED ORDER — INSULIN ASPART 100 UNIT/ML ~~LOC~~ SOLN
0.0000 [IU] | Freq: Three times a day (TID) | SUBCUTANEOUS | Status: DC
  Administered 2020-03-08 – 2020-03-09 (×2): 2 [IU] via SUBCUTANEOUS
  Administered 2020-03-09 – 2020-03-10 (×3): 1 [IU] via SUBCUTANEOUS
  Administered 2020-03-11: 2 [IU] via SUBCUTANEOUS
  Administered 2020-03-11 – 2020-03-12 (×3): 1 [IU] via SUBCUTANEOUS
  Administered 2020-03-13: 2 [IU] via SUBCUTANEOUS

## 2020-03-08 MED ORDER — ONDANSETRON HCL 4 MG/2ML IJ SOLN
INTRAMUSCULAR | Status: AC
  Administered 2020-03-08: 4 mg via INTRAVENOUS
  Filled 2020-03-08: qty 2

## 2020-03-08 MED ORDER — AMIODARONE IV BOLUS ONLY 150 MG/100ML
150.0000 mg | Freq: Once | INTRAVENOUS | Status: AC
  Administered 2020-03-08: 150 mg via INTRAVENOUS
  Filled 2020-03-08: qty 100

## 2020-03-08 MED ORDER — DEXMEDETOMIDINE HCL IN NACL 400 MCG/100ML IV SOLN: 0.4000 ug/kg/h | INTRAVENOUS | Status: DC

## 2020-03-08 NOTE — Progress Notes (Signed)
Patient extubated with Victorino Dike from RT. Tolerated procedure well. Placed on nasal cannula 2 liters for comfort.

## 2020-03-08 NOTE — Progress Notes (Signed)
PROGRESS NOTE    ROBYN NOHR  GQQ:[id number] DOB: 1945-12-03 DOA: 03/05/2020 PCP: [name], MD   Chief complaint: Acute respiratory failure secondary to CHF and pneumonia.   Brief Narrative:  [place] DUCRE is a 74 y.o. male with medical history significant for paroxysmal A. fib on Eliquis, chronic diastolic CHF, chronic respiratory failure on 3 L home oxygen, DVT, hypertension, hyperlipidemia, obesity (BMI 36.94), sleep apnea, insulin-dependent type 2 diabetes, venous stasis ulcers presenting to the ED via EMS for evaluation of lethargy and somnolence.  Patient was unable to provide history due to being intubated and sedated.  History was obtained from ED physician, per report, patient presented with 3-day onset of generalized weakness and within last 24 hours, he was more somnolent and not talking, so EMS was activated, on arrival of EMS patient was noted to have O2 sat in the 70s, patient was noted to be obtunded on arrival to the ED.  ED Course:  In the emergency department, she was noted to be hypothermic with a temperature of 95.57F, tachypneic, tachycardic, BP 180/163 and was on BiPAP with O2 sat of 97%.  Work-up in the ED showed leukocytosis, hyperglycemia, BNP 190, respiratory panel for influenza A, B and SARS coronavirus 2 was negative, troponin I 19 > 20, VBG showed pH 7.159, PCO2 > 120, PO2 68.4, O2 sat 89.4% at FiO2 of 40%.  Lactic acid was within normal range and urinalysis was unimpressive for UTI. Chest x-ray showed cardiomegaly with findings of CHF and small bilateral pleural effusions.  Bibasilar atelectasis versus infiltrate noted. Per ED physician, family was unable to determine patient's CODE STATUS and due to patient's obtunded state, he was intubated pending family's decision. He was treated with IV Vanco and ceftriaxone, he was initially started on IV propofol for sedation, but this was changed to IV fentanyl due to hypotension.  Hospitalist was asked to admit  patient for further evaluation and management.   Assessment & Plan: 1-acute respiratory failure with hypoxia and hypercapnia -Remains intubated and mechanically ventilated -Continue current antibiotic -Follow culture results. -ABG with a stable CO2 and O2 levels; pH slightly alkalotic in the setting of diuresis and metabolic alkalosis. -Chest x-ray demonstrating bilateral moderate pleural effusion-still findings of mild interstitial pulmonary edema. -Continue IV diuresis.  2-acute metabolic encephalopathy -Multifactorial: Including hypoxia, hypercapnia and infection. -Provide sedation holiday and start SBT's. -Continue to follow clinical response. -Hopefully extubated today. -Appreciate assistance and recommendation by pulmonologist.  3-Hyperglycemia due to diabetes mellitus (HCC) -Continue sliding scale insulin -Follow CBGs every 4 hours while NPO.  4-mild troponin elevation -In the setting of demand ischemia -No acute ischemic changes -Continue telemetry monitoring. -There was no complaint of chest pain prior to come to the hospital.  5-history of atrial fibrillation/DVT -Rate is fluctuating; rate control agents difficult to manage in the setting of sedation and soft blood pressure. -Continue IV beta-blocker; dose adjusted.  IV amiodarone x2 doses provided. -Once able to take by mouth will resume Eliquis for secondary prevention; using now Lovenox acutely for DVT prophylaxis.  6-acute on chronic diastolic heart failure -Continue IV diuresis. -Follow daily weights and strict intake and output -2D echo demonstrating grade 2 diastolic dysfunction; preserved ejection fraction and No wall motion normalities.  7-sepsis rule in and present on admission. -Patient with hypothermia, tachypnea and leukocytosis on presentation. -Chest x-ray suggesting source of infection with positive infiltrates. 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 -Continue supportive care.   DVT prophylaxis: Lovenox Code Status: Full code. Family Communication: No family at bedside. Disposition:   Status is: Inpatient  Dispo: The patient is from: Home              Anticipated d/c is to: To be determined              Anticipated d/c date is: To be determined              Patient currently no medically ready for discharge; patient still with acute respiratory failure requiring ventilatory support.  Continue IV diuresis, follow electrolytes and replete as needed; continue IV antibiotics (narrowing to just Rocephin).     Consultants:   None   Procedures:  See below for x-ray reports Mechanical intubation and ventilatory support 2D echo: 1. Left ventricular ejection fraction, by estimation, is 60 to 65%. The  left ventricle has normal function. The left ventricle has no regional  wall motion abnormalities. There is mild concentric left ventricular  hypertrophy. Left ventricular diastolic  parameters are consistent with Grade II diastolic dysfunction  (pseudonormalization).  2. Right ventricular systolic function is normal. The right ventricular  size is normal.  3. Left atrial size was moderately dilated.  4. Right atrial size was mildly dilated.  5. The mitral valve is degenerative. No evidence of mitral valve  regurgitation.  6. The aortic valve was not well visualized. There is moderate  calcification of the aortic valve. There is moderate thickening of the  aortic valve. Aortic valve regurgitation is not visualized. Moderate  aortic valve stenosis.   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  Patient denies chest pain.  Still intubated and mechanically ventilated.  Following commands and working well with SBT's.  Objective: Vitals:    03/08/20 1500 03/08/20 1600 03/08/20 1624 03/08/20 1700  BP: (!) 127/49 (!) 65/42  (!) 105/52  Pulse: (!) 56 87 (!) 54 (!) 51  Resp: (!) 27 (!) 35 (!) 29 (!) 23  Temp: 98.8 F (37.1 C) 98.4 F (36.9 C) 98.4 F (36.9 C) 98.4 F (36.9 C)  TempSrc:   Bladder   SpO2: 99% 97% 99% 97%  Weight:      Height:        Intake/Output Summary (Last 24 hours) at 03/08/2020 1750 Last data filed at 03/08/2020 1700 Gross per 24 hour  Intake 837.49 ml  Output 1200 ml  Net -362.51 ml   Filed Weights   03/05/20 1832 03/07/20 0500 03/08/20 0500  Weight: 129.5 kg 130 kg 130.1 kg    Examination: General exam: Intubated and mechanically ventilated; following commands and working well with SBT.  Still with decreased breath sounds at the bases but no using accessory muscle. Respiratory system: Fine crackles; no wheezing, positive scattered rhonchi. Cardiovascular system: Irregular, positive systolic murmur, no rubs, no gallops.  Unable to assess JVD with body habitus. Gastrointestinal system: Abdomen is obese, nondistended, soft and nontender. No organomegaly or masses felt. Normal bowel sounds heard. Central nervous system: No focal neurological deficits. Extremities: No cyanosis or clubbing; 1+ edema appreciated bilaterally.  Chronic stasis dermatitis appreciated on his legs. 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 Psychiatry: Mood & affect appropriate.   Data Reviewed: I have personally reviewed following labs and imaging studies  CBC: Recent Labs  Lab 03/05/20 1844 03/06/20 0416 03/08/20 1317  WBC 14.5* 9.6 20.4*  NEUTROABS 11.7*  --   --   HGB 8.9* 8.1* 9.0*  HCT 37.0* 31.3* 32.2*  MCV 95.4 92.1 83.6  PLT 354 269 302    Basic Metabolic Panel: Recent Labs  Lab 03/05/20 1844 03/06/20 0416 03/08/20 1317  NA 143 141 139  K 5.1 4.0 3.2*  CL 97* 95* 94*  CO2 42* 34* 30  GLUCOSE 169* 73 103*    BUN 21 26* 33*  CREATININE 0.81 0.90 1.04  CALCIUM 9.2 9.0 8.2*  MG  --  1.8  --   PHOS  --  1.0* 3.6    GFR: Estimated Creatinine Clearance: 88.1 mL/min (by C-G formula based on SCr of 1.04 mg/dL).  Liver Function Tests: Recent Labs  Lab 03/05/20 1844 03/06/20 0416 03/08/20 1317  AST 15 16 24   ALT 7 8 11   ALKPHOS 66 53 65  BILITOT 0.9 1.2 1.5*  PROT 7.2 6.1* 6.0*  ALBUMIN 3.2* 2.8* 2.3*    CBG: Recent Labs  Lab 03/08/20 0042 03/08/20 0345 03/08/20 0723 03/08/20 1114 03/08/20 1629  GLUCAP 78 81 68* 87 144*     Recent Results (from the past 240 hour(s))  Resp Panel by RT-PCR (Flu A&B, Covid) Nasopharyngeal Swab     Status: None   Collection Time: 03/05/20  6:40 PM   Specimen: Nasopharyngeal Swab; Nasopharyngeal(NP) swabs in vial transport medium  Result Value Ref Range Status   SARS Coronavirus 2 by RT PCR NEGATIVE NEGATIVE Final    Comment: (NOTE) SARS-CoV-2 target nucleic acids are NOT DETECTED.  The SARS-CoV-2 RNA is generally detectable in upper respiratory specimens during the acute phase of infection. The lowest concentration of SARS-CoV-2 viral copies this assay can detect is 138 copies/mL. A negative result does not preclude SARS-Cov-2 infection and should not be used as the sole basis for treatment or other patient management decisions. A negative result may occur with  improper specimen collection/handling, submission of specimen other than nasopharyngeal swab, presence of viral mutation(s) within the areas targeted by this assay, and inadequate number of viral copies(<138 copies/mL). A negative result must be combined with clinical observations, patient history, and epidemiological information. The expected result is Negative.  Fact Sheet for Patients:  03/10/20  Fact Sheet for Healthcare Providers:  03/07/20  This test is no t yet approved or cleared by the [employer] FDA  and  has been authorized for detection and/or diagnosis of SARS-CoV-2 by FDA under an Emergency Use Authorization (EUA). This EUA will remain  in effect (meaning this test can be used) for the duration of the COVID-19 declaration under Section 564(b)(1) of the Act, 21 U.S.C.section 360bbb-3(b)(1), unless the authorization is terminated  or revoked sooner.       Influenza A by PCR NEGATIVE NEGATIVE Final   Influenza B by PCR NEGATIVE NEGATIVE Final    Comment: (NOTE) The Xpert Xpress SARS-CoV-2/FLU/RSV plus assay is intended as an aid in the diagnosis of influenza from Nasopharyngeal swab specimens and should not be used as a sole basis for treatment. Nasal washings and aspirates are unacceptable for Xpert Xpress SARS-CoV-2/FLU/RSV testing.  Fact Sheet for Patients: [employer]  Fact Sheet for Healthcare Providers: [place]  This test is not yet approved or cleared by the [employer] FDA and has been authorized for detection and/or diagnosis of SARS-CoV-2 by FDA under an Emergency Use Authorization (EUA). This EUA will remain in effect (meaning this test can be used)  for the duration of the COVID-19 declaration under Section 564(b)(1) of the Act, 21 U.S.C. section 360bbb-3(b)(1), unless the authorization is terminated or revoked.  Performed at [facility], [address], [place], [place] [address]   Blood culture (routine single)     Status: None (Preliminary result)   Collection Time: 03/05/20  6:44 PM   Specimen: BLOOD LEFT ARM  Result Value Ref Range Status   Specimen Description BLOOD LEFT ARM  Final   Special Requests   Final    BOTTLES DRAWN AEROBIC AND ANAEROBIC Blood Culture adequate volume   Culture   Final    NO GROWTH 3 DAYS Performed at [facility], [address] [place], [place] [address]    Report Status PENDING  Incomplete  Urine culture     Status: None   Collection Time: 03/05/20  8:12  PM   Specimen: In/Out Cath Urine  Result Value Ref Range Status   Specimen Description   Final    IN/OUT CATH URINE Performed at [facility], [address], [place], [place] [address]    Special Requests   Final    NONE Performed at [facility], [address] [place], [place] [address]    Culture   Final    NO GROWTH Performed at [facility], [address] [place], [place] [address]    Report Status 03/07/2020 FINAL  Final  MRSA PCR Screening     Status: Abnormal   Collection Time: 03/05/20 11:28 PM   Specimen: Nasal Mucosa; Nasopharyngeal  Result Value Ref Range Status   MRSA by PCR POSITIVE (A) NEGATIVE Final    Comment:        The GeneXpert MRSA Assay (FDA approved for NASAL specimens only), is one component of a comprehensive MRSA colonization surveillance program. It is not intended to diagnose MRSA infection nor to guide or monitor treatment for MRSA infections. RESULT CALLED TO, READ BACK BY AND VERIFIED WITH: [name],J @ 0221 ON 03/06/20 BY JUW Performed at [facility], [address], [place], [place] [address]      Radiology Studies: DG CHEST PORT 1 VIEW  Result Date: 03/07/2020 CLINICAL DATA:  Shortness of breath. EXAM: PORTABLE CHEST 1 VIEW COMPARISON:  March 06, 2020 FINDINGS: Stable support apparatus Cardiomediastinal silhouette is normal. Mediastinal contours appear intact. There is no evidence of pneumothorax. Bilateral moderate to large pleural effusions. Mild diffuse interstitial prominence. Osseous structures are without acute abnormality. Soft tissues are grossly normal. IMPRESSION: 1. Bilateral moderate to large pleural effusions. 2. Mild interstitial pulmonary edema. Electronically Signed   By: Ted Mcalpine M.D.   On: 03/07/2020 09:30    Scheduled Meds: . Chlorhexidine Gluconate Cloth  6 each Topical Q0600  . enoxaparin (LOVENOX) injection  40 mg Subcutaneous Q24H  . furosemide  40 mg Intravenous Q12H  . insulin aspart  0-9  Units Subcutaneous Q4H  . ipratropium-albuterol  3 mL Nebulization Once  . metoprolol tartrate  2.5 mg Intravenous Once  . metoprolol tartrate  2.5 mg Intravenous Q8H  . mupirocin ointment  1 application Nasal BID   Continuous Infusions: . cefTRIAXone (ROCEPHIN)  IV 200 mL/hr at 03/08/20 1700  . fentaNYL infusion INTRAVENOUS Stopped (03/05/20 2349)     LOS: 3 days    Time spent: 35 minutes   Vassie Loll, MD [facility]   To contact the attending provider between 7A-7P or the covering provider during after hours 7P-7A, please log into the web site [email or web address] and access using universal  [place] password for that web site. If you do not have the password, please call the hospital operator.  03/08/2020, 5:50 PM

## 2020-03-08 NOTE — Consult Note (Addendum)
NAME:  Jonathan Wise, MRN:  [id number], DOB:  1946/02/22, LOS: 3 ADMISSION DATE:  03/05/2020, CONSULTATION DATE:  11/29 REFERRING MD:  [place], CHIEF COMPLAINT:  resp failure   Brief History   89 yowm remote smoker with chf/afib/obesity with osa and IDDM admitted 11/26 with hypercarbic/ hypoxemic resp failure and required ET in ER and PCCM asked to see am 11/29.  History of present illness   74 y.o. male with medical history significant for paroxysmal A. fib on Eliquis, chronic diastolic CHF, chronic respiratory failure on 3 L home oxygen, DVT, hypertension, hyperlipidemia, obesity (BMI 36.94), sleep apnea, insulin-dependent type 2 diabetes, venous stasis ulcers presenting to the ED via EMS for evaluation of lethargy and somnolence.  Patient was unable to provide history due to being intubated and sedated.  History was obtained from ED physician, per report, patient presented with 3-day onset of generalized weakness and within last 24 hours, he was more somnolent and not talking, so EMS was activated, on arrival of EMS patient was noted to have O2 sat in the 70s, patient was noted to be obtunded on arrival to the ED.  ED Course:  In the emergency department  noted to be hypothermic with a temperature of 95.20F, tachypneic, tachycardic, BP 180/163 and was on BiPAP with O2 sat of 97%.  Work-up in the ED showed leukocytosis, hyperglycemia, BNP 190, respiratory panel for influenza A, B and SARS coronavirus 2 was negative, troponin I 19 > 20, VBG showed pH 7.159, PCO2 > 120, PO2 68.4, O2 sat 89.4% at FiO2 of 40%.  Lactic acid was within normal range and urinalysis was unimpressive for UTI. Chest x-ray showed cardiomegaly with findings of CHF and small bilateral pleural effusions.  Bibasilar atelectasis versus infiltrate noted. Per ED physician, family was unable to determine patient's CODE STATUS and due to patient's obtunded state, he was intubated pending family's decision. He was treated with  IV Vanco and ceftriaxone, he was initially started on IV propofol for sedation, but this was changed to IV fentanyl due to hypotension.  Hospitalist was asked to admit patient for further evaluation and management.   Past Medical History   Atrial fibrillation ([place]), Bilateral lower extremity edema, Cellulitis, DVT (deep venous thrombosis) ([place]) (1994), Hyperlipidemia, Hypertension, Kidney stones, Morbid obesity ([place]), Pressure ulcer, Sleep apnea, Type 2 diabetes mellitus ([place]), Urinary retention (10/15/2011), and Venous stasis ulcers ([place]).    Significant Hospital Events   PCCM   Consults:  PCCM   Procedures:  Oral ET  11/26  >  11/29   Significant Diagnostic Tests:  Echo 11/27 :  Grade 2 diastolic dysfunction with moderate LAE , mild RAE and moderate AS   Micro Data:  Resp viral panel 11/26 > neg including covd 19 pcr  BC  11/26  >>> UC  11/26  Neg  MRSA PCR  11/26 >  POS   Antimicrobials:  Rocephin 11/26  X one dose Vanc 11/26 >>>  11/29 ? Red man syndrome?  Cefepime 11/26 >>>  11/29 Rocephin 11/29 >>>   Scheduled Meds: . chlorhexidine gluconate (MEDLINE KIT)  15 mL Mouth Rinse BID  . Chlorhexidine Gluconate Cloth  6 each Topical Q0600  . enoxaparin (LOVENOX) injection  40 mg Subcutaneous Q24H  . furosemide  40 mg Intravenous Q12H  . insulin aspart  0-9 Units Subcutaneous Q4H  . ipratropium-albuterol  3 mL Nebulization Once  . mouth rinse  15 mL Mouth Rinse 10 times per day  . metoprolol tartrate  2.5 mg  Intravenous Q12H  . metoprolol tartrate  2.5 mg Intravenous Once  . mupirocin ointment  1 application Nasal BID  . pantoprazole (PROTONIX) IV  40 mg Intravenous Q24H   Continuous Infusions: . cefTRIAXone (ROCEPHIN)  IV    . fentaNYL infusion INTRAVENOUS Stopped (03/05/20 2349)  . propofol (DIPRIVAN) infusion 5 mcg/kg/min (03/08/20 0854)   PRN Meds:.midazolam   Interim history/subjective:  Much more alert per nursing s increased wob on psv  Objective   Blood  pressure (!) 73/46, pulse (!) 110, temperature 99.3 F (37.4 C), resp. rate 18, height _0  (1.854 m), weight 130.1 kg, SpO2 96 %.    Vent Mode: PSV;CPAP FiO2 (%):  [35 %] 35 % Set Rate:  [12 bmp] 12 bmp Vt Set:  [630 mL] 630 mL PEEP:  [5 cmH20] 5 cmH20 Pressure Support:  [13 cmH20] 13 cmH20 Plateau Pressure:  [15 cmH20-16 cmH20] 15 cmH20   Intake/Output Summary (Last 24 hours) at 03/08/2020 1321 Last data filed at 03/08/2020 0800 Gross per 24 hour  Intake 1013.53 ml  Output 1850 ml  Net -836.47 ml   Filed Weights   03/05/20 1832 03/07/20 0500 03/08/20 0500  Weight: 129.5 kg 130 kg 130.1 kg    Examination: Tmax 100.2  General: chronically ill appearing obese wm comfortable on physiologic PSV HENT: oral et  Lungs: distant bs s wheeze  Cardiovascular: IRIR afib/pvc's on monitor Abdomen: massively obese/ soft Extremities: anasarca Neuro: appears intact, fc including lifting head off pillow and touching chin  Skin:  Venous ulcers on LEs per nursing / rash c/w red man syndrome from vanc     I personally reviewed images and agree with radiology impression as follows:  CXR:   Portable 11/28 1. Bilateral moderate to large pleural effusions. 2. Mild interstitial pulmonary edema.   Resolved Hospital Problem list      Assessment & Plan:  1) probably has OHS  secondary to MO assoc with OSA so may benefit at least short term with bipap post extubation 11/29  2) CHF with diastolic dysfunction and AS  >>> continue diuresis plus add aldatone once taking po if bp/k/bun/ creat allow   3) bilateral pleural effusions secondary to #2  >>> continue diuresis as above  4) Atrial arrythmia with rate control >> rx per [facility]/ cards prn   5) Protein calorie malnutrition with low albumin aggravating 3r spacing Best practice (evaluated daily)   Diet: npo Pain/Anxiety/Delirium protocol (if indicated): d/c at extubation 11/29 VAP protocol (if indicated):  DVT prophylaxis: Lovenox GI  prophylaxis: ppi Glucose control: per [facility] Mobility: up as soon as possible to chair position  Communication/goals of care: per [facility]  Code Status: full Disposition: ICU  Labs   CBC: Recent Labs  Lab 03/05/20 1844 03/06/20 0416  WBC 14.5* 9.6  NEUTROABS 11.7*  --   HGB 8.9* 8.1*  HCT 37.0* 31.3*  MCV 95.4 92.1  PLT 354 726    Basic Metabolic Panel: Recent Labs  Lab 03/05/20 1844 03/06/20 0416  NA 143 141  K 5.1 4.0  CL 97* 95*  CO2 42* 34*  GLUCOSE 169* 73  BUN 21 26*  CREATININE 0.81 0.90  CALCIUM 9.2 9.0  MG  --  1.8  PHOS  --  1.0*   GFR: Estimated Creatinine Clearance: 101.9 mL/min (by C-G formula based on SCr of 0.9 mg/dL). Recent Labs  Lab 03/05/20 1844 03/05/20 2044 03/06/20 0004 03/06/20 0416 03/07/20 0435  PROCALCITON  --   --  <0.10 0.11 0.17  WBC 14.5*  --   --  9.6  --   LATICACIDVEN 0.9 1.6  --   --   --     Liver Function Tests: Recent Labs  Lab 03/05/20 1844 03/06/20 0416  AST 15 16  ALT 7 8  ALKPHOS 66 53  BILITOT 0.9 1.2  PROT 7.2 6.1*  ALBUMIN 3.2* 2.8*   No results for input(s): LIPASE, AMYLASE in the last 168 hours. No results for input(s): AMMONIA in the last 168 hours.  ABG    Component Value Date/Time   PHART 7.563 (H) 03/07/2020 0717   PCO2ART 40.2 03/07/2020 0717   PO2ART 83.7 03/07/2020 0717   HCO3 36.6 (H) 03/07/2020 0717   TCO2 21 (L) 10/20/2019 0632   ACIDBASEDEF 3.0 (H) 10/20/2019 0632   O2SAT 97.4 03/07/2020 0717     Coagulation Profile: Recent Labs  Lab 03/05/20 1844 03/06/20 0416  INR 1.3* 1.4*    Cardiac Enzymes: No results for input(s): CKTOTAL, CKMB, CKMBINDEX, TROPONINI in the last 168 hours.  HbA1C: Hgb A1c MFr Bld  Date/Time Value Ref Range Status  03/06/2020 04:16 AM 6.1 (H) 4.8 - 5.6 % Final    Comment:    (NOTE) Pre diabetes:          5.7%-6.4%  Diabetes:              >6.4%  Glycemic control for   <7.0% adults with diabetes   12/04/2019 12:00 AM 5.6 4.8 - 5.6 % Final     Comment:    (NOTE) Pre diabetes:          5.7%-6.4%  Diabetes:              >6.4%  Glycemic control for   <7.0% adults with diabetes     CBG: Recent Labs  Lab 03/07/20 2015 03/08/20 0042 03/08/20 0345 03/08/20 0723 03/08/20 1114  GLUCAP 103* 78 81 68* 87       Past Medical History  He,  has a past medical history of Atrial fibrillation ([place]), Bilateral lower extremity edema, Cellulitis, DVT (deep venous thrombosis) ([place]) (1994), Hyperlipidemia, Hypertension, Kidney stones, Morbid obesity ([place]), Pressure ulcer, Sleep apnea, Type 2 diabetes mellitus ([place]), Urinary retention (10/15/2011), and Venous stasis ulcers ([place]).   Surgical History    Past Surgical History:  Procedure Laterality Date  . CYSTOSCOPY  10/18/2011   Procedure: CYSTOSCOPY FLEXIBLE;  Surgeon: Marissa Nestle, MD;  Location: [facility];  Service: Urology;  Laterality: N/A;  . HERNIA REPAIR     Umbilical     Social History   reports that he quit smoking about 15 years ago. His smoking use included cigarettes. He has a 180.00 pack-year smoking history. He has never used smokeless tobacco. He reports that he does not drink alcohol and does not use drugs.   Family History   His family history includes Diabetes in his mother; Emphysema in his father.   Allergies No Known Allergies   Home Medications  Prior to Admission medications   Medication Sig Start Date End Date Taking? Authorizing Provider  apixaban (ELIQUIS) 5 MG TABS tablet Take 1 tablet (5 mg total) by mouth 2 (two) times daily. 07/22/17  Yes Kayleen Memos, DO  metFORMIN (GLUCOPHAGE) 500 MG tablet Take 500 mg by mouth 2 (two) times daily with a meal.   Yes [provider]  metoprolol tartrate (LOPRESSOR) 25 MG tablet Take 0.5 tablets (12.5 mg total) by mouth 2 (two) times daily. 11/03/19  Yes Adhikari, Tamsen Meek,  MD  pravastatin (PRAVACHOL) 40 MG tablet Take 40 mg by mouth daily.   Yes [provider]  TRESIBA FLEXTOUCH 100 UNIT/ML  FlexTouch Pen Inject 26 Units into the skin daily. 10/03/19  Yes [provider]  digoxin (LANOXIN) 0.125 MG tablet Take 1 tablet by mouth daily at 8 pm. 11/13/19   [provider]  furosemide (LASIX) 40 MG tablet Take 1 tablet (40 mg total) by mouth daily. Patient not taking: Reported on 03/08/2020 12/07/19 01/06/20  Aline August, MD  hydrocerin (EUCERIN) CREA Apply 1 application topically 2 (two) times daily. Patient not taking: Reported on 03/08/2020 05/19/19   Kathie Dike, MD  lactulose (CHRONULAC) 10 GM/15ML solution Take 15 mLs (10 g total) by mouth 2 (two) times daily. Patient not taking: Reported on 12/31/2019 12/07/19   Aline August, MD  polyethylene glycol (MIRALAX / GLYCOLAX) 17 g packet Take 17 g by mouth daily as needed for moderate constipation. Patient not taking: Reported on 12/31/2019 11/03/19   Shelly Coss, MD  tamsulosin (FLOMAX) 0.4 MG CAPS capsule Take 1 capsule (0.4 mg total) by mouth daily. Patient not taking: Reported on 03/08/2020 11/03/19   Shelly Coss, MD  zinc sulfate 220 (50 Zn) MG capsule Take by mouth.    [provider]      The patient is critically ill with multiple organ systems failure and requires high complexity decision making for assessment and support, frequent evaluation and titration of therapies, application of advanced monitoring technologies and extensive interpretation of multiple databases. Critical Care Time devoted to patient care services described in this note is 45 minutes.   Christinia Gully, MD [facility]monary [facility] [place] [phone]   After 7:00 pm call [place]  [phone]

## 2020-03-08 NOTE — Procedures (Signed)
Extubation Procedure Note  Patient Details:   Name: Jonathan Wise DOB: 1945/07/04 MRN: [id number]   Airway Documentation:  Airway 7.5 mm (Active)  Secured at (cm) 25 cm 03/08/20 1108  Measured From Lips 03/08/20 1108  Secured Location Right 03/08/20 1108  Secured By [employer] 03/08/20 1108  Tube Holder Repositioned Yes 03/08/20 1108  Prone position No 03/08/20 1108  Head position Left 03/08/20 1108  Cuff Pressure (cm H2O) 28 cm H2O 03/08/20 0800  Site Condition Dry 03/08/20 1108   Vent end date 11/29 - Vent end time 1428   Evaluation  O2 sats: 97% Complications: no complications noted Patient tolerated procedure well. Bilateral Breath Sounds:  (coarse)   Patient is alert and talking to [place].  Lucia Gaskins 03/08/2020, 2:26 PM

## 2020-03-09 ENCOUNTER — Inpatient Hospital Stay (HOSPITAL_COMMUNITY): Payer: Medicare Other

## 2020-03-09 DIAGNOSIS — I4891 Unspecified atrial fibrillation: Secondary | ICD-10-CM | POA: Diagnosis not present

## 2020-03-09 DIAGNOSIS — J9621 Acute and chronic respiratory failure with hypoxia: Secondary | ICD-10-CM | POA: Diagnosis not present

## 2020-03-09 DIAGNOSIS — E46 Unspecified protein-calorie malnutrition: Secondary | ICD-10-CM | POA: Diagnosis not present

## 2020-03-09 DIAGNOSIS — I5033 Acute on chronic diastolic (congestive) heart failure: Secondary | ICD-10-CM | POA: Diagnosis not present

## 2020-03-09 LAB — TRIGLYCERIDES: Triglycerides: 165 mg/dL — ABNORMAL HIGH (ref <150)

## 2020-03-09 LAB — GLUCOSE, CAPILLARY
Glucose-Capillary: 106 mg/dL — ABNORMAL HIGH (ref 70–99)
Glucose-Capillary: 107 mg/dL — ABNORMAL HIGH (ref 70–99)
Glucose-Capillary: 107 mg/dL — ABNORMAL HIGH (ref 70–99)
Glucose-Capillary: 121 mg/dL — ABNORMAL HIGH (ref 70–99)
Glucose-Capillary: 137 mg/dL — ABNORMAL HIGH (ref 70–99)
Glucose-Capillary: 190 mg/dL — ABNORMAL HIGH (ref 70–99)

## 2020-03-09 MED ORDER — APIXABAN 5 MG PO TABS
5.0000 mg | ORAL_TABLET | Freq: Two times a day (BID) | ORAL | Status: DC
  Administered 2020-03-09 – 2020-03-13 (×9): 5 mg via ORAL
  Filled 2020-03-09 (×9): qty 1

## 2020-03-09 MED ORDER — ENSURE ENLIVE PO LIQD
237.0000 mL | Freq: Two times a day (BID) | ORAL | Status: DC
  Administered 2020-03-09 – 2020-03-13 (×6): 237 mL via ORAL

## 2020-03-09 MED ORDER — PROSOURCE PLUS PO LIQD
30.0000 mL | Freq: Two times a day (BID) | ORAL | Status: DC
  Administered 2020-03-09 – 2020-03-13 (×5): 30 mL via ORAL
  Filled 2020-03-09 (×5): qty 30

## 2020-03-09 MED ORDER — ADULT MULTIVITAMIN W/MINERALS CH
1.0000 | ORAL_TABLET | Freq: Every day | ORAL | Status: DC
  Administered 2020-03-09 – 2020-03-13 (×5): 1 via ORAL
  Filled 2020-03-09 (×5): qty 1

## 2020-03-09 NOTE — Progress Notes (Signed)
Wraps on legs changed per pt request. Elevated on pillows. Pt states that his legs feel good and his is comfortable.

## 2020-03-09 NOTE — Progress Notes (Signed)
Initial Nutrition Assessment  DOCUMENTATION CODES:   Obesity unspecified  INTERVENTION:  Ensure Enlive po BID, each supplement provides 350 kcal and 20 grams of protein (chocolate/strawberry)  ProSource Plus 30 ml po BID, each supplement provides 100 kcal and 15 grams of protein  MVI with minerals po daily  Mechanically altered dysphagia 3 (chopped meats) for ease of intake   NUTRITION DIAGNOSIS:   Moderate Malnutrition related to chronic illness (chronic respiratory failure; CHF) as evidenced by mild fat depletion, severe fat depletion, moderate muscle depletion, edema, energy intake < or equal to 75% for > or equal to 1 month.    GOAL:   Patient will meet greater than or equal to 90% of their needs    MONITOR:   PO intake, Supplement acceptance, Weight trends, Labs, I & O's  REASON FOR ASSESSMENT:   Rounds    ASSESSMENT:  74 year old male with history significant for paroxysmal A fib on Eliquis, chronic dCHF, chronic respiratory failure on 3 L home O2, DVT, HTN, HLD, sleep apnea, IDDM2, venous stasis ulcers presented with 3 day onset of generalized weakness with increased somnolence, noted obtunded on arrival and subsequently intubated.  Patient admitted with acute on chronic respiratory failure with hypoxia and hypercapnia, requiring intubation on 11/26.  He was extubated 11/29, tolerated well and placed on 2 L [place]. Patient resting quietly in bed this afternoon, reports feeling tired. He endorses ~50% po of lunch tray, recalls eating most of the sweet potatoes and some fruit, did not eat the [place]. Patient reports baseline appetite has been poor for a while seondary to being out of breath and chronically tired. Recalls usually eating 2 scrambled eggs with shredded cheese and wheat toast for breakfast, wife prepares a hot lunch, recalls mostly "simple foods" like soup and salad for dinner. Occasionally he drinks Equate nutrition supplements, likes chocolate and strawberry  flavors. RD educated on trying small frequent meals/snacks throughout the day vs trying to eat 3 larger meals and encouraged daily ONS. Patient is agreeable to drinking Ensure BID as well as mechanically altered dysphagia 3 diet (chopped meats) for ease of intake. Will also order ProSource Plus BID to aid with meeting needs.   Patient is unsure of dry weight and does not weigh himself regularly. He recalls weighing ~181 lbs not too long ago. Weights have trended up in the last 9 months, suspect secondary to fluid shifts related to CHF. On 05/18/19 he weighed 117.3 kg (258.06 lbs), on 06/17/19 he weighed 121.6 kg (267.52 lbs), on 11/04/19 weights significantly increased to 132.5 kg (291.5 lbs), on 12/04/19 weights decreased to 129.5 kg (284.9 lbs) and today he weighs 123.4 kg (271.48 lbs). Moderate pitting BLE and mild pitting BUE edema per flowsheets.   I/Os: -2906.1 ml since admit UOP: 1200 ml x 24 hrs  Medications reviewed and include: Lasix IV 40 mg every 12 hours, SSI Amiodarone @ 16.67 ml/hr  Labs: CBGs 107,107,137,157, K 3.2 (L), BUN 33 (H), WBC 20.4 (H), Hgb 9.0 (L), HCT 32.2 (L) 11/26 BNP 190.0 (H) 11/27 A1c 6.1 (well controlled)  Per notes: -cardiomegaly, findings of CHF and small bilateral pleural effusions on CXR -mild anasarca -OHS probable secondary to MO associated with OSA -BiPAP as needed  NUTRITION - FOCUSED PHYSICAL EXAM: Mild upper arm, severe orbital, buccal fat depletion; Moderate temple,clavicle, dorsal hand muscle depletion; Moderate BLE, Mild BUE edema   Diet Order:   Diet Order            Diet heart  healthy/carb modified Room service appropriate? Yes; Fluid consistency: Thin  Diet effective now                 EDUCATION NEEDS:   Education needs have been addressed  Skin:  Skin Assessment: Reviewed RN Assessment  Last BM:  11/29 type 6  Height:   Ht Readings from Last 1 Encounters:  03/08/20 6\' 1"  (1.854 m)    Weight:   Wt Readings from Last 1  Encounters:  03/09/20 123.4 kg    BMI:  Body mass index is 35.89 kg/m.  Estimated Nutritional Needs:   Kcal:  03/11/20  Protein:  138-150  Fluid:  >/= 2 L/day   [id number], RD, LDN Clinical Nutrition After Hours/Weekend Pager # in Amion

## 2020-03-09 NOTE — Progress Notes (Signed)
PROGRESS NOTE    Jonathan Wise  Jonathan Wise:[id number] DOB: 1945-09-16 DOA: 03/05/2020 PCP: [name], MD   Chief complaint: Acute respiratory failure secondary to CHF and pneumonia.   Brief Narrative:  Jonathan Wise is a 74 y.o. male with medical history significant for paroxysmal A. fib on Eliquis, chronic diastolic CHF, chronic respiratory failure on 3 L home oxygen, DVT, hypertension, hyperlipidemia, obesity (BMI 36.94), sleep apnea, insulin-dependent type 2 diabetes, venous stasis ulcers presenting to the ED via EMS for evaluation of lethargy and somnolence.  Patient was unable to provide history due to being intubated and sedated.  History was obtained from ED physician, per report, patient presented with 3-day onset of generalized weakness and within last 24 hours, he was more somnolent and not talking, so EMS was activated, on arrival of EMS patient was noted to have O2 sat in the 70s, patient was noted to be obtunded on arrival to the ED.  ED Course:  In the emergency department, she was noted to be hypothermic with a temperature of 95.40F, tachypneic, tachycardic, BP 180/163 and was on BiPAP with O2 sat of 97%.  Work-up in the ED showed leukocytosis, hyperglycemia, BNP 190, respiratory panel for influenza A, B and SARS coronavirus 2 was negative, troponin I 19 > 20, VBG showed pH 7.159, PCO2 > 120, PO2 68.4, O2 sat 89.4% at FiO2 of 40%.  Lactic acid was within normal range and urinalysis was unimpressive for UTI. Chest x-ray showed cardiomegaly with findings of CHF and small bilateral pleural effusions.  Bibasilar atelectasis versus infiltrate noted. Per ED physician, family was unable to determine patient's CODE STATUS and due to patient's obtunded state, he was intubated pending family's decision. He was treated with IV Vanco and ceftriaxone, he was initially started on IV propofol for sedation, but this was changed to IV fentanyl due to hypotension.  Hospitalist was asked to admit  patient for further evaluation and management.   Assessment & Plan: 1-acute respiratory failure with hypoxia and hypercapnia -Remains intubated and mechanically ventilated -Continue antibiotics for pneumonia -Follow final culture results. -Repeat chest x-ray in a.m. -Patient is status post extubation and diuresing well -Continue IV diuresis. -Per pulmonology recommendations will use as needed BiPAP at night.  2-acute metabolic encephalopathy -Multifactorial: Including hypoxia, hypercapnia and infection. -Significantly improve and patient mentation back to baseline. -Continue to follow clinical response.  3-Hyperglycemia due to diabetes mellitus (HCC) -Continue sliding scale insulin -Follow CBGs and further adjust hypoglycemic regimen as required.  4-mild troponin elevation -In the setting of demand ischemia from CHF exacerbation, sepsis and regular rate/rhythm. -No acute ischemic changes -Continue telemetry monitoring. -There was no complaint of chest pain prior to come to the hospital.  5-history of atrial fibrillation/DVT -Rate continues to fluctuate and at times become difficult to control -Continue amiodarone drip -Will resume Eliquis now that he is tolerating by mouth. -Continue beta-blocker.  6-acute on chronic diastolic heart failure -Continue IV diuresis. -Follow daily weights and strict intake and output -2D echo demonstrating grade 2 diastolic dysfunction; preserved ejection fraction and No wall motion normalities. -So far diuresing well, with a stable renal function.  7-sepsis rule in and present on admission. -Patient with hypothermia, tachypnea and leukocytosis on presentation. -Chest x-ray suggesting source of infection with positive infiltrates. 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 -Continue supportive care. -flutter valve requested.  8-DNR -After extensive  discussion with patient he would like to be DNR/DNI -  Palliative care has been consulted for further discussions about goals of care and advance care planning.   DVT prophylaxis: Lovenox Code Status: Full code. Family Communication: No family at bedside. Disposition:   Status is: Inpatient  Dispo: The patient is from: Home              Anticipated d/c is to: To be determined              Anticipated d/c date is: To be determined              Patient currently no medically ready for discharge; patient still with signs of fluid overload and feeling short of breath.  Successfully extubated on 03/08/2020.  Continue ongoing diuresis.  Continue adjusting electrolytes and follow heart rate status.     Consultants:   PCCM  Palliative care.  Procedures:  See below for x-ray reports Mechanical intubation and ventilatory support 2D echo: 1. Left ventricular ejection fraction, by estimation, is 60 to 65%. The  left ventricle has normal function. The left ventricle has no regional  wall motion abnormalities. There is mild concentric left ventricular  hypertrophy. Left ventricular diastolic  parameters are consistent with Grade II diastolic dysfunction  (pseudonormalization).  2. Right ventricular systolic function is normal. The right ventricular  size is normal.  3. Left atrial size was moderately dilated.  4. Right atrial size was mildly dilated.  5. The mitral valve is degenerative. No evidence of mitral valve  regurgitation.  6. The aortic valve was not well visualized. There is moderate  calcification of the aortic valve. There is moderate thickening of the  aortic valve. Aortic valve regurgitation is not visualized. Moderate  aortic valve stenosis.   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  Patient successfully extubated on  03/08/2020.  No fever.  Still short of breath with minimal activity and complaining of orthopnea.  Objective: Vitals:   03/09/20 1100 03/09/20 1134 03/09/20 1200 03/09/20 1300  BP:      Pulse: (!) 39 (!) 145 78 73  Resp: 15 (!) 22 (!) 26 20  Temp: 97.9 F (36.6 C) 97.9 F (36.6 C) 97.9 F (36.6 C) 98.1 F (36.7 C)  TempSrc:  Bladder    SpO2: 95% 99% 97% 96%  Weight:      Height:        Intake/Output Summary (Last 24 hours) at 03/09/2020 1420 Last data filed at 03/09/2020 1300 Gross per 24 hour  Intake 738.96 ml  Output 850 ml  Net -111.04 ml   Filed Weights   03/07/20 0500 03/08/20 0500 03/09/20 0502  Weight: 130 kg 130.1 kg 123.4 kg    Examination: General exam: Alert, awake, oriented x 3; following commands appropriately denying chest pain.  Patient expresses still difficulty breathing with activity and expressing orthopnea.  Positive fluid overload signs appreciated on his lower extremities and increased abdominal girth. Respiratory system: No wheezing, positive rhonchi, decreased breath sounds at the bases.  No using accessory muscle. Cardiovascular system: Irregular irregular; no rubs, no gallops, soft systolic murmur appreciated on examination. Gastrointestinal system: Abdomen is obese, with increase abdominal girth; no tenderness on palpation and positive bowel sounds.   Central nervous system: Alert and oriented. No focal neurological deficits.  Patient expressed to be generally weak and deconditioned. Extremities: No cyanosis or clubbing; 1+ edema appreciated bilaterally.  Chronic stasis dermatitis changes seen on his legs.  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  Chronic excessive dermatitis as mentioned above.  No open wounds. Psychiatry: Judgement and insight appear normal. Mood & affect appropriate.    Data Reviewed: I have personally reviewed following labs and imaging studies  CBC: Recent Labs  Lab  03/05/20 1844 03/06/20 0416 03/08/20 1317  WBC 14.5* 9.6 20.4*  NEUTROABS 11.7*  --   --   HGB 8.9* 8.1* 9.0*  HCT 37.0* 31.3* 32.2*  MCV 95.4 92.1 83.6  PLT 354 269 302    Basic Metabolic Panel: Recent Labs  Lab 03/05/20 1844 03/06/20 0416 03/08/20 1317  NA 143 141 139  K 5.1 4.0 3.2*  CL 97* 95* 94*  CO2 42* 34* 30  GLUCOSE 169* 73 103*  BUN 21 26* 33*  CREATININE 0.81 0.90 1.04  CALCIUM 9.2 9.0 8.2*  MG  --  1.8  --   PHOS  --  1.0* 3.6    GFR: Estimated Creatinine Clearance: 85.8 mL/min (by C-G formula based on SCr of 1.04 mg/dL).  Liver Function Tests: Recent Labs  Lab 03/05/20 1844 03/06/20 0416 03/08/20 1317  AST 15 16 24   ALT 7 8 11   ALKPHOS 66 53 65  BILITOT 0.9 1.2 1.5*  PROT 7.2 6.1* 6.0*  ALBUMIN 3.2* 2.8* 2.3*    CBG: Recent Labs  Lab 03/08/20 2022 03/09/20 0000 03/09/20 0431 03/09/20 0729 03/09/20 1135  GLUCAP 157* 137* 107* 107* 121*     Recent Results (from the past 240 hour(s))  Resp Panel by RT-PCR (Flu A&B, Covid) Nasopharyngeal Swab     Status: None   Collection Time: 03/05/20  6:40 PM   Specimen: Nasopharyngeal Swab; Nasopharyngeal(NP) swabs in vial transport medium  Result Value Ref Range Status   SARS Coronavirus 2 by RT PCR NEGATIVE NEGATIVE Final    Comment: (NOTE) SARS-CoV-2 target nucleic acids are NOT DETECTED.  The SARS-CoV-2 RNA is generally detectable in upper respiratory specimens during the acute phase of infection. The lowest concentration of SARS-CoV-2 viral copies this assay can detect is 138 copies/mL. A negative result does not preclude SARS-Cov-2 infection and should not be used as the sole basis for treatment or other patient management decisions. A negative result may occur with  improper specimen collection/handling, submission of specimen other than nasopharyngeal swab, presence of viral mutation(s) within the areas targeted by this assay, and inadequate number of viral copies(<138 copies/mL). A  negative result must be combined with clinical observations, patient history, and epidemiological information. The expected result is Negative.  Fact Sheet for Patients:  03/11/20  Fact Sheet for Healthcare Providers:  03/07/20  This test is no t yet approved or cleared by the [employer] FDA and  has been authorized for detection and/or diagnosis of SARS-CoV-2 by FDA under an Emergency Use Authorization (EUA). This EUA will remain  in effect (meaning this test can be used) for the duration of the COVID-19 declaration under Section 564(b)(1) of the Act, 21 U.S.C.section 360bbb-3(b)(1), unless the authorization is terminated  or revoked sooner.       Influenza A by PCR NEGATIVE NEGATIVE Final   Influenza B by PCR NEGATIVE NEGATIVE Final    Comment: (NOTE) The Xpert Xpress SARS-CoV-2/FLU/RSV plus assay is intended as an aid in the diagnosis of influenza from Nasopharyngeal swab specimens and should not be used as a sole basis for treatment. Nasal washings and aspirates are unacceptable for Xpert Xpress SARS-CoV-2/FLU/RSV testing.  Fact Sheet for Patients: Serious[employer]ker[employer]it  Fact Sheet for Healthcare Providers: SeriousBroker.it  This test is not yet approved or cleared by the [place] FDA and has been authorized for detection and/or diagnosis of SARS-CoV-2 by FDA under an Emergency Use Authorization (EUA). This EUA will remain in effect (meaning this test can be used) for the duration of the COVID-19 declaration under Section 564(b)(1) of the Act, 21 U.S.C. section 360bbb-3(b)(1), unless the authorization is terminated or revoked.  Performed at [facility], [address] [place], [place] [address]   Blood culture (routine single)     Status: None (Preliminary result)   Collection Time: 03/05/20  6:44 PM   Specimen: BLOOD LEFT ARM  Result Value Ref  Range Status   Specimen Description BLOOD LEFT ARM  Final   Special Requests   Final    BOTTLES DRAWN AEROBIC AND ANAEROBIC Blood Culture adequate volume   Culture   Final    NO GROWTH 4 DAYS Performed at [facility], [address], [place], [place] [address]    Report Status PENDING  Incomplete  Urine culture     Status: None   Collection Time: 03/05/20  8:12 PM   Specimen: In/Out Cath Urine  Result Value Ref Range Status   Specimen Description   Final    IN/OUT CATH URINE Performed at [facility], [address], [place], [place] [address]    Special Requests   Final    NONE Performed at [facility], [address] [place], [place] [address]    Culture   Final    NO GROWTH Performed at [facility], [address], [place], [place] [address]    Report Status 03/07/2020 FINAL  Final  MRSA PCR Screening     Status: Abnormal   Collection Time: 03/05/20 11:28 PM   Specimen: Nasal Mucosa; Nasopharyngeal  Result Value Ref Range Status   MRSA by PCR POSITIVE (A) NEGATIVE Final    Comment:        The GeneXpert MRSA Assay (FDA approved for NASAL specimens only), is one component of a comprehensive MRSA colonization surveillance program. It is not intended to diagnose MRSA infection nor to guide or monitor treatment for MRSA infections. RESULT CALLED TO, READ BACK BY AND VERIFIED WITH: [name],J @ 0221 ON 03/06/20 BY JUW Performed at [facility], [address], [place], [place] [address]      Radiology Studies: No results found.  Scheduled Meds: . apixaban  5 mg Oral BID  . Chlorhexidine Gluconate Cloth  6 each Topical Q0600  . furosemide  40 mg Intravenous Q12H  . insulin aspart  0-9 Units Subcutaneous TID AC & HS  . ipratropium-albuterol  3 mL Nebulization Once  . metoprolol tartrate  2.5 mg Intravenous Q8H  . mupirocin ointment  1 application Nasal BID   Continuous Infusions: . amiodarone 30 mg/hr (03/09/20 0958)  . cefTRIAXone (ROCEPHIN)  IV Stopped  (03/08/20 1717)  . fentaNYL infusion INTRAVENOUS Stopped (03/05/20 2349)     LOS: 4 days    Time spent: 35 minutes   Vassie Loll, MD [facility]   To contact the attending provider between 7A-7P or the covering provider during after hours 7P-7A, please log into the web site [email or web address] and access using universal  password for that web site. If you do not have the password, please call the hospital operator.  03/09/2020, 2:20 PM

## 2020-03-09 NOTE — Consult Note (Deleted)
NAME:  Jonathan Wise, MRN:  [id number], DOB:  [birth year], LOS: 4 ADMISSION DATE:  03/05/2020, CONSULTATION DATE:  11/29 REFERRING MD:  [place], CHIEF COMPLAINT:  resp failure   Brief History   13 yowm remote smoker with chf/afib/obesity with osa and IDDM admitted 11/26 with hypercarbic/ hypoxemic resp failure and required ET in ER and PCCM asked to see am 11/29.  History of present illness   74 y.o. male with medical history significant for paroxysmal A. fib on Eliquis, chronic diastolic CHF, chronic respiratory failure on 3 L home oxygen, DVT, hypertension, hyperlipidemia, obesity (BMI 36.94), sleep apnea, insulin-dependent type 2 diabetes, venous stasis ulcers presenting to the ED via EMS for evaluation of lethargy and somnolence.  Patient was unable to provide history due to being intubated and sedated.  History was obtained from ED physician, per report, patient presented with 3-day onset of generalized weakness and within last 24 hours, he was more somnolent and not talking, so EMS was activated, on arrival of EMS patient was noted to have O2 sat in the 70s, patient was noted to be obtunded on arrival to the ED.  ED Course:  In the emergency department  noted to be hypothermic with a temperature of 95.30F, tachypneic, tachycardic, BP 180/163 and was on BiPAP with O2 sat of 97%.  Work-up in the ED showed leukocytosis, hyperglycemia, BNP 190, respiratory panel for influenza A, B and SARS coronavirus 2 was negative, troponin I 19 > 20, VBG showed pH 7.159, PCO2 > 120, PO2 68.4, O2 sat 89.4% at FiO2 of 40%.  Lactic acid was within normal range and urinalysis was unimpressive for UTI. Chest x-ray showed cardiomegaly with findings of CHF and small bilateral pleural effusions.  Bibasilar atelectasis versus infiltrate noted. Per ED physician, family was unable to determine patient's CODE STATUS and due to patient's obtunded state, he was intubated pending family's decision. He was treated with  IV Vanco and ceftriaxone, he was initially started on IV propofol for sedation, but this was changed to IV fentanyl due to hypotension.  Hospitalist was asked to admit patient for further evaluation and management.   Past Medical History   Atrial fibrillation (HCC), Bilateral lower extremity edema, Cellulitis, DVT (deep venous thrombosis) (HCC) (1994), Hyperlipidemia, Hypertension, Kidney stones, Morbid obesity (HCC), Pressure ulcer, Sleep apnea, Type 2 diabetes mellitus (HCC), Urinary retention (10/15/2011), and Venous stasis ulcers (HCC).    Significant Hospital Events   PCCM   Consults:  PCCM   Procedures:  Oral ET  11/26  >  11/29   Significant Diagnostic Tests:  Echo 11/27 :  Grade 2 diastolic dysfunction with moderate LAE , mild RAE and moderate AS   Micro Data:  Resp viral panel 11/26 > neg including covd 19 pcr  BC  11/26   Neg UC  11/26  Neg  MRSA PCR  11/26 >  POS   Antimicrobials:  Rocephin 11/26  X one dose Vanc 11/26 >>>  11/29 ? Red man syndrome?  Cefepime 11/26 >>>  11/29 Rocephin 11/29 >>     Scheduled Meds: . apixaban  5 mg Oral BID  . Chlorhexidine Gluconate Cloth  6 each Topical Q0600  . furosemide  40 mg Intravenous Q12H  . insulin aspart  0-9 Units Subcutaneous TID AC & HS  . ipratropium-albuterol  3 mL Nebulization Once  . metoprolol tartrate  2.5 mg Intravenous Q8H  . mupirocin ointment  1 application Nasal BID   Continuous Infusions: . amiodarone 30 mg/hr (03/09/20 0958)  .  cefTRIAXone (ROCEPHIN)  IV Stopped (03/08/20 1717)  . fentaNYL infusion INTRAVENOUS Stopped (03/05/20 2349)   PRN Meds:.   Interim history/subjective:  Comfortable on 2lpm   Objective   Blood pressure (!) 90/52, pulse (!) 145, temperature 97.9 F (36.6 C), temperature source Bladder, resp. rate (!) 22, height 6\' 1"  (1.854 m), weight 123.4 kg, SpO2 99 %.        Intake/Output Summary (Last 24 hours) at 03/09/2020 1324 Last data filed at 03/09/2020 03/11/2020 Gross per 24  hour  Intake 738.96 ml  Output --  Net 738.96 ml   Filed Weights   03/07/20 0500 03/08/20 0500 03/09/20 0502  Weight: 130 kg 130.1 kg 123.4 kg    Examination: Tmax 99.3  Pt alert, approp nad @ 30 degrees  No jvd Oropharynx clear,  mucosa nl Neck supple Lungs with min  rhonchi bilaterally IRIR  no s3 or or sign murmur with pulse 80-90s on amio drip  Abd obese with poor excursion  Extr warm with mild anasarca  Neuro  Sensorium intact ,  no apparent motor deficits         Resolved Hospital Problem list      Assessment & Plan:  1) probably has OHS  secondary to MO assoc with OSA  - bipap can be used prn here but is really not a long term solution   2) CHF with diastolic dysfunction and AS  >>> continue diuresis plus add aldatone once taking po if bp/k/bun/ creat allow   3) bilateral pleural effusions secondary to #2  >>> continue diuresis as above  4) Atrial arrythmia with rate control on amiodarone  >> rx per [facility]/ cards prn   5) Protein calorie malnutrition with low albumin aggravating 3rdspacing  Best practice (evaluated daily)   Diet:  Per [facility]  Pain/Anxiety/Delirium protocol (if indicated): d/cd at extubation 11/29 VAP protocol (if indicated):  DVT prophylaxis: Lovenox GI prophylaxis: ppi Glucose control: per [facility] Mobility: apparently bedridden at homve Communication/goals of care: pt wishes to be DNI > palliative care to see  Code Status: full Disposition: consider floor transfer if NCB/ palliative rx   Labs   CBC: Recent Labs  Lab 03/05/20 1844 03/06/20 0416 03/08/20 1317  WBC 14.5* 9.6 20.4*  NEUTROABS 11.7*  --   --   HGB 8.9* 8.1* 9.0*  HCT 37.0* 31.3* 32.2*  MCV 95.4 92.1 83.6  PLT 354 269 302    Basic Metabolic Panel: Recent Labs  Lab 03/05/20 1844 03/06/20 0416 03/08/20 1317  NA 143 141 139  K 5.1 4.0 3.2*  CL 97* 95* 94*  CO2 42* 34* 30  GLUCOSE 169* 73 103*  BUN 21 26* 33*  CREATININE 0.81 0.90 1.04  CALCIUM 9.2 9.0  8.2*  MG  --  1.8  --   PHOS  --  1.0* 3.6   GFR: Estimated Creatinine Clearance: 85.8 mL/min (by C-G formula based on SCr of 1.04 mg/dL). Recent Labs  Lab 03/05/20 1844 03/05/20 2044 03/06/20 0004 03/06/20 0416 03/07/20 0435 03/08/20 1317  PROCALCITON  --   --  <0.10 0.11 0.17  --   WBC 14.5*  --   --  9.6  --  20.4*  LATICACIDVEN 0.9 1.6  --   --   --   --     Liver Function Tests: Recent Labs  Lab 03/05/20 1844 03/06/20 0416 03/08/20 1317  AST 15 16 24   ALT 7 8 11   ALKPHOS 66 53 65  BILITOT 0.9 1.2 1.5*  PROT 7.2 6.1* 6.0*  ALBUMIN 3.2* 2.8* 2.3*   No results for input(s): LIPASE, AMYLASE in the last 168 hours. No results for input(s): AMMONIA in the last 168 hours.  ABG    Component Value Date/Time   PHART 7.563 (H) 03/07/2020 0717   PCO2ART 40.2 03/07/2020 0717   PO2ART 83.7 03/07/2020 0717   HCO3 36.6 (H) 03/07/2020 0717   TCO2 21 (L) 10/20/2019 0632   ACIDBASEDEF 3.0 (H) 10/20/2019 0632   O2SAT 97.4 03/07/2020 0717     Coagulation Profile: Recent Labs  Lab 03/05/20 1844 03/06/20 0416  INR 1.3* 1.4*    Cardiac Enzymes: No results for input(s): CKTOTAL, CKMB, CKMBINDEX, TROPONINI in the last 168 hours.  HbA1C: Hgb A1c MFr Bld  Date/Time Value Ref Range Status  03/06/2020 04:16 AM 6.1 (H) 4.8 - 5.6 % Final    Comment:    (NOTE) Pre diabetes:          5.7%-6.4%  Diabetes:              >6.4%  Glycemic control for   <7.0% adults with diabetes   12/04/2019 12:00 AM 5.6 4.8 - 5.6 % Final    Comment:    (NOTE) Pre diabetes:          5.7%-6.4%  Diabetes:              >6.4%  Glycemic control for   <7.0% adults with diabetes     CBG: Recent Labs  Lab 03/08/20 2022 03/09/20 0000 03/09/20 0431 03/09/20 0729 03/09/20 1135  GLUCAP 157* 137* 107* 107* 121*      Sandrea Hughs, MD Pulmonary and Critical Care Medicine [facility] Cell [phone]   After 7:00 pm call Elink  [phone]

## 2020-03-09 NOTE — Progress Notes (Signed)
PROGRESS NOTE    Jonathan Wise  GQQ:[id number] DOB: 1945-12-03 DOA: 03/05/2020 PCP: [name], MD   Chief complaint: Acute respiratory failure secondary to CHF and pneumonia.   Brief Narrative:  Jonathan Wise is a 74 y.o. male with medical history significant for paroxysmal A. fib on Eliquis, chronic diastolic CHF, chronic respiratory failure on 3 L home oxygen, DVT, hypertension, hyperlipidemia, obesity (BMI 36.94), sleep apnea, insulin-dependent type 2 diabetes, venous stasis ulcers presenting to the ED via EMS for evaluation of lethargy and somnolence.  Patient was unable to provide history due to being intubated and sedated.  History was obtained from ED physician, per report, patient presented with 3-day onset of generalized weakness and within last 24 hours, he was more somnolent and not talking, so EMS was activated, on arrival of EMS patient was noted to have O2 sat in the 70s, patient was noted to be obtunded on arrival to the ED.  ED Course:  In the emergency department, she was noted to be hypothermic with a temperature of 95.57F, tachypneic, tachycardic, BP 180/163 and was on BiPAP with O2 sat of 97%.  Work-up in the ED showed leukocytosis, hyperglycemia, BNP 190, respiratory panel for influenza A, B and SARS coronavirus 2 was negative, troponin I 19 > 20, VBG showed pH 7.159, PCO2 > 120, PO2 68.4, O2 sat 89.4% at FiO2 of 40%.  Lactic acid was within normal range and urinalysis was unimpressive for UTI. Chest x-ray showed cardiomegaly with findings of CHF and small bilateral pleural effusions.  Bibasilar atelectasis versus infiltrate noted. Per ED physician, family was unable to determine patient's CODE STATUS and due to patient's obtunded state, he was intubated pending family's decision. He was treated with IV Vanco and ceftriaxone, he was initially started on IV propofol for sedation, but this was changed to IV fentanyl due to hypotension.  Hospitalist was asked to admit  patient for further evaluation and management.   Assessment & Plan: 1-acute respiratory failure with hypoxia and hypercapnia -Remains intubated and mechanically ventilated -Continue current antibiotic -Follow culture results. -ABG with a stable CO2 and O2 levels; pH slightly alkalotic in the setting of diuresis and metabolic alkalosis. -Chest x-ray demonstrating bilateral moderate pleural effusion-still findings of mild interstitial pulmonary edema. -Continue IV diuresis.  2-acute metabolic encephalopathy -Multifactorial: Including hypoxia, hypercapnia and infection. -Provide sedation holiday and start SBT's. -Continue to follow clinical response. -Hopefully extubated today. -Appreciate assistance and recommendation by pulmonologist.  3-Hyperglycemia due to diabetes mellitus (HCC) -Continue sliding scale insulin -Follow CBGs every 4 hours while NPO.  4-mild troponin elevation -In the setting of demand ischemia -No acute ischemic changes -Continue telemetry monitoring. -There was no complaint of chest pain prior to come to the hospital.  5-history of atrial fibrillation/DVT -Rate is fluctuating; rate control agents difficult to manage in the setting of sedation and soft blood pressure. -Continue IV beta-blocker; dose adjusted.  IV amiodarone x2 doses provided. -Once able to take by mouth will resume Eliquis for secondary prevention; using now Lovenox acutely for DVT prophylaxis.  6-acute on chronic diastolic heart failure -Continue IV diuresis. -Follow daily weights and strict intake and output -2D echo demonstrating grade 2 diastolic dysfunction; preserved ejection fraction and No wall motion normalities.  7-sepsis rule in and present on admission. -Patient with hypothermia, tachypnea and leukocytosis on presentation. -Chest x-ray suggesting source of infection with positive infiltrates. 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 -Continue supportive care.   DVT prophylaxis: Lovenox Code Status: Full code. Family Communication: No family at bedside. Disposition:   Status is: Inpatient  Dispo: The patient is from: Home              Anticipated d/c is to: To be determined              Anticipated d/c date is: To be determined              Patient currently no medically ready for discharge; patient still with acute respiratory failure requiring ventilatory support.  Continue IV diuresis, follow electrolytes and replete as needed; continue IV antibiotics (narrowing to just Rocephin).     Consultants:   None   Procedures:  See below for x-ray reports Mechanical intubation and ventilatory support 2D echo: 1. Left ventricular ejection fraction, by estimation, is 60 to 65%. The  left ventricle has normal function. The left ventricle has no regional  wall motion abnormalities. There is mild concentric left ventricular  hypertrophy. Left ventricular diastolic  parameters are consistent with Grade II diastolic dysfunction  (pseudonormalization).  2. Right ventricular systolic function is normal. The right ventricular  size is normal.  3. Left atrial size was moderately dilated.  4. Right atrial size was mildly dilated.  5. The mitral valve is degenerative. No evidence of mitral valve  regurgitation.  6. The aortic valve was not well visualized. There is moderate  calcification of the aortic valve. There is moderate thickening of the  aortic valve. Aortic valve regurgitation is not visualized. Moderate  aortic valve stenosis.   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  Patient denies chest pain.  Still intubated and mechanically ventilated.  Following commands and working well with SBT's.  Objective: Vitals:    03/09/20 0700 03/09/20 0729 03/09/20 0800 03/09/20 0900  BP: (!) 60/44  (!) 83/42 103/71  Pulse: 71 67 65 (!) 32  Resp: (!) 24 (!) 25 (!) 21 (!) 25  Temp: (!) 97.2 F (36.2 C) (!) 97.2 F (36.2 C) (!) 97.2 F (36.2 C) (!) 97.2 F (36.2 C)  TempSrc:  Bladder    SpO2: 99% 100% 100% 96%  Weight:      Height:        Intake/Output Summary (Last 24 hours) at 03/09/2020 0917 Last data filed at 03/09/2020 0900 Gross per 24 hour  Intake 727.36 ml  Output 1200 ml  Net -472.64 ml   Filed Weights   03/07/20 0500 03/08/20 0500 03/09/20 0502  Weight: 130 kg 130.1 kg 123.4 kg    Examination: General exam: Intubated and mechanically ventilated; following commands and working well with SBT.  Still with decreased breath sounds at the bases but no using accessory muscle. Respiratory system: Fine crackles; no wheezing, positive scattered rhonchi. Cardiovascular system: Irregular, positive systolic murmur, no rubs, no gallops.  Unable to assess JVD with body habitus. Gastrointestinal system: Abdomen is obese, nondistended, soft and nontender. No organomegaly or masses felt. Normal bowel sounds heard. Central nervous system: No focal neurological deficits. Extremities: No cyanosis or clubbing; 1+ edema appreciated bilaterally.  Chronic stasis dermatitis appreciated on his legs. 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 Psychiatry: Mood & affect appropriate.   Data Reviewed: I have personally reviewed following labs and imaging studies  CBC: Recent Labs  Lab 03/05/20 1844 03/06/20 0416 03/08/20 1317  WBC 14.5* 9.6 20.4*  NEUTROABS 11.7*  --   --   HGB 8.9* 8.1* 9.0*  HCT 37.0* 31.3* 32.2*  MCV 95.4 92.1 83.6  PLT 354 269 302    Basic Metabolic Panel: Recent Labs  Lab 03/05/20 1844 03/06/20 0416 03/08/20 1317  NA 143 141 139  K 5.1 4.0 3.2*  CL 97* 95* 94*  CO2 42* 34* 30  GLUCOSE 169* 73  103*  BUN 21 26* 33*  CREATININE 0.81 0.90 1.04  CALCIUM 9.2 9.0 8.2*  MG  --  1.8  --   PHOS  --  1.0* 3.6    GFR: Estimated Creatinine Clearance: 85.8 mL/min (by C-G formula based on SCr of 1.04 mg/dL).  Liver Function Tests: Recent Labs  Lab 03/05/20 1844 03/06/20 0416 03/08/20 1317  AST 15 16 24   ALT 7 8 11   ALKPHOS 66 53 65  BILITOT 0.9 1.2 1.5*  PROT 7.2 6.1* 6.0*  ALBUMIN 3.2* 2.8* 2.3*    CBG: Recent Labs  Lab 03/08/20 1629 03/08/20 2022 03/09/20 0000 03/09/20 0431 03/09/20 0729  GLUCAP 144* 157* 137* 107* 107*     Recent Results (from the past 240 hour(s))  Resp Panel by RT-PCR (Flu A&B, Covid) Nasopharyngeal Swab     Status: None   Collection Time: 03/05/20  6:40 PM   Specimen: Nasopharyngeal Swab; Nasopharyngeal(NP) swabs in vial transport medium  Result Value Ref Range Status   SARS Coronavirus 2 by RT PCR NEGATIVE NEGATIVE Final    Comment: (NOTE) SARS-CoV-2 target nucleic acids are NOT DETECTED.  The SARS-CoV-2 RNA is generally detectable in upper respiratory specimens during the acute phase of infection. The lowest concentration of SARS-CoV-2 viral copies this assay can detect is 138 copies/mL. A negative result does not preclude SARS-Cov-2 infection and should not be used as the sole basis for treatment or other patient management decisions. A negative result may occur with  improper specimen collection/handling, submission of specimen other than nasopharyngeal swab, presence of viral mutation(s) within the areas targeted by this assay, and inadequate number of viral copies(<138 copies/mL). A negative result must be combined with clinical observations, patient history, and epidemiological information. The expected result is Negative.  Fact Sheet for Patients:  03/11/20  Fact Sheet for Healthcare Providers:  03/07/20  This test is no t yet approved or cleared by the [employer] FDA and  has been authorized for detection and/or diagnosis of SARS-CoV-2 by FDA under an Emergency Use Authorization (EUA). This EUA will remain  in effect (meaning this test can be used) for the duration of the COVID-19 declaration under Section 564(b)(1) of the Act, 21 U.S.C.section 360bbb-3(b)(1), unless the authorization is terminated  or revoked sooner.       Influenza A by PCR NEGATIVE NEGATIVE Final   Influenza B by PCR NEGATIVE NEGATIVE Final    Comment: (NOTE) The Xpert Xpress SARS-CoV-2/FLU/RSV plus assay is intended as an aid in the diagnosis of influenza from Nasopharyngeal swab specimens and should not be used as a sole basis for treatment. Nasal washings and aspirates are unacceptable for Xpert Xpress SARS-CoV-2/FLU/RSV testing.  Fact Sheet for Patients: [employer]it  Fact Sheet for Healthcare Providers: [place]  This test is not yet approved or cleared by the [employer] FDA and has been authorized for detection and/or diagnosis of SARS-CoV-2 by FDA under an Emergency Use Authorization (EUA). This EUA will remain in effect (meaning this test can be used) for  the duration of the COVID-19 declaration under Section 564(b)(1) of the Act, 21 U.S.C. section 360bbb-3(b)(1), unless the authorization is terminated or revoked.  Performed at [facility], [address] [place], [place] [address]   Blood culture (routine single)     Status: None (Preliminary result)   Collection Time: 03/05/20  6:44 PM   Specimen: BLOOD LEFT ARM  Result Value Ref Range Status   Specimen Description BLOOD LEFT ARM  Final   Special Requests   Final    BOTTLES DRAWN AEROBIC AND ANAEROBIC Blood Culture adequate volume   Culture   Final    NO GROWTH 3 DAYS Performed at [facility], [address], [place], [place] [address]    Report Status PENDING  Incomplete  Urine culture     Status: None   Collection Time:  03/05/20  8:12 PM   Specimen: In/Out Cath Urine  Result Value Ref Range Status   Specimen Description   Final    IN/OUT CATH URINE Performed at [facility], [address] [place], [place] [address]    Special Requests   Final    NONE Performed at [facility], [address], [place], [place] [address]    Culture   Final    NO GROWTH Performed at [facility], [address] [place], [place] [address]    Report Status 03/07/2020 FINAL  Final  MRSA PCR Screening     Status: Abnormal   Collection Time: 03/05/20 11:28 PM   Specimen: Nasal Mucosa; Nasopharyngeal  Result Value Ref Range Status   MRSA by PCR POSITIVE (A) NEGATIVE Final    Comment:        The GeneXpert MRSA Assay (FDA approved for NASAL specimens only), is one component of a comprehensive MRSA colonization surveillance program. It is not intended to diagnose MRSA infection nor to guide or monitor treatment for MRSA infections. RESULT CALLED TO, READ BACK BY AND VERIFIED WITH: [name],J @ 0221 ON 03/06/20 BY JUW Performed at [facility], [address], [place], [place] [address]      Radiology Studies: No results found.  Scheduled Meds: . apixaban  5 mg Oral BID  . Chlorhexidine Gluconate Cloth  6 each Topical Q0600  . furosemide  40 mg Intravenous Q12H  . insulin aspart  0-9 Units Subcutaneous TID AC & HS  . ipratropium-albuterol  3 mL Nebulization Once  . metoprolol tartrate  2.5 mg Intravenous Q8H  . mupirocin ointment  1 application Nasal BID   Continuous Infusions: . amiodarone 30 mg/hr (03/09/20 0053)  . cefTRIAXone (ROCEPHIN)  IV Stopped (03/08/20 1717)  . fentaNYL infusion INTRAVENOUS Stopped (03/05/20 2349)     LOS: 4 days    Time spent: 35 minutes   Vassie Loll, MD [facility]   To contact the attending provider between 7A-7P or the covering provider during after hours 7P-7A, please log into the web site [email or web address] and access using universal [place] password  for that web site. If you do not have the password, please call the hospital operator.  03/09/2020, 9:17 AM

## 2020-03-09 NOTE — Care Management Important Message (Signed)
Important Message  Patient Details  Name: MC BLOODWORTH MRN: [id number] Date of Birth: 06-18-1945   Medicare Important Message Given:  Yes     Corey Harold 03/09/2020, 2:13 PM

## 2020-03-09 NOTE — Progress Notes (Signed)
NAME:  Jonathan Wise, MRN:  [id number], DOB:  [birth year], LOS: 4 ADMISSION DATE:  03/05/2020, CONSULTATION DATE:  11/29 REFERRING MD:  [place], CHIEF COMPLAINT:  resp failure   Brief History   49 yowm remote smoker with chf/afib/obesity with osa and IDDM admitted 11/26 with hypercarbic/ hypoxemic resp failure and required ET in ER and PCCM asked to see am 11/29.  History of present illness   74 y.o. male with medical history significant for paroxysmal A. fib on Eliquis, chronic diastolic CHF, chronic respiratory failure on 3 L home oxygen, DVT, hypertension, hyperlipidemia, obesity (BMI 36.94), sleep apnea, insulin-dependent type 2 diabetes, venous stasis ulcers presenting to the ED via EMS for evaluation of lethargy and somnolence.  Patient was unable to provide history due to being intubated and sedated.  History was obtained from ED physician, per report, patient presented with 3-day onset of generalized weakness and within last 24 hours, he was more somnolent and not talking, so EMS was activated, on arrival of EMS patient was noted to have O2 sat in the 70s, patient was noted to be obtunded on arrival to the ED.  ED Course:  In the emergency department  noted to be hypothermic with a temperature of 95.8F, tachypneic, tachycardic, BP 180/163 and was on BiPAP with O2 sat of 97%.  Work-up in the ED showed leukocytosis, hyperglycemia, BNP 190, respiratory panel for influenza A, B and SARS coronavirus 2 was negative, troponin I 19 > 20, VBG showed pH 7.159, PCO2 > 120, PO2 68.4, O2 sat 89.4% at FiO2 of 40%.  Lactic acid was within normal range and urinalysis was unimpressive for UTI. Chest x-ray showed cardiomegaly with findings of CHF and small bilateral pleural effusions.  Bibasilar atelectasis versus infiltrate noted. Per ED physician, family was unable to determine patient's CODE STATUS and due to patient's obtunded state, he was intubated pending family's decision. He was treated with  IV Vanco and ceftriaxone, he was initially started on IV propofol for sedation, but this was changed to IV fentanyl due to hypotension.  Hospitalist was asked to admit patient for further evaluation and management.   Past Medical History   Atrial fibrillation (HCC), Bilateral lower extremity edema, Cellulitis, DVT (deep venous thrombosis) (HCC) (1994), Hyperlipidemia, Hypertension, Kidney stones, Morbid obesity (HCC), Pressure ulcer, Sleep apnea, Type 2 diabetes mellitus (HCC), Urinary retention (10/15/2011), and Venous stasis ulcers (HCC).    Significant Hospital Events   PCCM   Consults:  PCCM   Procedures:  Oral ET  11/26  >  11/29   Significant Diagnostic Tests:  Echo 11/27 :  Grade 2 diastolic dysfunction with moderate LAE , mild RAE and moderate AS   Micro Data:  Resp viral panel 11/26 > neg including covd 19 pcr  BC  11/26   Neg UC  11/26  Neg  MRSA PCR  11/26 >  POS   Antimicrobials:  Rocephin 11/26  X one dose Vanc 11/26 >>>  11/29 ? Red man syndrome?  Cefepime 11/26 >>>  11/29 Rocephin 11/29 >>     Scheduled Meds:  apixaban  5 mg Oral BID   Chlorhexidine Gluconate Cloth  6 each Topical Q0600   furosemide  40 mg Intravenous Q12H   insulin aspart  0-9 Units Subcutaneous TID AC & HS   ipratropium-albuterol  3 mL Nebulization Once   metoprolol tartrate  2.5 mg Intravenous Q8H   mupirocin ointment  1 application Nasal BID   Continuous Infusions:  amiodarone 30 mg/hr (03/09/20 0958)  cefTRIAXone (ROCEPHIN)  IV Stopped (03/08/20 1717)   fentaNYL infusion INTRAVENOUS Stopped (03/05/20 2349)   PRN Meds:.   Interim history/subjective:  Comfortable on 2lpm   Objective   Blood pressure (!) 90/52, pulse (!) 145, temperature 97.9 F (36.6 C), temperature source Bladder, resp. rate (!) 22, height 6\' 1"  (1.854 m), weight 123.4 kg, SpO2 99 %.        Intake/Output Summary (Last 24 hours) at 03/09/2020 1335 Last data filed at 03/09/2020 03/11/2020 Gross per 24  hour  Intake 738.96 ml  Output --  Net 738.96 ml   Filed Weights   03/07/20 0500 03/08/20 0500 03/09/20 0502  Weight: 130 kg 130.1 kg 123.4 kg    Examination: Tmax 99.3  Pt alert, approp nad @ 30 degrees  No jvd Oropharynx clear,  mucosa nl Neck supple Lungs with min  rhonchi bilaterally IRIR  no s3 or or sign murmur with pulse 80-90s on amio drip  Abd obese with poor excursion  Extr warm with mild anasarca  Neuro  Sensorium intact ,  no apparent motor deficits         Resolved Hospital Problem list      Assessment & Plan:  1) probably has OHS  secondary to MO assoc with OSA  - bipap can be used prn here but is really not a long term solution   2) CHF with diastolic dysfunction and AS  >>> continue diuresis plus add aldatone once taking po if bp/k/bun/ creat allow   3) bilateral pleural effusions secondary to #2  >>> continue diuresis as above  4) Atrial arrythmia with rate control on amiodarone  >> rx per [facility]/ cards prn   5) Protein calorie malnutrition with low albumin aggravating 3rdspacing  Best practice (evaluated daily)   Diet:  Per [facility]  Pain/Anxiety/Delirium protocol (if indicated): d/cd at extubation 11/29 VAP protocol (if indicated):  DVT prophylaxis: Lovenox GI prophylaxis: ppi Glucose control: per [facility] Mobility: apparently bedridden at homve Communication/goals of care: pt wishes to be DNI > palliative care to see  Code Status: full Disposition: consider floor transfer if NCB/ palliative rx   Labs   CBC: Recent Labs  Lab 03/05/20 1844 03/06/20 0416 03/08/20 1317  WBC 14.5* 9.6 20.4*  NEUTROABS 11.7*  --   --   HGB 8.9* 8.1* 9.0*  HCT 37.0* 31.3* 32.2*  MCV 95.4 92.1 83.6  PLT 354 269 302    Basic Metabolic Panel: Recent Labs  Lab 03/05/20 1844 03/06/20 0416 03/08/20 1317  NA 143 141 139  K 5.1 4.0 3.2*  CL 97* 95* 94*  CO2 42* 34* 30  GLUCOSE 169* 73 103*  BUN 21 26* 33*  CREATININE 0.81 0.90 1.04  CALCIUM 9.2 9.0  8.2*  MG  --  1.8  --   PHOS  --  1.0* 3.6   GFR: Estimated Creatinine Clearance: 85.8 mL/min (by C-G formula based on SCr of 1.04 mg/dL). Recent Labs  Lab 03/05/20 1844 03/05/20 2044 03/06/20 0004 03/06/20 0416 03/07/20 0435 03/08/20 1317  PROCALCITON  --   --  <0.10 0.11 0.17  --   WBC 14.5*  --   --  9.6  --  20.4*  LATICACIDVEN 0.9 1.6  --   --   --   --     Liver Function Tests: Recent Labs  Lab 03/05/20 1844 03/06/20 0416 03/08/20 1317  AST 15 16 24   ALT 7 8 11   ALKPHOS 66 53 65  BILITOT 0.9 1.2 1.5*  PROT 7.2 6.1* 6.0*  ALBUMIN 3.2* 2.8* 2.3*   No results for input(s): LIPASE, AMYLASE in the last 168 hours. No results for input(s): AMMONIA in the last 168 hours.  ABG    Component Value Date/Time   PHART 7.563 (H) 03/07/2020 0717   PCO2ART 40.2 03/07/2020 0717   PO2ART 83.7 03/07/2020 0717   HCO3 36.6 (H) 03/07/2020 0717   TCO2 21 (L) 10/20/2019 0632   ACIDBASEDEF 3.0 (H) 10/20/2019 0632   O2SAT 97.4 03/07/2020 0717     Coagulation Profile: Recent Labs  Lab 03/05/20 1844 03/06/20 0416  INR 1.3* 1.4*    Cardiac Enzymes: No results for input(s): CKTOTAL, CKMB, CKMBINDEX, TROPONINI in the last 168 hours.  HbA1C: Hgb A1c MFr Bld  Date/Time Value Ref Range Status  03/06/2020 04:16 AM 6.1 (H) 4.8 - 5.6 % Final    Comment:    (NOTE) Pre diabetes:          5.7%-6.4%  Diabetes:              >6.4%  Glycemic control for   <7.0% adults with diabetes   12/04/2019 12:00 AM 5.6 4.8 - 5.6 % Final    Comment:    (NOTE) Pre diabetes:          5.7%-6.4%  Diabetes:              >6.4%  Glycemic control for   <7.0% adults with diabetes     CBG: Recent Labs  Lab 03/08/20 2022 03/09/20 0000 03/09/20 0431 03/09/20 0729 03/09/20 1135  GLUCAP 157* 137* 107* 107* 121*      Sandrea Hughs, MD Pulmonary and Critical Care Medicine [facility] Cell [phone]   After 7:00 pm call Elink  [phone]

## 2020-03-10 DIAGNOSIS — Z515 Encounter for palliative care: Secondary | ICD-10-CM

## 2020-03-10 DIAGNOSIS — R0603 Acute respiratory distress: Secondary | ICD-10-CM

## 2020-03-10 DIAGNOSIS — Z66 Do not resuscitate: Secondary | ICD-10-CM | POA: Diagnosis present

## 2020-03-10 DIAGNOSIS — Z7189 Other specified counseling: Secondary | ICD-10-CM

## 2020-03-10 LAB — BASIC METABOLIC PANEL
Anion gap: 10 (ref 5–15)
BUN: 34 mg/dL — ABNORMAL HIGH (ref 8–23)
CO2: 38 mmol/L — ABNORMAL HIGH (ref 22–32)
Calcium: 8 mg/dL — ABNORMAL LOW (ref 8.9–10.3)
Chloride: 91 mmol/L — ABNORMAL LOW (ref 98–111)
Creatinine, Ser: 0.95 mg/dL (ref 0.61–1.24)
GFR, Estimated: >60 mL/min (ref >60)
Glucose, Bld: 121 mg/dL — ABNORMAL HIGH (ref 70–99)
Potassium: 2.5 mmol/L — CL (ref 3.5–5.1)
Sodium: 139 mmol/L (ref 135–145)

## 2020-03-10 LAB — GLUCOSE, CAPILLARY
Glucose-Capillary: 105 mg/dL — ABNORMAL HIGH (ref 70–99)
Glucose-Capillary: 139 mg/dL — ABNORMAL HIGH (ref 70–99)
Glucose-Capillary: 151 mg/dL — ABNORMAL HIGH (ref 70–99)
Glucose-Capillary: 144 mg/dL — ABNORMAL HIGH (ref 70–99)

## 2020-03-10 LAB — CBC
HCT: 32.6 % — ABNORMAL LOW (ref 39.0–52.0)
Hemoglobin: 8.5 g/dL — ABNORMAL LOW (ref 13.0–17.0)
MCH: 23 pg — ABNORMAL LOW (ref 26.0–34.0)
MCHC: 26.1 g/dL — ABNORMAL LOW (ref 30.0–36.0)
MCV: 88.3 fL (ref 80.0–100.0)
Platelets: 290 10*3/uL (ref 150–400)
RBC: 3.69 MIL/uL — ABNORMAL LOW (ref 4.22–5.81)
RDW: 17.6 % — ABNORMAL HIGH (ref 11.5–15.5)
WBC: 15.1 10*3/uL — ABNORMAL HIGH (ref 4.0–10.5)
nRBC: 0 % (ref 0.0–0.2)

## 2020-03-10 LAB — MAGNESIUM: Magnesium: 1.6 mg/dL — ABNORMAL LOW (ref 1.7–2.4)

## 2020-03-10 MED ORDER — POTASSIUM CHLORIDE 10 MEQ/100ML IV SOLN
10.0000 meq | INTRAVENOUS | Status: AC
  Administered 2020-03-10 (×5): 10 meq via INTRAVENOUS
  Filled 2020-03-10 (×6): qty 100

## 2020-03-10 MED ORDER — PRAVASTATIN SODIUM 40 MG PO TABS
40.0000 mg | ORAL_TABLET | Freq: Every day | ORAL | Status: DC
  Administered 2020-03-11 – 2020-03-13 (×3): 40 mg via ORAL
  Filled 2020-03-10 (×3): qty 1

## 2020-03-10 MED ORDER — MAGNESIUM SULFATE 4 GM/100ML IV SOLN
4.0000 g | Freq: Once | INTRAVENOUS | Status: AC
  Administered 2020-03-10: 4 g via INTRAVENOUS
  Filled 2020-03-10: qty 100

## 2020-03-10 MED ORDER — ONDANSETRON HCL 4 MG/2ML IJ SOLN
INTRAMUSCULAR | Status: AC
  Administered 2020-03-10: 4 mg via INTRAVENOUS
  Filled 2020-03-10: qty 2

## 2020-03-10 MED ORDER — IBUPROFEN 100 MG PO CHEW
400.0000 mg | CHEWABLE_TABLET | Freq: Three times a day (TID) | ORAL | Status: DC | PRN
  Filled 2020-03-10: qty 4

## 2020-03-10 MED ORDER — ONDANSETRON HCL 4 MG/2ML IJ SOLN: 4.0000 mg | Freq: Four times a day (QID) | INTRAMUSCULAR | Status: DC | PRN

## 2020-03-10 MED ORDER — METOPROLOL TARTRATE 25 MG PO TABS
12.5000 mg | ORAL_TABLET | Freq: Two times a day (BID) | ORAL | Status: DC
  Administered 2020-03-10 – 2020-03-11 (×3): 12.5 mg via ORAL
  Filled 2020-03-10 (×3): qty 1

## 2020-03-10 NOTE — Consult Note (Signed)
Consultation Note Date: 03/10/2020   Patient Name: Jonathan Wise  DOB: October 22, 1945  MRN: [id number]  Age / Sex: 74 y.o., male  PCP: Christain Sacramento, MD Referring Physician: Murlean Iba, MD  Reason for Consultation: Establishing goals of care  HPI/Patient Profile: 74 y.o. male  with past medical history of A. fib on Eliquis, chronic diastolic CHF, chronic respiratory therapy on 3 L of oxygen at home, hypertension hyperlipidemia sleep apnea insulin-dependent type 2 diabetes, venous stasis ulcers  admitted on 03/05/2020 with respiratory failure requiring intubation.  He was found to be in CHF exacerbation with bilateral pleural effusions and possible bilateral infiltrates.  Was septic and started on empiric antibiotics-possible pneumonia.  Per chart review he has a DNR form on file that was initiated in September 2017, however ED provider did not see this and wife did not mention it and therefore he was provided with resuscitation and intubation.  Palliative medicine consulted for goals of care.  Clinical Assessment and Goals of Care: Reviewed and patient evaluated. Met with patient at bedside.  He tells me he is feeling a bit down, thinking about past events and his future.  He brings up the fact that he had a DNR sitting on his bedside and would have preferred not to have been intubated and resuscitated. We discussed his current quality of life, he shares that he used to be a truck driver and enjoyed traveling but due to his venous stasis ulcer and swollen legs he is now no longer able to walk.  His wife cares for him at home. He is very spiritual and and discussed how he is not afraid of dying. When asked if he was at home and declined again if he would want to come back to the hospital or just remain at home and be comfortable he says he would rather just remain at home and be kept comfortable and supported  through dying. Options of continued aggressive medical care versus transition to comfort were discussed. Hospice was explained and discussed we covered the philosophy of hospice care and services provided by Hospice both at home and residential hospice. Patient's wife arrived at the end of our discussion. At the close of our discussion patient stated that he would wish to stabilize as much as possible while he is here in the hospital, however after that would like to discharge home with hospice and would consider eventually going to the hospice house.  Primary Decision Maker PATIENT    SUMMARY OF RECOMMENDATIONS -DNR -Continue current level of care without escalation -Treat what is treatable, stabilize patient as well as possible -Transition of care referral for hospice services provided at home  Code Status/Advance Care Planning:  DNR  Prognosis:    < 3 months as evidenced by frequent admissions approximately every 2 months and patient's desire for comfort measures once he discharges home with hospice  Discharge Planning: Home with Hospice  Primary Diagnoses: Present on Admission: . Acute on chronic respiratory failure with hypoxia and hypercapnia (HCC) . Atrial  fibrillation with RVR ([place]) . DVT (deep venous thrombosis) ([place]) . Acute on chronic diastolic CHF (congestive heart failure) ([place]) . Elevated troponin . Bilateral pleural effusion . Protein calorie malnutrition ([place])   I have reviewed the medical record, interviewed the patient and family, and examined the patient. The following aspects are pertinent.  Past Medical History:  Diagnosis Date  . Atrial fibrillation ([place])    On Pradaxa  . Bilateral lower extremity edema   . Cellulitis   . DVT (deep venous thrombosis) (HCC) 1994   LLE. Completed tx with coumadin  . Hyperlipidemia   . Hypertension   . Kidney stones   . Morbid obesity ([place])   . Pressure ulcer   . Sleep apnea   . Type 2 diabetes mellitus ([place])     . Urinary retention 10/15/2011  . Venous stasis ulcers (HCC)    Social History   Socioeconomic History  . Marital status: Married    Spouse name: Not on file  . Number of children: Not on file  . Years of education: Not on file  . Highest education level: Not on file  Occupational History  . Not on file  Tobacco Use  . Smoking status: Former Smoker    Packs/day: 3.00    Years: 60.00    Pack years: 180.00    Types: Cigarettes    Quit date: 2006    Years since quitting: 15.9  . Smokeless tobacco: Never Used  Vaping Use  . Vaping Use: Never used  Substance and Sexual Activity  . Alcohol use: No    Alcohol/week: 0.0 standard drinks  . Drug use: No  . Sexual activity: Not on file  Other Topics Concern  . Not on file  Social History Narrative  . Not on file   Social Determinants of Health   Financial Resource Strain:   . Difficulty of Paying Living Expenses: Not on file  Food Insecurity:   . Worried About Charity fundraiser in the Last Year: Not on file  . Ran Out of Food in the Last Year: Not on file  Transportation Needs:   . Lack of Transportation (Medical): Not on file  . Lack of Transportation (Non-Medical): Not on file  Physical Activity:   . Days of Exercise per Week: Not on file  . Minutes of Exercise per Session: Not on file  Stress:   . Feeling of Stress : Not on file  Social Connections:   . Frequency of Communication with Friends and Family: Not on file  . Frequency of Social Gatherings with Friends and Family: Not on file  . Attends Religious Services: Not on file  . Active Member of Clubs or Organizations: Not on file  . Attends Archivist Meetings: Not on file  . Marital Status: Not on file   Scheduled Meds: . (feeding supplement) PROSource Plus  30 mL Oral BID BM  . apixaban  5 mg Oral BID  . Chlorhexidine Gluconate Cloth  6 each Topical Q0600  . feeding supplement  237 mL Oral BID BM  . furosemide  40 mg Intravenous Q12H  . insulin  aspart  0-9 Units Subcutaneous TID AC & HS  . ipratropium-albuterol  3 mL Nebulization Once  . metoprolol tartrate  2.5 mg Intravenous Q8H  . multivitamin with minerals  1 tablet Oral Daily  . mupirocin ointment  1 application Nasal BID  . ondansetron       Continuous Infusions: . amiodarone 30 mg/hr (03/10/20 0600)  .  cefTRIAXone (ROCEPHIN)  IV Stopped (03/09/20 1734)  . fentaNYL infusion INTRAVENOUS Stopped (03/05/20 2349)  . magnesium sulfate bolus IVPB    . potassium chloride     PRN Meds:.ondansetron (ZOFRAN) IV Medications Prior to Admission:  Prior to Admission medications   Medication Sig Start Date End Date Taking? Authorizing Provider  apixaban (ELIQUIS) 5 MG TABS tablet Take 1 tablet (5 mg total) by mouth 2 (two) times daily. 07/22/17  Yes Kayleen Memos, DO  metFORMIN (GLUCOPHAGE) 500 MG tablet Take 500 mg by mouth 2 (two) times daily with a meal.   Yes [provider]  metoprolol tartrate (LOPRESSOR) 25 MG tablet Take 0.5 tablets (12.5 mg total) by mouth 2 (two) times daily. 11/03/19  Yes Shelly Coss, MD  pravastatin (PRAVACHOL) 40 MG tablet Take 40 mg by mouth daily.   Yes [provider]  TRESIBA FLEXTOUCH 100 UNIT/ML FlexTouch Pen Inject 26 Units into the skin daily. 10/03/19  Yes [provider]  digoxin (LANOXIN) 0.125 MG tablet Take 1 tablet by mouth daily at 8 pm. 11/13/19   [provider]  furosemide (LASIX) 40 MG tablet Take 1 tablet (40 mg total) by mouth daily. Patient not taking: Reported on 03/08/2020 12/07/19 01/06/20  Aline August, MD  hydrocerin (EUCERIN) CREA Apply 1 application topically 2 (two) times daily. Patient not taking: Reported on 03/08/2020 05/19/19   Kathie Dike, MD  lactulose (CHRONULAC) 10 GM/15ML solution Take 15 mLs (10 g total) by mouth 2 (two) times daily. Patient not taking: Reported on 12/31/2019 12/07/19   Aline August, MD  polyethylene glycol (MIRALAX / GLYCOLAX) 17 g packet Take 17 g by mouth  daily as needed for moderate constipation. Patient not taking: Reported on 12/31/2019 11/03/19   Shelly Coss, MD  tamsulosin (FLOMAX) 0.4 MG CAPS capsule Take 1 capsule (0.4 mg total) by mouth daily. Patient not taking: Reported on 03/08/2020 11/03/19   Shelly Coss, MD  zinc sulfate 220 (50 Zn) MG capsule Take by mouth.    [provider]   No Known Allergies Review of Systems  Constitutional: Positive for activity change, appetite change and fatigue.  Respiratory: Positive for shortness of breath.     Physical Exam Vitals and nursing note reviewed.  Constitutional:      Appearance: He is ill-appearing.     Comments: frail  Cardiovascular:     Comments: anasarca Pulmonary:     Effort: Pulmonary effort is normal.  Skin:    Coloration: Skin is pale.  Neurological:     Mental Status: He is alert and oriented to person, place, and time.     Vital Signs: BP (!) 88/58   Pulse 98   Temp 97.7 F (36.5 C)   Resp 19   Ht 6' 1"  (1.854 m)   Wt 122.7 kg   SpO2 98%   BMI 35.69 kg/m  Pain Scale: 0-10 POSS *See Group Information*: 2-Acceptable,Slightly drowsy, easily aroused Pain Score: 0-No pain   SpO2: SpO2: 98 % O2 Device:SpO2: 98 % O2 Flow Rate: .O2 Flow Rate (L/min): 2 L/min  IO: Intake/output summary:   Intake/Output Summary (Last 24 hours) at 03/10/2020 1103 Last data filed at 03/10/2020 0600 Gross per 24 hour  Intake 435.89 ml  Output 2450 ml  Net -2014.11 ml    LBM: Last BM Date: 03/08/20 Baseline Weight: Weight: 129.5 kg Most recent weight: Weight: 122.7 kg     Palliative Assessment/Data: PPS: 20%     Thank you for this consult. Palliative medicine  will continue to follow and assist as needed.   Time [id number]  Time Out: 1103 Time Total:76 mins  Greater than 50%  of this time was spent counseling and coordinating care related to the above assessment and plan.  Signed by: Mariana Kaufman, AGNP-C Palliative Medicine    Please contact  Palliative Medicine Team phone at [phone] for questions and concerns.  For individual provider: See Shea Evans

## 2020-03-10 NOTE — TOC Initial Note (Signed)
Transition of Care [facility]) - Initial/Assessment Note   Patient Details  Name: Jonathan Wise MRN: [id number] Date of Birth: 1945/08/15  Transition of Care [facility]) CM/SW Contact:    Ewing Schlein, LCSW Phone Number: 03/10/2020, 2:18 PM  Clinical Narrative: Patient is a 74 year old male who was admitted for acute on chronic respiratory failure with hypoxia and hypercapnia. Readmission checklist completed due to high readmission score. TOC received consult for home hospice services. CSW spoke with patient regarding hospice choices. Patient requested [facility] and asked if a hospice hospital liaison would come out to the hospital to explain services to him. CSW made referral to Cassandra with [facility]. Amy from [facility] to see patient this afternoon around 3pm. TOC to follow.  Expected Discharge Plan: Home w Hospice Care Barriers to Discharge: Hospice Bed not available, Continued Medical Work up  Patient Goals and CMS Choice Patient states their goals for this hospitalization and ongoing recovery are:: Discharge home with hospices CMS Medicare.gov Compare Post Acute Care list provided to:: Patient Choice offered to / list presented to : Patient  Expected Discharge Plan and Services Expected Discharge Plan: Home w Hospice Care In-house Referral: Clinical Social Work, Hospice / Palliative Care Discharge Planning Services: NA Post Acute Care Choice: Hospice Living arrangements for the past 2 months: Single Family Home [facility] Agency: [facility] Date [facility] Agency Contacted: 03/10/20 Time HH Agency Contacted: [phone]  Prior Living Arrangements/Services Living arrangements for the past 2 months: Single Family Home Lives with:: Spouse Patient language and need for interpreter reviewed:: Yes Need for Family Participation in Patient Care: No (Comment) Care giver support system in place?: Yes (comment) Current home services: DME Criminal Activity/Legal Involvement  Pertinent to Current Situation/Hospitalization: No - Comment as needed  Activities of Daily Living ADL Screening (condition at time of admission) Patient's cognitive ability adequate to safely complete daily activities?: No Is the patient deaf or have difficulty hearing?: No Does the patient have difficulty seeing, even when wearing glasses/contacts?: No Does the patient have difficulty concentrating, remembering, or making decisions?: Yes Patient able to express need for assistance with ADLs?: No Does the patient have difficulty dressing or bathing?: Yes Independently performs ADLs?: No Communication: Dependent Is this a change from baseline?: Change from baseline, expected to last >3 days Dressing (OT): Dependent Is this a change from baseline?: Change from baseline, expected to last >3 days Grooming: Dependent Is this a change from baseline?: Change from baseline, expected to last >3 days Feeding: Dependent Is this a change from baseline?: Change from baseline, expected to last <3 days Bathing: Dependent Is this a change from baseline?: Change from baseline, expected to last >3 days Toileting: Independent with device (comment) In/Out Bed: Dependent Is this a change from baseline?: Change from baseline, expected to last >3 days Walks in Home: Dependent Is this a change from baseline?: Change from baseline, expected to last >3 days Does the patient have difficulty walking or climbing stairs?: Yes Weakness of Legs: Both Weakness of Arms/Hands: None  Permission Sought/Granted Permission sought to share information with : Oceanographer granted to share information with : Yes, Verbal Permission Granted Permission granted to share info w AGENCY: [facility]  Emotional Assessment Appearance:: Appears stated age Attitude/Demeanor/Rapport: Engaged Affect (typically observed): Accepting Orientation: : Oriented to Self, Oriented to Place,  Oriented to  Time, Oriented to Situation Alcohol / Substance Use: Not Applicable Psych Involvement: No (comment)  Admission diagnosis:  Acute respiratory failure with hypercapnia (HCC) [J96.02] Patient Active Problem List   Diagnosis Date Noted  . DNR (do not resuscitate) 03/10/2020  . Bilateral pleural effusion 03/08/2020  . Protein calorie malnutrition (HCC) 03/08/2020  . Acute respiratory failure with hypercapnia (HCC) 03/05/2020  . SIRS (systemic inflammatory response syndrome) (HCC) 03/05/2020  . Elevated brain natriuretic peptide (BNP) level 03/05/2020  . Acute on chronic respiratory failure with hypoxia and hypercapnia (HCC) 03/05/2020  . UTI (urinary tract infection) 12/04/2019  . Acute on chronic diastolic CHF (congestive heart failure) (HCC) 12/04/2019  . Elevated troponin 12/04/2019  . Goals of care, counseling/discussion   . Palliative care by specialist   . Severe sepsis with septic shock (HCC) 10/24/2019  . Sepsis secondary to UTI (HCC) 10/24/2019  . Lobar pneumonia (HCC) 10/24/2019  . DVT (deep venous thrombosis) (HCC) 10/24/2019  . Severe sepsis (HCC) 10/19/2019  . Sepsis (HCC) 10/19/2019  . Pressure injury of skin 06/19/2019  . 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 Bedbound 05/14/2019  . Former heavy tobacco smoker 05/14/2019  . Atrial fibrillation with RVR (HCC) 05/13/2019  . Chronic osteoarthritis 01/01/2017  . Lower extremity weakness 01/01/2017  . Acute respiratory failure with hypoxia (HCC) 12/28/2016  . Anemia of chronic disease 12/25/2015  . Candidal intertrigo 12/22/2015  . 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 Bradycardia 03/15/2014  . Lactic acidosis 03/15/2014  . Atrial fibrillation (HCC) 06/23/2012  . PVC's (premature ventricular contractions) 06/23/2012  . Cellulitis of leg, left 06/22/2012  . Sepsis due to pneumonia (HCC) 06/22/2012  . Hyperglycemia due to diabetes mellitus (HCC) 06/22/2012  . Morbid obesity (HCC)  06/22/2012  . Osteoarthritis of right knee 06/22/2012  . Current use of long term anticoagulation 06/22/2012  . Pulmonary artery hypertension (HCC) 10/18/2011  . Bilateral lower extremity edema 10/14/2011  . Hypotension 10/14/2011  . Venous stasis ulcers (HCC) 10/14/2011  . Type 2 diabetes mellitus without complication (HCC) 10/14/2011   PCP:  [name], MD Pharmacy:   [facility] [phone] - [place], [place] - [address] [place] [address] Phone: [phone] Fax: [phone]  Readmission Risk Interventions Readmission Risk Prevention Plan 03/10/2020 12/07/2019 12/04/2019  Transportation Screening Complete - Complete  PCP or Specialist Appt within 5-7 Days - - -  Not Complete comments - - -  PCP or Specialist Appt within 3-5 Days - Complete -  Home Care Screening - - -  Medication Review (RN CM) - - -  HRI or Home Care Consult - - Complete  Social Work Consult for Recovery Care Planning/Counseling - - Complete  Palliative Care Screening - - Not Complete  Palliative Care Screening Not Complete Comments - - Palliative consulted; has not seen him yet  Medication Review Oceanographer) Complete - Complete  HRI or Home Care Consult Complete - -  SW Recovery Care/Counseling Consult Complete - -  Palliative Care Screening Complete - -  Skilled Nursing Facility Not Applicable - -  Some recent data might be hidden

## 2020-03-10 NOTE — Progress Notes (Signed)
PROGRESS NOTE    Jonathan Wise  WCH:[id number] DOB: 1945/12/25 DOA: 03/05/2020 PCP: [name], MD   Chief complaint: Acute respiratory failure secondary to CHF and pneumonia.   Brief Narrative:  Jonathan Wise is a 74 y.o. male with medical history significant for paroxysmal A. fib on Eliquis, chronic diastolic CHF, chronic respiratory failure on 3 L home oxygen, DVT, hypertension, hyperlipidemia, obesity (BMI 36.94), sleep apnea, insulin-dependent type 2 diabetes, venous stasis ulcers presenting to the ED via EMS for evaluation of lethargy and somnolence.  Patient was unable to provide history due to being intubated and sedated.  History was obtained from ED physician, per report, patient presented with 3-day onset of generalized weakness and within last 24 hours, he was more somnolent and not talking, so EMS was activated, on arrival of EMS patient was noted to have O2 sat in the 70s, patient was noted to be obtunded on arrival to the ED.  ED Course:  In the emergency department, she was noted to be hypothermic with a temperature of 95.46F, tachypneic, tachycardic, BP 180/163 and was on BiPAP with O2 sat of 97%.  Work-up in the ED showed leukocytosis, hyperglycemia, BNP 190, respiratory panel for influenza A, B and SARS coronavirus 2 was negative, troponin I 19 > 20, VBG showed pH 7.159, PCO2 > 120, PO2 68.4, O2 sat 89.4% at FiO2 of 40%.  Lactic acid was within normal range and urinalysis was unimpressive for UTI. Chest x-ray showed cardiomegaly with findings of CHF and small bilateral pleural effusions.  Bibasilar atelectasis versus infiltrate noted. Per ED physician, family was unable to determine patient's CODE STATUS and due to patient's obtunded state, he was intubated pending family's decision. He was treated with IV Vanco and ceftriaxone, he was initially started on IV propofol for sedation, but this was changed to IV fentanyl due to hypotension.  Hospitalist was asked to admit  patient for further evaluation and management.   Assessment & Plan: 1-acute respiratory failure with hypoxia and hypercapnia -He is now s/p being intubated and mechanically ventilated -Continue antibiotics for pneumonia -Follow final culture results. -Patient is status post extubation and diuresing well -Continue diuresis. -Per pulmonology recommendations will use as needed BiPAP at night.  2-acute metabolic encephalopathy - improving -Multifactorial: Including hypoxia, hypercapnia and infection. -Significantly improve and patient mentation back to baseline. -Continue to follow clinical response.  3-Hyperglycemia due to diabetes mellitus -Continue sliding scale insulin -Follow CBGs and further adjust hypoglycemic regimen as required. CBG (last 3)  Recent Labs    03/10/20 0817 03/10/20 1144 03/10/20 1637  GLUCAP 105* 139* 151*    4-mild troponin elevation -In the setting of demand ischemia from CHF exacerbation, sepsis and regular rate/rhythm. -No acute ischemic changes -Continue telemetry monitoring. -There was no complaint of chest pain prior to come to the hospital.  5-history of atrial fibrillation/DVT -Rate continues to fluctuate and at times become difficult to control -Continue amiodarone plan to transition to oral 12/2 -continue Eliquis for full anticoagulation. -Continue beta-blocker.  6-acute on chronic diastolic heart failure -Continue IV diuresis. Transition to oral in 1-2 days -Follow daily weights and strict intake and output -2D echo demonstrating grade 2 diastolic dysfunction; preserved ejection fraction and No wall motion normalities. -So far diuresing well, with a stable renal function.  7-sepsis rule in and present on admission. -Patient with hypothermia, tachypnea and leukocytosis on presentation. -Chest x-ray suggesting source of infection with positive infiltrates. 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 -Continue supportive care. -continue flutter valve.  8-DNR -After extensive discussion with patient he would like to be DNR/DNI -Palliative care has been consulted and following.   Hypokalemia/ Hypomagnesemia - IV replacement ordered, recheck in AM.    DVT prophylaxis: Lovenox Code Status: DNR  Family Communication: palliative care discussion with family today Disposition:   Status is: Inpatient  Dispo: The patient is from: Home              Anticipated d/c is to: To be determined              Anticipated d/c date is: To be determined              Patient currently not medically ready for discharge;   Consultants:   PCCM  Palliative care.  Procedures:  See below for x-ray reports Mechanical intubation and ventilatory support 2D echo: 1. Left ventricular ejection fraction, by estimation, is 60 to 65%. The  left ventricle has normal function. The left ventricle has no regional  wall motion abnormalities. There is mild concentric left ventricular  hypertrophy. Left ventricular diastolic  parameters are consistent with Grade II diastolic dysfunction  (pseudonormalization).  2. Right ventricular systolic function is normal. The right ventricular  size is normal.  3. Left atrial size was moderately dilated.  4. Right atrial size was mildly dilated.  5. The mitral valve is degenerative. No evidence of mitral valve  regurgitation.  6. The aortic valve was not well visualized. There is moderate  calcification of the aortic valve. There is moderate thickening of the  aortic valve. Aortic valve regurgitation is not visualized. Moderate  aortic valve stenosis.   Antimicrobials:  Vancomycin and cefepime 11/26>>>11/29 Rocephin 11/29   Subjective: Pt reports that overall he feels like he is breathing better.   Objective: Vitals:   03/10/20 1100 03/10/20 1200 03/10/20 1300 03/10/20 1500  BP: (!) 94/43 (!) 106/47 108/66    Pulse: 99 80 96   Resp: 18 16 20    Temp: 98.1 F (36.7 C) 98.2 F (36.8 C) 98.1 F (36.7 C)   TempSrc:    Core  SpO2: 98% 98% 99%   Weight:      Height:        Intake/Output Summary (Last 24 hours) at 03/10/2020 1745 Last data filed at 03/10/2020 1644 Gross per 24 hour  Intake 268.94 ml  Output 2950 ml  Net -2681.06 ml   Filed Weights   03/08/20 0500 03/09/20 0502 03/10/20 0500  Weight: 130.1 kg 123.4 kg 122.7 kg    Examination: General exam: Pt appears chronically ill. He is awake and alert.  Respiratory system: crackles Bibasilar, no wheeze or rales heard.  Cardiovascular system: normal s1, s2 sounds.  Gastrointestinal system: morbidly obese, soft, nondistended, normal BS.14/01/21   Central nervous system: no gross focal findings.  Extremities: 1+ edema BLEs.  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  Psychiatry: Judgement and insight appear normal. Mood & affect appropriate.   Data Reviewed: I have personally reviewed following labs and imaging studies  CBC: Recent Labs  Lab 03/05/20 1844 03/06/20 0416 03/08/20 1317 03/10/20 0509  WBC 14.5* 9.6 20.4* 15.1*  NEUTROABS 11.7*  --   --   --   HGB 8.9* 8.1* 9.0* 8.5*  HCT 37.0* 31.3* 32.2* 32.6*  MCV 95.4 92.1 83.6 88.3  PLT 354 269 302 290    Basic Metabolic Panel: Recent Labs  Lab 03/05/20 1844 03/06/20 0416 03/08/20 1317 03/10/20 0509  NA 143 141 139 139  K 5.1 4.0 3.2* 2.5*  CL 97* 95* 94* 91*  CO2 42* 34* 30 38*  GLUCOSE 169* 73 103* 121*  BUN 21 26* 33* 34*  CREATININE 0.81 0.90 1.04 0.95  CALCIUM 9.2 9.0 8.2* 8.0*  MG  --  1.8  --  1.6*  PHOS  --  1.0* 3.6  --     GFR: Estimated Creatinine Clearance: 93.6 mL/min (by C-G formula based on SCr of 0.95 mg/dL).  Liver Function Tests: Recent Labs  Lab 03/05/20 1844 03/06/20 0416 03/08/20 1317  AST 15 16 24   ALT 7 8 11   ALKPHOS 66 53 65  BILITOT 0.9 1.2 1.5*  PROT 7.2 6.1* 6.0*  ALBUMIN 3.2* 2.8* 2.3*    CBG: Recent  Labs  Lab 03/09/20 1759 03/09/20 2059 03/10/20 0817 03/10/20 1144 03/10/20 1637  GLUCAP 190* 106* 105* 139* 151*     Recent Results (from the past 240 hour(s))  Resp Panel by RT-PCR (Flu A&B, Covid) Nasopharyngeal Swab     Status: None   Collection Time: 03/05/20  6:40 PM   Specimen: Nasopharyngeal Swab; Nasopharyngeal(NP) swabs in vial transport medium  Result Value Ref Range Status   SARS Coronavirus 2 by RT PCR NEGATIVE NEGATIVE Final    Comment: (NOTE) SARS-CoV-2 target nucleic acids are NOT DETECTED.  The SARS-CoV-2 RNA is generally detectable in upper respiratory specimens during the acute phase of infection. The lowest concentration of SARS-CoV-2 viral copies this assay can detect is 138 copies/mL. A negative result does not preclude SARS-Cov-2 infection and should not be used as the sole basis for treatment or other patient management decisions. A negative result may occur with  improper specimen collection/handling, submission of specimen other than nasopharyngeal swab, presence of viral mutation(s) within the areas targeted by this assay, and inadequate number of viral copies(<138 copies/mL). A negative result must be combined with clinical observations, patient history, and epidemiological information. The expected result is Negative.  Fact Sheet for Patients:  BloggerCourse.comhttps://www.fda.gov/media/152166/download  Fact Sheet for Healthcare Providers:  SeriousBroker.ithttps://www.fda.gov/media/152162/download  This test is no t yet approved or cleared by the [place] FDA and  has been authorized for detection and/or diagnosis of SARS-CoV-2 by FDA under an Emergency Use Authorization (EUA). This EUA will remain  in effect (meaning this test can be used) for the duration of the COVID-19 declaration under Section 564(b)(1) of the Act, 21 U.S.C.section 360bbb-3(b)(1), unless the authorization is terminated  or revoked sooner.       Influenza A by PCR NEGATIVE NEGATIVE Final    Influenza B by PCR NEGATIVE NEGATIVE Final    Comment: (NOTE) The Xpert Xpress SARS-CoV-2/FLU/RSV plus assay is intended as an aid in the diagnosis of influenza from Nasopharyngeal swab specimens and should not be used as a sole basis for treatment. Nasal washings and aspirates are unacceptable for Xpert Xpress SARS-CoV-2/FLU/RSV testing.  Fact Sheet for Patients: BloggerCourse.comhttps://www.fda.gov/media/152166/download  Fact Sheet for Healthcare Providers: SeriousBroker.ithttps://www.fda.gov/media/152162/download  This test is not yet approved or cleared by the [place] FDA and has been authorized for detection and/or diagnosis of SARS-CoV-2 by FDA under an Emergency Use Authorization (EUA). This EUA will remain in effect (meaning this test can be used) for the duration of the COVID-19 declaration under Section 564(b)(1) of the Act, 21 U.S.C. section 360bbb-3(b)(1), unless the authorization is terminated or revoked.  Performed at [facility], [address] [place], [place] [address]  Blood culture (routine single)     Status: None (Preliminary result)   Collection Time: 03/05/20  6:44 PM   Specimen: BLOOD LEFT ARM  Result Value Ref Range Status   Specimen Description BLOOD LEFT ARM  Final   Special Requests   Final    BOTTLES DRAWN AEROBIC AND ANAEROBIC Blood Culture adequate volume   Culture   Final    NO GROWTH 4 DAYS Performed at [facility], [address] [place], [place] [address]    Report Status PENDING  Incomplete  Urine culture     Status: None   Collection Time: 03/05/20  8:12 PM   Specimen: In/Out Cath Urine  Result Value Ref Range Status   Specimen Description   Final    IN/OUT CATH URINE Performed at [facility], [address], [place], [place] [address]    Special Requests   Final    NONE Performed at [facility], [address], [place], [place] [address]    Culture   Final    NO GROWTH Performed at [facility], [address], [place], [place] [address]     Report Status 03/07/2020 FINAL  Final  MRSA PCR Screening     Status: Abnormal   Collection Time: 03/05/20 11:28 PM   Specimen: Nasal Mucosa; Nasopharyngeal  Result Value Ref Range Status   MRSA by PCR POSITIVE (A) NEGATIVE Final    Comment:        The GeneXpert MRSA Assay (FDA approved for NASAL specimens only), is one component of a comprehensive MRSA colonization surveillance program. It is not intended to diagnose MRSA infection nor to guide or monitor treatment for MRSA infections. RESULT CALLED TO, READ BACK BY AND VERIFIED WITH: [name],J @ 0221 ON 03/06/20 BY JUW Performed at [facility], [address] [place], [place] [address]      Radiology Studies: DG CHEST PORT 1 VIEW  Result Date: 03/09/2020 CLINICAL DATA:  Generalized weakness. EXAM: PORTABLE CHEST 1 VIEW COMPARISON:  March 07, 2020. FINDINGS: Stable cardiomediastinal silhouette. No pneumothorax is noted. Stable bilateral pleural effusions are noted with bibasilar atelectasis. Bony thorax is unremarkable. IMPRESSION: Stable bilateral pleural effusions with bibasilar atelectasis. Electronically Signed   By: Lupita Raider M.D.   On: 03/09/2020 17:24    Scheduled Meds: . (feeding supplement) PROSource Plus  30 mL Oral BID BM  . apixaban  5 mg Oral BID  . Chlorhexidine Gluconate Cloth  6 each Topical Q0600  . feeding supplement  237 mL Oral BID BM  . furosemide  40 mg Intravenous Q12H  . insulin aspart  0-9 Units Subcutaneous TID AC & HS  . ipratropium-albuterol  3 mL Nebulization Once  . metoprolol tartrate  2.5 mg Intravenous Q8H  . multivitamin with minerals  1 tablet Oral Daily  . mupirocin ointment  1 application Nasal BID   Continuous Infusions: . amiodarone 30 mg/hr (03/10/20 0600)  . cefTRIAXone (ROCEPHIN)  IV 1 g (03/10/20 1634)  . fentaNYL infusion INTRAVENOUS Stopped (03/05/20 2349)     LOS: 5 days    Critical Care Procedure Note Authorized and Performed by: Maryln Manuel MD  Total  Critical Care time:  40 mins Due to a high probability of clinically significant, life threatening deterioration, the patient required my highest level of preparedness to intervene emergently and I personally spent this critical care time directly and personally managing the patient.  This critical care time included obtaining a history; examining the patient, pulse oximetry; ordering  and review of studies; arranging urgent treatment with development of a management plan; evaluation of patient's response of treatment; frequent reassessment; and discussions with other providers.  This critical care time was performed to assess and manage the high probability of imminent and life threatening deterioration that could result in multi-organ failure.  It was exclusive of separately billable procedures and treating other patients and teaching time.     Standley Dakins, MD How to contact the [facility] Attending or Consulting provider 7A - 7P or covering provider during after hours 7P -7A, for this patient?  1. Check the care team in [facility] and look for a) attending/consulting TR[facility] provider listed and b) the [facility] team listed 2. Log into [email or web address] and use [place]'s universal password to access. If you do not have the password, please contact the hospital operator. 3. Locate the [facility] provider you are looking for under [facility] and page to a number that you can be directly reached. 4. If you still have difficulty reaching the provider, please page the [facility] (Director on Call) for the Hospitalists listed on amion for assistance.   03/10/2020, 5:45 PM

## 2020-03-11 LAB — COMPREHENSIVE METABOLIC PANEL
ALT: 18 U/L (ref 0–44)
AST: 36 U/L (ref 15–41)
Albumin: 2.3 g/dL — ABNORMAL LOW (ref 3.5–5.0)
Alkaline Phosphatase: 82 U/L (ref 38–126)
Anion gap: 7 (ref 5–15)
BUN: 29 mg/dL — ABNORMAL HIGH (ref 8–23)
CO2: 42 mmol/L — ABNORMAL HIGH (ref 22–32)
Calcium: 8 mg/dL — ABNORMAL LOW (ref 8.9–10.3)
Chloride: 88 mmol/L — ABNORMAL LOW (ref 98–111)
Creatinine, Ser: 0.81 mg/dL (ref 0.61–1.24)
GFR, Estimated: >60 mL/min (ref >60)
Glucose, Bld: 141 mg/dL — ABNORMAL HIGH (ref 70–99)
Potassium: 2.8 mmol/L — ABNORMAL LOW (ref 3.5–5.1)
Sodium: 137 mmol/L (ref 135–145)
Total Bilirubin: 0.5 mg/dL (ref 0.3–1.2)
Total Protein: 5.9 g/dL — ABNORMAL LOW (ref 6.5–8.1)

## 2020-03-11 LAB — CULTURE, BLOOD (SINGLE)
Culture: NO GROWTH
Special Requests: ADEQUATE

## 2020-03-11 LAB — CBC
HCT: 29.8 % — ABNORMAL LOW (ref 39.0–52.0)
Hemoglobin: 8 g/dL — ABNORMAL LOW (ref 13.0–17.0)
MCH: 23.2 pg — ABNORMAL LOW (ref 26.0–34.0)
MCHC: 26.8 g/dL — ABNORMAL LOW (ref 30.0–36.0)
MCV: 86.4 fL (ref 80.0–100.0)
Platelets: 263 10*3/uL (ref 150–400)
RBC: 3.45 MIL/uL — ABNORMAL LOW (ref 4.22–5.81)
RDW: 17.5 % — ABNORMAL HIGH (ref 11.5–15.5)
WBC: 12 10*3/uL — ABNORMAL HIGH (ref 4.0–10.5)
nRBC: 0 % (ref 0.0–0.2)

## 2020-03-11 LAB — GLUCOSE, CAPILLARY
Glucose-Capillary: 118 mg/dL — ABNORMAL HIGH (ref 70–99)
Glucose-Capillary: 135 mg/dL — ABNORMAL HIGH (ref 70–99)
Glucose-Capillary: 144 mg/dL — ABNORMAL HIGH (ref 70–99)
Glucose-Capillary: 188 mg/dL — ABNORMAL HIGH (ref 70–99)

## 2020-03-11 LAB — MAGNESIUM: Magnesium: 2.3 mg/dL (ref 1.7–2.4)

## 2020-03-11 MED ORDER — IBUPROFEN 100 MG/5ML PO SUSP: 400.0000 mg | Freq: Three times a day (TID) | ORAL | Status: DC | PRN

## 2020-03-11 MED ORDER — AMIODARONE HCL 200 MG PO TABS
200.0000 mg | ORAL_TABLET | Freq: Every day | ORAL | Status: DC
  Administered 2020-03-11: 200 mg via ORAL
  Filled 2020-03-11: qty 1

## 2020-03-11 MED ORDER — AMIODARONE HCL 200 MG PO TABS
200.0000 mg | ORAL_TABLET | Freq: Two times a day (BID) | ORAL | Status: DC
  Administered 2020-03-11: 200 mg via ORAL
  Filled 2020-03-11: qty 1

## 2020-03-11 MED ORDER — POTASSIUM CHLORIDE 10 MEQ/100ML IV SOLN
10.0000 meq | INTRAVENOUS | Status: AC
  Administered 2020-03-11 (×4): 10 meq via INTRAVENOUS
  Filled 2020-03-11 (×4): qty 100

## 2020-03-11 MED ORDER — DILTIAZEM HCL 25 MG/5ML IV SOLN
10.0000 mg | Freq: Once | INTRAVENOUS | Status: AC
  Administered 2020-03-11: 10 mg via INTRAVENOUS
  Filled 2020-03-11: qty 5

## 2020-03-11 MED ORDER — CHLORHEXIDINE GLUCONATE CLOTH 2 % EX PADS
6.0000 | MEDICATED_PAD | Freq: Every day | CUTANEOUS | Status: DC
  Administered 2020-03-11 – 2020-03-12 (×2): 6 via TOPICAL

## 2020-03-11 MED ORDER — POTASSIUM CHLORIDE CRYS ER 20 MEQ PO TBCR
40.0000 meq | EXTENDED_RELEASE_TABLET | Freq: Two times a day (BID) | ORAL | Status: DC
  Administered 2020-03-11 – 2020-03-13 (×4): 40 meq via ORAL
  Filled 2020-03-11 (×4): qty 2

## 2020-03-11 MED ORDER — FUROSEMIDE 40 MG PO TABS
60.0000 mg | ORAL_TABLET | Freq: Two times a day (BID) | ORAL | Status: DC
  Administered 2020-03-11 – 2020-03-13 (×5): 60 mg via ORAL
  Filled 2020-03-11 (×5): qty 1

## 2020-03-11 NOTE — Progress Notes (Signed)
PROGRESS NOTE    [place]  JOI:[id number] DOB: June 13, 1945 DOA: 03/05/2020 PCP: [name], MD   Chief complaint: Acute respiratory failure secondary to CHF and pneumonia.  Brief Narrative:  Jonathan [place] is a 74 y.o. male with medical history significant for paroxysmal A. fib on Eliquis, chronic diastolic CHF, chronic respiratory failure on 3 L home oxygen, DVT, hypertension, hyperlipidemia, obesity (BMI 36.94), sleep apnea, insulin-dependent type 2 diabetes, venous stasis ulcers presenting to the ED via EMS for evaluation of lethargy and somnolence.  Patient was unable to provide history due to being intubated and sedated.  History was obtained from ED physician, per report, patient presented with 3-day onset of generalized weakness and within last 24 hours, he was more somnolent and not talking, so EMS was activated, on arrival of EMS patient was noted to have O2 sat in the 70s, patient was noted to be obtunded on arrival to the ED.  ED Course:  In the emergency department, she was noted to be hypothermic with a temperature of 95.68F, tachypneic, tachycardic, BP 180/163 and was on BiPAP with O2 sat of 97%.  Work-up in the ED showed leukocytosis, hyperglycemia, BNP 190, respiratory panel for influenza A, B and SARS coronavirus 2 was negative, troponin I 19 > 20, VBG showed pH 7.159, PCO2 > 120, PO2 68.4, O2 sat 89.4% at FiO2 of 40%.  Lactic acid was within normal range and urinalysis was unimpressive for UTI. Chest x-ray showed cardiomegaly with findings of CHF and small bilateral pleural effusions.  Bibasilar atelectasis versus infiltrate noted. Per ED physician, family was unable to determine patient's CODE STATUS and due to patient's obtunded state, he was intubated pending family's decision. He was treated with IV Vanco and ceftriaxone, he was initially started on IV propofol for sedation, but this was changed to IV fentanyl due to hypotension.  Hospitalist was asked to admit  patient for further evaluation and management.  Assessment & Plan: 1-acute respiratory failure with hypoxia and hypercapnia -He is now s/p being intubated and mechanically ventilated -Continue antibiotics for pneumonia -Follow final culture results. -Patient is status post extubation and diuresing well -Continue diuresis. -Per pulmonology recommendations will use as needed BiPAP at night.  2-acute metabolic encephalopathy - improving -Multifactorial: Including hypoxia, hypercapnia and infection. -Significantly improve and patient mentation back to baseline. -Continue to follow clinical response.  3-Hyperglycemia due to diabetes mellitus -Continue sliding scale insulin -Follow CBGs and further adjust hypoglycemic regimen as required. CBG (last 3)  Recent Labs    03/11/20 0738 03/11/20 1216 03/11/20 1634  GLUCAP 118* 135* 144*    4-mild troponin elevation -In the setting of demand ischemia from CHF exacerbation, sepsis and regular rate/rhythm. -No acute ischemic changes -Continue telemetry monitoring. -There was no complaint of chest pain prior to come to the hospital.  5-history of atrial fibrillation/DVT -Rate continues to fluctuate and at times become difficult to control -Continue amiodarone plan to transition to oral 12/2 -continue Eliquis for full anticoagulation. -Continue beta-blocker.  6-acute on chronic diastolic heart failure -Continue IV diuresis. Transition to oral in 1-2 days -Follow daily weights and strict intake and output -2D echo demonstrating grade 2 diastolic dysfunction; preserved ejection fraction and No wall motion normalities. -So far diuresing well, with a stable renal function.  7-Severe sepsis ruled in and present on admission. -Patient with hypothermia, tachypnea and leukocytosis on presentation. -Chest x-ray suggesting source of infection with positive infiltrates. -Vancomycin has been discontinued in the setting of "red man"  syndrome -  Cefepime discontinued and abx's narrowed just Rocephin. -Continue supportive care. -continue flutter valve.  8-DNR -After extensive discussion with patient he would like to be DNR/DNI -Palliative care has been consulted and following.   Hypokalemia/ Hypomagnesemia - IV replacement ordered, recheck in AM.    DVT prophylaxis: Lovenox Code Status: DNR  Family Communication: palliative care discussion with family today Disposition:   Status is: Inpatient  Dispo: The patient is from: Home              Anticipated d/c is to: To be determined              Anticipated d/c date is: To be determined              Patient currently not medically ready for discharge;   Consultants:   PCCM  Palliative care.  Procedures:  See below for x-ray reports Mechanical intubation and ventilatory support 2D echo: 1. Left ventricular ejection fraction, by estimation, is 60 to 65%. The  left ventricle has normal function. The left ventricle has no regional  wall motion abnormalities. There is mild concentric left ventricular  hypertrophy. Left ventricular diastolic  parameters are consistent with Grade II diastolic dysfunction  (pseudonormalization).  2. Right ventricular systolic function is normal. The right ventricular  size is normal.  3. Left atrial size was moderately dilated.  4. Right atrial size was mildly dilated.  5. The mitral valve is degenerative. No evidence of mitral valve  regurgitation.  6. The aortic valve was not well visualized. There is moderate  calcification of the aortic valve. There is moderate thickening of the  aortic valve. Aortic valve regurgitation is not visualized. Moderate  aortic valve stenosis.   Antimicrobials:  Vancomycin and cefepime 11/26>>>11/29 Rocephin 11/29   Subjective: Pt says that he feels like he is urinating frequently on current regimen. His SOB is improving.    Objective: Vitals:   03/11/20 1200 03/11/20 1219  03/11/20 1300 03/11/20 1359  BP: 111/83  104/60 126/89  Pulse: 84 84 81   Resp: 20  17 18   Temp: 98.1 F (36.7 C)  98.2 F (36.8 C) 98.4 F (36.9 C)  TempSrc:    Oral  SpO2: 98% 95% 90% 100%  Weight:      Height:        Intake/Output Summary (Last 24 hours) at 03/11/2020 1709 Last data filed at 03/11/2020 1030 Gross per 24 hour  Intake 1384.66 ml  Output 1200 ml  Net 184.66 ml   Filed Weights   03/09/20 0502 03/10/20 0500 03/11/20 0330  Weight: 123.4 kg 122.7 kg 121.1 kg    Examination: General exam: Pt appears chronically ill. He is awake and alert.  Respiratory system: crackles Bibasilar, no wheeze or rales heard.  Cardiovascular system: normal s1, s2 sounds.  Gastrointestinal system: morbidly obese, soft, nondistended, normal BS.Marland Kitchen.   Central nervous system: no gross focal findings.  Extremities: 1+ edema BLEs.  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  Psychiatry: Judgement and insight appear normal. Mood & affect appropriate.   Data Reviewed: I have personally reviewed following labs and imaging studies  CBC: Recent Labs  Lab 03/05/20 1844 03/06/20 0416 03/08/20 1317 03/10/20 0509 03/11/20 0314  WBC 14.5* 9.6 20.4* 15.1* 12.0*  NEUTROABS 11.7*  --   --   --   --   HGB 8.9* 8.1* 9.0* 8.5* 8.0*  HCT 37.0* 31.3* 32.2* 32.6* 29.8*  MCV 95.4 92.1 83.6 88.3 86.4  PLT  354 269 302 290 263    Basic Metabolic Panel: Recent Labs  Lab 03/05/20 1844 03/06/20 0416 03/08/20 1317 03/10/20 0509 03/11/20 0314  NA 143 141 139 139 137  K 5.1 4.0 3.2* 2.5* 2.8*  CL 97* 95* 94* 91* 88*  CO2 42* 34* 30 38* 42*  GLUCOSE 169* 73 103* 121* 141*  BUN 21 26* 33* 34* 29*  CREATININE 0.81 0.90 1.04 0.95 0.81  CALCIUM 9.2 9.0 8.2* 8.0* 8.0*  MG  --  1.8  --  1.6* 2.3  PHOS  --  1.0* 3.6  --   --     GFR: Estimated Creatinine Clearance: 109.1 mL/min (by C-G formula based on SCr of 0.81 mg/dL).  Liver Function Tests: Recent Labs  Lab 03/05/20 1844  03/06/20 0416 03/08/20 1317 03/11/20 0314  AST 15 16 24  36  ALT 7 8 11 18   ALKPHOS 66 53 65 82  BILITOT 0.9 1.2 1.5* 0.5  PROT 7.2 6.1* 6.0* 5.9*  ALBUMIN 3.2* 2.8* 2.3* 2.3*    CBG: Recent Labs  Lab 03/10/20 1637 03/10/20 2109 03/11/20 0738 03/11/20 1216 03/11/20 1634  GLUCAP 151* 144* 118* 135* 144*     Recent Results (from the past 240 hour(s))  Resp Panel by RT-PCR (Flu A&B, Covid) Nasopharyngeal Swab     Status: None   Collection Time: 03/05/20  6:40 PM   Specimen: Nasopharyngeal Swab; Nasopharyngeal(NP) swabs in vial transport medium  Result Value Ref Range Status   SARS Coronavirus 2 by RT PCR NEGATIVE NEGATIVE Final    Comment: (NOTE) SARS-CoV-2 target nucleic acids are NOT DETECTED.  The SARS-CoV-2 RNA is generally detectable in upper respiratory specimens during the acute phase of infection. The lowest concentration of SARS-CoV-2 viral copies this assay can detect is 138 copies/mL. A negative result does not preclude SARS-Cov-2 infection and should not be used as the sole basis for treatment or other patient management decisions. A negative result may occur with  improper specimen collection/handling, submission of specimen other than nasopharyngeal swab, presence of viral mutation(s) within the areas targeted by this assay, and inadequate number of viral copies(<138 copies/mL). A negative result must be combined with clinical observations, patient history, and epidemiological information. The expected result is Negative.  Fact Sheet for Patients:  14/02/21  Fact Sheet for Healthcare Providers:  03/07/20  This test is no t yet approved or cleared by the [employer] FDA and  has been authorized for detection and/or diagnosis of SARS-CoV-2 by FDA under an Emergency Use Authorization (EUA). This EUA will remain  in effect (meaning this test can be used) for the duration of the COVID-19  declaration under Section 564(b)(1) of the Act, 21 U.S.C.section 360bbb-3(b)(1), unless the authorization is terminated  or revoked sooner.       Influenza A by PCR NEGATIVE NEGATIVE Final   Influenza B by PCR NEGATIVE NEGATIVE Final    Comment: (NOTE) The Xpert Xpress SARS-CoV-2/FLU/RSV plus assay is intended as an aid in the diagnosis of influenza from Nasopharyngeal swab specimens and should not be used as a sole basis for treatment. Nasal washings and aspirates are unacceptable for Xpert Xpress SARS-CoV-2/FLU/RSV testing.  Fact Sheet for Patients: [employer]ious[employer]ker[employer]it  Fact Sheet for Healthcare Providers: [place]  This test is not yet approved or cleared by the [employer] FDA and has been authorized for detection and/or diagnosis of SARS-CoV-2 by FDA under an Emergency Use Authorization (EUA). This EUA will remain in effect (meaning this test can be used) for  the duration of the COVID-19 declaration under Section 564(b)(1) of the Act, 21 U.S.C. section 360bbb-3(b)(1), unless the authorization is terminated or revoked.  Performed at [facility], [address] [place], [place] [address]   Blood culture (routine single)     Status: None   Collection Time: 03/05/20  6:44 PM   Specimen: BLOOD LEFT ARM  Result Value Ref Range Status   Specimen Description BLOOD LEFT ARM  Final   Special Requests   Final    BOTTLES DRAWN AEROBIC AND ANAEROBIC Blood Culture adequate volume   Culture   Final    NO GROWTH 6 DAYS Performed at [facility], [address], [place], [place] [address]    Report Status 03/11/2020 FINAL  Final  Urine culture     Status: None   Collection Time: 03/05/20  8:12 PM   Specimen: In/Out Cath Urine  Result Value Ref Range Status   Specimen Description   Final    IN/OUT CATH URINE Performed at [facility], [address], [place], [place] [address]    Special Requests   Final    NONE Performed  at [facility], [address] [place], [place] [address]    Culture   Final    NO GROWTH Performed at [facility], [address], [place], [place] [address]    Report Status 03/07/2020 FINAL  Final  MRSA PCR Screening     Status: Abnormal   Collection Time: 03/05/20 11:28 PM   Specimen: Nasal Mucosa; Nasopharyngeal  Result Value Ref Range Status   MRSA by PCR POSITIVE (A) NEGATIVE Final    Comment:        The GeneXpert MRSA Assay (FDA approved for NASAL specimens only), is one component of a comprehensive MRSA colonization surveillance program. It is not intended to diagnose MRSA infection nor to guide or monitor treatment for MRSA infections. RESULT CALLED TO, READ BACK BY AND VERIFIED WITH: [name],J @ 0221 ON 03/06/20 BY JUW Performed at [facility], [address], [place], [place] [address]      Radiology Studies: No results found.  Scheduled Meds: . (feeding supplement) PROSource Plus  30 mL Oral BID BM  . amiodarone  200 mg Oral Daily  . apixaban  5 mg Oral BID  . Chlorhexidine Gluconate Cloth  6 each Topical Daily  . feeding supplement  237 mL Oral BID BM  . furosemide  60 mg Oral BID  . insulin aspart  0-9 Units Subcutaneous TID AC & HS  . metoprolol tartrate  12.5 mg Oral BID  . multivitamin with minerals  1 tablet Oral Daily  . potassium chloride  40 mEq Oral BID  . pravastatin  40 mg Oral Daily   Continuous Infusions: . cefTRIAXone (ROCEPHIN)  IV 1 g (03/11/20 1629)     LOS: 6 days   Time spent: 37 mins  Cashe Gatt Laural Benes, MD How to contact the [facility] Attending or Consulting provider 7A - 7P or covering provider during after hours 7P -7A, for this patient?  1. Check the care team in [facility] and look for a) attending/consulting TRH provider listed and b) the [facility] team listed 2. Log into [email or web address] and use [place]'s universal password to access. If you do not have the password, please contact the hospital operator. 3. Locate the [facility] provider  you are looking for under [facility] and page to a number that you can be directly reached. 4. If you still have difficulty reaching the provider,  please page the [facility] (Director on Call) for the Hospitalists listed on amion for assistance.   03/11/2020, 5:09 PM

## 2020-03-11 NOTE — Progress Notes (Signed)
Patient's heart rate in the 120s-140s, running a-fib. Notified Dr. Laural Benes and received orders to administer Cardizem 10 mg IV push once. Medication administered. Will continue to monitor.

## 2020-03-11 NOTE — TOC Progression Note (Signed)
Transition of Care [facility]) - Progression Note    Patient Details  Name: Jonathan [place] MRN: [id number] Date of Birth: [birth year]  Transition of Care [facility]) CM/SW Contact  Annice Needy, LCSW Phone Number: 03/11/2020, 3:16 PM  Clinical Narrative:    Tad Moore with [facility] confirms receipt of referral for hospice in patient's home once he discharges.    Expected Discharge Plan: Home w Hospice Care Barriers to Discharge: Continued Medical Work up  Expected Discharge Plan and Services Expected Discharge Plan: Home w Hospice Care In-house Referral: Clinical Social Work, Hospice / Palliative Care Discharge Planning Services: NA Post Acute Care Choice: Hospice Living arrangements for the past 2 months: Single Family Home                             [facility] Agency: [facility] Date [facility] Agency Contacted: 03/10/20 Time HH Agency Contacted: [phone]     Social Determinants of Health (SDOH) Interventions    Readmission Risk Interventions Readmission Risk Prevention Plan 03/10/2020 12/07/2019 12/04/2019  Transportation Screening Complete - Complete  PCP or Specialist Appt within 5-7 Days - - -  Not Complete comments - - -  PCP or Specialist Appt within 3-5 Days - Complete -  Home Care Screening - - -  Medication Review (RN CM) - - -  HRI or Home Care Consult - - Complete  Social Work Consult for Recovery Care Planning/Counseling - - Complete  Palliative Care Screening - - Not Complete  Palliative Care Screening Not Complete Comments - - Palliative consulted; has not seen him yet  Medication Review [employer]) Complete - Complete  HRI or Home Care Consult Complete - -  SW Recovery Care/Counseling Consult Complete - -  Palliative Care Screening Complete - -  Skilled Nursing Facility Not Applicable - -  Some recent data might be hidden

## 2020-03-12 LAB — CBC
HCT: 30.9 % — ABNORMAL LOW (ref 39.0–52.0)
Hemoglobin: 8.5 g/dL — ABNORMAL LOW (ref 13.0–17.0)
MCH: 23.7 pg — ABNORMAL LOW (ref 26.0–34.0)
MCHC: 27.5 g/dL — ABNORMAL LOW (ref 30.0–36.0)
MCV: 86.1 fL (ref 80.0–100.0)
Platelets: 271 10*3/uL (ref 150–400)
RBC: 3.59 MIL/uL — ABNORMAL LOW (ref 4.22–5.81)
RDW: 17.2 % — ABNORMAL HIGH (ref 11.5–15.5)
WBC: 12.1 10*3/uL — ABNORMAL HIGH (ref 4.0–10.5)
nRBC: 0 % (ref 0.0–0.2)

## 2020-03-12 LAB — COMPREHENSIVE METABOLIC PANEL
ALT: 21 U/L (ref 0–44)
AST: 36 U/L (ref 15–41)
Albumin: 2.5 g/dL — ABNORMAL LOW (ref 3.5–5.0)
Alkaline Phosphatase: 92 U/L (ref 38–126)
Anion gap: 10 (ref 5–15)
BUN: 25 mg/dL — ABNORMAL HIGH (ref 8–23)
CO2: 42 mmol/L — ABNORMAL HIGH (ref 22–32)
Calcium: 8.2 mg/dL — ABNORMAL LOW (ref 8.9–10.3)
Chloride: 86 mmol/L — ABNORMAL LOW (ref 98–111)
Creatinine, Ser: 0.75 mg/dL (ref 0.61–1.24)
GFR, Estimated: >60 mL/min (ref >60)
Glucose, Bld: 122 mg/dL — ABNORMAL HIGH (ref 70–99)
Potassium: 3.5 mmol/L (ref 3.5–5.1)
Sodium: 138 mmol/L (ref 135–145)
Total Bilirubin: 0.5 mg/dL (ref 0.3–1.2)
Total Protein: 6.1 g/dL — ABNORMAL LOW (ref 6.5–8.1)

## 2020-03-12 LAB — GLUCOSE, CAPILLARY
Glucose-Capillary: 108 mg/dL — ABNORMAL HIGH (ref 70–99)
Glucose-Capillary: 109 mg/dL — ABNORMAL HIGH (ref 70–99)
Glucose-Capillary: 140 mg/dL — ABNORMAL HIGH (ref 70–99)

## 2020-03-12 LAB — MAGNESIUM: Magnesium: 1.9 mg/dL (ref 1.7–2.4)

## 2020-03-12 MED ORDER — AMIODARONE HCL 200 MG PO TABS
400.0000 mg | ORAL_TABLET | Freq: Two times a day (BID) | ORAL | Status: DC
  Administered 2020-03-12 – 2020-03-13 (×3): 400 mg via ORAL
  Filled 2020-03-12 (×3): qty 2

## 2020-03-12 MED ORDER — DILTIAZEM HCL 25 MG/5ML IV SOLN
10.0000 mg | Freq: Once | INTRAVENOUS | Status: AC
  Administered 2020-03-12: 10 mg via INTRAVENOUS
  Filled 2020-03-12: qty 5

## 2020-03-12 MED ORDER — METOPROLOL TARTRATE 25 MG PO TABS
25.0000 mg | ORAL_TABLET | Freq: Two times a day (BID) | ORAL | Status: DC
  Administered 2020-03-12 (×2): 25 mg via ORAL
  Filled 2020-03-12 (×3): qty 1

## 2020-03-12 NOTE — Progress Notes (Signed)
Pt with afib, continues to have episodes of heart rates up to 150's, sustaining for < 5 minutes. Pt with no c/o SOB or chest pain, currrent b/p 105/59 and heart rate now 95-112. MD Laural Benes notified of continued elevated heart rate and current b/p.

## 2020-03-12 NOTE — Progress Notes (Signed)
PROGRESS NOTE    Jonathan Wise  YQM:[id number] DOB: 1945-12-30 DOA: 03/05/2020 PCP: [name], MD   Chief complaint: Acute respiratory failure secondary to CHF and pneumonia.  Brief Narrative:  JOHNOTHAN Wise is a 74 y.o. male with medical history significant for paroxysmal A. fib on Eliquis, chronic diastolic CHF, chronic respiratory failure on 3 L home oxygen, DVT, hypertension, hyperlipidemia, obesity (BMI 36.94), sleep apnea, insulin-dependent type 2 diabetes, venous stasis ulcers presenting to the ED via EMS for evaluation of lethargy and somnolence.  Patient was unable to provide history due to being intubated and sedated.  History was obtained from ED physician, per report, patient presented with 3-day onset of generalized weakness and within last 24 hours, he was more somnolent and not talking, so EMS was activated, on arrival of EMS patient was noted to have O2 sat in the 70s, patient was noted to be obtunded on arrival to the ED.   ED Course:  In the emergency department, she was noted to be hypothermic with a temperature of 95.64F, tachypneic, tachycardic, BP 180/163 and was on BiPAP with O2 sat of 97%.  Work-up in the ED showed leukocytosis, hyperglycemia, BNP 190, respiratory panel for influenza A, B and SARS coronavirus 2 was negative, troponin I 19 > 20, VBG showed pH 7.159, PCO2 > 120, PO2 68.4, O2 sat 89.4% at FiO2 of 40%.  Lactic acid was within normal range and urinalysis was unimpressive for UTI. Chest x-ray showed cardiomegaly with findings of CHF and small bilateral pleural effusions.  Bibasilar atelectasis versus infiltrate noted. Per ED physician, family was unable to determine patient's CODE STATUS and due to patient's obtunded state, he was intubated pending family's decision. He was treated with IV Vanco and ceftriaxone, he was initially started on IV propofol for sedation, but this was changed to IV fentanyl due to hypotension.  Hospitalist was asked to admit  patient for further evaluation and management.   Assessment & Plan: 1-acute respiratory failure with hypoxia and hypercapnia -He is now s/p being intubated and mechanically ventilated -Continue antibiotics for pneumonia -Follow final culture results. -Patient is status post extubation and diuresing well -Continue diuresis. -Per pulmonology recommendations will use as needed BiPAP at night.  2-acute metabolic encephalopathy - improving -Multifactorial: Including hypoxia, hypercapnia and infection. -Significantly improve and patient mentation back to baseline. -Continue to follow clinical response.  3-Hyperglycemia due to diabetes mellitus -Continue sliding scale insulin -Follow CBGs and further adjust hypoglycemic regimen as required. CBG (last 3)  Recent Labs    03/11/20 2122 03/12/20 0730 03/12/20 1106  GLUCAP 188* 108* 109*    4-mild troponin elevation -In the setting of demand ischemia from CHF exacerbation, sepsis and regular rate/rhythm. -No acute ischemic changes -Continue telemetry monitoring. -There was no complaint of chest pain prior to come to the hospital.  5-history of atrial fibrillation/DVT -Rate continues to fluctuate and at times become difficult to control -Continue amiodarone increase to 400 mg BID -continue Eliquis for full anticoagulation. -Continue metoprolol increased to 25 mg BID  6-acute on chronic diastolic heart failure -Continue diuresis. He is tolerating 60 mg BID and diuresing well.  -Follow daily weights and strict intake and output -2D echo demonstrating grade 2 diastolic dysfunction; preserved ejection fraction and No wall motion normalities. -So far diuresing well, with a stable renal function.  7-Severe sepsis ruled in and present on admission. -Patient with hypothermia, tachypnea and leukocytosis on presentation. -Chest x-ray suggesting source of infection with positive infiltrates. 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 -Continue supportive care. -continue flutter valve.  8-DNR -After extensive discussion with patient he would like to be DNR/DNI -Palliative care has been consulted and following.   Hypokalemia/ Hypomagnesemia - repleted.   DVT prophylaxis: Lovenox Code Status: DNR  Family Communication: palliative care discussion with family today Disposition:   Status is: Inpatient  Dispo: The patient is from: Home              Anticipated d/c is to: To be determined              Anticipated d/c date is: To be determined              Patient currently not medically ready for discharge;   Consultants:  PCCM Palliative care.  Procedures:  See below for x-ray reports Mechanical intubation and ventilatory support 2D echo:  1. Left ventricular ejection fraction, by estimation, is 60 to 65%. The  left ventricle has normal function. The left ventricle has no regional  wall motion abnormalities. There is mild concentric left ventricular  hypertrophy. Left ventricular diastolic  parameters are consistent with Grade II diastolic dysfunction  (pseudonormalization).   2. Right ventricular systolic function is normal. The right ventricular  size is normal.   3. Left atrial size was moderately dilated.   4. Right atrial size was mildly dilated.   5. The mitral valve is degenerative. No evidence of mitral valve  regurgitation.   6. The aortic valve was not well visualized. There is moderate  calcification of the aortic valve. There is moderate thickening of the  aortic valve. Aortic valve regurgitation is not visualized. Moderate  aortic valve stenosis.   Antimicrobials:  Vancomycin and cefepime 11/26>>>11/29 Rocephin 11/29-12/3   Subjective: Pt reports that he might need a bigger hospital bed at home.  He asked me to let the case manager know about it.     Objective: Vitals:   03/12/20 1100 03/12/20 1234  03/12/20 1300 03/12/20 1316  BP:  (!) 101/58  105/69  Pulse: (!) 101 96 (!) 154 (!) 101  Resp: 18 18 18 18   Temp:      TempSrc:      SpO2: 98% 100%  100%  Weight:      Height:        Intake/Output Summary (Last 24 hours) at 03/12/2020 1406 Last data filed at 03/12/2020 0500 Gross per 24 hour  Intake 240 ml  Output 1500 ml  Net -1260 ml   Filed Weights   03/09/20 0502 03/10/20 0500 03/11/20 0330  Weight: 123.4 kg 122.7 kg 121.1 kg    Examination: General exam: Pt appears chronically ill. He is awake and alert.  Respiratory system: crackles Bibasilar, no wheeze or rales heard.  Cardiovascular system: normal s1, s2 sounds. Irregularly irregular.  Gastrointestinal system: morbidly obese, soft, nondistended, normal BS.14/02/21   Central nervous system: no gross focal findings.  Extremities: 1+ edema BLEs.  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  Psychiatry: Judgement and insight appear normal. Mood & affect appropriate.   Data Reviewed: I have personally reviewed following labs and imaging studies  CBC: Recent Labs  Lab 03/05/20 1844 03/05/20 1844 03/06/20 0416 03/08/20 1317 03/10/20 0509 03/11/20 0314 03/12/20 0448  WBC 14.5*   < > 9.6 20.4* 15.1* 12.0* 12.1*  NEUTROABS 11.7*  --   --   --   --   --   --  HGB 8.9*   < > 8.1* 9.0* 8.5* 8.0* 8.5*  HCT 37.0*   < > 31.3* 32.2* 32.6* 29.8* 30.9*  MCV 95.4   < > 92.1 83.6 88.3 86.4 86.1  PLT 354   < > 269 302 290 263 271   < > = values in this interval not displayed.    Basic Metabolic Panel: Recent Labs  Lab 03/06/20 0416 03/08/20 1317 03/10/20 0509 03/11/20 0314 03/12/20 0448  NA 141 139 139 137 138  K 4.0 3.2* 2.5* 2.8* 3.5  CL 95* 94* 91* 88* 86*  CO2 34* 30 38* 42* 42*  GLUCOSE 73 103* 121* 141* 122*  BUN 26* 33* 34* 29* 25*  CREATININE 0.90 1.04 0.95 0.81 0.75  CALCIUM 9.0 8.2* 8.0* 8.0* 8.2*  MG 1.8  --  1.6* 2.3 1.9  PHOS 1.0* 3.6  --   --   --     GFR: Estimated  Creatinine Clearance: 110.5 mL/min (by C-G formula based on SCr of 0.75 mg/dL).  Liver Function Tests: Recent Labs  Lab 03/05/20 1844 03/06/20 0416 03/08/20 1317 03/11/20 0314 03/12/20 0448  AST 15 16 24  36 36  ALT 7 8 11 18 21   ALKPHOS 66 53 65 82 92  BILITOT 0.9 1.2 1.5* 0.5 0.5  PROT 7.2 6.1* 6.0* 5.9* 6.1*  ALBUMIN 3.2* 2.8* 2.3* 2.3* 2.5*    CBG: Recent Labs  Lab 03/11/20 1216 03/11/20 1634 03/11/20 2122 03/12/20 0730 03/12/20 1106  GLUCAP 135* 144* 188* 108* 109*     Recent Results (from the past 240 hour(s))  Resp Panel by RT-PCR (Flu A&B, Covid) Nasopharyngeal Swab     Status: None   Collection Time: 03/05/20  6:40 PM   Specimen: Nasopharyngeal Swab; Nasopharyngeal(NP) swabs in vial transport medium  Result Value Ref Range Status   SARS Coronavirus 2 by RT PCR NEGATIVE NEGATIVE Final    Comment: (NOTE) SARS-CoV-2 target nucleic acids are NOT DETECTED.  The SARS-CoV-2 RNA is generally detectable in upper respiratory specimens during the acute phase of infection. The lowest concentration of SARS-CoV-2 viral copies this assay can detect is 138 copies/mL. A negative result does not preclude SARS-Cov-2 infection and should not be used as the sole basis for treatment or other patient management decisions. A negative result may occur with  improper specimen collection/handling, submission of specimen other than nasopharyngeal swab, presence of viral mutation(s) within the areas targeted by this assay, and inadequate number of viral copies(<138 copies/mL). A negative result must be combined with clinical observations, patient history, and epidemiological information. The expected result is Negative.  Fact Sheet for Patients:  14/03/21  Fact Sheet for Healthcare Providers:  03/07/20  This test is no t yet approved or cleared by the [employer] FDA and  has been authorized for detection and/or  diagnosis of SARS-CoV-2 by FDA under an Emergency Use Authorization (EUA). This EUA will remain  in effect (meaning this test can be used) for the duration of the COVID-19 declaration under Section 564(b)(1) of the Act, 21 U.S.C.section 360bbb-3(b)(1), unless the authorization is terminated  or revoked sooner.       Influenza A by PCR NEGATIVE NEGATIVE Final   Influenza B by PCR NEGATIVE NEGATIVE Final    Comment: (NOTE) The Xpert Xpress SARS-CoV-2/FLU/RSV plus assay is intended as an aid in the diagnosis of influenza from Nasopharyngeal swab specimens and should not be used as a sole basis for treatment. Nasal washings and aspirates are unacceptable for Xpert Xpress SARS-CoV-2/FLU/RSV  testing.  Fact Sheet for Patients: BloggerCourse.comhttps://www.fda.gov/media/152166/download  Fact Sheet for Healthcare Providers: SeriousBroker.ithttps://www.fda.gov/media/152162/download  This test is not yet approved or cleared by the [place] FDA and has been authorized for detection and/or diagnosis of SARS-CoV-2 by FDA under an Emergency Use Authorization (EUA). This EUA will remain in effect (meaning this test can be used) for the duration of the COVID-19 declaration under Section 564(b)(1) of the Act, 21 U.S.C. section 360bbb-3(b)(1), unless the authorization is terminated or revoked.  Performed at [facility], [address] [place], [place] [address]   Blood culture (routine single)     Status: None   Collection Time: 03/05/20  6:44 PM   Specimen: BLOOD LEFT ARM  Result Value Ref Range Status   Specimen Description BLOOD LEFT ARM  Final   Special Requests   Final    BOTTLES DRAWN AEROBIC AND ANAEROBIC Blood Culture adequate volume   Culture   Final    NO GROWTH 6 DAYS Performed at [facility], [address] [place], [place] [address]    Report Status 03/11/2020 FINAL  Final  Urine culture     Status: None   Collection Time: 03/05/20  8:12 PM   Specimen: In/Out Cath Urine  Result Value Ref Range  Status   Specimen Description   Final    IN/OUT CATH URINE Performed at [facility], [address] [place], [place] [address]    Special Requests   Final    NONE Performed at [facility], [address] [place], [place] [address]    Culture   Final    NO GROWTH Performed at [facility], [address] [place], [place] [address]    Report Status 03/07/2020 FINAL  Final  MRSA PCR Screening     Status: Abnormal   Collection Time: 03/05/20 11:28 PM   Specimen: Nasal Mucosa; Nasopharyngeal  Result Value Ref Range Status   MRSA by PCR POSITIVE (A) NEGATIVE Final    Comment:        The GeneXpert MRSA Assay (FDA approved for NASAL specimens only), is one component of a comprehensive MRSA colonization surveillance program. It is not intended to diagnose MRSA infection nor to guide or monitor treatment for MRSA infections. RESULT CALLED TO, READ BACK BY AND VERIFIED WITH: [name],J @ 0221 ON 03/06/20 BY JUW Performed at [facility], [address] [place], [place] [address]      Radiology Studies: No results found.  Scheduled Meds:  (feeding supplement) PROSource Plus  30 mL Oral BID BM   amiodarone  400 mg Oral BID   apixaban  5 mg Oral BID   Chlorhexidine Gluconate Cloth  6 each Topical Daily   feeding supplement  237 mL Oral BID BM   furosemide  60 mg Oral BID   insulin aspart  0-9 Units Subcutaneous TID AC & HS   metoprolol tartrate  25 mg Oral BID   multivitamin with minerals  1 tablet Oral Daily   potassium chloride  40 mEq Oral BID   pravastatin  40 mg Oral Daily   Continuous Infusions:  cefTRIAXone (ROCEPHIN)  IV 1 g (03/11/20 1629)     LOS: 7 days   Time spent: 35 mins  Keyontae Huckeby Laural BenesJohnson, MD How to contact the [facility] Attending or Consulting provider 7A - 7P or covering provider during after hours 7P -7A, for this patient?  Check the care team in [facility] and look for a) attending/consulting [facility] provider listed and b) the [facility] team  listed Log into  [email or web address] and use 's universal password to access. If you do not have the password, please contact the hospital operator. Locate the [facility] provider you are looking for under [facility] and page to a number that you can be directly reached. If you still have difficulty reaching the provider, please page the [facility] (Director on Call) for the Hospitalists listed on amion for assistance.   03/12/2020, 2:06 PM

## 2020-03-12 NOTE — Progress Notes (Signed)
Pt had episodes of elevated heart rates > 150 today, MD Johnson notified and orders received (see MAR and order entry). Telemetry discontinued per MD order at 1340.  Pt required total assist to turn and reposition. Non-compliant with turning and propping. Pt refused to eat today, taking only one cup of Ensure and bites of his lunch tray.  Pt had confused conversation off and on most of day. Pt also had multiple episodes of attempted inappropriate conversations of a sexual nature with multiple male staff members. Pt was advised multiple times that this type of conversation was not tolerated. Pt would apologize then begin again with next male to enter room. Pt has Stg II ulcer to tip of penis at 11:00 position. Area cleaned and barrier cream applied. Unable to apply protective foam to this area. Foley catheter repositioned and secured to avoid this area.  Pt has multiple large areas of red, excoriated areas all over body. Areas on buttocks and upper posterior thighs dry and flaky with small bleeding areas.When old foam dressing removed from left buttock area, bluish-green drainage noted on dressing. Drainage resembled pseudomonas but no odor noted. All areas were cleaned & foam dressings applied. Pt also has Stg I to mid-back, 0.5cm x 6cm, foam dressing applied. Dressings to wounds on bilateral lower legs cleaned and redressed with saline moistened guaze (right posterior leg) and petroleum guaze (bilateral lower legs) and wrapped with kerlix. Foam heel protectors applied bilaterally as well.

## 2020-03-13 LAB — COMPREHENSIVE METABOLIC PANEL
ALT: 20 U/L (ref 0–44)
AST: 29 U/L (ref 15–41)
Albumin: 2.7 g/dL — ABNORMAL LOW (ref 3.5–5.0)
Alkaline Phosphatase: 96 U/L (ref 38–126)
Anion gap: 11 (ref 5–15)
BUN: 21 mg/dL (ref 8–23)
CO2: 41 mmol/L — ABNORMAL HIGH (ref 22–32)
Calcium: 8.8 mg/dL — ABNORMAL LOW (ref 8.9–10.3)
Chloride: 88 mmol/L — ABNORMAL LOW (ref 98–111)
Creatinine, Ser: 0.67 mg/dL (ref 0.61–1.24)
GFR, Estimated: >60 mL/min (ref >60)
Glucose, Bld: 114 mg/dL — ABNORMAL HIGH (ref 70–99)
Potassium: 3.5 mmol/L (ref 3.5–5.1)
Sodium: 140 mmol/L (ref 135–145)
Total Bilirubin: 0.7 mg/dL (ref 0.3–1.2)
Total Protein: 6.6 g/dL (ref 6.5–8.1)

## 2020-03-13 LAB — MAGNESIUM: Magnesium: 1.9 mg/dL (ref 1.7–2.4)

## 2020-03-13 LAB — CBC
HCT: 33.4 % — ABNORMAL LOW (ref 39.0–52.0)
Hemoglobin: 8.9 g/dL — ABNORMAL LOW (ref 13.0–17.0)
MCH: 23.1 pg — ABNORMAL LOW (ref 26.0–34.0)
MCHC: 26.6 g/dL — ABNORMAL LOW (ref 30.0–36.0)
MCV: 86.5 fL (ref 80.0–100.0)
Platelets: 334 10*3/uL (ref 150–400)
RBC: 3.86 MIL/uL — ABNORMAL LOW (ref 4.22–5.81)
RDW: 17.4 % — ABNORMAL HIGH (ref 11.5–15.5)
WBC: 13 10*3/uL — ABNORMAL HIGH (ref 4.0–10.5)
nRBC: 0 % (ref 0.0–0.2)

## 2020-03-13 LAB — GLUCOSE, CAPILLARY
Glucose-Capillary: 109 mg/dL — ABNORMAL HIGH (ref 70–99)
Glucose-Capillary: 177 mg/dL — ABNORMAL HIGH (ref 70–99)

## 2020-03-13 MED ORDER — ENSURE ENLIVE PO LIQD: 237.0000 mL | Freq: Two times a day (BID) | ORAL | 2 refills | Status: AC

## 2020-03-13 MED ORDER — FUROSEMIDE 40 MG PO TABS
40.0000 mg | ORAL_TABLET | Freq: Two times a day (BID) | ORAL | 2 refills | Status: DC
Start: 2020-03-13 — End: 2022-06-22

## 2020-03-13 MED ORDER — PROSOURCE PLUS PO LIQD
30.0000 mL | Freq: Two times a day (BID) | ORAL | 2 refills | Status: DC
Start: 2020-03-13 — End: 2022-06-17

## 2020-03-13 MED ORDER — POTASSIUM CHLORIDE CRYS ER 20 MEQ PO TBCR
40.0000 meq | EXTENDED_RELEASE_TABLET | Freq: Every day | ORAL | 2 refills | Status: DC
Start: 2020-03-13 — End: 2022-06-17

## 2020-03-13 MED ORDER — AMIODARONE HCL 200 MG PO TABS: 400.0000 mg | ORAL_TABLET | Freq: Two times a day (BID) | ORAL | 0 refills | Status: DC

## 2020-03-13 MED ORDER — ADULT MULTIVITAMIN W/MINERALS CH
1.0000 | ORAL_TABLET | Freq: Every day | ORAL | Status: DC
Start: 2020-03-14 — End: 2022-06-17

## 2020-03-13 MED ORDER — METOPROLOL TARTRATE 50 MG PO TABS
50.0000 mg | ORAL_TABLET | Freq: Two times a day (BID) | ORAL | Status: DC
  Administered 2020-03-13: 50 mg via ORAL
  Filled 2020-03-13: qty 1

## 2020-03-13 MED ORDER — DOXYCYCLINE HYCLATE 100 MG PO TABS
100.0000 mg | ORAL_TABLET | Freq: Two times a day (BID) | ORAL | Status: DC
  Administered 2020-03-13: 100 mg via ORAL
  Filled 2020-03-13 (×2): qty 1

## 2020-03-13 MED ORDER — DOXYCYCLINE HYCLATE 100 MG PO TABS: 100.0000 mg | ORAL_TABLET | Freq: Two times a day (BID) | ORAL | 0 refills | Status: AC

## 2020-03-13 MED ORDER — METOPROLOL TARTRATE 50 MG PO TABS
50.0000 mg | ORAL_TABLET | Freq: Two times a day (BID) | ORAL | 2 refills | Status: DC
Start: 2020-03-13 — End: 2022-06-22

## 2020-03-13 NOTE — TOC Transition Note (Signed)
Transition of Care [facility]) - CM/SW Discharge Note   Patient Details  Name: Jonathan Wise MRN: [id number] Date of Birth: 1945/08/16  [facility]) CM/SW Contact:  Barry Brunner, LCSW Phone Number: 03/13/2020, 10:22 AM   Clinical Narrative:    CSW contacted [facility] for patient's discharge. Tina with [facility] agreeable to provide services upon patient's discharge. CSW notified Inetta Fermo of patient's O2 needs and request for a bariatric bed. Inetta Fermo reported that they do not cary bariatric beds due the the fact that their current supply of beds hold's up to 450lb. Inetta Fermo expressed that she believed the request was to accommodate patient's height and that Cassandra with [facility] would have to make a special request for bariatric bed. Inetta Fermo also reported that patient already had O2 in the home, but a larger tank would be provided to meet increased O2 needs. MD reported tha that patient's wife not able to bring O2 to the hospital for discharge. CSW completed med necessity form and Called EMS. EMS confirmed that patient will received O2 while in transport.  TOC signing off.    Final next level of care: Home w Hospice Care Barriers to Discharge: Barriers Resolved   Patient Goals and CMS Choice Patient states their goals for this hospitalization and ongoing recovery are:: Discharge home with hospices CMS Medicare.gov Compare Post Acute Care list provided to:: Patient Choice offered to / list presented to : Patient, Spouse  Discharge Placement                    Patient and family notified of of transfer: 03/13/20  Discharge Plan and Services In-house Referral: Clinical Social Work, Hospice / Palliative Care Discharge Planning Services: NA Post Acute Care Choice: Hospice                      [facility] Agency: [facility] Date [facility] Agency Contacted: 03/10/20 Time HH Agency Contacted: 1347    Social Determinants of Health (SDOH) Interventions     Readmission Risk  Interventions Readmission Risk Prevention Plan 03/10/2020 12/07/2019 12/04/2019  Transportation Screening Complete - Complete  PCP or Specialist Appt within 5-7 Days - - -  Not Complete comments - - -  PCP or Specialist Appt within 3-5 Days - Complete -  Home Care Screening - - -  Medication Review (RN CM) - - -  HRI or Home Care Consult - - Complete  Social Work Consult for Recovery Care Planning/Counseling - - Complete  Palliative Care Screening - - Not Complete  Palliative Care Screening Not Complete Comments - - Palliative consulted; has not seen him yet  Medication Review [employer]) Complete - Complete  HRI or Home Care Consult Complete - -  SW Recovery Care/Counseling Consult Complete - -  Palliative Care Screening Complete - -  Skilled Nursing Facility Not Applicable - -  Some recent data might be hidden

## 2020-03-13 NOTE — Discharge Instructions (Signed)
[facility] is a service that is designed to provide people who are terminally ill and their families with medical, spiritual, and psychological support. Its aim is to improve your quality of life by keeping you as comfortable as possible in the final stages of life. Who will be my providers when I begin hospice care? Hospice teams often include:  A nurse.  A doctor. The hospice doctor will be available for your care, but you can include your regular doctor or nurse practitioner.  A Education officer, museum.  A counselor.  A religious leader (such as a Clinical biochemist).  A dietitian.  Therapists.  Trained volunteers who can help with care. What services does hospice provide? Hospice services can vary depending on the center or organization. Generally, they include:  Ways to keep you comfortable, such as: ? Providing care in your home or in a home-like setting. ? Working with your family and friends to help meet your needs. ? Allowing you to enjoy the support of loved ones by receiving much of your basic care from family and friends.  Pain relief and symptom management. The staff will supply all necessary medicines and equipment so that you can stay comfortable and alert enough to enjoy the company of your friends and family.  Visits or care from a nurse and doctor. This may include 24-hour on-call services.  Companionship when you are alone.  Allowing you and your family to rest. Hospice staff may do light housekeeping, prepare meals, and run errands.  Counseling. They will make sure your emotional, spiritual, and social needs are being met, as well as those needs of your family members.  Spiritual care. This will be individualized to meet your needs and your family's needs. It may involve: ? Helping you and your family understand the dying process. ? Helping you say goodbye to your family and friends. ? Performing a specific  religious ceremony or ritual.  Massage.  Nutrition therapy.  Physical and occupational therapy.  Short-term inpatient care, if something cannot be managed in the home.  Art or music therapy.  Bereavement support for grieving family members. When should hospice care begin? Most people who use hospice are believed to have less than 6 months to live.  Your family and health care providers can help you decide when hospice services should begin.  If you live longer than 6 months but your condition does not improve, your doctor may be able to approve you for continued hospice care.  If your condition improves, you may discontinue the program. What should I consider before selecting a program? Most hospice programs are run by nonprofit, independent organizations. Some are affiliated with hospitals, nursing homes, or home health care agencies. Hospice programs can take place in your home or at a hospice center, hospital, or skilled nursing facility. When choosing a hospice program, ask the following questions:  What services are available to me?  What services will be offered to my loved ones?  How involved will my loved ones be?  How involved will my health care provider be?  Who makes up the hospice care team? How are they trained or screened?  How will my pain and symptoms be managed?  If my circumstances change, can the services be provided in a different setting, such as my home or in the hospital?  Is the program reviewed and licensed by the state or certified in some other way?  What does it cost?  Is it covered by insurance?  If I choose a hospice center or nursing home, where is the hospice center located? Is it convenient for family and friends?  If I choose a hospice center or nursing home, can my family and friends visit any time?  Will you provide emotional and spiritual support?  Who can my family call with questions? Where can I learn more about hospice? You  can learn about existing hospice programs in your area from your health care providers. You can also read more about hospice online. The websites of the following organizations have helpful information:  [facility]): http://www.brown-buchanan.com/  National Association for [facility]): http://massey-hart.com/  Hospice Foundation of [place] ([place]): www.hospicefoundation.org  American Cancer Society (ACS): www.cancer.org  Hospice Net: www.hospicenet.org  [employer] [place] (VNAA): www.vnaa.org You may also find more information by contacting the following agencies:  A local agency on aging.  Your local [employer] chapter.  Your state's department of health or social services. Summary  Hospice is a service that is designed to provide people who are terminally ill and their families with medical, spiritual, and psychological support.  Hospice aims to improve your quality of life by keeping you as comfortable as possible in the final stages of life.  Hospice teams often include a doctor, nurse, social worker, counselor, religious leader,dietitian, therapists, and volunteers.  Hospice care generally includes medicine for symptom management, visits from doctors and nurses, physical and occupational therapy, nutrition counseling, spiritual and emotional counseling, caregiver support, and bereavement support for grieving family members.  Hospice programs can take place in your home or at a hospice center, hospital, or skilled nursing facility. This information is not intended to replace advice given to you by your health care provider. Make sure you discuss any questions you have with your health care provider. Document Revised: 12/18/2018 Document Reviewed: 04/18/2016 Elsevier Patient Education  [place].

## 2020-03-13 NOTE — Progress Notes (Signed)
Nsg Discharge Note  Admit Date:  03/05/2020 Discharge date: 03/13/2020   [name] to be D/C'd Home per MD order.  AVS completed.  Copy for chart, and copy for patient signed, and dated. Patient/caregiver able to verbalize understanding.  Discharge Medication: Allergies as of 03/13/2020   No Known Allergies     Medication List    STOP taking these medications   digoxin 0.125 MG tablet Commonly known as: LANOXIN   metFORMIN 500 MG tablet Commonly known as: GLUCOPHAGE   Tresiba FlexTouch 100 UNIT/ML FlexTouch Pen Generic drug: insulin degludec   zinc sulfate 220 (50 Zn) MG capsule     TAKE these medications   amiodarone 200 MG tablet Commonly known as: PACERONE Take 2 tablets (400 mg total) by mouth 2 (two) times daily.   apixaban 5 MG Tabs tablet Commonly known as: ELIQUIS Take 1 tablet (5 mg total) by mouth 2 (two) times daily.   doxycycline 100 MG tablet Commonly known as: VIBRA-TABS Take 1 tablet (100 mg total) by mouth every 12 (twelve) hours for 5 days.   feeding supplement Liqd Take 237 mLs by mouth 2 (two) times daily between meals.   (feeding supplement) PROSource Plus liquid Take 30 mLs by mouth 2 (two) times daily between meals.   furosemide 40 MG tablet Commonly known as: Lasix Take 1 tablet (40 mg total) by mouth 2 (two) times daily. What changed: when to take this   metoprolol tartrate 50 MG tablet Commonly known as: LOPRESSOR Take 1 tablet (50 mg total) by mouth 2 (two) times daily. What changed:   medication strength  how much to take   multivitamin with minerals Tabs tablet Take 1 tablet by mouth daily. Start taking on: March 14, 2020   potassium chloride SA 20 MEQ tablet Commonly known as: KLOR-CON Take 2 tablets (40 mEq total) by mouth daily.   pravastatin 40 MG tablet Commonly known as: PRAVACHOL Take 40 mg by mouth daily.       Discharge Assessment: Vitals:   03/13/20 0410 03/13/20 1358  BP: (!) 142/71 117/69   Pulse: (!) 105 94  Resp: 20 18  Temp: (!) 97.1 F (36.2 C) 98.3 F (36.8 C)  SpO2: 100% 100%   Skin clean, dry and intact without evidence of skin break down, no evidence of skin tears noted. IV catheter discontinued intact. Site without signs and symptoms of complications - no redness or edema noted at insertion site, patient denies c/o pain - only slight tenderness at site.  Dressing with slight pressure applied.  D/c Instructions-Education: Discharge instructions given to patient/family with verbalized understanding. D/c education completed with patient/family including follow up instructions, medication list, d/c activities limitations if indicated, with other d/c instructions as indicated by MD - patient able to verbalize understanding, all questions fully answered. Patient instructed to return to ED, call 911, or call MD for any changes in condition.  Patient escorted via WC, and D/C home via private auto.  Brandy Hale, LPN 88/07/1658 6:30 PM

## 2020-03-13 NOTE — Discharge Summary (Addendum)
Physician Discharge Summary  Jonathan Wise NWG:[id number] DOB: 08/13/1945 DOA: 03/05/2020  PCP: [name], MD  Admit date: 03/05/2020 Discharge date: 03/13/2020  Admitted From:  Home  Disposition:   Home with hospice   Recommendations for Outpatient Follow-up:  1. Pt is discharging with home hospice services  2. SYMPTOM MANAGEMENT PER HOSPICE PROTOCOLS   Home Health: Hospice  Discharge Condition: Hospice   CODE STATUS: DNR    Brief Hospitalization Summary: Please see all hospital notes, images, labs for full details of the hospitalization. ADMISSION HPI: DEQUAVION FOLLETTE is a 74 y.o. male with medical history significant for paroxysmal A. fib on Eliquis, chronic diastolic CHF, chronic respiratory failure on 3 L home oxygen, DVT, hypertension, hyperlipidemia, obesity (BMI 36.94), sleep apnea, insulin-dependent type 2 diabetes, venous stasis ulcers presenting to the ED via EMS for evaluation of lethargy and somnolence.  Patient was unable to provide history due to being intubated and sedated.  History was obtained from ED physician, per report, patient presented with 3-day onset of generalized weakness and within last 24 hours, he was more somnolent and not talking, so EMS was activated, on arrival of EMS patient was noted to have O2 sat in the 70s, patient was noted to be obtunded on arrival to the ED.  ED Course:  In the emergency department, she was noted to be hypothermic with a temperature of 95.15F, tachypneic, tachycardic, BP 180/163 and was on BiPAP with O2 sat of 97%.  Work-up in the ED showed leukocytosis, hyperglycemia, BNP 190, respiratory panel for influenza A, B and SARS coronavirus 2 was negative, troponin I 19 > 20, VBG showed pH 7.159, PCO2 > 120, PO2 68.4, O2 sat 89.4% at FiO2 of 40%.  Lactic acid was within normal range and urinalysis was unimpressive for UTI. Chest x-ray showed cardiomegaly with findings of CHF and small bilateral pleural effusions.  Bibasilar  atelectasis versus infiltrate noted.  Per ED physician, family was unable to determine patient's CODE STATUS and due to patient's obtunded state, he was intubated pending family's decision.  He was treated with IV Vanco and ceftriaxone, he was initially started on IV propofol for sedation, but this was changed to IV fentanyl due to hypotension.  Hospitalist was asked to admit patient for further evaluation and management.  Hospital Course Description  Assessment & Plan: 1-acute respiratory failure with hypoxia and hypercapnia -He is now s/p being intubated and mechanically ventilated -he is treated with antibiotics for pneumonia -Patient is status post extubation and diuresing well -Continue diuresis with oral lasix. -he is back to baseline oxygen 3L/min which is his home oxygen requirement.   2-acute metabolic encephalopathy - improved -Multifactorial: Including hypoxia, hypercapnia and infection. -Significantly improve and patient mentation back to baseline. -Pt wants to discharge home today with hospice.   3-Hyperglycemia due to diabetes mellitus -Continue sliding scale insulin -Follow CBGs and further adjust hypoglycemic regimen as required. CBG (last 3)  Recent Labs    03/12/20 1613 03/13/20 0719 03/13/20 1140  GLUCAP 140* 109* 177*   4-mild troponin elevation -In the setting of demand ischemia from CHF exacerbation, sepsis and regular rate/rhythm. -No acute ischemic changes -Continue telemetry monitoring. -There was no complaint of chest pain prior to come to the hospital.  5-history of atrial fibrillation/DVT -Rate continues to fluctuate and at times become difficult to control -reviewed care with cardiologist, continue amiodarone 400 mg BID -continue Eliquis for full anticoagulation. -Continue metoprolol increased to 50 mg BID  6-acute on chronic diastolic heart  failure -Continue diuresis. He is tolerating lasix 60 mg BID and diuresing well.  -Follow daily  weights and strict intake and output -2D echo demonstrating grade 2 diastolic dysfunction; preserved ejection fraction and No wall motion normalities. -he is diuresing well, with a stable renal function.  7-Severe sepsis ruled in and present on admission. -Patient presented with hypothermia, tachypnea and leukocytosis  -Chest x-ray suggesting source of infection with positive infiltrates. 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 He will complete several more days of doxycycline.  -Continue supportive care.  8-DNR -After extensive discussion with patient he would like to be DNR/DNI -Palliative care has been consulted and patient has decided to go home with hospice services.     9.  Hypokalemia/ Hypomagnesemia - repleted.   DVT prophylaxis: Lovenox Code Status: DNR  Family Communication: palliative care discussion with family today Disposition:   Status is: Inpatient  Dispo: The patient is from: Home  Anticipated d/c is to: To be determined  Anticipated d/c date is: To be determined  Patient currently not medically ready for discharge;   Consultants:   PCCM  Palliative care.  Procedures:  See below for x-ray reports Mechanical intubation and ventilatory support 2D echo: 1. Left ventricular ejection fraction, by estimation, is 60 to 65%. The  left ventricle has normal function. The left ventricle has no regional  wall motion abnormalities. There is mild concentric left ventricular  hypertrophy. Left ventricular diastolic  parameters are consistent with Grade II diastolic dysfunction  (pseudonormalization).  2. Right ventricular systolic function is normal. The right ventricular  size is normal.  3. Left atrial size was moderately dilated.  4. Right atrial size was mildly dilated.  5. The mitral valve is degenerative. No evidence of mitral valve   regurgitation.  6. The aortic valve was not well visualized. There is moderate  calcification of the aortic valve. There is moderate thickening of the  aortic valve. Aortic valve regurgitation is not visualized. Moderate  aortic valve stenosis.   Antimicrobials:  Vancomycin and cefepime 11/26>>>11/29 Rocephin 11/29-12/3 Doxycycline 12/4>>  Discharge Diagnoses:  Principal Problem:   Acute on chronic respiratory failure with hypoxia and hypercapnia (HCC) Active Problems:   Hyperglycemia due to diabetes mellitus (HCC)   Atrial fibrillation with RVR (HCC)   DVT (deep venous thrombosis) (HCC)   Acute on chronic diastolic CHF (congestive heart failure) (HCC)   Elevated troponin   SIRS (systemic inflammatory response syndrome) (HCC)   Elevated brain natriuretic peptide (BNP) level   Bilateral pleural effusion   Protein calorie malnutrition (HCC)   DNR (do not resuscitate)   Acute respiratory distress  Discharge Instructions:  Allergies as of 03/13/2020   No Known Allergies     Medication List    STOP taking these medications   digoxin 0.125 MG tablet Commonly known as: LANOXIN   metFORMIN 500 MG tablet Commonly known as: GLUCOPHAGE   Tresiba FlexTouch 100 UNIT/ML FlexTouch Pen Generic drug: insulin degludec   zinc sulfate 220 (50 Zn) MG capsule     TAKE these medications   amiodarone 200 MG tablet Commonly known as: PACERONE Take 2 tablets (400 mg total) by mouth 2 (two) times daily.   apixaban 5 MG Tabs tablet Commonly known as: ELIQUIS Take 1 tablet (5 mg total) by mouth 2 (two) times daily.   doxycycline 100 MG tablet Commonly known as: VIBRA-TABS Take 1 tablet (100 mg total) by mouth every 12 (twelve) hours for  5 days.   feeding supplement Liqd Take 237 mLs by mouth 2 (two) times daily between meals.   (feeding supplement) PROSource Plus liquid Take 30 mLs by mouth 2 (two) times daily between meals.   furosemide 40 MG tablet Commonly known as:  Lasix Take 1 tablet (40 mg total) by mouth 2 (two) times daily. What changed: when to take this   metoprolol tartrate 50 MG tablet Commonly known as: LOPRESSOR Take 1 tablet (50 mg total) by mouth 2 (two) times daily. What changed:   medication strength  how much to take   multivitamin with minerals Tabs tablet Take 1 tablet by mouth daily. Start taking on: March 14, 2020   potassium chloride SA 20 MEQ tablet Commonly known as: KLOR-CON Take 2 tablets (40 mEq total) by mouth daily.   pravastatin 40 MG tablet Commonly known as: PRAVACHOL Take 40 mg by mouth daily.       Follow-up Information    [name], MD. Schedule an appointment as soon as possible for a visit in 2 week(s).   Specialty: Family Medicine Contact information: [address] [place] [address] [phone]              No Known Allergies Allergies as of 03/13/2020   No Known Allergies     Medication List    STOP taking these medications   digoxin 0.125 MG tablet Commonly known as: LANOXIN   metFORMIN 500 MG tablet Commonly known as: GLUCOPHAGE   Tresiba FlexTouch 100 UNIT/ML FlexTouch Pen Generic drug: insulin degludec   zinc sulfate 220 (50 Zn) MG capsule     TAKE these medications   amiodarone 200 MG tablet Commonly known as: PACERONE Take 2 tablets (400 mg total) by mouth 2 (two) times daily.   apixaban 5 MG Tabs tablet Commonly known as: ELIQUIS Take 1 tablet (5 mg total) by mouth 2 (two) times daily.   doxycycline 100 MG tablet Commonly known as: VIBRA-TABS Take 1 tablet (100 mg total) by mouth every 12 (twelve) hours for 5 days.   feeding supplement Liqd Take 237 mLs by mouth 2 (two) times daily between meals.   (feeding supplement) PROSource Plus liquid Take 30 mLs by mouth 2 (two) times daily between meals.   furosemide 40 MG tablet Commonly known as: Lasix Take 1 tablet (40 mg total) by mouth 2 (two) times daily. What changed: when to take this    metoprolol tartrate 50 MG tablet Commonly known as: LOPRESSOR Take 1 tablet (50 mg total) by mouth 2 (two) times daily. What changed:   medication strength  how much to take   multivitamin with minerals Tabs tablet Take 1 tablet by mouth daily. Start taking on: March 14, 2020   potassium chloride SA 20 MEQ tablet Commonly known as: KLOR-CON Take 2 tablets (40 mEq total) by mouth daily.   pravastatin 40 MG tablet Commonly known as: PRAVACHOL Take 40 mg by mouth daily.      Procedures/Studies: DG CHEST PORT 1 VIEW  Result Date: 03/09/2020 CLINICAL DATA:  Generalized weakness. EXAM: PORTABLE CHEST 1 VIEW COMPARISON:  March 07, 2020. FINDINGS: Stable cardiomediastinal silhouette. No pneumothorax is noted. Stable bilateral pleural effusions are noted with bibasilar atelectasis. Bony thorax is unremarkable. IMPRESSION: Stable bilateral pleural effusions with bibasilar atelectasis. Electronically Signed   By: Lupita Raider M.D.   On: 03/09/2020 17:24   DG CHEST PORT 1 VIEW  Result Date: 03/07/2020 CLINICAL DATA:  Shortness of breath. EXAM:  PORTABLE CHEST 1 VIEW COMPARISON:  March 06, 2020 FINDINGS: Stable support apparatus Cardiomediastinal silhouette is normal. Mediastinal contours appear intact. There is no evidence of pneumothorax. Bilateral moderate to large pleural effusions. Mild diffuse interstitial prominence. Osseous structures are without acute abnormality. Soft tissues are grossly normal. IMPRESSION: 1. Bilateral moderate to large pleural effusions. 2. Mild interstitial pulmonary edema. Electronically Signed   By: Ted Mcalpine M.D.   On: 03/07/2020 09:30   DG Chest Port 1 View  Result Date: 03/06/2020 CLINICAL DATA:  Dyspnea EXAM: PORTABLE CHEST 1 VIEW COMPARISON:  03/05/2020 FINDINGS: Cardiac enlargement with pulmonary vascular congestion. Moderate bilateral pleural effusions. Perihilar infiltrates consistent with edema. Similar appearance to prior study.  Enteric and endotracheal tubes are unchanged in position. IMPRESSION: Cardiac enlargement with pulmonary vascular congestion and perihilar edema and bilateral pleural effusions similar to prior study. Electronically Signed   By: Burman Nieves M.D.   On: 03/06/2020 04:37   DG Chest Portable 1 View  Result Date: 03/05/2020 CLINICAL DATA:  NG tube placement EXAM: PORTABLE CHEST 1 VIEW COMPARISON:  03/05/2020 FINDINGS: Endotracheal tube tip is about 3 cm superior to the carina. Esophageal tube looped over the lower mediastinum with the tip projecting over the right cardiac margin, probably within large hiatal hernia. Moderate pleural effusions and basilar consolidations. IMPRESSION: Endotracheal tube tip about 3 cm superior to the carina. Esophageal tube looped over the lower mediastinum with the tip projecting over the right cardiac margin, probably within large hiatal hernia. Continued pleural effusions and basilar consolidation Electronically Signed   By: Jasmine Pang M.D.   On: 03/05/2020 23:12   DG Chest Portable 1 View  Result Date: 03/05/2020 CLINICAL DATA:  Orogastric tube placement. EXAM: PORTABLE CHEST 1 VIEW COMPARISON:  March 05, 2020 FINDINGS: There is stable endotracheal tube positioning. A nasogastric tube is seen with its distal end looped upon itself within the retrocardiac region of the lung bases. Small to moderate sized bilateral pleural effusions are seen. No pneumothorax is identified. The cardiac silhouette is mildly enlarged. The visualized skeletal structures are unremarkable. IMPRESSION: 1. Small to moderate sized bilateral pleural effusions. 2. Nasogastric tube positioning, as described above. This may be within a large gastric/hiatal hernia. Electronically Signed   By: Aram Candela M.D.   On: 03/05/2020 21:50   DG Chest Portable 1 View  Result Date: 03/05/2020 CLINICAL DATA:  Intubated EXAM: PORTABLE CHEST 1 VIEW COMPARISON:  03/05/2020, 12/03/2019 FINDINGS:  Endotracheal tube tip is about 2.8 cm superior to the carina. Esophageal tube tip appears looped back upon itself in the region of GE junction with the tip directed cranial, likely over the distal esophagus. Cardiomegaly with vascular congestion, pulmonary edema, moderate pleural effusions and basilar consolidations without change. IMPRESSION: 1. Endotracheal tube tip about 2.8 cm superior to the carina. 2. Esophageal tube tip appears looped back upon itself in the region of GE junction with the tip directed cranial, likely over the distal esophagus. 3. No change in cardiomegaly, pulmonary edema, and pleural effusions. These results will be called to the ordering clinician or representative by the Radiologist Assistant, and communication documented in the PACS or [employer]. Electronically Signed   By: Jasmine Pang M.D.   On: 03/05/2020 20:39   DG Chest Port 1 View  Result Date: 03/05/2020 CLINICAL DATA:  74 year old male with concern for sepsis. EXAM: PORTABLE CHEST 1 VIEW COMPARISON:  Chest radiograph dated 12/03/2019 FINDINGS: Mild cardiomegaly with vascular congestion and edema. Small bilateral pleural effusions with bibasilar atelectasis. Pneumonia  is not excluded clinical correlation is recommended. No pneumothorax. Atherosclerotic calcification of the aorta. Degenerative changes of the spine. No acute osseous pathology. IMPRESSION: Cardiomegaly with findings of CHF and small bilateral pleural effusions. Bibasilar atelectasis versus infiltrate. Electronically Signed   By: Elgie Collard M.D.   On: 03/05/2020 19:28   ECHOCARDIOGRAM COMPLETE  Result Date: 03/06/2020    ECHOCARDIOGRAM REPORT   Patient Name:   SLY PARLEE Date of Exam: 03/06/2020 Medical Rec #:  [id number]       Height:       73.0 in Accession #:    [id number]      Weight:       285.5 lb Date of Birth:  01-28-1946      BSA:          2.503 m Patient Age:    73 years        BP:           143/40 mmHg Patient Gender: M                HR:           76 bpm. [facility] Procedure: 2D Echo, Cardiac Doppler and Color Doppler Indications:    CHF-Acute Diastolic 428.31 / I50.31  History:        Patient has prior history of Echocardiogram examinations, most                 recent 10/20/2019. CHF, Arrythmias:Atrial Fibrillation and PVC;                 Risk Factors:Diabetes, Dyslipidemia and Hypertension. SIRS                 (systemic inflammatory response syndrome).  Sonographer:    Celesta Gentile RCS Referring Phys: [id number] OLADAPO ADEFESO  Sonographer Comments: Patient on mechanical ventilator at time of echo. IMPRESSIONS  1. Left ventricular ejection fraction, by estimation, is 60 to 65%. The left ventricle has normal function. The left ventricle has no regional wall motion abnormalities. There is mild concentric left ventricular hypertrophy. Left ventricular diastolic parameters are consistent with Grade II diastolic dysfunction (pseudonormalization).  2. Right ventricular systolic function is normal. The right ventricular size is normal.  3. Left atrial size was moderately dilated.  4. Right atrial size was mildly dilated.  5. The mitral valve is degenerative. No evidence of mitral valve regurgitation.  6. The aortic valve was not well visualized. There is moderate calcification of the aortic valve. There is moderate thickening of the aortic valve. Aortic valve regurgitation is not visualized. Moderate aortic valve stenosis. FINDINGS  Left Ventricle: Left ventricular ejection fraction, by estimation, is 60 to 65%. The left ventricle has normal function. The left ventricle has no regional wall motion abnormalities. Definity contrast agent was given IV to delineate the left ventricular  endocardial borders. The left ventricular internal cavity size was normal in size. There is mild concentric left ventricular hypertrophy. Left ventricular diastolic parameters are consistent with Grade II diastolic dysfunction (pseudonormalization).  Normal left ventricular filling pressure. Right Ventricle: The right ventricular size is normal. No increase in right ventricular wall thickness. Right ventricular systolic function is normal. Left Atrium: Left atrial size was moderately dilated. Right Atrium: Right atrial size was mildly dilated. Pericardium: There is no evidence of pericardial effusion. Mitral Valve: The mitral valve is degenerative in appearance. No evidence of mitral valve regurgitation. Tricuspid Valve: The tricuspid valve is normal in structure. Tricuspid valve  regurgitation is not demonstrated. Aortic Valve: The aortic valve was not well visualized. There is moderate calcification of the aortic valve. There is moderate thickening of the aortic valve. Aortic valve regurgitation is not visualized. Moderate aortic stenosis is present. Aortic valve  mean gradient measures 19.0 mmHg. Aortic valve peak gradient measures 37.9 mmHg. Aortic valve area, by VTI measures 1.46 cm. Pulmonic Valve: The pulmonic valve was not well visualized. Pulmonic valve regurgitation is not visualized. Aorta: The aortic root is normal in size and structure. Venous: IVC assessment for right atrial pressure unable to be performed due to mechanical ventilation. IAS/Shunts: No atrial level shunt detected by color flow Doppler.  LEFT VENTRICLE PLAX 2D LVIDd:         4.10 cm  Diastology LVIDs:         2.90 cm  LV e' medial:    3.48 cm/s LV PW:         1.20 cm  LV E/e' medial:  24.3 LV IVS:        1.40 cm  LV e' lateral:   10.10 cm/s LVOT diam:     2.00 cm  LV E/e' lateral: 8.4 LV SV:         73 LV SV Index:   29 LVOT Area:     3.14 cm  RIGHT VENTRICLE RV S prime:     13.40 cm/s TAPSE (M-mode): 2.3 cm LEFT ATRIUM              Index       RIGHT ATRIUM           Index LA diam:        4.25 cm  1.70 cm/m  RA Area:     23.10 cm LA Vol (A2C):   90.8 ml  36.28 ml/m RA Volume:   79.00 ml  31.56 ml/m LA Vol (A4C):   116.5 ml 46.54 ml/m LA Biplane Vol: 95.8 ml  38.27 ml/m  AORTIC  VALVE AV Area (Vmax):    1.20 cm AV Area (Vmean):   1.36 cm AV Area (VTI):     1.46 cm AV Vmax:           308.00 cm/s AV Vmean:          196.667 cm/s AV VTI:            0.503 m AV Peak Grad:      37.9 mmHg AV Mean Grad:      19.0 mmHg LVOT Vmax:         118.00 cm/s LVOT Vmean:        85.000 cm/s LVOT VTI:          0.233 m LVOT/AV VTI ratio: 0.46  AORTA Ao Root diam: 3.10 cm MITRAL VALVE MV Area (PHT): 2.76 cm    SHUNTS MV Decel Time: 275 msec    Systemic VTI:  0.23 m MV E velocity: 84.40 cm/s  Systemic Diam: 2.00 cm MV A velocity: 31.50 cm/s MV E/A ratio:  2.68 Mihai Croitoru MD Electronically signed by Thurmon Fair MD Signature Date/Time: 03/06/2020/1:15:08 PM    Final       Subjective: Pt without complaints today.  He remains agreeable to going home with hospice services.  Wife at bedside agreeable to providing his care.   Discharge Exam: Vitals:   03/12/20 2159 03/13/20 0410  BP: (!) 150/125 (!) 142/71  Pulse: (!) 101 (!) 105  Resp: 16 20  Temp: (!) 97.5 F (36.4 C) (!) 97.1  F (36.2 C)  SpO2: (!) 86% 100%   Vitals:   03/12/20 1316 03/12/20 1406 03/12/20 2159 03/13/20 0410  BP: 105/69 107/70 (!) 150/125 (!) 142/71  Pulse: (!) 101 93 (!) 101 (!) 105  Resp: 18 18 16 20   Temp:  97.6 F (36.4 C) (!) 97.5 F (36.4 C) (!) 97.1 F (36.2 C)  TempSrc:  Oral    SpO2: 100% 99% (!) 86% 100%  Weight:      Height:       General exam: Pt appears chronically ill. He is awake and alert.  Respiratory system: BBS, no wheeze or rales heard.  Cardiovascular system: normal s1, s2 sounds. Irregularly irregular.  Gastrointestinal system: morbidly obese, soft, nondistended, normal BS.   Central nervous system: no gross focal findings.  Extremities: 1+ edema BLEs.  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  Psychiatry: Judgement and insight appear normal. Mood & affect appropriate.    The results of significant diagnostics from this hospitalization (including  imaging, microbiology, ancillary and laboratory) are listed below for reference.    Microbiology: Recent Results (from the past 240 hour(s))  Resp Panel by RT-PCR (Flu A&B, Covid) Nasopharyngeal Swab     Status: None   Collection Time: 03/05/20  6:40 PM   Specimen: Nasopharyngeal Swab; Nasopharyngeal(NP) swabs in vial transport medium  Result Value Ref Range Status   SARS Coronavirus 2 by RT PCR NEGATIVE NEGATIVE Final    Comment: (NOTE) SARS-CoV-2 target nucleic acids are NOT DETECTED.  The SARS-CoV-2 RNA is generally detectable in upper respiratory specimens during the acute phase of infection. The lowest concentration of SARS-CoV-2 viral copies this assay can detect is 138 copies/mL. A negative result does not preclude SARS-Cov-2 infection and should not be used as the sole basis for treatment or other patient management decisions. A negative result may occur with  improper specimen collection/handling, submission of specimen other than nasopharyngeal swab, presence of viral mutation(s) within the areas targeted by this assay, and inadequate number of viral copies(<138 copies/mL). A negative result must be combined with clinical observations, patient history, and epidemiological information. The expected result is Negative.  Fact Sheet for Patients:  03/07/20  Fact Sheet for Healthcare Providers:  BloggerCourse.com  This test is no t yet approved or cleared by the [employer] FDA and  has been authorized for detection and/or diagnosis of SARS-CoV-2 by FDA under an Emergency Use Authorization (EUA). This EUA will remain  in effect (meaning this test can be used) for the duration of the COVID-19 declaration under Section 564(b)(1) of the Act, 21 U.S.C.section 360bbb-3(b)(1), unless the authorization is terminated  or revoked sooner.       Influenza A by PCR NEGATIVE NEGATIVE Final   Influenza B by PCR NEGATIVE  NEGATIVE Final    Comment: (NOTE) The Xpert Xpress SARS-CoV-2/FLU/RSV plus assay is intended as an aid in the diagnosis of influenza from Nasopharyngeal swab specimens and should not be used as a sole basis for treatment. Nasal washings and aspirates are unacceptable for Xpert Xpress SARS-CoV-2/FLU/RSV testing.  Fact Sheet for Patients: [place]  Fact Sheet for Healthcare Providers: BloggerCourse.com  This test is not yet approved or cleared by the [employer] FDA and has been authorized for detection and/or diagnosis of SARS-CoV-2 by FDA under an Emergency Use Authorization (EUA). This EUA will remain in effect (meaning this test can be used) for the duration of the COVID-19 declaration under Section 564(b)(1) of the Act, 21 U.S.C.  section 360bbb-3(b)(1), unless the authorization is terminated or revoked.  Performed at [facility], [address] [place], [place] [address]   Blood culture (routine single)     Status: None   Collection Time: 03/05/20  6:44 PM   Specimen: BLOOD LEFT ARM  Result Value Ref Range Status   Specimen Description BLOOD LEFT ARM  Final   Special Requests   Final    BOTTLES DRAWN AEROBIC AND ANAEROBIC Blood Culture adequate volume   Culture   Final    NO GROWTH 6 DAYS Performed at [facility], [address] [place], [place] [address]    Report Status 03/11/2020 FINAL  Final  Urine culture     Status: None   Collection Time: 03/05/20  8:12 PM   Specimen: In/Out Cath Urine  Result Value Ref Range Status   Specimen Description   Final    IN/OUT CATH URINE Performed at [facility], [address], [place], [place] [address]    Special Requests   Final    NONE Performed at [facility], [address], [place], [place] [address]    Culture   Final    NO GROWTH Performed at [facility], [address] [place], [place] [address]    Report Status 03/07/2020 FINAL  Final  MRSA PCR  Screening     Status: Abnormal   Collection Time: 03/05/20 11:28 PM   Specimen: Nasal Mucosa; Nasopharyngeal  Result Value Ref Range Status   MRSA by PCR POSITIVE (A) NEGATIVE Final    Comment:        The GeneXpert MRSA Assay (FDA approved for NASAL specimens only), is one component of a comprehensive MRSA colonization surveillance program. It is not intended to diagnose MRSA infection nor to guide or monitor treatment for MRSA infections. RESULT CALLED TO, READ BACK BY AND VERIFIED WITH: [name],J @ 0221 ON 03/06/20 BY JUW Performed at [facility], [address] [place], [place] [address]      Labs: BNP (last 3 results) Recent Labs    06/02/19 1645 12/03/19 1947 03/05/20 1844  BNP 241.0* 178.0* 190.0*   Basic Metabolic Panel: Recent Labs  Lab 03/08/20 1317 03/10/20 0509 03/11/20 0314 03/12/20 0448 03/13/20 0529  NA 139 139 137 138 140  K 3.2* 2.5* 2.8* 3.5 3.5  CL 94* 91* 88* 86* 88*  CO2 30 38* 42* 42* 41*  GLUCOSE 103* 121* 141* 122* 114*  BUN 33* 34* 29* 25* 21  CREATININE 1.04 0.95 0.81 0.75 0.67  CALCIUM 8.2* 8.0* 8.0* 8.2* 8.8*  MG  --  1.6* 2.3 1.9 1.9  PHOS 3.6  --   --   --   --    Liver Function Tests: Recent Labs  Lab 03/08/20 1317 03/11/20 0314 03/12/20 0448 03/13/20 0529  AST 24 36 36 29  ALT 11 18 21 20   ALKPHOS 65 82 92 96  BILITOT 1.5* 0.5 0.5 0.7  PROT 6.0* 5.9* 6.1* 6.6  ALBUMIN 2.3* 2.3* 2.5* 2.7*   No results for input(s): LIPASE, AMYLASE in the last 168 hours. No results for input(s): AMMONIA in the last 168 hours. CBC: Recent Labs  Lab 03/08/20 1317 03/10/20 0509 03/11/20 0314 03/12/20 0448 03/13/20 0529  WBC 20.4* 15.1* 12.0* 12.1* 13.0*  HGB 9.0* 8.5* 8.0* 8.5* 8.9*  HCT 32.2* 32.6* 29.8* 30.9* 33.4*  MCV 83.6 88.3 86.4 86.1 86.5  PLT 302 290 263 271 334   Cardiac Enzymes: No results for input(s): CKTOTAL, CKMB, CKMBINDEX,  TROPONINI in the last 168 hours. BNP: Invalid input(s): POCBNP CBG: Recent Labs  Lab  03/12/20 0730 03/12/20 1106 03/12/20 1613 03/13/20 0719 03/13/20 1140  GLUCAP 108* 109* 140* 109* 177*   D-Dimer No results for input(s): DDIMER in the last 72 hours. Hgb A1c No results for input(s): HGBA1C in the last 72 hours. Lipid Profile No results for input(s): CHOL, HDL, LDLCALC, TRIG, CHOLHDL, LDLDIRECT in the last 72 hours. Thyroid function studies No results for input(s): TSH, T4TOTAL, T3FREE, THYROIDAB in the last 72 hours.  Invalid input(s): FREET3 Anemia work up No results for input(s): VITAMINB12, FOLATE, FERRITIN, TIBC, IRON, RETICCTPCT in the last 72 hours. Urinalysis    Component Value Date/Time   COLORURINE YELLOW 03/05/2020 2012   APPEARANCEUR CLOUDY (A) 03/05/2020 2012   LABSPEC 1.016 03/05/2020 2012   PHURINE 5.0 03/05/2020 2012   GLUCOSEU NEGATIVE 03/05/2020 2012   HGBUR NEGATIVE 03/05/2020 2012   BILIRUBINUR NEGATIVE 03/05/2020 2012   KETONESUR NEGATIVE 03/05/2020 2012   PROTEINUR 100 (A) 03/05/2020 2012   UROBILINOGEN 0.2 03/15/2014 1254   NITRITE NEGATIVE 03/05/2020 2012   LEUKOCYTESUR NEGATIVE 03/05/2020 2012   Sepsis Labs Invalid input(s): PROCALCITONIN,  WBC,  LACTICIDVEN Microbiology Recent Results (from the past 240 hour(s))  Resp Panel by RT-PCR (Flu A&B, Covid) Nasopharyngeal Swab     Status: None   Collection Time: 03/05/20  6:40 PM   Specimen: Nasopharyngeal Swab; Nasopharyngeal(NP) swabs in vial transport medium  Result Value Ref Range Status   SARS Coronavirus 2 by RT PCR NEGATIVE NEGATIVE Final    Comment: (NOTE) SARS-CoV-2 target nucleic acids are NOT DETECTED.  The SARS-CoV-2 RNA is generally detectable in upper respiratory specimens during the acute phase of infection. The lowest concentration of SARS-CoV-2 viral copies this assay can detect is 138 copies/mL. A negative result does not preclude SARS-Cov-2 infection and should not be used as the sole basis for treatment or other patient management decisions. A negative result  may occur with  improper specimen collection/handling, submission of specimen other than nasopharyngeal swab, presence of viral mutation(s) within the areas targeted by this assay, and inadequate number of viral copies(<138 copies/mL). A negative result must be combined with clinical observations, patient history, and epidemiological information. The expected result is Negative.  Fact Sheet for Patients:  BloggerCourse.com  Fact Sheet for Healthcare Providers:  SeriousBroker.it  This test is no t yet approved or cleared by the [place] FDA and  has been authorized for detection and/or diagnosis of SARS-CoV-2 by FDA under an Emergency Use Authorization (EUA). This EUA will remain  in effect (meaning this test can be used) for the duration of the COVID-19 declaration under Section 564(b)(1) of the Act, 21 U.S.C.section 360bbb-3(b)(1), unless the authorization is terminated  or revoked sooner.       Influenza A by PCR NEGATIVE NEGATIVE Final   Influenza B by PCR NEGATIVE NEGATIVE Final    Comment: (NOTE) The Xpert Xpress SARS-CoV-2/FLU/RSV plus assay is intended as an aid in the diagnosis of influenza from Nasopharyngeal swab specimens and should not be used as a sole basis for treatment. Nasal washings and aspirates are unacceptable for Xpert Xpress SARS-CoV-2/FLU/RSV testing.  Fact Sheet for Patients: BloggerCourse.com  Fact Sheet for Healthcare Providers: SeriousBroker.it  This test is not yet approved or cleared by the [place] FDA and has been authorized for detection and/or diagnosis of SARS-CoV-2 by FDA under an Emergency Use Authorization (EUA). This EUA will remain in effect (meaning this test can be used)  for the duration of the COVID-19 declaration under Section 564(b)(1) of the Act, 21 U.S.C. section 360bbb-3(b)(1), unless the authorization is terminated  or revoked.  Performed at [facility], [address] [place], [place] [address]   Blood culture (routine single)     Status: None   Collection Time: 03/05/20  6:44 PM   Specimen: BLOOD LEFT ARM  Result Value Ref Range Status   Specimen Description BLOOD LEFT ARM  Final   Special Requests   Final    BOTTLES DRAWN AEROBIC AND ANAEROBIC Blood Culture adequate volume   Culture   Final    NO GROWTH 6 DAYS Performed at [facility], [address] [place], [place] [address]    Report Status 03/11/2020 FINAL  Final  Urine culture     Status: None   Collection Time: 03/05/20  8:12 PM   Specimen: In/Out Cath Urine  Result Value Ref Range Status   Specimen Description   Final    IN/OUT CATH URINE Performed at [facility], [address] [place], [place] [address]    Special Requests   Final    NONE Performed at [facility], [address] [place], [place] [address]    Culture   Final    NO GROWTH Performed at [facility], [address] [place], [place] [address]    Report Status 03/07/2020 FINAL  Final  MRSA PCR Screening     Status: Abnormal   Collection Time: 03/05/20 11:28 PM   Specimen: Nasal Mucosa; Nasopharyngeal  Result Value Ref Range Status   MRSA by PCR POSITIVE (A) NEGATIVE Final    Comment:        The GeneXpert MRSA Assay (FDA approved for NASAL specimens only), is one component of a comprehensive MRSA colonization surveillance program. It is not intended to diagnose MRSA infection nor to guide or monitor treatment for MRSA infections. RESULT CALLED TO, READ BACK BY AND VERIFIED WITH: [name],J @ 0221 ON 03/06/20 BY JUW Performed at [facility], [address] [place], [place] [address]    Time coordinating discharge: 38 minutes  SIGNED:  Standley Dakinslanford Rennae Ferraiolo, MD  [facility] 03/13/2020, 12:12 PM How to contact the [facility] Attending or Consulting provider 7A - 7P or covering provider during after hours 7P -7A, for this patient?  1. Check the  care team in [facility] and look for a) attending/consulting [facility] provider listed and b) the [facility] team listed 2. Log into [email or web address] and use [facility]'s universal password to access. If you do not have the password, please contact the hospital operator. 3. Locate the [facility] provider you are looking for under [facility] and page to a number that you can be directly reached. 4. If you still have difficulty reaching the provider, please page the [facility] (Director on Call) for the Hospitalists listed on amion for assistance.

## 2021-11-11 IMAGING — CT CT HEAD W/O CM
3 series · 16 of 47 positions shown, 19 images · non-contrast
Comparison: 07/20/2017

CLINICAL DATA: Altered level of consciousness, lethargy, intubated

EXAM:
CT HEAD WITHOUT CONTRAST
TECHNIQUE: Contiguous axial images were obtained from the base of the skull
through the vertex without intravenous contrast.

[Series 2: head w o · axial · 0.45mm/px · z∈[-139,+11]mm · 10 of 36 slices shown, 13 images]
[im 3/36  brain]
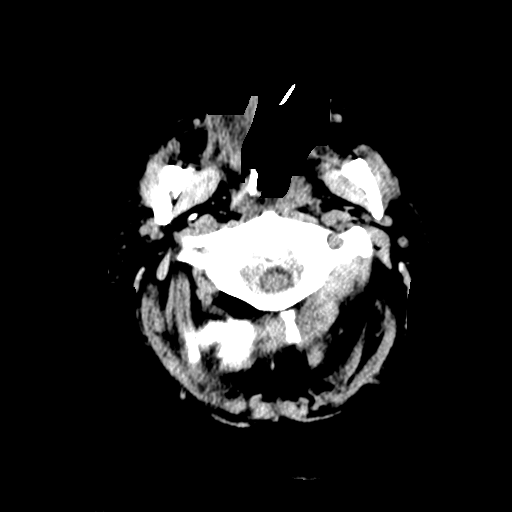
[im 3/36  bone]
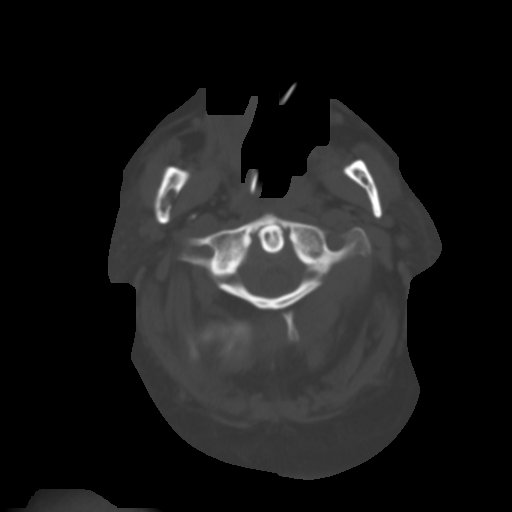
[im 7/36  brain]
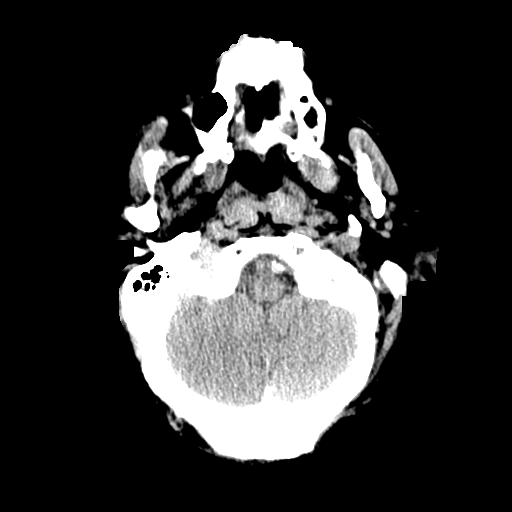
[im 10/36  brain]
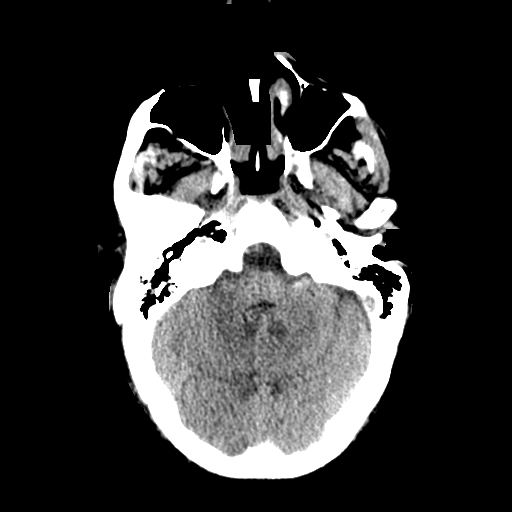
[im 13/36  brain]
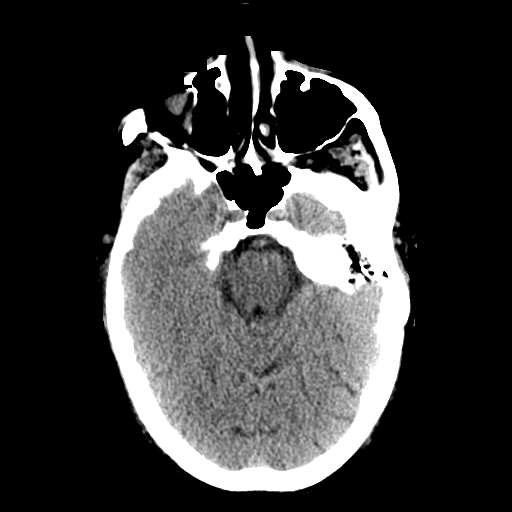
[im 16/36  brain]
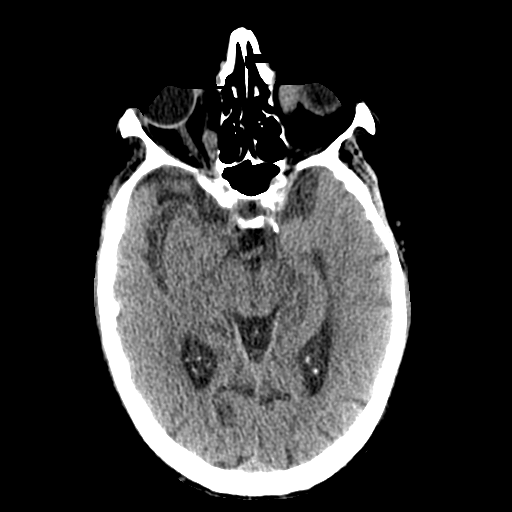
[im 16/36  bone]
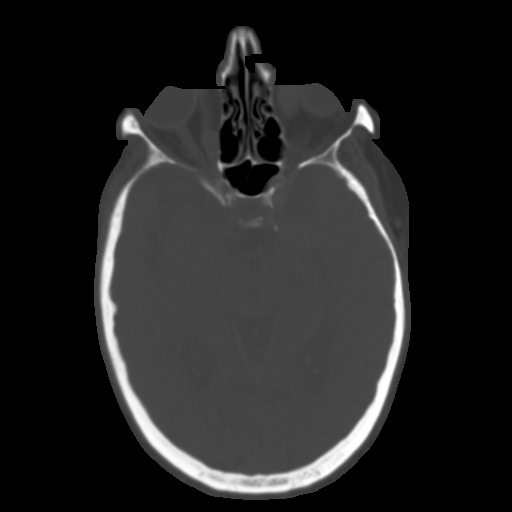
[im 20/36  brain]
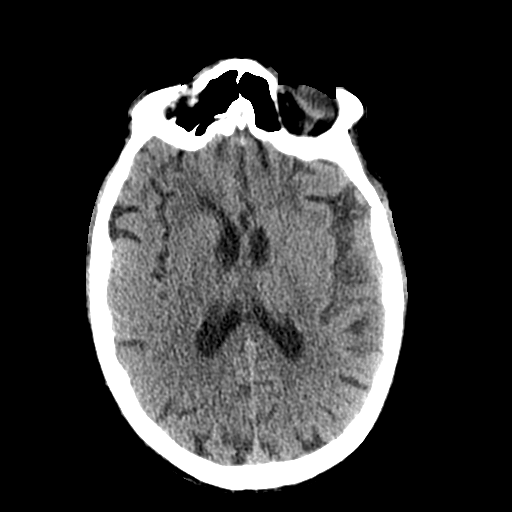
[im 23/36  brain]
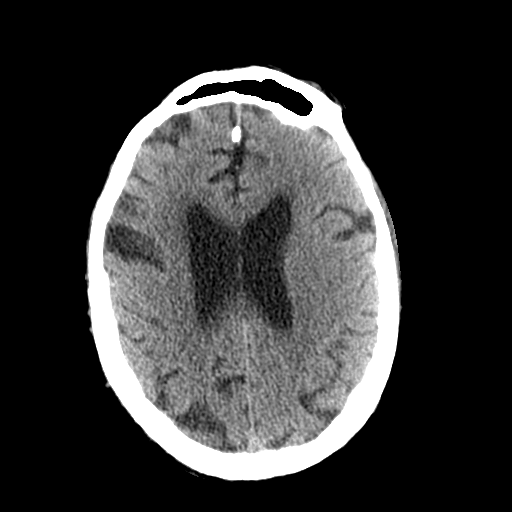
[im 27/36  brain]
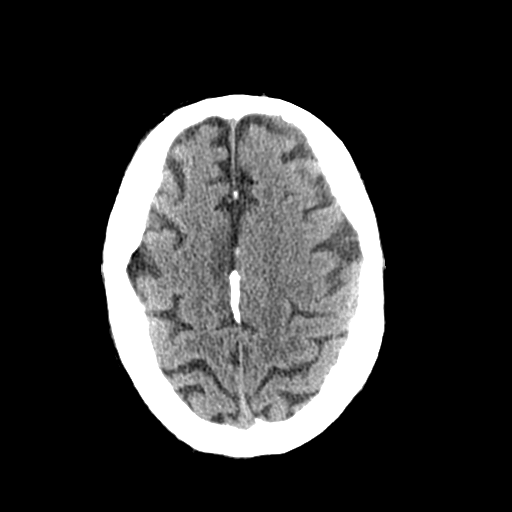
[im 29/36  brain]
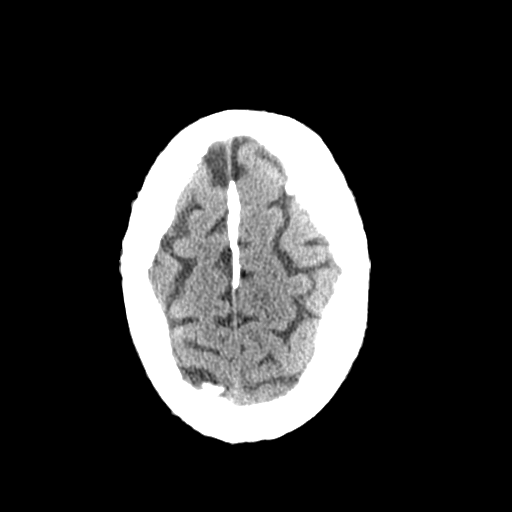
[im 29/36  bone]
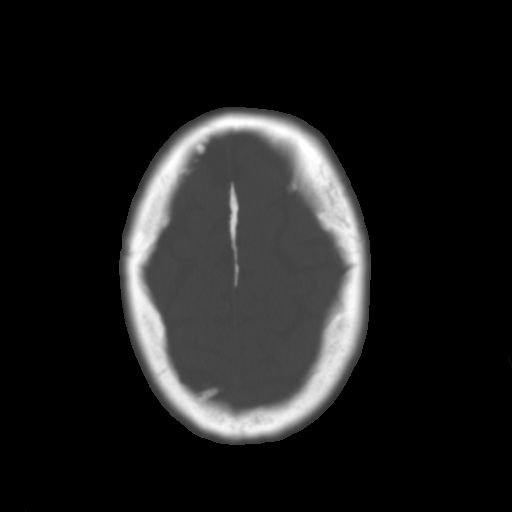
[im 33/36  brain]
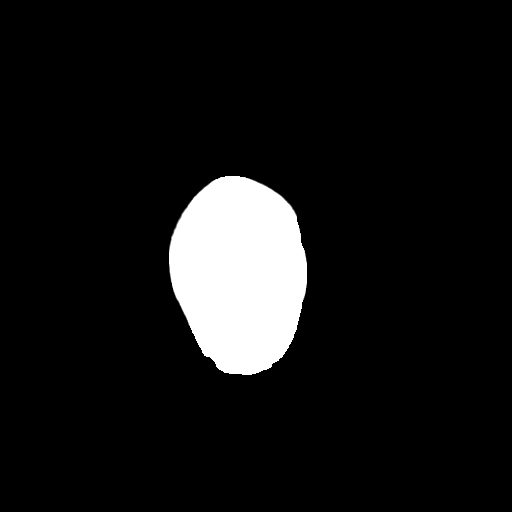

[Series 4: coronal soft · coronal · 0.39mm/px · 3 of 80 slices shown]
[im 27/80  brain]
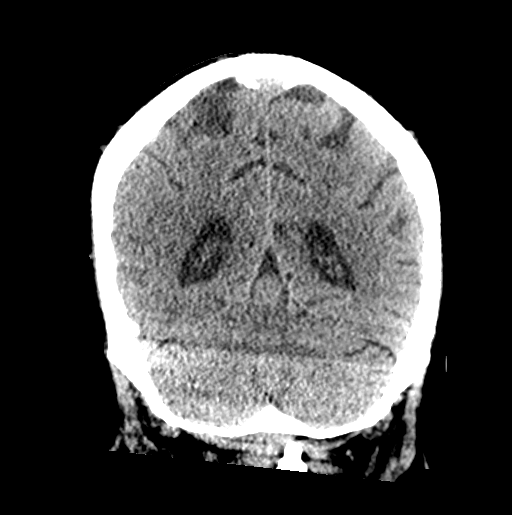
[im 36/80  brain]
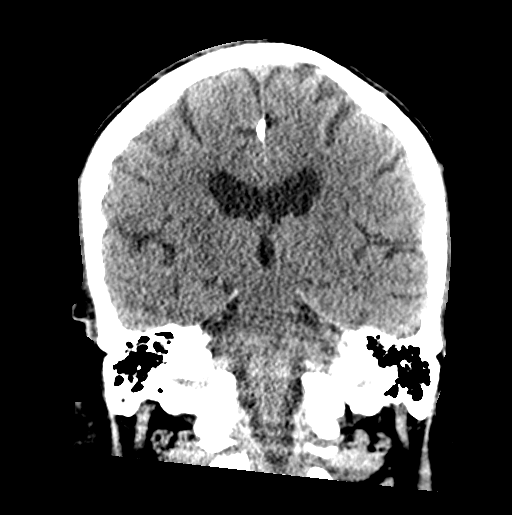
[im 44/80  brain]
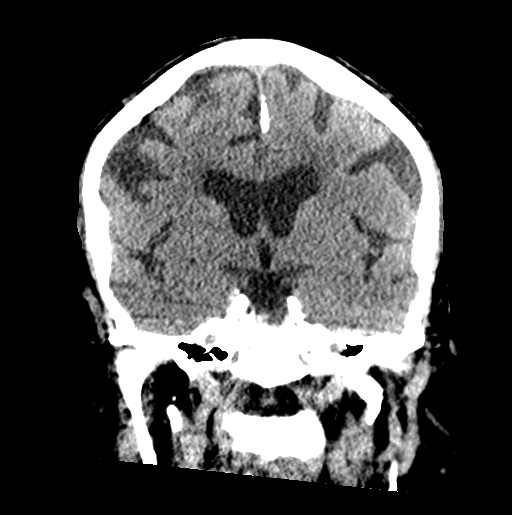

[Series 5: sagittal soft · sagittal · 0.36mm/px · 3 of 64 slices shown]
[im 22/64  brain]
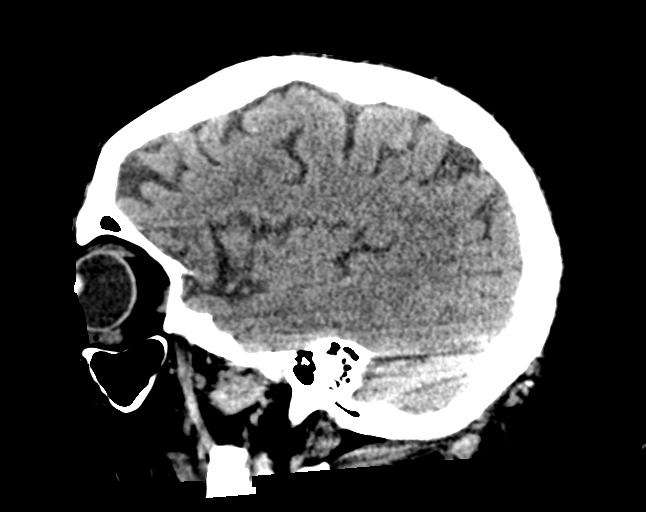
[im 32/64  brain]
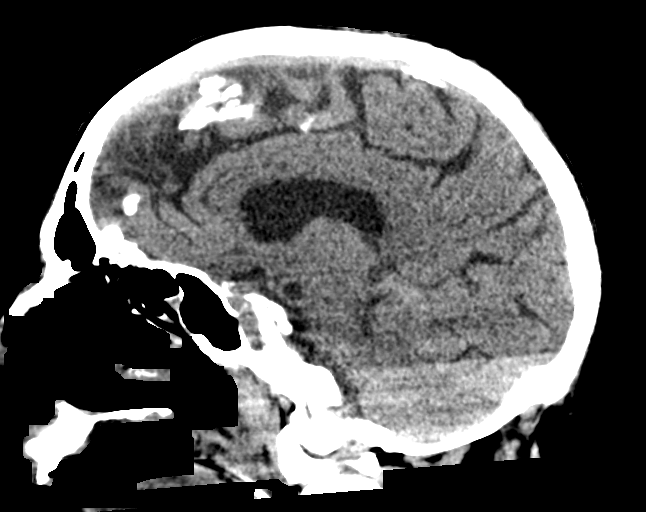
[im 43/64  brain]
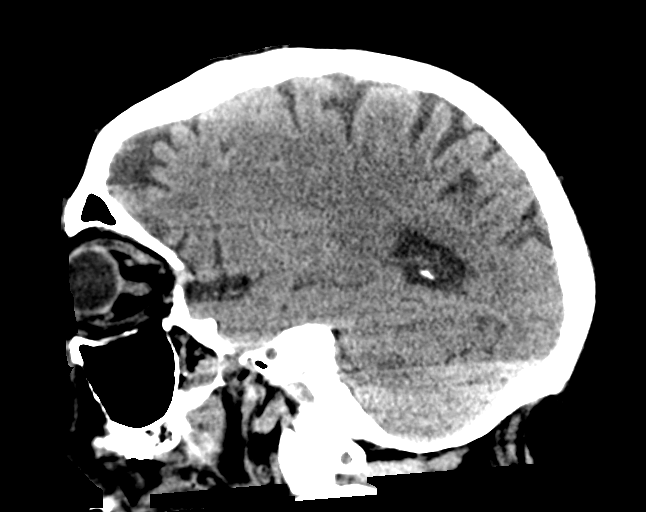

[16 of 47 positions shown; findings below may reference images not displayed]

FINDINGS: Brain: No acute infarct or hemorrhage. Lateral ventricles and
midline structures are unremarkable. No acute extra-axial fluid
collections. No mass effect.

Vascular: No hyperdense vessel or unexpected calcification.

Skull: Normal. Negative for fracture or focal lesion.

Sinuses/Orbits: No acute finding.

Other: None.
IMPRESSION: 1. No acute intracranial process.

## 2021-11-11 IMAGING — DX DG CHEST 1V PORT
4 series · 4 of 4 positions shown · non-contrast
Comparison: Earlier radiograph dated 10/19/2019.

CLINICAL DATA: 73-year-old male status post enteric tube placement.

EXAM:
PORTABLE CHEST 1 VIEW

[chest ap grid (1 of 4)]
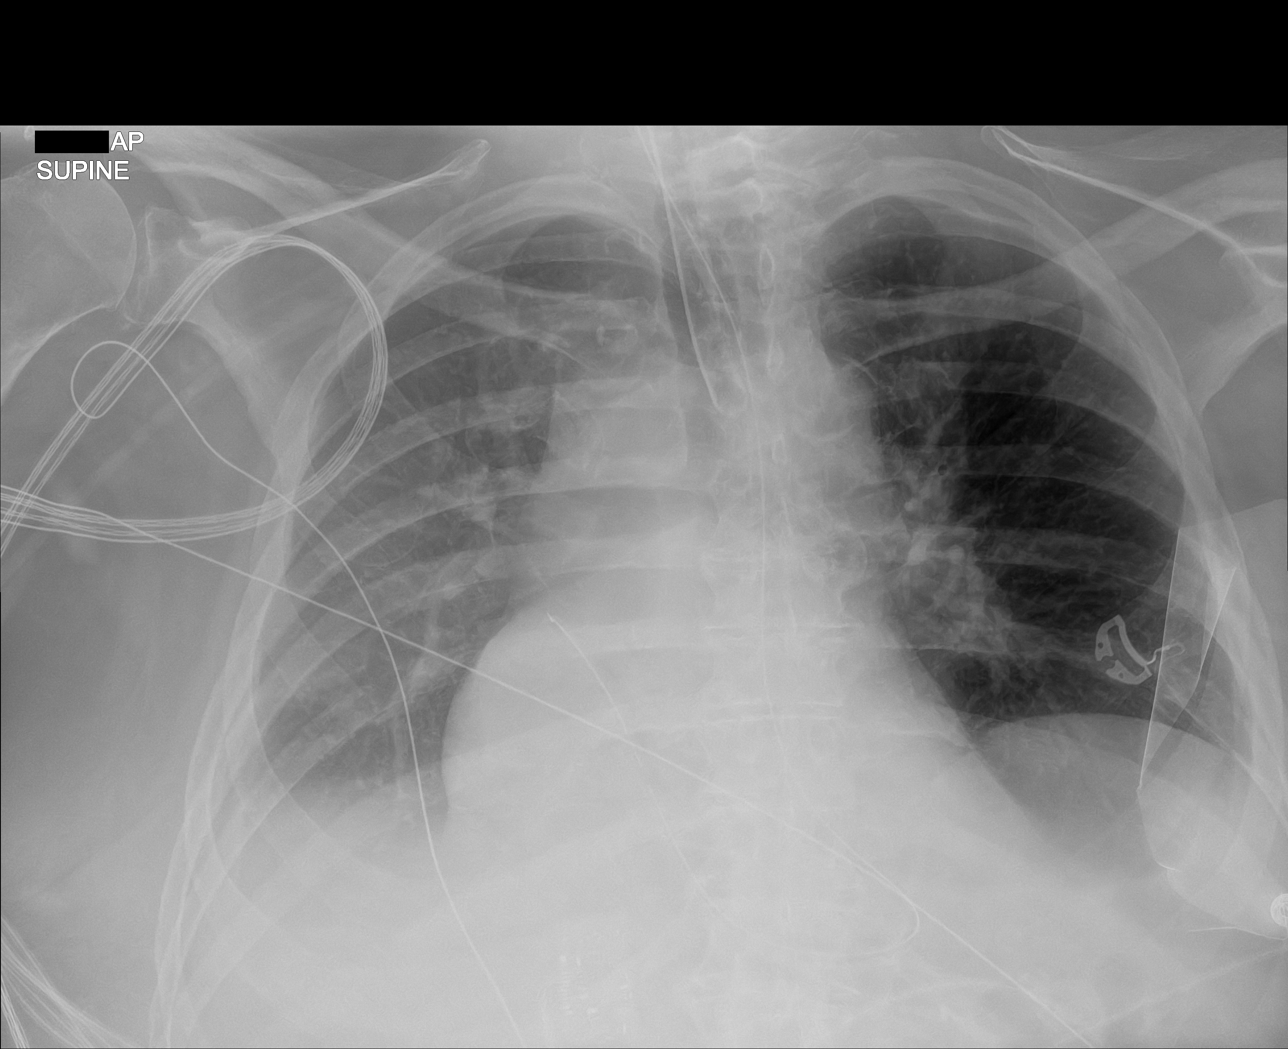

[chest ap grid (2 of 4)]
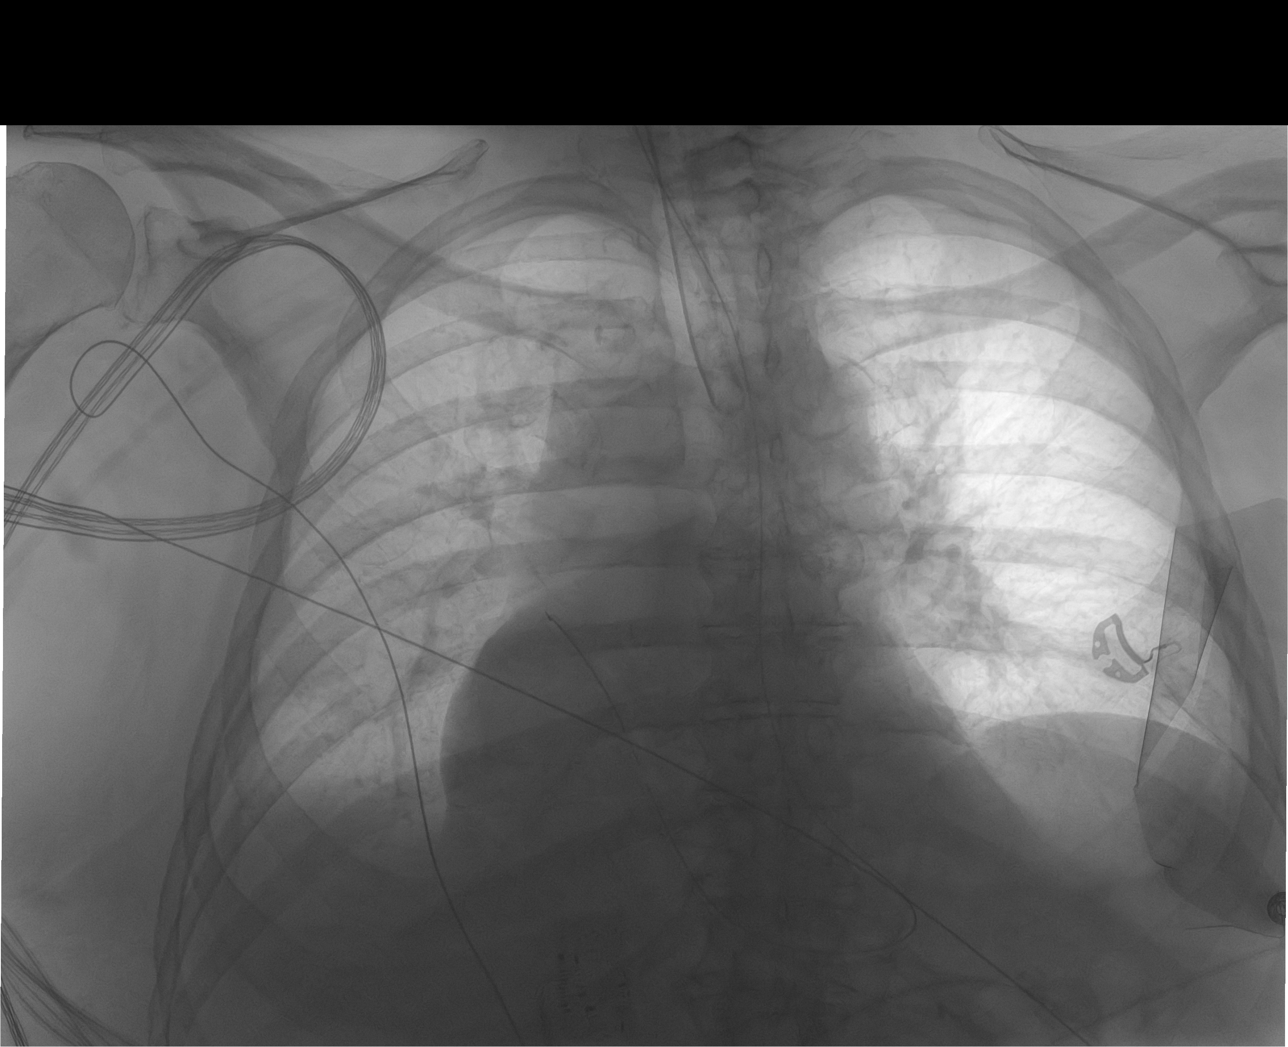

[chest ap grid (3 of 4)]
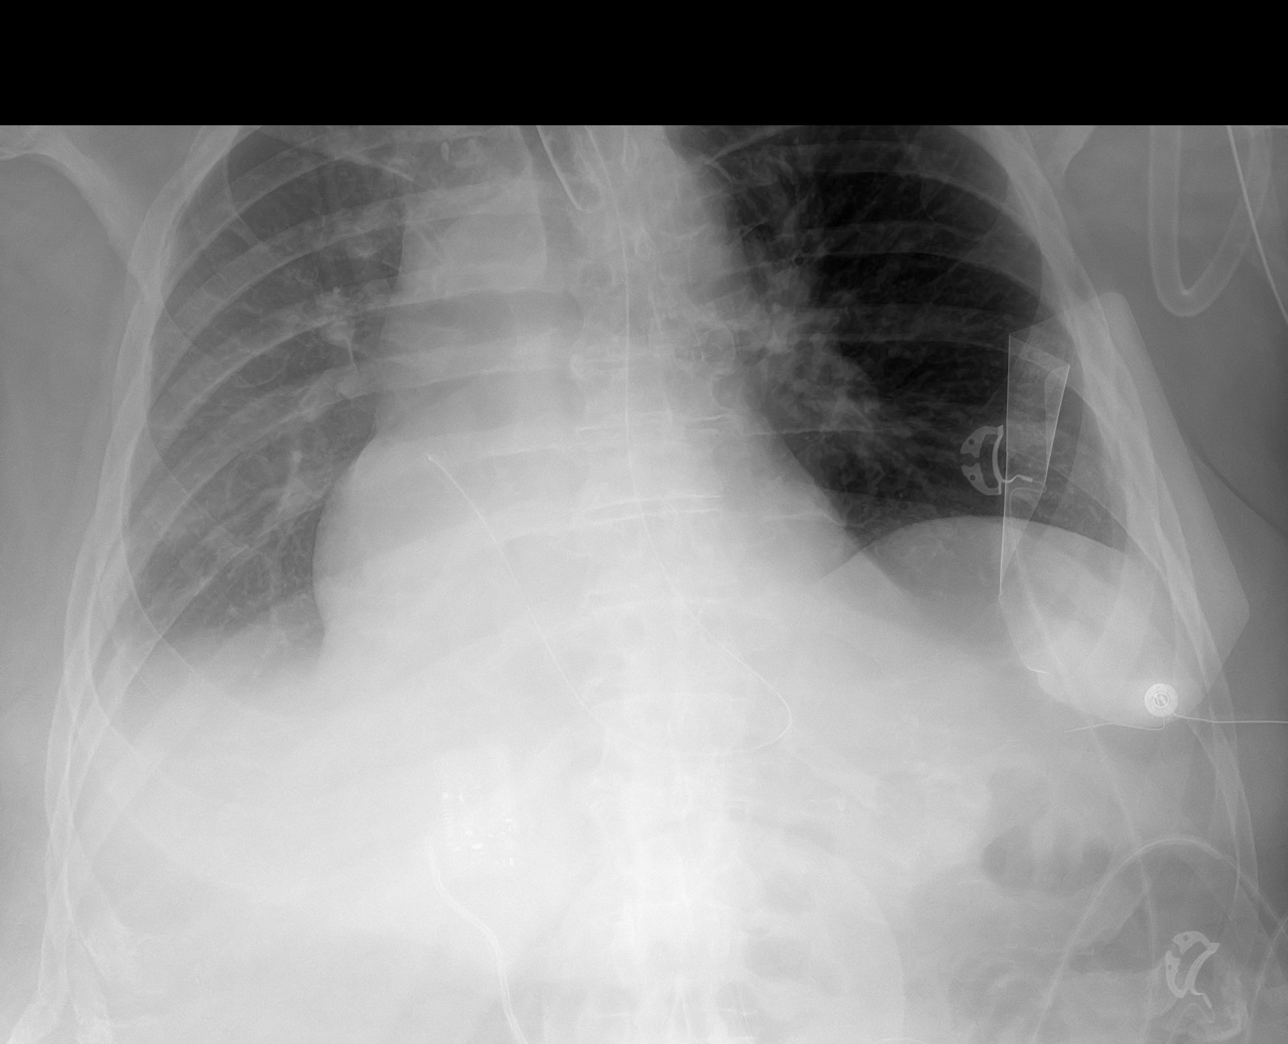

[chest ap grid (4 of 4)]
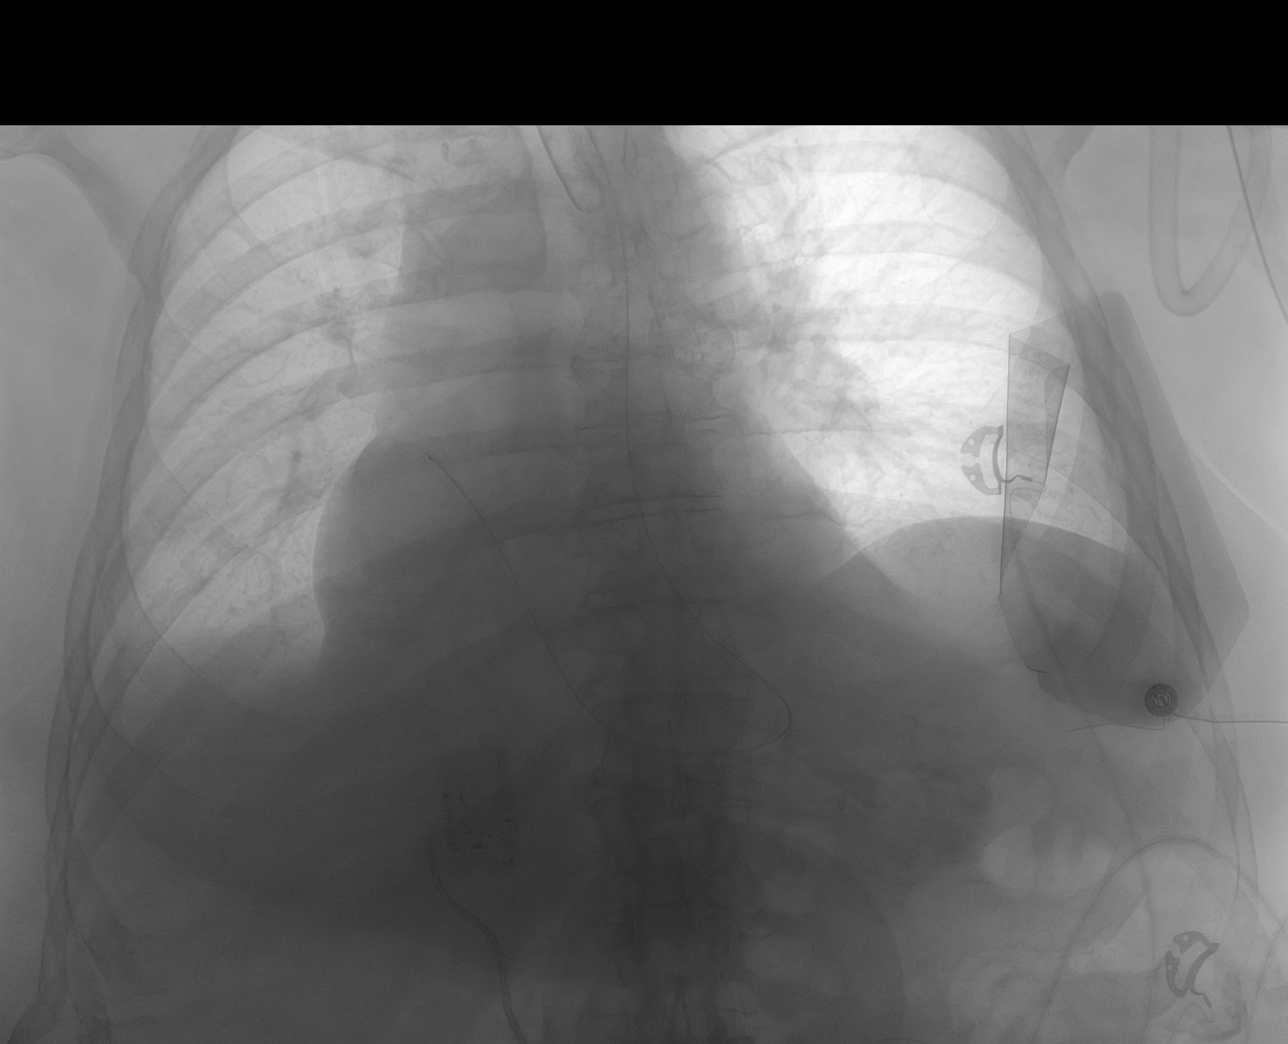

[4 of 4 positions shown; findings below may reference images not displayed]

FINDINGS: Endotracheal tube with tip approximately 3 cm above the carina.
There has been interval placement of an enteric tube which appears
to extend into the distal esophagus and possibly below the
diaphragm. The tube however curves back with tip and side-port
projecting over the right heart. This may be related to the presence
of a hiatal hernia. Lateral view is recommended for better
evaluation.

There is shallow inspiration. There is diffuse hazy density
throughout the right lung likely combination of layering pleural
effusion and associated atelectasis or infiltrate. The left lung is
clear. No pneumothorax. Stable cardiomegaly. No acute osseous
pathology.
IMPRESSION: 1. Endotracheal tube above the carina.
2. Interval placement of an enteric tube with tip and side-port
projecting over the right heart. This may be related to the presence
of a hiatal hernia. Lateral view is recommended for better
evaluation.
3. Right pleural effusion and associated atelectasis or infiltrate.

## 2022-06-16 ENCOUNTER — Other Ambulatory Visit: Payer: Self-pay

## 2022-06-16 ENCOUNTER — Inpatient Hospital Stay (HOSPITAL_COMMUNITY)
Admission: EM | Admit: 2022-06-16 | Discharge: 2022-07-10 | DRG: 683 | Disposition: E | Attending: Family Medicine | Admitting: Family Medicine

## 2022-06-16 ENCOUNTER — Emergency Department (HOSPITAL_COMMUNITY)

## 2022-06-16 ENCOUNTER — Encounter (HOSPITAL_COMMUNITY): Payer: Self-pay

## 2022-06-16 DIAGNOSIS — J9611 Chronic respiratory failure with hypoxia: Secondary | ICD-10-CM | POA: Diagnosis present

## 2022-06-16 DIAGNOSIS — K920 Hematemesis: Secondary | ICD-10-CM | POA: Diagnosis present

## 2022-06-16 DIAGNOSIS — Z7901 Long term (current) use of anticoagulants: Secondary | ICD-10-CM

## 2022-06-16 DIAGNOSIS — E119 Type 2 diabetes mellitus without complications: Secondary | ICD-10-CM | POA: Diagnosis present

## 2022-06-16 DIAGNOSIS — E669 Obesity, unspecified: Secondary | ICD-10-CM | POA: Diagnosis present

## 2022-06-16 DIAGNOSIS — L89152 Pressure ulcer of sacral region, stage 2: Secondary | ICD-10-CM | POA: Diagnosis present

## 2022-06-16 DIAGNOSIS — E782 Mixed hyperlipidemia: Secondary | ICD-10-CM | POA: Diagnosis present

## 2022-06-16 DIAGNOSIS — L97829 Non-pressure chronic ulcer of other part of left lower leg with unspecified severity: Secondary | ICD-10-CM | POA: Diagnosis present

## 2022-06-16 DIAGNOSIS — Z7189 Other specified counseling: Secondary | ICD-10-CM | POA: Diagnosis not present

## 2022-06-16 DIAGNOSIS — I10 Essential (primary) hypertension: Secondary | ICD-10-CM | POA: Diagnosis present

## 2022-06-16 DIAGNOSIS — L89316 Pressure-induced deep tissue damage of right buttock: Secondary | ICD-10-CM | POA: Diagnosis present

## 2022-06-16 DIAGNOSIS — Z1152 Encounter for screening for COVID-19: Secondary | ICD-10-CM | POA: Diagnosis not present

## 2022-06-16 DIAGNOSIS — I482 Chronic atrial fibrillation, unspecified: Secondary | ICD-10-CM | POA: Diagnosis present

## 2022-06-16 DIAGNOSIS — Z515 Encounter for palliative care: Secondary | ICD-10-CM | POA: Diagnosis not present

## 2022-06-16 DIAGNOSIS — Z833 Family history of diabetes mellitus: Secondary | ICD-10-CM

## 2022-06-16 DIAGNOSIS — R197 Diarrhea, unspecified: Secondary | ICD-10-CM | POA: Diagnosis not present

## 2022-06-16 DIAGNOSIS — E860 Dehydration: Secondary | ICD-10-CM | POA: Diagnosis present

## 2022-06-16 DIAGNOSIS — L97909 Non-pressure chronic ulcer of unspecified part of unspecified lower leg with unspecified severity: Secondary | ICD-10-CM | POA: Diagnosis present

## 2022-06-16 DIAGNOSIS — E440 Moderate protein-calorie malnutrition: Secondary | ICD-10-CM | POA: Diagnosis present

## 2022-06-16 DIAGNOSIS — K922 Gastrointestinal hemorrhage, unspecified: Secondary | ICD-10-CM | POA: Insufficient documentation

## 2022-06-16 DIAGNOSIS — I872 Venous insufficiency (chronic) (peripheral): Secondary | ICD-10-CM | POA: Diagnosis present

## 2022-06-16 DIAGNOSIS — N390 Urinary tract infection, site not specified: Secondary | ICD-10-CM | POA: Diagnosis present

## 2022-06-16 DIAGNOSIS — I4891 Unspecified atrial fibrillation: Secondary | ICD-10-CM | POA: Diagnosis not present

## 2022-06-16 DIAGNOSIS — F419 Anxiety disorder, unspecified: Secondary | ICD-10-CM | POA: Diagnosis present

## 2022-06-16 DIAGNOSIS — I959 Hypotension, unspecified: Secondary | ICD-10-CM | POA: Diagnosis present

## 2022-06-16 DIAGNOSIS — L97416 Non-pressure chronic ulcer of right heel and midfoot with bone involvement without evidence of necrosis: Secondary | ICD-10-CM | POA: Diagnosis not present

## 2022-06-16 DIAGNOSIS — L89322 Pressure ulcer of left buttock, stage 2: Secondary | ICD-10-CM | POA: Diagnosis present

## 2022-06-16 DIAGNOSIS — Z825 Family history of asthma and other chronic lower respiratory diseases: Secondary | ICD-10-CM

## 2022-06-16 DIAGNOSIS — E46 Unspecified protein-calorie malnutrition: Secondary | ICD-10-CM | POA: Diagnosis not present

## 2022-06-16 DIAGNOSIS — I83009 Varicose veins of unspecified lower extremity with ulcer of unspecified site: Secondary | ICD-10-CM | POA: Diagnosis present

## 2022-06-16 DIAGNOSIS — E8809 Other disorders of plasma-protein metabolism, not elsewhere classified: Secondary | ICD-10-CM | POA: Diagnosis present

## 2022-06-16 DIAGNOSIS — N3000 Acute cystitis without hematuria: Secondary | ICD-10-CM | POA: Diagnosis not present

## 2022-06-16 DIAGNOSIS — Z87891 Personal history of nicotine dependence: Secondary | ICD-10-CM

## 2022-06-16 DIAGNOSIS — L97819 Non-pressure chronic ulcer of other part of right lower leg with unspecified severity: Secondary | ICD-10-CM | POA: Diagnosis present

## 2022-06-16 DIAGNOSIS — R112 Nausea with vomiting, unspecified: Secondary | ICD-10-CM | POA: Diagnosis not present

## 2022-06-16 DIAGNOSIS — I83014 Varicose veins of right lower extremity with ulcer of heel and midfoot: Secondary | ICD-10-CM | POA: Diagnosis not present

## 2022-06-16 DIAGNOSIS — Z66 Do not resuscitate: Secondary | ICD-10-CM | POA: Diagnosis present

## 2022-06-16 DIAGNOSIS — N179 Acute kidney failure, unspecified: Principal | ICD-10-CM

## 2022-06-16 DIAGNOSIS — Z86718 Personal history of other venous thrombosis and embolism: Secondary | ICD-10-CM

## 2022-06-16 DIAGNOSIS — L89150 Pressure ulcer of sacral region, unstageable: Secondary | ICD-10-CM | POA: Diagnosis not present

## 2022-06-16 DIAGNOSIS — L89159 Pressure ulcer of sacral region, unspecified stage: Secondary | ICD-10-CM | POA: Insufficient documentation

## 2022-06-16 DIAGNOSIS — R000 Tachycardia, unspecified: Secondary | ICD-10-CM | POA: Diagnosis present

## 2022-06-16 DIAGNOSIS — Z6837 Body mass index (BMI) 37.0-37.9, adult: Secondary | ICD-10-CM

## 2022-06-16 LAB — URINALYSIS, ROUTINE W REFLEX MICROSCOPIC
Bilirubin Urine: NEGATIVE
Glucose, UA: NEGATIVE mg/dL
Ketones, ur: NEGATIVE mg/dL
Nitrite: NEGATIVE
Protein, ur: >=300 mg/dL — AB
RBC / HPF: >50 RBC/hpf (ref 0–5)
Specific Gravity, Urine: 1.018 (ref 1.005–1.030)
WBC, UA: >50 WBC/hpf (ref 0–5)
pH: 7 (ref 5.0–8.0)

## 2022-06-16 LAB — COMPREHENSIVE METABOLIC PANEL
ALT: 12 U/L (ref 0–44)
AST: 24 U/L (ref 15–41)
Albumin: 2.5 g/dL — ABNORMAL LOW (ref 3.5–5.0)
Alkaline Phosphatase: 78 U/L (ref 38–126)
Anion gap: 13 (ref 5–15)
BUN: 45 mg/dL — ABNORMAL HIGH (ref 8–23)
CO2: 27 mmol/L (ref 22–32)
Calcium: 8 mg/dL — ABNORMAL LOW (ref 8.9–10.3)
Chloride: 94 mmol/L — ABNORMAL LOW (ref 98–111)
Creatinine, Ser: 2.6 mg/dL — ABNORMAL HIGH (ref 0.61–1.24)
GFR, Estimated: 25 mL/min — ABNORMAL LOW (ref >60)
Glucose, Bld: 153 mg/dL — ABNORMAL HIGH (ref 70–99)
Potassium: 4.3 mmol/L (ref 3.5–5.1)
Sodium: 134 mmol/L — ABNORMAL LOW (ref 135–145)
Total Bilirubin: 1.4 mg/dL — ABNORMAL HIGH (ref 0.3–1.2)
Total Protein: 6.7 g/dL (ref 6.5–8.1)

## 2022-06-16 LAB — RESP PANEL BY RT-PCR (RSV, FLU A&B, COVID)  RVPGX2
Influenza A by PCR: NEGATIVE
Influenza B by PCR: NEGATIVE
Resp Syncytial Virus by PCR: NEGATIVE
SARS Coronavirus 2 by RT PCR: NEGATIVE

## 2022-06-16 LAB — CBC
HCT: 31.3 % — ABNORMAL LOW (ref 39.0–52.0)
Hemoglobin: 7.9 g/dL — ABNORMAL LOW (ref 13.0–17.0)
MCH: 19.3 pg — ABNORMAL LOW (ref 26.0–34.0)
MCHC: 25.2 g/dL — ABNORMAL LOW (ref 30.0–36.0)
MCV: 76.3 fL — ABNORMAL LOW (ref 80.0–100.0)
Platelets: 434 10*3/uL — ABNORMAL HIGH (ref 150–400)
RBC: 4.1 MIL/uL — ABNORMAL LOW (ref 4.22–5.81)
RDW: 23.5 % — ABNORMAL HIGH (ref 11.5–15.5)
WBC: 19.1 10*3/uL — ABNORMAL HIGH (ref 4.0–10.5)
nRBC: 0.2 % (ref 0.0–0.2)

## 2022-06-16 LAB — LIPASE, BLOOD: Lipase: 30 U/L (ref 11–51)

## 2022-06-16 LAB — LACTIC ACID, PLASMA
Lactic Acid, Venous: 2.3 mmol/L (ref 0.5–1.9)
Lactic Acid, Venous: 1.1 mmol/L (ref 0.5–1.9)

## 2022-06-16 MED ORDER — SODIUM CHLORIDE 0.9 % IV SOLN: Freq: Once | INTRAVENOUS | Status: AC

## 2022-06-16 MED ORDER — SODIUM CHLORIDE 0.9 % IV BOLUS
500.0000 mL | Freq: Once | INTRAVENOUS | Status: AC
  Administered 2022-06-16: 500 mL via INTRAVENOUS

## 2022-06-16 MED ORDER — ONDANSETRON HCL 4 MG/2ML IJ SOLN
4.0000 mg | Freq: Once | INTRAMUSCULAR | Status: DC
  Filled 2022-06-16: qty 2

## 2022-06-16 MED ORDER — PANTOPRAZOLE SODIUM 40 MG IV SOLR
40.0000 mg | Freq: Once | INTRAVENOUS | Status: AC
  Administered 2022-06-16: 40 mg via INTRAVENOUS
  Filled 2022-06-16: qty 10

## 2022-06-16 MED ORDER — METOPROLOL TARTRATE 5 MG/5ML IV SOLN
5.0000 mg | Freq: Once | INTRAVENOUS | Status: AC
  Administered 2022-06-16: 5 mg via INTRAVENOUS
  Filled 2022-06-16: qty 5

## 2022-06-16 MED ORDER — SODIUM CHLORIDE 0.9 % IV BOLUS
1000.0000 mL | Freq: Once | INTRAVENOUS | Status: AC
  Administered 2022-06-16: 1000 mL via INTRAVENOUS

## 2022-06-16 MED ORDER — SODIUM CHLORIDE 0.9 % IV BOLUS: 1000.0000 mL | Freq: Once | INTRAVENOUS | Status: DC

## 2022-06-16 MED ORDER — SODIUM CHLORIDE 0.9 % IV SOLN
2.0000 g | Freq: Once | INTRAVENOUS | Status: AC
  Administered 2022-06-16: 2 g via INTRAVENOUS
  Filled 2022-06-16: qty 20

## 2022-06-16 NOTE — H&P (Incomplete)
History and Physical    Patient: Jonathan Wise [id number] DOB: Mar 15, 1946 DOA: 07/07/2022 DOS: the patient was seen and examined on 06/17/2022 PCP: Christain Sacramento, MD  Patient coming from: Home  Chief Complaint:  Chief Complaint  Patient presents with   Emesis   HPI: Jonathan Wise is a 77 y.o. male with medical history significant of type 2 diabetes mellitus, A-fib on Eliquis, hyperlipidemia, chronic respiratory failure on 3 LPM of oxygen at baseline who was on hospice and now presented to the ED due to nausea on waking up this morning, this was followed with vomitus x 1 which was described as coffee-ground in color.  Patient has not had any further vomiting episode again, ED complaining of 2-week onset of 1-2 loose bowel movement daily and denies fever, chills, abdominal pain, sick contacts.  ED Course:  In the emergency department, temperature was 97.71F, respiratory rate was 16/min, pulse 136 bpm, BP 99/70.  Workup in the ED showed WBC 19.1, hemoglobin 7.9, hematocrit 31.3, MCV 76.3, platelets 434.  CMP sodium 134, potassium 4.3, chloride 94, bicarb 27, glucose 153, BUN 45, creatinine 2.60 (baseline creatinine at 0.7-1.0), albumin 2.5.  Urinalysis was positive for large leukocytes and many bacteria with moderate hemoglobin on urine dipstick.  Lipase was normal at 30, lactic acid 1.1.  Blood culture pending, influenza A, B, SARS coronavirus 2, RSV was negative. Chest x-ray showed cardiomegaly.There are no signs of pulmonary edema or new focal infiltrates. There is interval improvement in the aeration in left lower lung fields suggesting decrease in pleural effusion and decrease in infiltrate. There is haziness in the lateral aspect of right lower lung field with interval improvement suggesting possible decrease in amount of right pleural effusion. Empiric IV ceftriaxone x 1 was given, Lopressor 5 mg IV x 1 was given, Protonix was given and IV hydration was provided.  Hospitalist was asked  to admit patient for further evaluation and management.   Review of Systems: Review of systems as noted in the HPI. All other systems reviewed and are negative.   Past Medical History:  Diagnosis Date   Atrial fibrillation ([place])    On Pradaxa   Bilateral lower extremity edema    Cellulitis    DVT (deep venous thrombosis) (HCC) 1994   LLE. Completed tx with coumadin   Hyperlipidemia    Hypertension    Kidney stones    Morbid obesity ([place])    Pressure ulcer    Sleep apnea    Type 2 diabetes mellitus ([name])    Urinary retention 10/15/2011   Venous stasis ulcers ([name])    Past Surgical History:  Procedure Laterality Date   CYSTOSCOPY  10/18/2011   Procedure: CYSTOSCOPY FLEXIBLE;  Surgeon: Marissa Nestle, MD;  Location: [facility];  Service: Urology;  Laterality: N/A;   HERNIA REPAIR     Umbilical    Social History:  reports that he quit smoking about 18 years ago. His smoking use included cigarettes. He has a 180.00 pack-year smoking history. He has never used smokeless tobacco. He reports that he does not drink alcohol and does not use drugs.   No Known Allergies  Family History  Problem Relation Age of Onset   Diabetes Mother    Emphysema Father      Prior to Admission medications   Medication Sig Start Date End Date Taking? Authorizing Provider  amiodarone (PACERONE) 200 MG tablet Take 2 tablets (400 mg total) by mouth 2 (two) times daily. 03/13/20 04/12/20  Johnson, Clanford L, MD  apixaban (ELIQUIS) 5 MG TABS tablet Take 1 tablet (5 mg total) by mouth 2 (two) times daily. 07/22/17   Kayleen Memos, DO  furosemide (LASIX) 40 MG tablet Take 1 tablet (40 mg total) by mouth 2 (two) times daily. 03/13/20   Johnson, Clanford L, MD  metoprolol tartrate (LOPRESSOR) 50 MG tablet Take 1 tablet (50 mg total) by mouth 2 (two) times daily. 03/13/20   Murlean Iba, MD  Multiple Vitamin (MULTIVITAMIN WITH MINERALS) TABS tablet Take 1 tablet by mouth daily. 03/14/20   Johnson, Clanford  L, MD  Nutritional Supplements (,FEEDING SUPPLEMENT, PROSOURCE PLUS) liquid Take 30 mLs by mouth 2 (two) times daily between meals. 03/13/20   Johnson, Clanford L, MD  potassium chloride SA (KLOR-CON) 20 MEQ tablet Take 2 tablets (40 mEq total) by mouth daily. 03/13/20   Johnson, Clanford L, MD  pravastatin (PRAVACHOL) 40 MG tablet Take 40 mg by mouth daily.    [provider]    Physical Exam: BP (!) 86/52   Pulse (!) 132   Temp (!) 97.5 F (36.4 C) (Oral)   Resp 18   Ht '6\' 1"'$  (1.854 m)   Wt 129.6 kg   SpO2 100%   BMI 37.70 kg/m   General: 77 y.o. year-old male well developed well nourished in no acute distress.  Alert and oriented x3. HEENT: NCAT, EOMI, dry mucous membrane. Neck: Supple, trachea medial Cardiovascular: Regular rate and rhythm with no rubs or gallops.  No thyromegaly or JVD noted.  No lower extremity edema. 2/4 pulses in all 4 extremities. Respiratory: Clear to auscultation with no wheezes or rales. Good inspiratory effort. Abdomen: Soft, nontender nondistended with normal bowel sounds x4 quadrants. Muskuloskeletal: Bilateral lower extremity wounds.   Neuro: CN II-XII intact, sensation, reflexes intact Skin: Ulcerative lesions bilaterally in lower extremities.  Psychiatry: Mood is appropriate for condition and setting           Labs on Admission:  Basic Metabolic Panel: Recent Labs  Lab 06/30/2022 1615  NA 134*  K 4.3  CL 94*  CO2 27  GLUCOSE 153*  BUN 45*  CREATININE 2.60*  CALCIUM 8.0*   Liver Function Tests: Recent Labs  Lab 06/19/2022 1615  AST 24  ALT 12  ALKPHOS 78  BILITOT 1.4*  PROT 6.7  ALBUMIN 2.5*   Recent Labs  Lab 06/27/2022 1615  LIPASE 30   No results for input(s): "AMMONIA" in the last 168 hours. CBC: Recent Labs  Lab 06/11/2022 1615  WBC 19.1*  HGB 7.9*  HCT 31.3*  MCV 76.3*  PLT 434*   Cardiac Enzymes: No results for input(s): "CKTOTAL", "CKMB", "CKMBINDEX", "TROPONINI" in the last 168 hours.  BNP (last 3  results) No results for input(s): "BNP" in the last 8760 hours.  ProBNP (last 3 results) No results for input(s): "PROBNP" in the last 8760 hours.  CBG: Recent Labs  Lab 06/17/22 0042  GLUCAP 130*    Radiological Exams on Admission: DG Chest Port 1 View  Result Date: 06/09/2022 CLINICAL DATA:  Generalized weakness, vomiting EXAM: PORTABLE CHEST 1 VIEW COMPARISON:  03/09/2020 FINDINGS: Transverse diameter of heart is increased. There are no signs of alveolar pulmonary edema. There is haziness in right lower lung field suggesting pleural effusion and possibly underlying infiltrate. There is possible bulla in the medial right lower lung fields. There is interval improvement in the aeration in left lower lung fields with residual increased density. There is no pneumothorax. IMPRESSION: Cardiomegaly.  There are no signs of pulmonary edema or new focal infiltrates. There is interval improvement in the aeration in left lower lung fields suggesting decrease in pleural effusion and decrease in infiltrate. There is haziness in the lateral aspect of right lower lung field with interval improvement suggesting possible decrease in amount of right pleural effusion. Electronically Signed   By: Elmer Picker M.D.   On: 06/24/2022 18:00    EKG: I independently viewed the EKG done and my findings are as followed: Sinus tachycardia at a rate of 128 bpm  Assessment/Plan Present on Admission:  Acute kidney injury ([place])  Venous stasis ulcers ([place])  Diarrhea  Hypotension  Principal Problem:   Acute kidney injury ([place]) Active Problems:   Hypotension   Venous stasis ulcers (HCC)   Diarrhea   Dehydration   Chronic respiratory failure with hypoxia (HCC)   GI bleed  Acute kidney injury Dehydration BUN 45, creatinine 2.60 (baseline creatinine at 0.7-1.0) IV hydration was provided in the ED Renally adjust medications, avoid nephrotoxic agents/dehydration/hypotension  Chronic respiratory failure on  supplemental oxygen at baseline Continue home supplemental oxygen  Possible GI bleed Patient complained of coffee-ground emesis x 1, hemoglobin at 7.9, this was 8.9 on 03/13/2020 Patient has not had any repeated emesis since arrival to the ED Guaiac test done in the ED was negative per EDP Witnessed fecal material was black; FOBT pending H/H will be trended Patient will be placed n.p.o. Continue IV Protonix 40 mg twice daily Gastroenterology will be consulted for further recommendation  Questionable diarrhea Patient states that he has been having 1-2 loose bowel movements since last 2 weeks He denies fever, chills, abdominal pain Last bowel movement was this morning and it was loose.  He has not had any other bowel movement since arrival to the ED. GI stool panel and C. difficile tests will be done if patient continues to have loose bowel movement  Hypotension IV metoprolol 5 mg x 1 was given in the ED and BP BP has been within hypotensive range IV hydration was given without much improvement Continue midodrine  Presumed UTI POA Patient was empirically treated with IV ceftriaxone, we shall continue same at this time with plan to de-escalate/discontinue based on urine culture Home Foley catheter will be changed  Hypoalbuminemia secondary to moderate protein calorie malnutrition Albumin 2.5, protein supplement to be provided.  Bilateral lower venous stasis ulcers/sacral decubitus ulcer Wound Nurse will be consulted Continue wound care  Type 2 diabetes mellitus CBG 153, there was no antidiabetic medication on patient's med rec Hemoglobin A1c will be checked  Mixed hyperlipidemia Continue statin  Chronic A-fib with RVR Metoprolol will be held at this time due to hypotension Eliquis will be held due to possible GI bleed  DVT prophylaxis: SCDs   Advance Care Planning: Code Status: Full code  Consults: Gastroenterology  Family Communication: Wife and granddaughter at  bedside  Severity of Illness: The appropriate patient status for this patient is INPATIENT. Inpatient status is judged to be reasonable and necessary in order to provide the required intensity of service to ensure the patient's safety. The patient's presenting symptoms, physical exam findings, and initial radiographic and laboratory data in the context of their chronic comorbidities is felt to place them at high risk for further clinical deterioration. Furthermore, it is not anticipated that the patient will be medically stable for discharge from the hospital within 2 midnights of admission.   * I certify that at the point of admission it is my  clinical judgment that the patient will require inpatient hospital care spanning beyond 2 midnights from the point of admission due to high intensity of service, high risk for further deterioration and high frequency of surveillance required.*  Author: Bernadette Hoit, DO 06/17/2022 2:46 AM  For on call review [email or web address].

## 2022-06-16 NOTE — ED Triage Notes (Signed)
Pt BIB [facility] from home. Pt C/o vomiting this am X1 around 8am. Described it as cofee grounds. Pt also had BM this am and said it was very dark. He spoke with hospice nurse who was concerned about internal bleeding and recommended him come to the ED. VS per EMS: Temp: 97.9, P 137, R: 18/, BP: 112/58. History of CHF and DM2

## 2022-06-16 NOTE — ED Provider Notes (Signed)
[facility] Provider Note   CSN: [id number] Arrival date & time: 07/01/2022  1550     History {Add pertinent medical, surgical, social history, OB history to HPI:1} Chief Complaint  Patient presents with   Emesis    Jonathan Wise is a 77 y.o. male.  He has a history of A-fib on Eliquis, diabetes, normally on oxygen.  Said he was nauseous when he woke up this morning and vomited once that had black in it.  Also thinks he had a dark bowel movement.  Denies any abdominal pain.  States feels maybe a little more short of breath than baseline.  No chest pain or fevers.  The history is provided by the patient.  Emesis Quality:  Coffee grounds Progression:  Unchanged Chronicity:  New Associated symptoms: no abdominal pain, no diarrhea and no fever        Home Medications Prior to Admission medications   Medication Sig Start Date End Date Taking? Authorizing Provider  amiodarone (PACERONE) 200 MG tablet Take 2 tablets (400 mg total) by mouth 2 (two) times daily. 03/13/20 04/12/20  Johnson, Clanford L, MD  apixaban (ELIQUIS) 5 MG TABS tablet Take 1 tablet (5 mg total) by mouth 2 (two) times daily. 07/22/17   Kayleen Memos, DO  furosemide (LASIX) 40 MG tablet Take 1 tablet (40 mg total) by mouth 2 (two) times daily. 03/13/20   Johnson, Clanford L, MD  metoprolol tartrate (LOPRESSOR) 50 MG tablet Take 1 tablet (50 mg total) by mouth 2 (two) times daily. 03/13/20   Murlean Iba, MD  Multiple Vitamin (MULTIVITAMIN WITH MINERALS) TABS tablet Take 1 tablet by mouth daily. 03/14/20   Johnson, Clanford L, MD  Nutritional Supplements (,FEEDING SUPPLEMENT, PROSOURCE PLUS) liquid Take 30 mLs by mouth 2 (two) times daily between meals. 03/13/20   Johnson, Clanford L, MD  potassium chloride SA (KLOR-CON) 20 MEQ tablet Take 2 tablets (40 mEq total) by mouth daily. 03/13/20   Johnson, Clanford L, MD  pravastatin (PRAVACHOL) 40 MG tablet Take 40 mg by mouth  daily.    [provider]      Allergies    Patient has no known allergies.    Review of Systems   Review of Systems  Constitutional:  Negative for fever.  Respiratory:  Positive for shortness of breath.   Cardiovascular:  Negative for chest pain.  Gastrointestinal:  Positive for nausea and vomiting. Negative for abdominal pain and diarrhea.    Physical Exam Updated Vital Signs BP 125/78   Pulse (!) 136   Resp 17   Ht '6\' 1"'$  (1.854 m)   Wt 121.1 kg   SpO2 100%   BMI 35.22 kg/m  Physical Exam Vitals and nursing note reviewed.  Constitutional:      General: He is not in acute distress.    Appearance: Normal appearance. He is well-developed. He is obese.  HENT:     Head: Normocephalic and atraumatic.  Eyes:     Conjunctiva/sclera: Conjunctivae normal.  Cardiovascular:     Rate and Rhythm: Tachycardia present. Rhythm irregular.     Heart sounds: No murmur heard. Pulmonary:     Effort: Pulmonary effort is normal. No respiratory distress.     Breath sounds: Normal breath sounds.  Abdominal:     Palpations: Abdomen is soft.     Tenderness: There is no abdominal tenderness. There is no guarding or rebound.  Genitourinary:    Comments: Rectal exam done with  nurse chaperone.  Normal tone no masses.  Guaiac negative. Musculoskeletal:        General: No swelling.     Cervical back: Neck supple.  Skin:    General: Skin is warm and dry.     Capillary Refill: Capillary refill takes less than 2 seconds.  Neurological:     Mental Status: He is alert. Mental status is at baseline.     ED Results / Procedures / Treatments   Labs (all labs ordered are listed, but only abnormal results are displayed) Labs Reviewed  COMPREHENSIVE METABOLIC PANEL  CBC  LIPASE, BLOOD  URINALYSIS, ROUTINE W REFLEX MICROSCOPIC  LACTIC ACID, PLASMA  LACTIC ACID, PLASMA  POC OCCULT BLOOD, ED  TYPE AND SCREEN    EKG None  Radiology No results found.  Procedures Procedures   {Document cardiac monitor, telemetry assessment procedure when appropriate:1}  Medications Ordered in ED Medications  pantoprazole (PROTONIX) injection 40 mg (has no administration in time range)    ED Course/ Medical Decision Making/ A&P   {   Click here for ABCD2, HEART and other calculatorsREFRESH Note before signing :1}                          Medical Decision Making Amount and/or Complexity of Data Reviewed Labs: ordered.  Risk Prescription drug management.   This patient complains of ***; this involves an extensive number of treatment Options and is a complaint that carries with it a high risk of complications and morbidity. The differential includes ***  I ordered, reviewed and interpreted labs, which included *** I ordered medication *** and reviewed PMP when indicated. I ordered imaging studies which included *** and I independently    visualized and interpreted imaging which showed *** Additional history obtained from *** Previous records obtained and reviewed *** I consulted *** and discussed lab and imaging findings and discussed disposition.  Cardiac monitoring reviewed, *** Social determinants considered, *** Critical Interventions: ***  After the interventions stated above, I reevaluated the patient and found *** Admission and further testing considered, ***   {Document critical care time when appropriate:1} {Document review of labs and clinical decision tools ie heart score, Chads2Vasc2 etc:1}  {Document your independent review of radiology images, and any outside records:1} {Document your discussion with family members, caretakers, and with consultants:1} {Document social determinants of health affecting pt's care:1} {Document your decision making why or why not admission, treatments were needed:1} Final Clinical Impression(s) / ED Diagnoses Final diagnoses:  None    Rx / DC Orders ED Discharge Orders     None

## 2022-06-17 DIAGNOSIS — E860 Dehydration: Secondary | ICD-10-CM | POA: Insufficient documentation

## 2022-06-17 DIAGNOSIS — N179 Acute kidney failure, unspecified: Secondary | ICD-10-CM | POA: Diagnosis not present

## 2022-06-17 DIAGNOSIS — J9611 Chronic respiratory failure with hypoxia: Secondary | ICD-10-CM | POA: Insufficient documentation

## 2022-06-17 DIAGNOSIS — K922 Gastrointestinal hemorrhage, unspecified: Secondary | ICD-10-CM | POA: Insufficient documentation

## 2022-06-17 DIAGNOSIS — L89159 Pressure ulcer of sacral region, unspecified stage: Secondary | ICD-10-CM | POA: Insufficient documentation

## 2022-06-17 LAB — COMPREHENSIVE METABOLIC PANEL
ALT: 11 U/L (ref 0–44)
AST: 20 U/L (ref 15–41)
Albumin: 2.6 g/dL — ABNORMAL LOW (ref 3.5–5.0)
Alkaline Phosphatase: 75 U/L (ref 38–126)
Anion gap: 14 (ref 5–15)
BUN: 44 mg/dL — ABNORMAL HIGH (ref 8–23)
CO2: 24 mmol/L (ref 22–32)
Calcium: 7.9 mg/dL — ABNORMAL LOW (ref 8.9–10.3)
Chloride: 99 mmol/L (ref 98–111)
Creatinine, Ser: 2.52 mg/dL — ABNORMAL HIGH (ref 0.61–1.24)
GFR, Estimated: 26 mL/min — ABNORMAL LOW (ref >60)
Glucose, Bld: 134 mg/dL — ABNORMAL HIGH (ref 70–99)
Potassium: 4.3 mmol/L (ref 3.5–5.1)
Sodium: 137 mmol/L (ref 135–145)
Total Bilirubin: 1.1 mg/dL (ref 0.3–1.2)
Total Protein: 6.6 g/dL (ref 6.5–8.1)

## 2022-06-17 LAB — CBC
HCT: 31.7 % — ABNORMAL LOW (ref 39.0–52.0)
Hemoglobin: 7.8 g/dL — ABNORMAL LOW (ref 13.0–17.0)
MCH: 19.2 pg — ABNORMAL LOW (ref 26.0–34.0)
MCHC: 24.6 g/dL — ABNORMAL LOW (ref 30.0–36.0)
MCV: 77.9 fL — ABNORMAL LOW (ref 80.0–100.0)
Platelets: 390 10*3/uL (ref 150–400)
RBC: 4.07 MIL/uL — ABNORMAL LOW (ref 4.22–5.81)
RDW: 23.6 % — ABNORMAL HIGH (ref 11.5–15.5)
WBC: 21.2 10*3/uL — ABNORMAL HIGH (ref 4.0–10.5)
nRBC: 0.3 % — ABNORMAL HIGH (ref 0.0–0.2)

## 2022-06-17 LAB — GLUCOSE, CAPILLARY: Glucose-Capillary: 130 mg/dL — ABNORMAL HIGH (ref 70–99)

## 2022-06-17 LAB — MRSA NEXT GEN BY PCR, NASAL: MRSA by PCR Next Gen: DETECTED — AB

## 2022-06-17 LAB — PHOSPHORUS: Phosphorus: 6.4 mg/dL — ABNORMAL HIGH (ref 2.5–4.6)

## 2022-06-17 LAB — MAGNESIUM: Magnesium: 2.4 mg/dL (ref 1.7–2.4)

## 2022-06-17 MED ORDER — GLYCOPYRROLATE 0.2 MG/ML IJ SOLN: 0.2000 mg | INTRAMUSCULAR | Status: DC | PRN

## 2022-06-17 MED ORDER — ACETAMINOPHEN 650 MG RE SUPP: 650.0000 mg | Freq: Four times a day (QID) | RECTAL | Status: DC | PRN

## 2022-06-17 MED ORDER — GLYCOPYRROLATE 1 MG PO TABS: 1.0000 mg | ORAL_TABLET | ORAL | Status: DC | PRN

## 2022-06-17 MED ORDER — PRAVASTATIN SODIUM 40 MG PO TABS: 40.0000 mg | ORAL_TABLET | Freq: Every day | ORAL | Status: DC

## 2022-06-17 MED ORDER — SODIUM CHLORIDE 0.9 % IV SOLN: INTRAVENOUS | Status: DC

## 2022-06-17 MED ORDER — ENSURE ENLIVE PO LIQD
237.0000 mL | Freq: Two times a day (BID) | ORAL | Status: DC
  Administered 2022-06-18 – 2022-06-20 (×3): 237 mL via ORAL

## 2022-06-17 MED ORDER — ONDANSETRON HCL 4 MG/2ML IJ SOLN
4.0000 mg | Freq: Four times a day (QID) | INTRAMUSCULAR | Status: DC | PRN
  Administered 2022-06-17 – 2022-06-20 (×3): 4 mg via INTRAVENOUS
  Filled 2022-06-17 (×3): qty 2

## 2022-06-17 MED ORDER — LORAZEPAM 1 MG PO TABS
1.0000 mg | ORAL_TABLET | ORAL | Status: DC | PRN
  Administered 2022-06-21: 1 mg via ORAL
  Filled 2022-06-17: qty 1

## 2022-06-17 MED ORDER — ACETAMINOPHEN 325 MG PO TABS: 650.0000 mg | ORAL_TABLET | Freq: Four times a day (QID) | ORAL | Status: DC | PRN

## 2022-06-17 MED ORDER — MORPHINE SULFATE (PF) 2 MG/ML IV SOLN
1.0000 mg | INTRAVENOUS | Status: DC | PRN
  Administered 2022-06-17 – 2022-06-21 (×9): 1 mg via INTRAVENOUS
  Filled 2022-06-17 (×9): qty 1

## 2022-06-17 MED ORDER — PANTOPRAZOLE SODIUM 40 MG IV SOLR
40.0000 mg | Freq: Two times a day (BID) | INTRAVENOUS | Status: DC
  Administered 2022-06-17: 40 mg via INTRAVENOUS
  Filled 2022-06-17: qty 10

## 2022-06-17 MED ORDER — ENOXAPARIN SODIUM 40 MG/0.4ML IJ SOSY: 40.0000 mg | PREFILLED_SYRINGE | INTRAMUSCULAR | Status: DC

## 2022-06-17 MED ORDER — LORAZEPAM 1 MG PO TABS
1.0000 mg | ORAL_TABLET | ORAL | Status: DC | PRN
  Administered 2022-06-20 – 2022-06-21 (×2): 1 mg via SUBLINGUAL
  Filled 2022-06-17 (×2): qty 1

## 2022-06-17 MED ORDER — ONDANSETRON HCL 4 MG PO TABS: 4.0000 mg | ORAL_TABLET | Freq: Four times a day (QID) | ORAL | Status: DC | PRN

## 2022-06-17 MED ORDER — MIDODRINE HCL 5 MG PO TABS
10.0000 mg | ORAL_TABLET | Freq: Three times a day (TID) | ORAL | Status: DC
  Administered 2022-06-17: 5 mg via ORAL
  Filled 2022-06-17: qty 2

## 2022-06-17 MED ORDER — SPIRITUS FRUMENTI: 1.0000 | Freq: Three times a day (TID) | ORAL | Status: DC | PRN

## 2022-06-17 MED ORDER — LORAZEPAM 2 MG/ML IJ SOLN
1.0000 mg | INTRAMUSCULAR | Status: DC | PRN
  Administered 2022-06-18 – 2022-06-21 (×4): 1 mg via INTRAVENOUS
  Filled 2022-06-17 (×4): qty 1

## 2022-06-17 MED ORDER — SODIUM CHLORIDE 0.9 % IV SOLN: 1.0000 g | INTRAVENOUS | Status: DC

## 2022-06-17 MED ORDER — HYDROCODONE-ACETAMINOPHEN 5-325 MG PO TABS
1.0000 | ORAL_TABLET | ORAL | Status: DC | PRN
  Administered 2022-06-17 – 2022-06-21 (×5): 1 via ORAL
  Filled 2022-06-17 (×6): qty 1

## 2022-06-17 MED ORDER — CHLORHEXIDINE GLUCONATE CLOTH 2 % EX PADS
6.0000 | MEDICATED_PAD | Freq: Every day | CUTANEOUS | Status: DC
  Administered 2022-06-17 – 2022-06-20 (×4): 6 via TOPICAL

## 2022-06-17 NOTE — Progress Notes (Signed)
PROGRESS NOTE    Jonathan Wise  [id number] DOB: 1946/03/06 DOA: 06/29/2022 PCP: Christain Sacramento, MD   Brief Narrative:   Jonathan Wise is a 77 y.o. male with medical history significant of type 2 diabetes mellitus, A-fib on Eliquis, hyperlipidemia, chronic respiratory failure on 3 LPM of oxygen at baseline who was on hospice and now presented to the ED due to nausea on waking up this morning, this was followed with vomitus x 1 which was described as coffee-ground in color.  Patient was admitted with possible GI bleed as well as AKI and presumed UTI.  Patient is now comfort care.  Assessment & Plan:   Principal Problem:   Acute kidney injury ([place]) Active Problems:   Hypotension   Venous stasis ulcers (HCC)   Diarrhea   Dehydration   Chronic respiratory failure with hypoxia (HCC)   GI bleed   Sacral decubitus ulcer  Assessment and Plan:  Worsening hypotension in the setting of possible GI bleed with AKI as well as UTI.  Patient now transition to comfort care with anticipated in-hospital death expected.  He is not stable for transfer.   DVT prophylaxis:SCDs Code Status: DNR/comfort care Family Communication: Discussed with granddaughter on phone 3/9 Disposition Plan: Anticipated in-hospital death Status is: Inpatient Remains inpatient appropriate because: Need for IV fluids and medications.  Skin Assessment:  I have examined the patient's skin and I agree with the wound assessment as performed by the wound care RN as outlined below:  Pressure Injury 05/14/19 Ischial tuberosity Right Stage 2 -  Partial thickness loss of dermis presenting as a shallow open injury with a red, pink wound bed without slough. WOC updated FS to list left and right ischial wounds separately: copied and paste (Active)  05/14/19 [id number]  Location: Ischial tuberosity  Location Orientation: Right  Staging: Stage 2 -  Partial thickness loss of dermis presenting as a shallow open injury with a red,  pink wound bed without slough.  Wound Description (Comments): WOC updated FS to list left and right ischial wounds separately: copied and pasted this note from admitting RN  right black wound about 1 cm round on right buttock cheek. Entire buttock area red non blanchable   Present on Admission: Yes     Pressure Injury 06/18/19 Sacrum Stage 1 -  Intact skin with non-blanchable redness of a localized area usually over a bony prominence. excoriated redness noted to sacrum that extends to perineum and testicles.  (Active)  06/18/19 1309 (history of same from previous visit. present at time of arrival to ED per AM report.)  Location: Sacrum  Location Orientation:   Staging: Stage 1 -  Intact skin with non-blanchable redness of a localized area usually over a bony prominence.  Wound Description (Comments): excoriated redness noted to sacrum that extends to perineum and testicles.   Present on Admission: Yes (present at time of arrival to ED per AM report.)     Pressure Injury 06/17/22 Buttocks Left Stage 2 -  Partial thickness loss of dermis presenting as a shallow open injury with a red, pink wound bed without slough. (Active)  06/17/22 0056  Location: Buttocks  Location Orientation: Left  Staging: Stage 2 -  Partial thickness loss of dermis presenting as a shallow open injury with a red, pink wound bed without slough.  Wound Description (Comments):   Present on Admission: Yes  Dressing Type Foam - Lift dressing to assess site every shift;Impregnated gauze (bismuth) 06/17/22 0119  Pressure Injury 06/17/22 Buttocks Left Stage 2 -  Partial thickness loss of dermis presenting as a shallow open injury with a red, pink wound bed without slough. lowe inner buttocks (Active)  06/17/22 0056  Location: Buttocks  Location Orientation: Left  Staging: Stage 2 -  Partial thickness loss of dermis presenting as a shallow open injury with a red, pink wound bed without slough.  Wound Description (Comments):  lowe inner buttocks  Present on Admission: Yes  Dressing Type Foam - Lift dressing to assess site every shift;Impregnated gauze (bismuth) 06/17/22 0124     Pressure Injury 06/17/22 Buttocks Right Deep Tissue Pressure Injury - Purple or maroon localized area of discolored intact skin or blood-filled blister due to damage of underlying soft tissue from pressure and/or shear. (Active)  06/17/22 0056  Location: Buttocks  Location Orientation: Right  Staging: Deep Tissue Pressure Injury - Purple or maroon localized area of discolored intact skin or blood-filled blister due to damage of underlying soft tissue from pressure and/or shear.  Wound Description (Comments):   Present on Admission: Yes  Dressing Type Foam - Lift dressing to assess site every shift 06/17/22 0121    Consultants:  Palliative care  Procedures:  None  Antimicrobials:  Anti-infectives (From admission, onward)    Start     Dose/Rate Route Frequency Ordered Stop   06/17/22 1000  cefTRIAXone (ROCEPHIN) 1 g in sodium chloride 0.9 % 100 mL IVPB        1 g 200 mL/hr over 30 Minutes Intravenous Every 24 hours 06/17/22 0412 06/23/22 0959   06/19/2022 2030  cefTRIAXone (ROCEPHIN) 2 g in sodium chloride 0.9 % 100 mL IVPB        2 g 200 mL/hr over 30 Minutes Intravenous  Once 06/09/2022 2019 07/07/2022 2151      Subjective: Patient seen and evaluated today with no new acute complaints or concerns. No acute concerns or events noted overnight.  Objective: Vitals:   06/17/22 0400 06/17/22 0431 06/17/22 0500 06/17/22 0600  BP:   (!) 81/48 (!) 87/45  Pulse: (!) 136 (!) 134 (!) 131 (!) 134  Resp: (!) 21 17 (!) 24 (!) 29  Temp:  (!) 97.5 F (36.4 C)    TempSrc:  Oral    SpO2: 100% 100% 100% 100%  Weight:      Height:        Intake/Output Summary (Last 24 hours) at 06/17/2022 0713 Last data filed at 06/11/2022 2151 Gross per 24 hour  Intake 1600 ml  Output --  Net 1600 ml   Filed Weights   06/18/2022 1606 06/17/22 0056   Weight: 121.1 kg 129.6 kg    Examination:  General exam: Appears calm and comfortable  Respiratory system: Clear to auscultation. Respiratory effort normal. Cardiovascular system: S1 & S2 heard, RRR.  Gastrointestinal system: Abdomen is soft Central nervous system: Alert and awake Extremities: No edema Skin: No significant lesions noted Psychiatry: Flat affect.    Data Reviewed: I have personally reviewed following labs and imaging studies  CBC: Recent Labs  Lab 06/09/2022 1615 06/17/22 0409  WBC 19.1* 21.2*  HGB 7.9* 7.8*  HCT 31.3* 31.7*  MCV 76.3* 77.9*  PLT 434* [id number]   Basic Metabolic Panel: Recent Labs  Lab 06/30/2022 1615 06/17/22 0409  NA 134* 137  K 4.3 4.3  CL 94* 99  CO2 27 24  GLUCOSE 153* 134*  BUN 45* 44*  CREATININE 2.60* 2.52*  CALCIUM 8.0* 7.9*  MG  --  2.4  PHOS  --  6.4*   GFR: Estimated Creatinine Clearance: 34.7 mL/min (A) (by C-G formula based on SCr of 2.52 mg/dL (H)). Liver Function Tests: Recent Labs  Lab 07/08/2022 1615 06/17/22 0409  AST 24 20  ALT 12 11  ALKPHOS 78 75  BILITOT 1.4* 1.1  PROT 6.7 6.6  ALBUMIN 2.5* 2.6*   Recent Labs  Lab 06/10/2022 1615  LIPASE 30   No results for input(s): "AMMONIA" in the last 168 hours. Coagulation Profile: No results for input(s): "INR", "PROTIME" in the last 168 hours. Cardiac Enzymes: No results for input(s): "CKTOTAL", "CKMB", "CKMBINDEX", "TROPONINI" in the last 168 hours. BNP (last 3 results) No results for input(s): "PROBNP" in the last 8760 hours. HbA1C: No results for input(s): "HGBA1C" in the last 72 hours. CBG: Recent Labs  Lab 06/17/22 0042  GLUCAP 130*   Lipid Profile: No results for input(s): "CHOL", "HDL", "LDLCALC", "TRIG", "CHOLHDL", "LDLDIRECT" in the last 72 hours. Thyroid Function Tests: No results for input(s): "TSH", "T4TOTAL", "FREET4", "T3FREE", "THYROIDAB" in the last 72 hours. Anemia Panel: No results for input(s): "VITAMINB12", "FOLATE", "FERRITIN",  "TIBC", "IRON", "RETICCTPCT" in the last 72 hours. Sepsis Labs: Recent Labs  Lab 06/10/2022 1616 06/14/2022 1854  LATICACIDVEN 2.3* 1.1    Recent Results (from the past 240 hour(s))  Culture, blood (routine x 2)     Status: None (Preliminary result)   Collection Time: 06/26/2022  5:25 PM   Specimen: BLOOD RIGHT WRIST  Result Value Ref Range Status   Specimen Description   Final    BLOOD RIGHT WRIST BOTTLES DRAWN AEROBIC AND ANAEROBIC   Special Requests Blood Culture adequate volume  Final   Culture   Final    NO GROWTH < 12 HOURS Performed at [facility], [address], [place], [place] [address]    Report Status PENDING  Incomplete  Culture, blood (routine x 2)     Status: None (Preliminary result)   Collection Time: 06/29/2022  5:25 PM   Specimen: Left Antecubital; Blood  Result Value Ref Range Status   Specimen Description   Final    LEFT ANTECUBITAL BOTTLES DRAWN AEROBIC AND ANAEROBIC   Special Requests Blood Culture adequate volume  Final   Culture   Final    NO GROWTH < 12 HOURS Performed at [facility], [address], [place], [place] [address]    Report Status PENDING  Incomplete  Resp panel by RT-PCR (RSV, Flu A&B, Covid) Anterior Nasal Swab     Status: None   Collection Time: 07/03/2022  6:01 PM   Specimen: Anterior Nasal Swab  Result Value Ref Range Status   SARS Coronavirus 2 by RT PCR NEGATIVE NEGATIVE Final    Comment: (NOTE) SARS-CoV-2 target nucleic acids are NOT DETECTED.  The SARS-CoV-2 RNA is generally detectable in upper respiratory specimens during the acute phase of infection. The lowest concentration of SARS-CoV-2 viral copies this assay can detect is 138 copies/mL. A negative result does not preclude SARS-Cov-2 infection and should not be used as the sole basis for treatment or other patient management decisions. A negative result may occur with  improper specimen collection/handling, submission of specimen other than nasopharyngeal swab, presence of  viral mutation(s) within the areas targeted by this assay, and inadequate number of viral copies(<138 copies/mL). A negative result must be combined with clinical observations, patient history, and epidemiological information. The expected result is Negative.  Fact Sheet for Patients:  EntrepreneurPulse.com.au  Fact Sheet for Healthcare Providers:  IncredibleEmployment.be  This  test is no t yet approved or cleared by the [place] and  has been authorized for detection and/or diagnosis of SARS-CoV-2 by FDA under an Emergency Use Authorization (EUA). This EUA will remain  in effect (meaning this test can be used) for the duration of the COVID-19 declaration under Section 564(b)(1) of the Act, 21 U.S.C.section 360bbb-3(b)(1), unless the authorization is terminated  or revoked sooner.       Influenza A by PCR NEGATIVE NEGATIVE Final   Influenza B by PCR NEGATIVE NEGATIVE Final    Comment: (NOTE) The Xpert Xpress SARS-CoV-2/FLU/RSV plus assay is intended as an aid in the diagnosis of influenza from Nasopharyngeal swab specimens and should not be used as a sole basis for treatment. Nasal washings and aspirates are unacceptable for Xpert Xpress SARS-CoV-2/FLU/RSV testing.  Fact Sheet for Patients: EntrepreneurPulse.com.au  Fact Sheet for Healthcare Providers: IncredibleEmployment.be  This test is not yet approved or cleared by the [place] FDA and has been authorized for detection and/or diagnosis of SARS-CoV-2 by FDA under an Emergency Use Authorization (EUA). This EUA will remain in effect (meaning this test can be used) for the duration of the COVID-19 declaration under Section 564(b)(1) of the Act, 21 U.S.C. section 360bbb-3(b)(1), unless the authorization is terminated or revoked.     Resp Syncytial Virus by PCR NEGATIVE NEGATIVE Final    Comment: (NOTE) Fact Sheet for  Patients: EntrepreneurPulse.com.au  Fact Sheet for Healthcare Providers: IncredibleEmployment.be  This test is not yet approved or cleared by the [place] FDA and has been authorized for detection and/or diagnosis of SARS-CoV-2 by FDA under an Emergency Use Authorization (EUA). This EUA will remain in effect (meaning this test can be used) for the duration of the COVID-19 declaration under Section 564(b)(1) of the Act, 21 U.S.C. section 360bbb-3(b)(1), unless the authorization is terminated or revoked.  Performed at [facility], [address], [place], [place] [address]          Radiology Studies: [facility] 1 View  Result Date: 06/14/2022 CLINICAL DATA:  Generalized weakness, vomiting EXAM: PORTABLE CHEST 1 VIEW COMPARISON:  03/09/2020 FINDINGS: Transverse diameter of heart is increased. There are no signs of alveolar pulmonary edema. There is haziness in right lower lung field suggesting pleural effusion and possibly underlying infiltrate. There is possible bulla in the medial right lower lung fields. There is interval improvement in the aeration in left lower lung fields with residual increased density. There is no pneumothorax. IMPRESSION: Cardiomegaly. There are no signs of pulmonary edema or new focal infiltrates. There is interval improvement in the aeration in left lower lung fields suggesting decrease in pleural effusion and decrease in infiltrate. There is haziness in the lateral aspect of right lower lung field with interval improvement suggesting possible decrease in amount of right pleural effusion. Electronically Signed   By: Elmer Picker M.D.   On: 06/27/2022 18:00        Scheduled Meds:  Chlorhexidine Gluconate Cloth  6 each Topical Q0600   feeding supplement  237 mL Oral BID BM   midodrine  10 mg Oral TID WC   ondansetron (ZOFRAN) IV  4 mg Intravenous Once   pantoprazole (PROTONIX) IV  40 mg Intravenous Q12H    pravastatin  40 mg Oral Daily   Continuous Infusions:  cefTRIAXone (ROCEPHIN)  IV       LOS: 1 day    Time spent: 35 minutes    Numa Heatwole Darleen Crocker, DO [facility]  If 7PM-7AM,  please contact night-coverage [email or web address] 06/17/2022, 7:13 AM

## 2022-06-17 NOTE — Progress Notes (Signed)
Pt arrived to room 11 on tele via stretcher, pt A/Ox4, multiple wounds dressings changed and measurements taken. BP low, notified Dr. Josephine Cables to place admit orders. Family at bedside.

## 2022-06-17 NOTE — Consult Note (Signed)
WOC Nurse Consult Note: Reason for Consult:Bilateral venous stasis ulcers and stage 2 PI to sacrum (POA) Wound type: venous insufficiency, pressure Pressure Injury POA: Yes Measurement:Stage 2 PI, 1cm round x 0.1cm pink, moist LEs: See photodocumentation in EMR Pink, moist Wound bed:As described above Drainage (amount, consistency, odor) moderate serous from LEs, scant serous from sacrum Periwound:Venous insufficiency related skin changes to bilateral LEs Dressing procedure/placement/frequency:I have provided Nursing with guidance for care of the skin lesions using xeroform and a sacral dressing to the stage 2 sacral wound and silver hydrofiber topped with ABD pads and secured with Kerlix to the LEs. Bilateral heel boots are provided for offloading of heels.  [facility] nursing team will not follow, but will remain available to this patient, the nursing and medical teams.  Please re-consult if needed.  Thank you for inviting [place] to participate in this patient's Plan of Care.  Maudie Flakes, MSN, RN, CNS, [place], Serita Grammes, [employer], [employer] phone:  [phone]

## 2022-06-17 NOTE — TOC Initial Note (Signed)
Transition of Care [facility]) - Initial/Assessment Note    Patient Details  Name: Jonathan Wise MRN: [id number] Date of Birth: [birth year]  Transition of [facility]) CM/SW Contact:    Joaquin Courts, RN Phone Number: 06/17/2022, 9:45 AM  Clinical Narrative:                 CM has reviewed chart, noted comfort measures.  MD reports anticipate hospital death, patient to low to move.  Expected Discharge Plan:  (expected hospital death) Barriers to Discharge: Continued Medical Work up   Patient Goals and CMS Choice Patient states their goals for this hospitalization and ongoing recovery are:: comfort measures          Expected Discharge Plan and Services       Living arrangements for the past 2 months: Single Family Home                                      Prior Living Arrangements/Services Living arrangements for the past 2 months: Single Family Home   Patient language and need for interpreter reviewed:: Yes        Need for Family Participation in Patient Care: Yes (Comment) Care giver support system in place?: Yes (comment)   Criminal Activity/Legal Involvement Pertinent to Current Situation/Hospitalization: No - Comment as needed  Activities of Daily Living      Permission Sought/Granted                  Emotional Assessment       Orientation: : Fluctuating Orientation (Suspected and/or reported Sundowners)   Psych Involvement: No (comment)  Admission diagnosis:  Acute kidney injury ([place]) [N17.9] Patient Active Problem List   Diagnosis Date Noted   Dehydration 06/17/2022   Chronic respiratory failure with hypoxia ([place]) 06/17/2022   GI bleed 06/17/2022   Sacral decubitus ulcer 06/17/2022   Acute kidney injury ([place]) 06/27/2022   DNR (do not resuscitate) 03/10/2020   Acute respiratory distress    Bilateral pleural effusion 03/08/2020   Protein calorie malnutrition ([place]) 03/08/2020   Acute respiratory failure with hypercapnia ([place])  03/05/2020   SIRS (systemic inflammatory response syndrome) ([place]) 03/05/2020   Elevated brain natriuretic peptide (BNP) level 03/05/2020   Acute on chronic respiratory failure with hypoxia and hypercapnia ([place]) 03/05/2020   UTI (urinary tract infection) 12/04/2019   Acute on chronic diastolic CHF (congestive heart failure) ([place]) 12/04/2019   Elevated troponin 12/04/2019   Goals of care, counseling/discussion    Palliative care by specialist    Severe sepsis with septic shock ([place]) 10/24/2019   Sepsis secondary to UTI ([place]) 10/24/2019   Lobar pneumonia ([place]) 10/24/2019   DVT (deep venous thrombosis) ([place]) 10/24/2019   Severe sepsis ([place]) 10/19/2019   Sepsis ([place]) 10/19/2019   Pressure injury of skin 06/19/2019   Chronic painful diabetic neuropathy ([place]) 05/14/2019   Bedbound 05/14/2019   Former heavy tobacco smoker 05/14/2019   Atrial fibrillation with RVR ([place]) 05/13/2019   Diarrhea 01/01/2017   Chronic osteoarthritis 01/01/2017   Lower extremity weakness 01/01/2017   Acute respiratory failure with hypoxia ([place]) 12/28/2016   Anemia of chronic disease 12/25/2015   Candidal intertrigo 12/22/2015   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   Bradycardia 03/15/2014   Lactic acidosis 03/15/2014   Atrial fibrillation ([place]) 06/23/2012   PVC's (premature ventricular contractions) 06/23/2012   Cellulitis of leg, left 06/22/2012   Sepsis due to  pneumonia ([place]) 06/22/2012   Hyperglycemia due to diabetes mellitus ([place]) 06/22/2012   Morbid obesity ([place]) 06/22/2012   Osteoarthritis of right knee 06/22/2012   Current use of long term anticoagulation 06/22/2012   Pulmonary artery hypertension ([place]) 10/18/2011   Bilateral lower extremity edema 10/14/2011   Hypotension 10/14/2011   Venous stasis ulcers ([place]) 10/14/2011   Type 2 diabetes mellitus without complication ([place]) [id number]   PCP:  Christain Sacramento, MD Pharmacy:   [id number] [place]Phone: [phone]: [phone]    Social Determinants of Health (SDOH) Social History: SDOH Screenings   Tobacco Use: Medium Risk (06/28/2022)   SDOH Interventions:     Readmission Risk Interventions    06/17/2022    9:42 AM 03/10/2020    2:14 PM 12/07/2019    1:38 PM  Readmission Risk Prevention Plan  Transportation Screening Complete Complete   PCP or Specialist Appt within 5-7 Days Not Complete    Not Complete comments comfort care, anticipate hospital death    PCP or Specialist Appt within 3-5 Days   Complete  Home Care Screening Complete    Medication Review (RN CM) Complete    Medication Review (RN Care Manager)  Complete   HRI or [place] Complete   SW Recovery Care/Counseling Consult  Complete   Palliative Care Screening  Complete   [place] Not Applicable

## 2022-06-18 DIAGNOSIS — N179 Acute kidney failure, unspecified: Secondary | ICD-10-CM | POA: Diagnosis not present

## 2022-06-18 LAB — URINE CULTURE

## 2022-06-18 NOTE — Plan of Care (Signed)
  Problem: Pain Managment: Goal: General experience of comfort will improve Outcome: Progressing   

## 2022-06-18 NOTE — Progress Notes (Signed)
Pt awake talking oriented denies pain present at this time. Pale in color see flowsheet for vitals.  Hospice nurse to bedside @ 1530 recommended patient to stay due to unstable vitals. @ 1700 patient drowsy in and out of sleeping complains with nausea IV Zofran given. Family present at bedside. Pt does not appear to be in any distress at this time.

## 2022-06-18 NOTE — Progress Notes (Signed)
PROGRESS NOTE    Jonathan Wise  [id number] DOB: 09-06-1945 DOA: 07/09/2022 PCP: Christain Sacramento, MD   Brief Narrative:   Jonathan Wise is a 77 y.o. male with medical history significant of type 2 diabetes mellitus, A-fib on Eliquis, hyperlipidemia, chronic respiratory failure on 3 LPM of oxygen at baseline who was on hospice and now presented to the ED due to nausea on waking up this morning, this was followed with vomitus x 1 which was described as coffee-ground in color.  Patient was admitted with possible GI bleed as well as AKI and presumed UTI.  Patient is now comfort care.  Assessment & Plan:   Principal Problem:   Acute kidney injury ([place]) Active Problems:   Hypotension   Venous stasis ulcers (HCC)   Diarrhea   Dehydration   Chronic respiratory failure with hypoxia (HCC)   GI bleed   Sacral decubitus ulcer  Assessment and Plan:  Worsening hypotension in the setting of possible GI bleed with AKI as well as UTI.  Patient now transition to comfort care with anticipated in-hospital death expected.  Prognosis of hours to days.  He is not stable for transfer.   DVT prophylaxis:SCDs Code Status: DNR/comfort care Family Communication: Discussed with granddaughter on phone 3/9 Disposition Plan: Anticipated in-hospital death Status is: Inpatient Remains inpatient appropriate because: Need for IV fluids and medications.  Skin Assessment:  I have examined the patient's skin and I agree with the wound assessment as performed by the wound care RN as outlined below:  Pressure Injury 05/14/19 Ischial tuberosity Right Stage 2 -  Partial thickness loss of dermis presenting as a shallow open injury with a red, pink wound bed without slough. WOC updated FS to list left and right ischial wounds separately: copied and paste (Active)  05/14/19 [id number]  Location: Ischial tuberosity  Location Orientation: Right  Staging: Stage 2 -  Partial thickness loss of dermis presenting as a  shallow open injury with a red, pink wound bed without slough.  Wound Description (Comments): WOC updated FS to list left and right ischial wounds separately: copied and pasted this note from admitting RN  right black wound about 1 cm round on right buttock cheek. Entire buttock area red non blanchable   Present on Admission: Yes     Pressure Injury 06/18/19 Sacrum Stage 1 -  Intact skin with non-blanchable redness of a localized area usually over a bony prominence. excoriated redness noted to sacrum that extends to perineum and testicles.  (Active)  06/18/19 1309 (history of same from previous visit. present at time of arrival to ED per AM report.)  Location: Sacrum  Location Orientation:   Staging: Stage 1 -  Intact skin with non-blanchable redness of a localized area usually over a bony prominence.  Wound Description (Comments): excoriated redness noted to sacrum that extends to perineum and testicles.   Present on Admission: Yes (present at time of arrival to ED per AM report.)     Pressure Injury 06/17/22 Buttocks Left Stage 2 -  Partial thickness loss of dermis presenting as a shallow open injury with a red, pink wound bed without slough. (Active)  06/17/22 0056  Location: Buttocks  Location Orientation: Left  Staging: Stage 2 -  Partial thickness loss of dermis presenting as a shallow open injury with a red, pink wound bed without slough.  Wound Description (Comments):   Present on Admission: Yes  Dressing Type Foam - Lift dressing to assess site every shift 06/18/22 0456  Pressure Injury 06/17/22 Buttocks Left Stage 2 -  Partial thickness loss of dermis presenting as a shallow open injury with a red, pink wound bed without slough. lowe inner buttocks (Active)  06/17/22 0056  Location: Buttocks  Location Orientation: Left  Staging: Stage 2 -  Partial thickness loss of dermis presenting as a shallow open injury with a red, pink wound bed without slough.  Wound Description  (Comments): lowe inner buttocks  Present on Admission: Yes  Dressing Type Foam - Lift dressing to assess site every shift 06/18/22 0456     Pressure Injury 06/17/22 Buttocks Right Deep Tissue Pressure Injury - Purple or maroon localized area of discolored intact skin or blood-filled blister due to damage of underlying soft tissue from pressure and/or shear. (Active)  06/17/22 0056  Location: Buttocks  Location Orientation: Right  Staging: Deep Tissue Pressure Injury - Purple or maroon localized area of discolored intact skin or blood-filled blister due to damage of underlying soft tissue from pressure and/or shear.  Wound Description (Comments):   Present on Admission: Yes  Dressing Type Foam - Lift dressing to assess site every shift 06/18/22 0456    Consultants:  Palliative care  Procedures:  None  Antimicrobials:  Anti-infectives (From admission, onward)    Start     Dose/Rate Route Frequency Ordered Stop   06/17/22 1000  cefTRIAXone (ROCEPHIN) 1 g in sodium chloride 0.9 % 100 mL IVPB  Status:  Discontinued        1 g 200 mL/hr over 30 Minutes Intravenous Every 24 hours 06/17/22 0412 06/17/22 0856   07/01/2022 2030  cefTRIAXone (ROCEPHIN) 2 g in sodium chloride 0.9 % 100 mL IVPB        2 g 200 mL/hr over 30 Minutes Intravenous  Once 06/30/2022 2019 06/20/2022 2151      Subjective: Patient seen and evaluated today with no new acute complaints or concerns. No acute concerns or events noted overnight.  Objective: Vitals:   06/17/22 1800 06/17/22 1827 06/17/22 1845 06/18/22 0505  BP:   (!) 87/49 (!) 87/38  Pulse: (!) 135  (!) 136 (!) 138  Resp: 20   17  Temp:  98.1 F (36.7 C) (!) 97.5 F (36.4 C) 97.6 F (36.4 C)  TempSrc:  Axillary Oral   SpO2: 100%  98% 100%  Weight:      Height:        Intake/Output Summary (Last 24 hours) at 06/18/2022 1148 Last data filed at 06/18/2022 0600 Gross per 24 hour  Intake 50 ml  Output 601 ml  Net -551 ml   Filed Weights   06/28/2022  1606 06/17/22 0056  Weight: 121.1 kg 129.6 kg    Examination:  General exam: Appears calm and comfortable  Respiratory system: Clear to auscultation. Respiratory effort normal. Cardiovascular system: S1 & S2 heard, RRR.  Gastrointestinal system: Abdomen is soft Central nervous system: Alert and awake Extremities: No edema Skin: No significant lesions noted Psychiatry: Flat affect.    Data Reviewed: I have personally reviewed following labs and imaging studies  CBC: Recent Labs  Lab 06/14/2022 1615 06/17/22 0409  WBC 19.1* 21.2*  HGB 7.9* 7.8*  HCT 31.3* 31.7*  MCV 76.3* 77.9*  PLT 434* [id number]   Basic Metabolic Panel: Recent Labs  Lab 07/05/2022 1615 06/17/22 0409  NA 134* 137  K 4.3 4.3  CL 94* 99  CO2 27 24  GLUCOSE 153* 134*  BUN 45* 44*  CREATININE 2.60* 2.52*  CALCIUM 8.0* 7.9*  MG  --  2.4  PHOS  --  6.4*   GFR: Estimated Creatinine Clearance: 34.7 mL/min (A) (by C-G formula based on SCr of 2.52 mg/dL (H)). Liver Function Tests: Recent Labs  Lab 07/06/2022 1615 06/17/22 0409  AST 24 20  ALT 12 11  ALKPHOS 78 75  BILITOT 1.4* 1.1  PROT 6.7 6.6  ALBUMIN 2.5* 2.6*   Recent Labs  Lab 06/18/2022 1615  LIPASE 30   No results for input(s): "AMMONIA" in the last 168 hours. Coagulation Profile: No results for input(s): "INR", "PROTIME" in the last 168 hours. Cardiac Enzymes: No results for input(s): "CKTOTAL", "CKMB", "CKMBINDEX", "TROPONINI" in the last 168 hours. BNP (last 3 results) No results for input(s): "PROBNP" in the last 8760 hours. HbA1C: No results for input(s): "HGBA1C" in the last 72 hours. CBG: Recent Labs  Lab 06/17/22 0042  GLUCAP 130*   Lipid Profile: No results for input(s): "CHOL", "HDL", "LDLCALC", "TRIG", "CHOLHDL", "LDLDIRECT" in the last 72 hours. Thyroid Function Tests: No results for input(s): "TSH", "T4TOTAL", "FREET4", "T3FREE", "THYROIDAB" in the last 72 hours. Anemia Panel: No results for input(s): "VITAMINB12",  "FOLATE", "FERRITIN", "TIBC", "IRON", "RETICCTPCT" in the last 72 hours. Sepsis Labs: Recent Labs  Lab 06/21/2022 1616 06/24/2022 1854  LATICACIDVEN 2.3* 1.1    Recent Results (from the past 240 hour(s))  Urine Culture     Status: Abnormal   Collection Time: 06/24/2022  4:52 PM   Specimen: Urine, Catheterized  Result Value Ref Range Status   Specimen Description   Final    URINE, CATHETERIZED Performed at [facility], [address], [place], [place] [address]    Special Requests   Final    NONE Performed at [facility], [address] [place], [place] [address]    Culture MULTIPLE SPECIES PRESENT, SUGGEST RECOLLECTION (A)  Final   Report Status 06/18/2022 FINAL  Final  Culture, blood (routine x 2)     Status: None (Preliminary result)   Collection Time: 06/28/2022  5:25 PM   Specimen: BLOOD RIGHT WRIST  Result Value Ref Range Status   Specimen Description   Final    BLOOD RIGHT WRIST BOTTLES DRAWN AEROBIC AND ANAEROBIC   Special Requests Blood Culture adequate volume  Final   Culture   Final    NO GROWTH 2 DAYS Performed at [facility], [address] [place], [place] [address]    Report Status PENDING  Incomplete  Culture, blood (routine x 2)     Status: None (Preliminary result)   Collection Time: 06/13/2022  5:25 PM   Specimen: Left Antecubital; Blood  Result Value Ref Range Status   Specimen Description   Final    LEFT ANTECUBITAL BOTTLES DRAWN AEROBIC AND ANAEROBIC   Special Requests Blood Culture adequate volume  Final   Culture   Final    NO GROWTH 2 DAYS Performed at [facility], [address], [place], [place] [address]    Report Status PENDING  Incomplete  Resp panel by RT-PCR (RSV, Flu A&B, Covid) Anterior Nasal Swab     Status: None   Collection Time: 07/03/2022  6:01 PM   Specimen: Anterior Nasal Swab  Result Value Ref Range Status   SARS Coronavirus 2 by RT PCR NEGATIVE NEGATIVE Final    Comment: (NOTE) SARS-CoV-2 target nucleic acids are NOT  DETECTED.  The SARS-CoV-2 RNA is generally detectable in upper respiratory specimens during the acute phase of infection. The lowest concentration of SARS-CoV-2 viral copies this assay can detect is 138 copies/mL. A  negative result does not preclude SARS-Cov-2 infection and should not be used as the sole basis for treatment or other patient management decisions. A negative result may occur with  improper specimen collection/handling, submission of specimen other than nasopharyngeal swab, presence of viral mutation(s) within the areas targeted by this assay, and inadequate number of viral copies(<138 copies/mL). A negative result must be combined with clinical observations, patient history, and epidemiological information. The expected result is Negative.  Fact Sheet for Patients:  EntrepreneurPulse.com.au  Fact Sheet for Healthcare Providers:  IncredibleEmployment.be  This test is no t yet approved or cleared by the [place] FDA and  has been authorized for detection and/or diagnosis of SARS-CoV-2 by FDA under an Emergency Use Authorization (EUA). This EUA will remain  in effect (meaning this test can be used) for the duration of the COVID-19 declaration under Section 564(b)(1) of the Act, 21 U.S.C.section 360bbb-3(b)(1), unless the authorization is terminated  or revoked sooner.       Influenza A by PCR NEGATIVE NEGATIVE Final   Influenza B by PCR NEGATIVE NEGATIVE Final    Comment: (NOTE) The Xpert Xpress SARS-CoV-2/FLU/RSV plus assay is intended as an aid in the diagnosis of influenza from Nasopharyngeal swab specimens and should not be used as a sole basis for treatment. Nasal washings and aspirates are unacceptable for Xpert Xpress SARS-CoV-2/FLU/RSV testing.  Fact Sheet for Patients: EntrepreneurPulse.com.au  Fact Sheet for Healthcare Providers: IncredibleEmployment.be  This test is not yet  approved or cleared by the [place] FDA and has been authorized for detection and/or diagnosis of SARS-CoV-2 by FDA under an Emergency Use Authorization (EUA). This EUA will remain in effect (meaning this test can be used) for the duration of the COVID-19 declaration under Section 564(b)(1) of the Act, 21 U.S.C. section 360bbb-3(b)(1), unless the authorization is terminated or revoked.     Resp Syncytial Virus by PCR NEGATIVE NEGATIVE Final    Comment: (NOTE) Fact Sheet for Patients: EntrepreneurPulse.com.au  Fact Sheet for Healthcare Providers: IncredibleEmployment.be  This test is not yet approved or cleared by the [place] FDA and has been authorized for detection and/or diagnosis of SARS-CoV-2 by FDA under an Emergency Use Authorization (EUA). This EUA will remain in effect (meaning this test can be used) for the duration of the COVID-19 declaration under Section 564(b)(1) of the Act, 21 U.S.C. section 360bbb-3(b)(1), unless the authorization is terminated or revoked.  Performed at [facility], [address] [place], [place] [address]   MRSA Next Gen by PCR, Nasal     Status: Abnormal   Collection Time: 06/17/22 12:30 AM   Specimen: Nasal Mucosa; Nasal Swab  Result Value Ref Range Status   MRSA by PCR Next Gen DETECTED (A) NOT DETECTED Final    Comment: RESULT CALLED TO, READ BACK BY AND VERIFIED WITH: HYLTON L @ [id number] ON [id number] BY HENDERSON L (NOTE) The GeneXpert MRSA Assay (FDA approved for NASAL specimens only), is one component of a comprehensive MRSA colonization surveillance program. It is not intended to diagnose MRSA infection nor to guide or monitor treatment for MRSA infections. Test performance is not FDA approved in patients less than 75 years old. Performed at [facility], [address], [place], [place] [address]          Radiology Studies: [facility] 1 View  Result Date: 06/17/2022 CLINICAL DATA:   Generalized weakness, vomiting EXAM: PORTABLE CHEST 1 VIEW COMPARISON:  03/09/2020 FINDINGS: Transverse diameter of heart is increased. There are no signs  of alveolar pulmonary edema. There is haziness in right lower lung field suggesting pleural effusion and possibly underlying infiltrate. There is possible bulla in the medial right lower lung fields. There is interval improvement in the aeration in left lower lung fields with residual increased density. There is no pneumothorax. IMPRESSION: Cardiomegaly. There are no signs of pulmonary edema or new focal infiltrates. There is interval improvement in the aeration in left lower lung fields suggesting decrease in pleural effusion and decrease in infiltrate. There is haziness in the lateral aspect of right lower lung field with interval improvement suggesting possible decrease in amount of right pleural effusion. Electronically Signed   By: Elmer Picker M.D.   On: 07/06/2022 18:00        Scheduled Meds:  Chlorhexidine Gluconate Cloth  6 each Topical Q0600   feeding supplement  237 mL Oral BID BM   ondansetron (ZOFRAN) IV  4 mg Intravenous Once       LOS: 2 days    Time spent: 35 minutes    Garrin Kirwan Darleen Crocker, DO [facility]  If 7PM-7AM, please contact night-coverage [email or web address] 06/18/2022, 11:48 AM

## 2022-06-19 DIAGNOSIS — Z515 Encounter for palliative care: Secondary | ICD-10-CM | POA: Diagnosis not present

## 2022-06-19 DIAGNOSIS — R112 Nausea with vomiting, unspecified: Secondary | ICD-10-CM

## 2022-06-19 DIAGNOSIS — Z66 Do not resuscitate: Secondary | ICD-10-CM

## 2022-06-19 DIAGNOSIS — Z7189 Other specified counseling: Secondary | ICD-10-CM

## 2022-06-19 DIAGNOSIS — N179 Acute kidney failure, unspecified: Secondary | ICD-10-CM | POA: Diagnosis not present

## 2022-06-19 LAB — HEMOGLOBIN A1C
Hgb A1c MFr Bld: 7.2 % — ABNORMAL HIGH (ref 4.8–5.6)
Mean Plasma Glucose: 160 mg/dL

## 2022-06-19 NOTE — Progress Notes (Signed)
Patient yelled out during this shift. PRN comfort meds given as ordered. See flowsheet for VS.

## 2022-06-19 NOTE — Plan of Care (Signed)
  Problem: Pain Managment: Goal: General experience of comfort will improve Outcome: Progressing   Problem: Activity: Goal: Risk for activity intolerance will decrease Outcome: Not Progressing   Problem: Nutrition: Goal: Adequate nutrition will be maintained Outcome: Not Progressing   Problem: Skin Integrity: Goal: Risk for impaired skin integrity will decrease Outcome: Not Progressing   

## 2022-06-19 NOTE — Progress Notes (Signed)
PROGRESS NOTE    Jonathan Wise  [id number] DOB: 12-Nov-1945 DOA: 06/23/2022 PCP: Christain Sacramento, MD   Brief Narrative:   Jonathan Wise is a 77 y.o. male with medical history significant of type 2 diabetes mellitus, A-fib on Eliquis, hyperlipidemia, chronic respiratory failure on 3 LPM of oxygen at baseline who was on hospice and now presented to the ED due to nausea on waking up this morning, this was followed with vomitus x 1 which was described as coffee-ground in color.  Patient was admitted with possible GI bleed as well as AKI and presumed UTI.  Patient is now comfort care.  Assessment & Plan:   Principal Problem:   Acute kidney injury ([place]) Active Problems:   Hypotension   Venous stasis ulcers (HCC)   Diarrhea   Dehydration   Chronic respiratory failure with hypoxia (HCC)   GI bleed   Sacral decubitus ulcer  Assessment and Plan:  Worsening hypotension in the setting of possible GI bleed with AKI as well as UTI.  Patient now transition to comfort care with anticipated in-hospital death expected.  Prognosis of hours to days.  He is not stable for transfer.   DVT prophylaxis:SCDs Code Status: DNR/comfort care Family Communication: Discussed with granddaughter on phone 3/9, wife at bedside 3/11 Disposition Plan: Anticipated in-hospital death Status is: Inpatient Remains inpatient appropriate because: Need for IV fluids and medications.  Skin Assessment:  I have examined the patient's skin and I agree with the wound assessment as performed by the wound care RN as outlined below:  Pressure Injury 05/14/19 Ischial tuberosity Right Stage 2 -  Partial thickness loss of dermis presenting as a shallow open injury with a red, pink wound bed without slough. WOC updated FS to list left and right ischial wounds separately: copied and paste (Active)  05/14/19 [id number]  Location: Ischial tuberosity  Location Orientation: Right  Staging: Stage 2 -  Partial thickness loss of dermis  presenting as a shallow open injury with a red, pink wound bed without slough.  Wound Description (Comments): WOC updated FS to list left and right ischial wounds separately: copied and pasted this note from admitting RN  right black wound about 1 cm round on right buttock cheek. Entire buttock area red non blanchable   Present on Admission: Yes     Pressure Injury 06/18/19 Sacrum Stage 1 -  Intact skin with non-blanchable redness of a localized area usually over a bony prominence. excoriated redness noted to sacrum that extends to perineum and testicles.  (Active)  06/18/19 1309 (history of same from previous visit. present at time of arrival to ED per AM report.)  Location: Sacrum  Location Orientation:   Staging: Stage 1 -  Intact skin with non-blanchable redness of a localized area usually over a bony prominence.  Wound Description (Comments): excoriated redness noted to sacrum that extends to perineum and testicles.   Present on Admission: Yes (present at time of arrival to ED per AM report.)     Pressure Injury 06/17/22 Buttocks Left Stage 2 -  Partial thickness loss of dermis presenting as a shallow open injury with a red, pink wound bed without slough. (Active)  06/17/22 0056  Location: Buttocks  Location Orientation: Left  Staging: Stage 2 -  Partial thickness loss of dermis presenting as a shallow open injury with a red, pink wound bed without slough.  Wound Description (Comments):   Present on Admission: Yes  Dressing Type Foam - Lift dressing to assess site  every shift 06/18/22 0456     Pressure Injury 06/17/22 Buttocks Left Stage 2 -  Partial thickness loss of dermis presenting as a shallow open injury with a red, pink wound bed without slough. lowe inner buttocks (Active)  06/17/22 0056  Location: Buttocks  Location Orientation: Left  Staging: Stage 2 -  Partial thickness loss of dermis presenting as a shallow open injury with a red, pink wound bed without slough.  Wound  Description (Comments): lowe inner buttocks  Present on Admission: Yes  Dressing Type Foam - Lift dressing to assess site every shift 06/18/22 0456     Pressure Injury 06/17/22 Buttocks Right Deep Tissue Pressure Injury - Purple or maroon localized area of discolored intact skin or blood-filled blister due to damage of underlying soft tissue from pressure and/or shear. (Active)  06/17/22 0056  Location: Buttocks  Location Orientation: Right  Staging: Deep Tissue Pressure Injury - Purple or maroon localized area of discolored intact skin or blood-filled blister due to damage of underlying soft tissue from pressure and/or shear.  Wound Description (Comments):   Present on Admission: Yes  Dressing Type Foam - Lift dressing to assess site every shift 06/18/22 0456    Consultants:  Palliative care  Procedures:  None  Antimicrobials:  Anti-infectives (From admission, onward)    Start     Dose/Rate Route Frequency Ordered Stop   06/17/22 1000  cefTRIAXone (ROCEPHIN) 1 g in sodium chloride 0.9 % 100 mL IVPB  Status:  Discontinued        1 g 200 mL/hr over 30 Minutes Intravenous Every 24 hours 06/17/22 0412 06/17/22 0856   07/01/2022 2030  cefTRIAXone (ROCEPHIN) 2 g in sodium chloride 0.9 % 100 mL IVPB        2 g 200 mL/hr over 30 Minutes Intravenous  Once 07/08/2022 2019 06/14/2022 2151      Subjective: Patient seen and evaluated today with no new acute complaints or concerns. No acute concerns or events noted overnight.  Objective: Vitals:   06/17/22 1845 06/18/22 0505 06/18/22 1402 06/19/22 0439  BP: (!) 87/49 (!) 87/38 (!) 68/46 (!) 79/61  Pulse: (!) 136 (!) 138 (!) 119 (!) 111  Resp:  17  20  Temp: (!) 97.5 F (36.4 C) 97.6 F (36.4 C) 97.9 F (36.6 C) 97.9 F (36.6 C)  TempSrc: Oral  Axillary   SpO2: 98% 100%  95%  Weight:      Height:       No intake or output data in the 24 hours ending 06/19/22 1255  Filed Weights   06/15/2022 1606 06/17/22 0056  Weight: 121.1 kg 129.6  kg    Examination:  General exam: Appears calm and comfortable, mostly unresponsive Respiratory system: Clear to auscultation. Respiratory effort normal.  1 L nasal cannula Cardiovascular system: S1 & S2 heard, RRR.  Gastrointestinal system: Abdomen is soft Central nervous system: Somnolent, unresponsive Extremities: No edema Skin: No significant lesions noted Psychiatry: Flat affect.    Data Reviewed: I have personally reviewed following labs and imaging studies  CBC: Recent Labs  Lab 06/18/2022 1615 06/17/22 0409  WBC 19.1* 21.2*  HGB 7.9* 7.8*  HCT 31.3* 31.7*  MCV 76.3* 77.9*  PLT 434* [id number]   Basic Metabolic Panel: Recent Labs  Lab 06/28/2022 1615 06/17/22 0409  NA 134* 137  K 4.3 4.3  CL 94* 99  CO2 27 24  GLUCOSE 153* 134*  BUN 45* 44*  CREATININE 2.60* 2.52*  CALCIUM 8.0* 7.9*  MG  --  2.4  PHOS  --  6.4*   GFR: Estimated Creatinine Clearance: 34.7 mL/min (A) (by C-G formula based on SCr of 2.52 mg/dL (H)). Liver Function Tests: Recent Labs  Lab 06/28/2022 1615 06/17/22 0409  AST 24 20  ALT 12 11  ALKPHOS 78 75  BILITOT 1.4* 1.1  PROT 6.7 6.6  ALBUMIN 2.5* 2.6*   Recent Labs  Lab 07/09/2022 1615  LIPASE 30   No results for input(s): "AMMONIA" in the last 168 hours. Coagulation Profile: No results for input(s): "INR", "PROTIME" in the last 168 hours. Cardiac Enzymes: No results for input(s): "CKTOTAL", "CKMB", "CKMBINDEX", "TROPONINI" in the last 168 hours. BNP (last 3 results) No results for input(s): "PROBNP" in the last 8760 hours. HbA1C: Recent Labs    06/17/22 0409  HGBA1C 7.2*   CBG: Recent Labs  Lab 06/17/22 0042  GLUCAP 130*   Lipid Profile: No results for input(s): "CHOL", "HDL", "LDLCALC", "TRIG", "CHOLHDL", "LDLDIRECT" in the last 72 hours. Thyroid Function Tests: No results for input(s): "TSH", "T4TOTAL", "FREET4", "T3FREE", "THYROIDAB" in the last 72 hours. Anemia Panel: No results for input(s): "VITAMINB12", "FOLATE",  "FERRITIN", "TIBC", "IRON", "RETICCTPCT" in the last 72 hours. Sepsis Labs: Recent Labs  Lab 06/13/2022 1616 06/17/2022 1854  LATICACIDVEN 2.3* 1.1    Recent Results (from the past 240 hour(s))  Urine Culture     Status: Abnormal   Collection Time: 06/20/2022  4:52 PM   Specimen: Urine, Catheterized  Result Value Ref Range Status   Specimen Description   Final    URINE, CATHETERIZED Performed at [facility], [address], [place], [place] [address]    Special Requests   Final    NONE Performed at [facility], [address] [place], [place] [address]    Culture MULTIPLE SPECIES PRESENT, SUGGEST RECOLLECTION (A)  Final   Report Status 06/18/2022 FINAL  Final  Culture, blood (routine x 2)     Status: None (Preliminary result)   Collection Time: 07/07/2022  5:25 PM   Specimen: BLOOD RIGHT WRIST  Result Value Ref Range Status   Specimen Description   Final    BLOOD RIGHT WRIST BOTTLES DRAWN AEROBIC AND ANAEROBIC   Special Requests Blood Culture adequate volume  Final   Culture   Final    NO GROWTH 3 DAYS Performed at [facility], [address], [place], [place] [address]    Report Status PENDING  Incomplete  Culture, blood (routine x 2)     Status: None (Preliminary result)   Collection Time: 07/02/2022  5:25 PM   Specimen: Left Antecubital; Blood  Result Value Ref Range Status   Specimen Description   Final    LEFT ANTECUBITAL BOTTLES DRAWN AEROBIC AND ANAEROBIC   Special Requests Blood Culture adequate volume  Final   Culture   Final    NO GROWTH 3 DAYS Performed at [facility], [address] [place], [place] [address]    Report Status PENDING  Incomplete  Resp panel by RT-PCR (RSV, Flu A&B, Covid) Anterior Nasal Swab     Status: None   Collection Time: 07/03/2022  6:01 PM   Specimen: Anterior Nasal Swab  Result Value Ref Range Status   SARS Coronavirus 2 by RT PCR NEGATIVE NEGATIVE Final    Comment: (NOTE) SARS-CoV-2 target nucleic acids are NOT DETECTED.  The  SARS-CoV-2 RNA is generally detectable in upper respiratory specimens during the acute phase of infection. The lowest concentration of SARS-CoV-2 viral copies this assay can detect is 138  copies/mL. A negative result does not preclude SARS-Cov-2 infection and should not be used as the sole basis for treatment or other patient management decisions. A negative result may occur with  improper specimen collection/handling, submission of specimen other than nasopharyngeal swab, presence of viral mutation(s) within the areas targeted by this assay, and inadequate number of viral copies(<138 copies/mL). A negative result must be combined with clinical observations, patient history, and epidemiological information. The expected result is Negative.  Fact Sheet for Patients:  EntrepreneurPulse.com.au  Fact Sheet for Healthcare Providers:  IncredibleEmployment.be  This test is no t yet approved or cleared by the [place] FDA and  has been authorized for detection and/or diagnosis of SARS-CoV-2 by FDA under an Emergency Use Authorization (EUA). This EUA will remain  in effect (meaning this test can be used) for the duration of the COVID-19 declaration under Section 564(b)(1) of the Act, 21 U.S.C.section 360bbb-3(b)(1), unless the authorization is terminated  or revoked sooner.       Influenza A by PCR NEGATIVE NEGATIVE Final   Influenza B by PCR NEGATIVE NEGATIVE Final    Comment: (NOTE) The Xpert Xpress SARS-CoV-2/FLU/RSV plus assay is intended as an aid in the diagnosis of influenza from Nasopharyngeal swab specimens and should not be used as a sole basis for treatment. Nasal washings and aspirates are unacceptable for Xpert Xpress SARS-CoV-2/FLU/RSV testing.  Fact Sheet for Patients: EntrepreneurPulse.com.au  Fact Sheet for Healthcare Providers: IncredibleEmployment.be  This test is not yet approved or  cleared by the [place] FDA and has been authorized for detection and/or diagnosis of SARS-CoV-2 by FDA under an Emergency Use Authorization (EUA). This EUA will remain in effect (meaning this test can be used) for the duration of the COVID-19 declaration under Section 564(b)(1) of the Act, 21 U.S.C. section 360bbb-3(b)(1), unless the authorization is terminated or revoked.     Resp Syncytial Virus by PCR NEGATIVE NEGATIVE Final    Comment: (NOTE) Fact Sheet for Patients: EntrepreneurPulse.com.au  Fact Sheet for Healthcare Providers: IncredibleEmployment.be  This test is not yet approved or cleared by the [place] FDA and has been authorized for detection and/or diagnosis of SARS-CoV-2 by FDA under an Emergency Use Authorization (EUA). This EUA will remain in effect (meaning this test can be used) for the duration of the COVID-19 declaration under Section 564(b)(1) of the Act, 21 U.S.C. section 360bbb-3(b)(1), unless the authorization is terminated or revoked.  Performed at [facility], [address] [place], [place] [address]   MRSA Next Gen by PCR, Nasal     Status: Abnormal   Collection Time: 06/17/22 12:30 AM   Specimen: Nasal Mucosa; Nasal Swab  Result Value Ref Range Status   MRSA by PCR Next Gen DETECTED (A) NOT DETECTED Final    Comment: RESULT CALLED TO, READ BACK BY AND VERIFIED WITH: HYLTON L @ [id number] ON [id number] BY HENDERSON L (NOTE) The GeneXpert MRSA Assay (FDA approved for NASAL specimens only), is one component of a comprehensive MRSA colonization surveillance program. It is not intended to diagnose MRSA infection nor to guide or monitor treatment for MRSA infections. Test performance is not FDA approved in patients less than 36 years old. Performed at [facility], [address], [place], [place] [address]          Radiology Studies: No results found.      Scheduled Meds:  Chlorhexidine Gluconate Cloth   6 each Topical Q0600   feeding supplement  237 mL Oral BID BM   ondansetron (  ZOFRAN) IV  4 mg Intravenous Once       LOS: 3 days    Time spent: 35 minutes    Jenesis Martin Darleen Crocker, DO [facility]  If 7PM-7AM, please contact night-coverage [email or web address] 06/19/2022, 12:55 PM

## 2022-06-19 NOTE — Consult Note (Addendum)
Palliative Care Consult Note                                  Date: 06/19/2022   Patient Name: Jonathan Wise  DOB: May 23, 1945  MRN: [id number]  Age / Sex: 77 y.o., male  PCP: Christain Sacramento, MD Referring Physician: Rodena Goldmann, DO  Reason for Consultation: Terminal Care  HPI/Patient Profile: 77 y.o. male  with past medical history of type 2 diabetes mellitus, A-fib on Eliquis, hyperlipidemia, chronic respiratory failure on 3 LPM of oxygen at baseline who was on hospice and now presented to the ED due to nausea on waking up this morning, this was followed with vomitus x 1 which was described as coffee-ground in color.  He was admitted on 06/17/2022 with possible GI bleed as well as AKI and presumed UTI. Patient is now comfort care.  PMT was consulted for comfort care/terminal care.  He was previously seen by palliative medicine in August and December 2021.  Past Medical History:  Diagnosis Date   Atrial fibrillation ([place])    On Pradaxa   Bilateral lower extremity edema    Cellulitis    DVT (deep venous thrombosis) (HCC) 1994   LLE. Completed tx with coumadin   Hyperlipidemia    Hypertension    Kidney stones    Morbid obesity ([place])    Pressure ulcer    Sleep apnea    Type 2 diabetes mellitus ([place])    Urinary retention 10/15/2011   Venous stasis ulcers (HCC)     Subjective:   This NP Walden Field reviewed medical records, received report from team, assessed the patient and then meet at the patient's bedside to discuss diagnosis, prognosis, GOC, EOL wishes disposition and options.  I met with the patient at the bedside who is somnolent/lethargic and not able to meaningfully communicate.  His wife was present at the bedside as well.   Concept of Palliative Care was introduced as specialized medical care for people and their families living with serious illness.  If focuses on providing relief from the symptoms and stress of a  serious illness.  The goal is to improve quality of life for both the patient and the family. Values and goals of care important to patient and family were attempted to be elicited.  Created space and opportunity for patient  and family to explore thoughts and feelings regarding current medical situation   Natural trajectory and current clinical status were discussed. Questions and concerns addressed. Patient  encouraged to call with questions or concerns.    Patient/Family Understanding of Illness: The patient's wife states that he is on hospice secondary to her heart issues and wounds.  She expresses that hospice is providing good care at home.  The nurse visits Monday and Thursday, and 8 visits Tuesday and Thursday, and they also have as needed assistance.  She notes that on Friday morning he vomited and it was black.  This has not previously occurred before.  We explained the significance of black emesis as possible GI bleed.  Life Review: The patient and his wife have been married for 29 years.  They had 1 daughter who has since passed away.  They have 3 granddaughters, 1 great grandson, 1 great granddaughter.  She describes him as "sort of" religious/spiritual.  Previously he worked as a Administrator.  He enjoyed watching TV and going to softball games.  Patient Values:  Family, comfort at end-of-life  Goals: Continued comfort care until passing  Today's Discussion: In addition to discussions described above we had extensive discussion on various topics.  We discussed comfort care.  Similar to hospice care, with comfort care the patient no longer receives aggressive medical interventions such as continuous vital signs, lab work, radiology testing, or medications not focused on comfort. All care is focused on how the patient is looking and feeling. This includes management of any symptoms that may cause discomfort, pain, shortness of breath, cough, nausea, agitation, anxiety, and/or  secretions etc. Symptoms will be managed with medications and other non-pharmacological interventions such as spiritual support if requested, repositioning, music therapy, or therapeutic listening. Family verbalized understanding and appreciation.  We discussed the likelihood of in-hospital death, which she understands.  Today the patient's wife states that he has looked comfortable and is really "in and out of it" but with no overt signs of discomfort or anxiety/agitation.  During my visit I agree with her assessment as the patient seems to be resting comfortably.  When I call his name and holds his hand he does attempt to open his eyes and mumbles, but no meaningful communication.  I provided emotional and general support through therapeutic listening, empathy, sharing of stories, therapeutic touch, and other techniques. I answered all questions and addressed all concerns to the best of my ability.  I offered consult to chaplain for spiritual care support, which she has accepted.   Review of Systems  Unable to perform ROS: Acuity of condition    Objective:   Primary Diagnoses: Present on Admission:  Acute kidney injury ([place])  Venous stasis ulcers ([place])  Diarrhea  Hypotension   Physical Exam Vitals and nursing note reviewed.  Constitutional:      General: He is sleeping. He is not in acute distress.    Appearance: He is ill-appearing.  Cardiovascular:     Rate and Rhythm: Tachycardia present.  Pulmonary:     Effort: Pulmonary effort is normal. No respiratory distress.  Abdominal:     General: There is no distension.     Palpations: Abdomen is soft.  Skin:    General: Skin is warm and dry.  Neurological:     Mental Status: He is lethargic.     Comments: Minimally responsive     Vital Signs:  BP (!) 79/61 (BP Location: Right Arm)   Pulse (!) 111   Temp 97.9 F (36.6 C)   Resp 20   Ht 6\' 1"  (1.854 m)   Wt 129.6 kg   SpO2 95%   BMI 37.70 kg/m   Palliative  Assessment/Data: 10%    Advanced Care Planning:   Existing Vynca/ACP Documentation: DNR uploaded 12/27/2015  Primary Decision Maker: NEXT OF KIN  Code Status/Advance Care Planning: DNR  A discussion was had today regarding advanced directives. Concepts specific to code status, artifical feeding and hydration, continued IV antibiotics and rehospitalization was had.  The difference between a aggressive medical intervention path and a palliative comfort care path for this patient at this time was had.   Decisions/Changes to ACP: None today  Assessment & Plan:   Impression: 77 year old male who is active with hospice who came to the hospital for black/coffee-ground emesis.  He remains in the hospital on comfort care/end-of-life care.  Offered and accepted chaplain consult.  Anticipate hospital death.  Overall prognosis grave.  SUMMARY OF RECOMMENDATIONS   Remain DNR Continue comfort care Symptom management orders as per below Palliative medicine is available  to nursing staff as needed for symptom management order changes Please notify us of any apparent discomfort or distress PMT will continue to follow  Symptom Management:  Tylenol 650 mg PR every 6 hours as needed mild pain or fever Robinul 0.2 mg IV every 4 hours as needed excessive secretions Ativan 1 mg IV every 4 hours as needed anxiety Morphine 1 mg IV every 2 hours as needed severe pain or dyspnea Zofran 4 mg IV every 6 hours as needed nausea  Prognosis:  Hours - Days  Discharge Planning:  Anticipated Hospital Death   Discussed with: Patient's family, medical team, nursing team    Thank you for allowing [place] to participate in the care of SHANTELL DARKE PMT will continue to support holistically.  Billing based on MDM: High  Problems Addressed: One acute or chronic illness or injury that poses a threat to life or bodily function  Amount and/or Complexity of Data: Category 3:Discussion of management or test  interpretation with external physician/other qualified health care professional/appropriate source (not separately reported)  Risks: Parenteral controlled substances  Signed by: Walden Field, NP Palliative Medicine Team  Team Phone # [phone] (Nights/Weekends)  06/19/2022, 11:04 AM

## 2022-06-20 DIAGNOSIS — L97416 Non-pressure chronic ulcer of right heel and midfoot with bone involvement without evidence of necrosis: Secondary | ICD-10-CM

## 2022-06-20 DIAGNOSIS — Z7189 Other specified counseling: Secondary | ICD-10-CM | POA: Diagnosis not present

## 2022-06-20 DIAGNOSIS — R112 Nausea with vomiting, unspecified: Secondary | ICD-10-CM | POA: Diagnosis not present

## 2022-06-20 DIAGNOSIS — Z515 Encounter for palliative care: Secondary | ICD-10-CM | POA: Diagnosis not present

## 2022-06-20 DIAGNOSIS — N179 Acute kidney failure, unspecified: Secondary | ICD-10-CM | POA: Diagnosis not present

## 2022-06-20 DIAGNOSIS — Z66 Do not resuscitate: Secondary | ICD-10-CM | POA: Diagnosis not present

## 2022-06-20 DIAGNOSIS — I83014 Varicose veins of right lower extremity with ulcer of heel and midfoot: Secondary | ICD-10-CM

## 2022-06-20 NOTE — Progress Notes (Signed)
Daily Progress Note   Patient Name: Jonathan Wise       Date: 06/20/2022 DOB: 02/01/1946  Age: 77 y.o. MRN#: [id number] Attending Physician: Rodena Goldmann, DO Primary Care Physician: Christain Sacramento, MD Admit Date: 07/02/2022 Length of Stay: 4 days  Reason for Consultation/Follow-up: Terminal Care  HPI/Patient Profile:  77 y.o. male  with past medical history of type 2 diabetes mellitus, A-fib on Eliquis, hyperlipidemia, chronic respiratory failure on 3 LPM of oxygen at baseline who was on hospice and now presented to the ED due to nausea on waking up this morning, this was followed with vomitus x 1 which was described as coffee-ground in color.  He was admitted on 06/24/2022 with possible GI bleed as well as AKI and presumed UTI. Patient is now comfort care.   PMT was consulted for comfort care/terminal care.   He was previously seen by palliative medicine in August and December 2021.  Subjective:   Subjective: Chart Reviewed. Updates received. Patient Assessed. Created space and opportunity for patient  and family to explore thoughts and feelings regarding current medical situation.  Today's Discussion: Today met with the patient and his wife at the bedside.  The patient seems more awake today, appears somewhat improved compared to yesterday.  Yesterday it was felt that he was potentially actively dying and would likely have an in-hospital death.  Today he appears more stable.  He does wake up and answer questions with prolonged delay.  I provided a moist sponge to help bite his mouth.  Given his stability we asked how he would feel about going back home with hospice.  After significant discussion with the patient's wife, with input from the patient as tolerated, he was clear and his desire for transition to the hospice home.  He is currently established with [facility] hospice, per his wife.  I informed him that I would reach out to the patient's hospitalist and TOC/social worker to see if  we can get an evaluation for residential hospice.  He does note some nausea today and I requested the nurse to bring in some antiemetic medication.  I provided emotional and general support through therapeutic listening, empathy, sharing of stories, therapeutic touch, and other techniques. I answered all questions and addressed all concerns to the best of my ability.  Review of Systems  Constitutional:  Positive for fatigue.  Respiratory:  Negative for shortness of breath.   Gastrointestinal:  Positive for nausea. Negative for abdominal pain and vomiting.    Objective:   Vital Signs:  BP 94/69 (BP Location: Right Arm)   Pulse (!) 149   Temp 98.1 F (36.7 C) (Oral)   Resp 18   Ht '6\' 1"'$  (1.854 m)   Wt 129.6 kg   SpO2 98%   BMI 37.70 kg/m   Physical Exam: Physical Exam Vitals and nursing note reviewed.  Constitutional:      General: He is sleeping. He is not in acute distress.    Appearance: He is ill-appearing.     Comments: Arouses to voice/touch with delay  HENT:     Head: Normocephalic and atraumatic.  Cardiovascular:     Rate and Rhythm: Tachycardia present.  Pulmonary:     Effort: Pulmonary effort is normal. No respiratory distress.  Abdominal:     Palpations: Abdomen is soft.  Skin:    General: Skin is warm and dry.  Psychiatric:        Mood and Affect: Mood normal.  Behavior: Behavior normal.     Palliative Assessment/Data: 10%    Existing Vynca/ACP Documentation: DNR completed 12/27/2015  Assessment & Plan:   Impression: Present on Admission:  Acute kidney injury ([place])  Venous stasis ulcers ([place])  Diarrhea  Hypotension  SUMMARY OF RECOMMENDATIONS   Remain DNR Continued comfort care TOC consult for referral for residential hospice transition versus GIP PMT will continue to follow for symptom management needs  Symptom Management:  Tylenol 650 mg PR every 6 hours as needed mild pain or fever Robinul 0.2 mg IV every 4 hours as needed  excessive secretions Ativan 1 mg IV every 4 hours as needed anxiety Morphine 1 mg IV every 2 hours as needed severe pain or dyspnea Zofran 4 mg IV every 6 hours as needed nausea  Code Status: DNR  Prognosis: < 2 weeks  Discharge Planning: To Be Determined  Discussed with: Patient, family, medical team, nursing team, [facility] team  Thank you for allowing [place] to participate in the care of ELSWORTH CASTEEL PMT will continue to support holistically.  Billing based on MDM: High  Problems Addressed: One acute or chronic illness or injury that poses a threat to life or bodily function  Amount and/or Complexity of Data: Category 3:Discussion of management or test interpretation with external physician/other qualified health care professional/appropriate source (not separately reported)  Risks: Parenteral controlled substances   Walden Field, NP Palliative Medicine Team  Team Phone # [phone] (Nights/Weekends)  12/07/2020, 8:17 AM

## 2022-06-20 NOTE — Progress Notes (Signed)
Patient rested well during this shift. No PRN medications given.

## 2022-06-20 NOTE — Progress Notes (Signed)
PROGRESS NOTE    Jonathan Wise  [id number] DOB: 12-Nov-1945 DOA: 06/23/2022 PCP: Christain Sacramento, MD   Brief Narrative:   Jonathan Wise is a 77 y.o. male with medical history significant of type 2 diabetes mellitus, A-fib on Eliquis, hyperlipidemia, chronic respiratory failure on 3 LPM of oxygen at baseline who was on hospice and now presented to the ED due to nausea on waking up this morning, this was followed with vomitus x 1 which was described as coffee-ground in color.  Patient was admitted with possible GI bleed as well as AKI and presumed UTI.  Patient is now comfort care.  Assessment & Plan:   Principal Problem:   Acute kidney injury ([place]) Active Problems:   Hypotension   Venous stasis ulcers (HCC)   Diarrhea   Dehydration   Chronic respiratory failure with hypoxia (HCC)   GI bleed   Sacral decubitus ulcer  Assessment and Plan:  Worsening hypotension in the setting of possible GI bleed with AKI as well as UTI.  Patient now transition to comfort care with anticipated in-hospital death expected.  Prognosis of hours to days.  He is not stable for transfer.   DVT prophylaxis:SCDs Code Status: DNR/comfort care Family Communication: Discussed with granddaughter on phone 3/9, wife at bedside 3/11 Disposition Plan: Anticipated in-hospital death Status is: Inpatient Remains inpatient appropriate because: Need for IV fluids and medications.  Skin Assessment:  I have examined the patient's skin and I agree with the wound assessment as performed by the wound care RN as outlined below:  Pressure Injury 05/14/19 Ischial tuberosity Right Stage 2 -  Partial thickness loss of dermis presenting as a shallow open injury with a red, pink wound bed without slough. WOC updated FS to list left and right ischial wounds separately: copied and paste (Active)  05/14/19 [id number]  Location: Ischial tuberosity  Location Orientation: Right  Staging: Stage 2 -  Partial thickness loss of dermis  presenting as a shallow open injury with a red, pink wound bed without slough.  Wound Description (Comments): WOC updated FS to list left and right ischial wounds separately: copied and pasted this note from admitting RN  right black wound about 1 cm round on right buttock cheek. Entire buttock area red non blanchable   Present on Admission: Yes     Pressure Injury 06/18/19 Sacrum Stage 1 -  Intact skin with non-blanchable redness of a localized area usually over a bony prominence. excoriated redness noted to sacrum that extends to perineum and testicles.  (Active)  06/18/19 1309 (history of same from previous visit. present at time of arrival to ED per AM report.)  Location: Sacrum  Location Orientation:   Staging: Stage 1 -  Intact skin with non-blanchable redness of a localized area usually over a bony prominence.  Wound Description (Comments): excoriated redness noted to sacrum that extends to perineum and testicles.   Present on Admission: Yes (present at time of arrival to ED per AM report.)     Pressure Injury 06/17/22 Buttocks Left Stage 2 -  Partial thickness loss of dermis presenting as a shallow open injury with a red, pink wound bed without slough. (Active)  06/17/22 0056  Location: Buttocks  Location Orientation: Left  Staging: Stage 2 -  Partial thickness loss of dermis presenting as a shallow open injury with a red, pink wound bed without slough.  Wound Description (Comments):   Present on Admission: Yes  Dressing Type Foam - Lift dressing to assess site  every shift 06/20/22 0933     Pressure Injury 06/17/22 Buttocks Left Stage 2 -  Partial thickness loss of dermis presenting as a shallow open injury with a red, pink wound bed without slough. lowe inner buttocks (Active)  06/17/22 0056  Location: Buttocks  Location Orientation: Left  Staging: Stage 2 -  Partial thickness loss of dermis presenting as a shallow open injury with a red, pink wound bed without slough.  Wound  Description (Comments): lowe inner buttocks  Present on Admission: Yes  Dressing Type Foam - Lift dressing to assess site every shift 06/20/22 0933     Pressure Injury 06/17/22 Buttocks Right Deep Tissue Pressure Injury - Purple or maroon localized area of discolored intact skin or blood-filled blister due to damage of underlying soft tissue from pressure and/or shear. (Active)  06/17/22 0056  Location: Buttocks  Location Orientation: Right  Staging: Deep Tissue Pressure Injury - Purple or maroon localized area of discolored intact skin or blood-filled blister due to damage of underlying soft tissue from pressure and/or shear.  Wound Description (Comments):   Present on Admission: Yes  Dressing Type Foam - Lift dressing to assess site every shift 06/20/22 0933    Consultants:  Palliative care  Procedures:  None  Antimicrobials:  Anti-infectives (From admission, onward)    Start     Dose/Rate Route Frequency Ordered Stop   06/17/22 1000  cefTRIAXone (ROCEPHIN) 1 g in sodium chloride 0.9 % 100 mL IVPB  Status:  Discontinued        1 g 200 mL/hr over 30 Minutes Intravenous Every 24 hours 06/17/22 0412 06/17/22 0856   07/07/2022 2030  cefTRIAXone (ROCEPHIN) 2 g in sodium chloride 0.9 % 100 mL IVPB        2 g 200 mL/hr over 30 Minutes Intravenous  Once 06/15/2022 2019 06/23/2022 2151      Subjective: Patient seen and evaluated today with no new acute complaints or concerns. No acute concerns or events noted overnight.  Objective: Vitals:   06/19/22 0439 06/19/22 1350 06/20/22 0500 06/20/22 1329  BP: (!) 79/61 (!) 77/55 94/69 (!) 82/50  Pulse: (!) 111 (!) 128 (!) 149 (!) 147  Resp: '20 19 18 '$ (!) 24  Temp: 97.9 F (36.6 C) 98.4 F (36.9 C) 98.1 F (36.7 C) 97.9 F (36.6 C)  TempSrc:   Oral Oral  SpO2: 95% 98% 98% 97%  Weight:      Height:        Intake/Output Summary (Last 24 hours) at 06/20/2022 1432 Last data filed at 06/20/2022 1004 Gross per 24 hour  Intake 0 ml  Output  450 ml  Net -450 ml    Filed Weights   06/11/2022 1606 06/17/22 0056  Weight: 121.1 kg 129.6 kg    Examination:  General exam: Appears calm and comfortable, mostly unresponsive Respiratory system: Clear to auscultation. Respiratory effort normal.  1 L nasal cannula Cardiovascular system: S1 & S2 heard, RRR.  Gastrointestinal system: Abdomen is soft Central nervous system: Somnolent, unresponsive Extremities: No edema Skin: No significant lesions noted Psychiatry: Flat affect.    Data Reviewed: I have personally reviewed following labs and imaging studies  CBC: Recent Labs  Lab 06/13/2022 1615 06/17/22 0409  WBC 19.1* 21.2*  HGB 7.9* 7.8*  HCT 31.3* 31.7*  MCV 76.3* 77.9*  PLT 434* [id number]   Basic Metabolic Panel: Recent Labs  Lab 07/09/2022 1615 06/17/22 0409  NA 134* 137  K 4.3 4.3  CL 94* 99  CO2 27  24  GLUCOSE 153* 134*  BUN 45* 44*  CREATININE 2.60* 2.52*  CALCIUM 8.0* 7.9*  MG  --  2.4  PHOS  --  6.4*   GFR: Estimated Creatinine Clearance: 34.7 mL/min (A) (by C-G formula based on SCr of 2.52 mg/dL (H)). Liver Function Tests: Recent Labs  Lab 07/04/2022 1615 06/17/22 0409  AST 24 20  ALT 12 11  ALKPHOS 78 75  BILITOT 1.4* 1.1  PROT 6.7 6.6  ALBUMIN 2.5* 2.6*   Recent Labs  Lab 06/30/2022 1615  LIPASE 30   No results for input(s): "AMMONIA" in the last 168 hours. Coagulation Profile: No results for input(s): "INR", "PROTIME" in the last 168 hours. Cardiac Enzymes: No results for input(s): "CKTOTAL", "CKMB", "CKMBINDEX", "TROPONINI" in the last 168 hours. BNP (last 3 results) No results for input(s): "PROBNP" in the last 8760 hours. HbA1C: No results for input(s): "HGBA1C" in the last 72 hours.  CBG: Recent Labs  Lab 06/17/22 0042  GLUCAP 130*   Lipid Profile: No results for input(s): "CHOL", "HDL", "LDLCALC", "TRIG", "CHOLHDL", "LDLDIRECT" in the last 72 hours. Thyroid Function Tests: No results for input(s): "TSH", "T4TOTAL", "FREET4",  "T3FREE", "THYROIDAB" in the last 72 hours. Anemia Panel: No results for input(s): "VITAMINB12", "FOLATE", "FERRITIN", "TIBC", "IRON", "RETICCTPCT" in the last 72 hours. Sepsis Labs: Recent Labs  Lab 06/27/2022 1616 07/06/2022 1854  LATICACIDVEN 2.3* 1.1    Recent Results (from the past 240 hour(s))  Urine Culture     Status: Abnormal   Collection Time: 06/18/2022  4:52 PM   Specimen: Urine, Catheterized  Result Value Ref Range Status   Specimen Description   Final    URINE, CATHETERIZED Performed at [facility], [address], [place], [place] [address]    Special Requests   Final    NONE Performed at [facility], [address], [place], [place] [address]    Culture MULTIPLE SPECIES PRESENT, SUGGEST RECOLLECTION (A)  Final   Report Status 06/18/2022 FINAL  Final  Culture, blood (routine x 2)     Status: None (Preliminary result)   Collection Time: 06/30/2022  5:25 PM   Specimen: BLOOD RIGHT WRIST  Result Value Ref Range Status   Specimen Description   Final    BLOOD RIGHT WRIST BOTTLES DRAWN AEROBIC AND ANAEROBIC   Special Requests Blood Culture adequate volume  Final   Culture   Final    NO GROWTH 3 DAYS Performed at [facility], [address], [place], [place] [address]    Report Status PENDING  Incomplete  Culture, blood (routine x 2)     Status: None (Preliminary result)   Collection Time: 06/29/2022  5:25 PM   Specimen: Left Antecubital; Blood  Result Value Ref Range Status   Specimen Description   Final    LEFT ANTECUBITAL BOTTLES DRAWN AEROBIC AND ANAEROBIC   Special Requests Blood Culture adequate volume  Final   Culture   Final    NO GROWTH 3 DAYS Performed at [facility], [address], [place], [place] [address]    Report Status PENDING  Incomplete  Resp panel by RT-PCR (RSV, Flu A&B, Covid) Anterior Nasal Swab     Status: None   Collection Time: 06/28/2022  6:01 PM   Specimen: Anterior Nasal Swab  Result Value Ref Range Status   SARS Coronavirus 2 by RT PCR  NEGATIVE NEGATIVE Final    Comment: (NOTE) SARS-CoV-2 target nucleic acids are NOT DETECTED.  The SARS-CoV-2 RNA is generally detectable in upper  respiratory specimens during the acute phase of infection. The lowest concentration of SARS-CoV-2 viral copies this assay can detect is 138 copies/mL. A negative result does not preclude SARS-Cov-2 infection and should not be used as the sole basis for treatment or other patient management decisions. A negative result may occur with  improper specimen collection/handling, submission of specimen other than nasopharyngeal swab, presence of viral mutation(s) within the areas targeted by this assay, and inadequate number of viral copies(<138 copies/mL). A negative result must be combined with clinical observations, patient history, and epidemiological information. The expected result is Negative.  Fact Sheet for Patients:  EntrepreneurPulse.com.au  Fact Sheet for Healthcare Providers:  IncredibleEmployment.be  This test is no t yet approved or cleared by the [place] FDA and  has been authorized for detection and/or diagnosis of SARS-CoV-2 by FDA under an Emergency Use Authorization (EUA). This EUA will remain  in effect (meaning this test can be used) for the duration of the COVID-19 declaration under Section 564(b)(1) of the Act, 21 U.S.C.section 360bbb-3(b)(1), unless the authorization is terminated  or revoked sooner.       Influenza A by PCR NEGATIVE NEGATIVE Final   Influenza B by PCR NEGATIVE NEGATIVE Final    Comment: (NOTE) The Xpert Xpress SARS-CoV-2/FLU/RSV plus assay is intended as an aid in the diagnosis of influenza from Nasopharyngeal swab specimens and should not be used as a sole basis for treatment. Nasal washings and aspirates are unacceptable for Xpert Xpress SARS-CoV-2/FLU/RSV testing.  Fact Sheet for Patients: EntrepreneurPulse.com.au  Fact Sheet for  Healthcare Providers: IncredibleEmployment.be  This test is not yet approved or cleared by the [place] FDA and has been authorized for detection and/or diagnosis of SARS-CoV-2 by FDA under an Emergency Use Authorization (EUA). This EUA will remain in effect (meaning this test can be used) for the duration of the COVID-19 declaration under Section 564(b)(1) of the Act, 21 U.S.C. section 360bbb-3(b)(1), unless the authorization is terminated or revoked.     Resp Syncytial Virus by PCR NEGATIVE NEGATIVE Final    Comment: (NOTE) Fact Sheet for Patients: EntrepreneurPulse.com.au  Fact Sheet for Healthcare Providers: IncredibleEmployment.be  This test is not yet approved or cleared by the [place] FDA and has been authorized for detection and/or diagnosis of SARS-CoV-2 by FDA under an Emergency Use Authorization (EUA). This EUA will remain in effect (meaning this test can be used) for the duration of the COVID-19 declaration under Section 564(b)(1) of the Act, 21 U.S.C. section 360bbb-3(b)(1), unless the authorization is terminated or revoked.  Performed at [facility], [address] [place], [place] [address]   MRSA Next Gen by PCR, Nasal     Status: Abnormal   Collection Time: 06/17/22 12:30 AM   Specimen: Nasal Mucosa; Nasal Swab  Result Value Ref Range Status   MRSA by PCR Next Gen DETECTED (A) NOT DETECTED Final    Comment: RESULT CALLED TO, READ BACK BY AND VERIFIED WITH: HYLTON L @ [id number] ON [id number] BY HENDERSON L (NOTE) The GeneXpert MRSA Assay (FDA approved for NASAL specimens only), is one component of a comprehensive MRSA colonization surveillance program. It is not intended to diagnose MRSA infection nor to guide or monitor treatment for MRSA infections. Test performance is not FDA approved in patients less than 25 years old. Performed at [facility], [address], [place], [place] [address]           Radiology Studies: No results found.      Scheduled Meds:  Chlorhexidine Gluconate Cloth  6 each Topical Q0600   feeding supplement  237 mL Oral BID BM   ondansetron (ZOFRAN) IV  4 mg Intravenous Once       LOS: 4 days    Time spent: 35 minutes    Maleak Brazzel Darleen Crocker, DO [facility]  If 7PM-7AM, please contact night-coverage [email or web address] 06/20/2022, 2:32 PM

## 2022-06-20 NOTE — Progress Notes (Signed)
Patient alert with confusion. Reported some complaints of pain during shift PRN given, see MAR. Patient repositioned for comfort. Family at bedside.

## 2022-06-20 NOTE — TOC Progression Note (Signed)
Transition of Care [facility]) - Progression Note    Patient Details  Name: DEMARRIUS OVERALL MRN: [id number] Date of Birth: [birth year]  [facility] CM/SW Contact  Boneta Lucks, RN Phone Number: 06/20/2022, 1:18 PM  Clinical Narrative:   Referred to [place] for residential hospice or GIP. Nurse came to assess. TOC waiting to hear back from [place].    Expected Discharge Plan:  (expected hospital death) Barriers to Discharge: Continued Medical Work up  Expected Discharge Plan and Services      Living arrangements for the past 2 months: Single Family Home                   Social Determinants of Health (SDOH) Interventions SDOH Screenings   Tobacco Use: Medium Risk (07/01/2022)    Readmission Risk Interventions    06/17/2022    9:42 AM 03/10/2020    2:14 PM 12/07/2019    1:38 PM  Readmission Risk Prevention Plan  Transportation Screening Complete Complete   PCP or Specialist Appt within 5-7 Days Not Complete    Not Complete comments comfort care, anticipate hospital death    PCP or Specialist Appt within 3-5 Days   Complete  Home Care Screening Complete    Medication Review (RN CM) Complete    Medication Review (RN Care Manager)  Complete   HRI or [place]  Complete   SW Recovery Care/Counseling Consult  Complete   Palliative Care Screening  Complete   [place]  Not Applicable

## 2022-06-20 NOTE — TOC Progression Note (Signed)
Transition of Care [facility]) - Progression Note    Patient Details  Name: Jonathan Wise MRN: [id number] Date of Birth: 1945-07-22  Transition of Care [facility]) CM/SW Contact  Boneta Lucks, RN Phone Number: 06/20/2022, 3:08 PM  Clinical Narrative:   [facility] accepting for residential hospice. They will have a bed tomorrow. Family is concerned he is to low to move. MD explained he has been in the past. His pressures are improved today. We will need to reassess tomorrow to see if he can move or be GIP. TOC following.     Expected Discharge Plan:  (expected hospital death) Barriers to Discharge:  (referring to hospice)  Expected Discharge Plan and Services      Living arrangements for the past 2 months: Single Family Home                   Social Determinants of Health (SDOH) Interventions SDOH Screenings   Tobacco Use: Medium Risk (07/04/2022)    Readmission Risk Interventions    06/17/2022    9:42 AM 03/10/2020    2:14 PM 12/07/2019    1:38 PM  Readmission Risk Prevention Plan  Transportation Screening Complete Complete   PCP or Specialist Appt within 5-7 Days Not Complete    Not Complete comments comfort care, anticipate hospital death    PCP or Specialist Appt within 3-5 Days   Complete  Home Care Screening Complete    Medication Review (RN CM) Complete    Medication Review (RN Care Manager)  Complete   HRI or [place]  Complete   SW Recovery Care/Counseling Consult  Complete   Palliative Care Screening  Complete   [place]  Not Applicable

## 2022-06-21 DIAGNOSIS — K920 Hematemesis: Secondary | ICD-10-CM

## 2022-06-21 DIAGNOSIS — Z66 Do not resuscitate: Secondary | ICD-10-CM | POA: Diagnosis not present

## 2022-06-21 DIAGNOSIS — L89150 Pressure ulcer of sacral region, unstageable: Secondary | ICD-10-CM

## 2022-06-21 DIAGNOSIS — R112 Nausea with vomiting, unspecified: Secondary | ICD-10-CM | POA: Diagnosis not present

## 2022-06-21 DIAGNOSIS — J9611 Chronic respiratory failure with hypoxia: Secondary | ICD-10-CM | POA: Diagnosis not present

## 2022-06-21 DIAGNOSIS — Z515 Encounter for palliative care: Secondary | ICD-10-CM | POA: Diagnosis not present

## 2022-06-21 DIAGNOSIS — Z7189 Other specified counseling: Secondary | ICD-10-CM | POA: Diagnosis not present

## 2022-06-21 DIAGNOSIS — E860 Dehydration: Secondary | ICD-10-CM | POA: Diagnosis not present

## 2022-06-21 DIAGNOSIS — N179 Acute kidney failure, unspecified: Secondary | ICD-10-CM | POA: Diagnosis not present

## 2022-06-21 LAB — CULTURE, BLOOD (ROUTINE X 2)
Culture: NO GROWTH
Culture: NO GROWTH
Special Requests: ADEQUATE
Special Requests: ADEQUATE

## 2022-06-21 NOTE — Plan of Care (Signed)
  Problem: Clinical Measurements: Goal: Quality of life will improve 06/21/2022 2332 by Zadie Rhine, RN Outcome: Not Progressing

## 2022-06-21 NOTE — Plan of Care (Signed)
  Problem: Clinical Measurements: Goal: Quality of life will improve Outcome: Progressing   

## 2022-06-21 NOTE — Progress Notes (Signed)
Daily Progress Note   Patient Name: Jonathan Wise       Date: 06/21/2022 DOB: Mar 17, 1946  Age: 77 y.o. MRN#: [id number] Attending Physician: Jonathan Iba, MD Primary Care Physician: Jonathan Sacramento, MD Admit Date: 06/10/2022 Length of Stay: 5 days  Reason for Consultation/Follow-up: Terminal Care  HPI/Patient Profile:  77 y.o. male  with past medical history of type 2 diabetes mellitus, A-fib on Eliquis, hyperlipidemia, chronic respiratory failure on 3 LPM of oxygen at baseline who was on hospice and now presented to the ED due to nausea on waking up this morning, this was followed with vomitus x 1 which was described as coffee-ground in color.  He was admitted on 06/19/2022 with possible GI bleed as well as AKI and presumed UTI. Patient is now comfort care.   PMT was consulted for comfort care/terminal care.   He was previously seen by palliative medicine in August and December 2021.  Subjective:   Subjective: Chart Reviewed. Updates received. Patient Assessed. Created space and opportunity for patient  and family to explore thoughts and feelings regarding current medical situation.  Today's Discussion: Today saw the patient at the bedside, he is sleeping well.  He recently received Jonathan Wise for anxiety.  He opens his eyes when I call his name but promptly shuts them.  No signs or symptoms of discomfort or distress.  The patient's wife is at the bedside.  During our visit the chaplain from [facility] Wise came to visit.  Goal is for transfer to residential rehab when bed available.  When a bed becomes available we will evaluate for stability for transfer at that time.  In the meantime we will continue comfort care here.  All are in agreement with the plan.  I provided emotional and general support through therapeutic listening, empathy, sharing of stories, and other techniques. I answered all questions and addressed all concerns to the best of my ability.  Review of Systems  Unable  to perform ROS: Acuity of condition    Objective:   Vital Signs:  BP (!) 80/46 (BP Location: Left Arm)   Pulse (!) 156   Temp (!) 97.3 F (36.3 C) (Axillary)   Resp (!) 22   Ht '6\' 1"'$  (1.854 m)   Wt 129.6 kg   SpO2 92%   BMI 37.70 kg/m   Physical Exam: Physical Exam Vitals and nursing note reviewed.  Constitutional:      General: He is sleeping. He is not in acute distress. HENT:     Head: Normocephalic and atraumatic.  Cardiovascular:     Rate and Rhythm: Tachycardia present.  Pulmonary:     Effort: Pulmonary effort is normal. No respiratory distress.  Abdominal:     General: Abdomen is flat.     Palpations: Abdomen is soft.  Skin:    General: Skin is warm and dry.     Palliative Assessment/Data: 10%    Existing Vynca/ACP Documentation: DNR effective 12/27/2015  Assessment & Plan:   Impression: Present on Admission:  Acute kidney injury (Jonathan Wise)  Venous stasis ulcers (Jonathan Wise)  Diarrhea  Hypotension  SUMMARY OF RECOMMENDATIONS   Remain DNR Continue comfort care Waiting for bed availability at residential hospice Will evaluate for stability/safety of transfer when bed available PMT will continue to follow for symptom management needs See symptom management orders below  Symptom Management:  Tylenol 650 mg PR every 6 hours as needed mild pain or fever Jonathan Wise 0.2 mg IV every 4 hours as needed excessive secretions Jonathan Wise  1 mg IV every 4 hours as needed anxiety Morphine 1 mg IV every 2 hours as needed severe pain or dyspnea Jonathan Wise 4 mg IV every 6 hours as needed nausea  Code Status: DNR  Prognosis: < 2 weeks  Discharge Planning:  Hospice facility VS.  In-hospital death  Discussed with: Patient, patient's family, medical team, nursing team, Jonathan Wise team  Thank you for allowing [place] to participate in the care of Jonathan Wise PMT will continue to support holistically.  Billing based on MDM: High  Problems Addressed: One acute or chronic illness or injury  that poses a threat to life or bodily function  Amount and/or Complexity of Data: Category 3:Discussion of management or test interpretation with external physician/other qualified health care professional/appropriate source (not separately reported)  Risks: Parenteral controlled substances  Jonathan Field, NP Palliative Medicine Team  Team Phone # [phone] (Nights/Weekends)  12/07/2020, 8:17 AM

## 2022-06-21 NOTE — Plan of Care (Signed)

## 2022-06-21 NOTE — Plan of Care (Signed)
  Problem: Clinical Measurements: Goal: Quality of life will improve 06/21/2022 2327 by Zadie Rhine, RN Outcome: Progressing 06/21/2022 2317 by Zadie Rhine, RN Outcome: Progressing   Problem: Respiratory: Goal: Verbalizations of increased ease of respirations will increase 06/21/2022 2327 by Zadie Rhine, RN Outcome: Progressing 06/21/2022 2317 by Zadie Rhine, RN Outcome: Progressing   Problem: Role Relationship: Goal: Family's ability to cope with current situation will improve Outcome: Progressing

## 2022-06-21 NOTE — TOC Progression Note (Signed)
Transition of Care [facility]) - Progression Note    Patient Details  Name: Jonathan Wise MRN: [id number] Date of Birth: March 24, 1946  Transition of Care [facility]) CM/SW Contact  Boneta Lucks, RN Phone Number: 06/21/2022, 11:15 AM  Clinical Narrative:   No hospice bed available and patient can not be GIP, he is active with [facility]. GIP is for new referrals. This is a day by day bed status check and an assessment to see if patient is stable to move. TOC following.   Expected Discharge Plan:  (expected hospital death) Barriers to Discharge: Hospice Bed not available  Expected Discharge Plan and Services      Living arrangements for the past 2 months: Single Family Home                   Social Determinants of Health (SDOH) Interventions SDOH Screenings   Tobacco Use: Medium Risk (07/06/2022)    Readmission Risk Interventions    06/17/2022    9:42 AM 03/10/2020    2:14 PM 12/07/2019    1:38 PM  Readmission Risk Prevention Plan  Transportation Screening Complete Complete   PCP or Specialist Appt within 5-7 Days Not Complete    Not Complete comments comfort care, anticipate hospital death    PCP or Specialist Appt within 3-5 Days   Complete  Home Care Screening Complete    Medication Review (RN CM) Complete    Medication Review (RN Care Manager)  Complete   HRI or [place]  Complete   SW Recovery Care/Counseling Consult  Complete   Palliative Care Screening  Complete   [place]  Not Applicable

## 2022-06-21 NOTE — Progress Notes (Signed)
PROGRESS NOTE    Jonathan Wise  [id number] DOB: 02/07/1946 DOA: 06/14/2022 PCP: Christain Sacramento, MD   Brief Narrative:   Jonathan Wise is a 77 y.o. male with medical history significant of type 2 diabetes mellitus, A-fib on Eliquis, hyperlipidemia, chronic respiratory failure on 3 LPM of oxygen at baseline who was on hospice and now presented to the ED due to nausea on waking up this morning, this was followed with vomitus x 1 which was described as coffee-ground in color.  Patient was admitted with possible GI bleed as well as AKI and presumed UTI.  Patient is now comfort care.  Assessment & Plan:   Principal Problem:   Acute kidney injury ([place]) Active Problems:   Hypotension   Venous stasis ulcers (HCC)   Diarrhea   Dehydration   Chronic respiratory failure with hypoxia (HCC)   GI bleed   Sacral decubitus ulcer  Assessment and Plan:  Hypotension in the setting of GI bleed   AKI as well as UTI.  Continue full comfort measures.     DVT prophylaxis:SCDs Code Status: DNR/comfort care Family Communication: Discussed with granddaughter on phone 3/9, wife at bedside 3/11 Disposition Plan: Anticipated in-hospital death Status is: Inpatient Remains inpatient appropriate because: Need for IV fluids and medications.  Skin Assessment:  I have examined the patient's skin and I agree with the wound assessment as performed by the wound care RN as outlined below:  Pressure Injury 05/14/19 Ischial tuberosity Right Stage 2 -  Partial thickness loss of dermis presenting as a shallow open injury with a red, pink wound bed without slough. WOC updated FS to list left and right ischial wounds separately: copied and paste (Active)  05/14/19 [id number]  Location: Ischial tuberosity  Location Orientation: Right  Staging: Stage 2 -  Partial thickness loss of dermis presenting as a shallow open injury with a red, pink wound bed without slough.  Wound Description (Comments): WOC updated FS to  list left and right ischial wounds separately: copied and pasted this note from admitting RN  right black wound about 1 cm round on right buttock cheek. Entire buttock area red non blanchable   Present on Admission: Yes     Pressure Injury 06/18/19 Sacrum Stage 1 -  Intact skin with non-blanchable redness of a localized area usually over a bony prominence. excoriated redness noted to sacrum that extends to perineum and testicles.  (Active)  06/18/19 1309 (history of same from previous visit. present at time of arrival to ED per AM report.)  Location: Sacrum  Location Orientation:   Staging: Stage 1 -  Intact skin with non-blanchable redness of a localized area usually over a bony prominence.  Wound Description (Comments): excoriated redness noted to sacrum that extends to perineum and testicles.   Present on Admission: Yes (present at time of arrival to ED per AM report.)     Pressure Injury 06/17/22 Buttocks Left Stage 2 -  Partial thickness loss of dermis presenting as a shallow open injury with a red, pink wound bed without slough. (Active)  06/17/22 0056  Location: Buttocks  Location Orientation: Left  Staging: Stage 2 -  Partial thickness loss of dermis presenting as a shallow open injury with a red, pink wound bed without slough.  Wound Description (Comments):   Present on Admission: Yes  Dressing Type Foam - Lift dressing to assess site every shift;Alginate 06/20/22 2030     Pressure Injury 06/17/22 Buttocks Left Stage 2 -  Partial thickness  loss of dermis presenting as a shallow open injury with a red, pink wound bed without slough. lowe inner buttocks (Active)  06/17/22 0056  Location: Buttocks  Location Orientation: Left  Staging: Stage 2 -  Partial thickness loss of dermis presenting as a shallow open injury with a red, pink wound bed without slough.  Wound Description (Comments): lowe inner buttocks  Present on Admission: Yes  Dressing Type Foam - Lift dressing to assess site  every shift;Alginate 06/20/22 2030     Pressure Injury 06/17/22 Buttocks Right Deep Tissue Pressure Injury - Purple or maroon localized area of discolored intact skin or blood-filled blister due to damage of underlying soft tissue from pressure and/or shear. (Active)  06/17/22 0056  Location: Buttocks  Location Orientation: Right  Staging: Deep Tissue Pressure Injury - Purple or maroon localized area of discolored intact skin or blood-filled blister due to damage of underlying soft tissue from pressure and/or shear.  Wound Description (Comments):   Present on Admission: Yes  Dressing Type Foam - Lift dressing to assess site every shift;Alginate 06/20/22 2030    Consultants:  Palliative care  Procedures:  None  Antimicrobials:  Anti-infectives (From admission, onward)    Start     Dose/Rate Route Frequency Ordered Stop   06/17/22 1000  cefTRIAXone (ROCEPHIN) 1 g in sodium chloride 0.9 % 100 mL IVPB  Status:  Discontinued        1 g 200 mL/hr over 30 Minutes Intravenous Every 24 hours 06/17/22 0412 06/17/22 0856   06/21/2022 2030  cefTRIAXone (ROCEPHIN) 2 g in sodium chloride 0.9 % 100 mL IVPB        2 g 200 mL/hr over 30 Minutes Intravenous  Once 07/01/2022 2019 07/05/2022 2151      Subjective: Somnolent and unresponsive, appears terminally ill;   Objective: Vitals:   06/19/22 1350 06/20/22 0500 06/20/22 1329 06/21/22 0421  BP: (!) 77/55 94/69 (!) 82/50 (!) 80/46  Pulse: (!) 128 (!) 149 (!) 147 (!) 156  Resp: 19 18 (!) 24 (!) 22  Temp: 98.4 F (36.9 C) 98.1 F (36.7 C) 97.9 F (36.6 C) (!) 97.3 F (36.3 C)  TempSrc:  Oral Oral Axillary  SpO2: 98% 98% 97% 92%  Weight:      Height:       No intake or output data in the 24 hours ending 06/21/22 1447   Filed Weights   07/09/2022 1606 06/17/22 0056  Weight: 121.1 kg 129.6 kg   Examination:  General exam: Appears terminally ill; he appears gray in color of skin.  Respiratory system: Clear to auscultation. Respiratory  effort normal. Cardiovascular system: normal S1 & S2 heard.   Gastrointestinal system: Abdomen is soft, ND, no masses palpated;  Central nervous system: Somnolent, unresponsive Extremities: diffuse edema Skin: No significant lesions noted Psychiatry: UTD  Data Reviewed: I have personally reviewed following labs and imaging studies  CBC: Recent Labs  Lab 07/07/2022 1615 06/17/22 0409  WBC 19.1* 21.2*  HGB 7.9* 7.8*  HCT 31.3* 31.7*  MCV 76.3* 77.9*  PLT 434* [id number]   Basic Metabolic Panel: Recent Labs  Lab 07/07/2022 1615 06/17/22 0409  NA 134* 137  K 4.3 4.3  CL 94* 99  CO2 27 24  GLUCOSE 153* 134*  BUN 45* 44*  CREATININE 2.60* 2.52*  CALCIUM 8.0* 7.9*  MG  --  2.4  PHOS  --  6.4*   GFR: Estimated Creatinine Clearance: 34.7 mL/min (A) (by C-G formula based on SCr of 2.52 mg/dL (  H)). Liver Function Tests: Recent Labs  Lab 06/25/2022 1615 06/17/22 0409  AST 24 20  ALT 12 11  ALKPHOS 78 75  BILITOT 1.4* 1.1  PROT 6.7 6.6  ALBUMIN 2.5* 2.6*   Recent Labs  Lab 06/17/2022 1615  LIPASE 30   No results for input(s): "AMMONIA" in the last 168 hours. Coagulation Profile: No results for input(s): "INR", "PROTIME" in the last 168 hours. Cardiac Enzymes: No results for input(s): "CKTOTAL", "CKMB", "CKMBINDEX", "TROPONINI" in the last 168 hours. BNP (last 3 results) No results for input(s): "PROBNP" in the last 8760 hours. HbA1C: No results for input(s): "HGBA1C" in the last 72 hours.  CBG: Recent Labs  Lab 06/17/22 0042  GLUCAP 130*   Lipid Profile: No results for input(s): "CHOL", "HDL", "LDLCALC", "TRIG", "CHOLHDL", "LDLDIRECT" in the last 72 hours. Thyroid Function Tests: No results for input(s): "TSH", "T4TOTAL", "FREET4", "T3FREE", "THYROIDAB" in the last 72 hours. Anemia Panel: No results for input(s): "VITAMINB12", "FOLATE", "FERRITIN", "TIBC", "IRON", "RETICCTPCT" in the last 72 hours. Sepsis Labs: Recent Labs  Lab 07/09/2022 1616 06/15/2022 1854   LATICACIDVEN 2.3* 1.1    Recent Results (from the past 240 hour(s))  Urine Culture     Status: Abnormal   Collection Time: 07/07/2022  4:52 PM   Specimen: Urine, Catheterized  Result Value Ref Range Status   Specimen Description   Final    URINE, CATHETERIZED Performed at [facility], [address] [place], [place] [address]    Special Requests   Final    NONE Performed at [facility], [address], [place], [place] [address]    Culture MULTIPLE SPECIES PRESENT, SUGGEST RECOLLECTION (A)  Final   Report Status 06/18/2022 FINAL  Final  Culture, blood (routine x 2)     Status: None   Collection Time: 06/15/2022  5:25 PM   Specimen: BLOOD RIGHT WRIST  Result Value Ref Range Status   Specimen Description   Final    BLOOD RIGHT WRIST BOTTLES DRAWN AEROBIC AND ANAEROBIC Performed at [facility], [address], [place], [place] [address]    Special Requests   Final    Blood Culture adequate volume Performed at [facility], [address] [place], [place] [address]    Culture   Final    NO GROWTH 5 DAYS Performed at [facility], [place] [address], [place], [place] [address]    Report Status 06/21/2022 FINAL  Final  Culture, blood (routine x 2)     Status: None   Collection Time: 06/10/2022  5:25 PM   Specimen: Left Antecubital; Blood  Result Value Ref Range Status   Specimen Description   Final    LEFT ANTECUBITAL BOTTLES DRAWN AEROBIC AND ANAEROBIC Performed at [facility], [address], [place], [place] [address]    Special Requests   Final    Blood Culture adequate volume Performed at [facility], [address] [place], [place] [address]    Culture   Final    NO GROWTH 5 DAYS Performed at [facility], [place] [address], [place], [place] [address]    Report Status 06/21/2022 FINAL  Final  Resp panel by RT-PCR (RSV, Flu A&B, Covid) Anterior Nasal Swab     Status: None   Collection Time: 06/15/2022  6:01 PM   Specimen: Anterior Nasal Swab  Result Value Ref  Range Status   SARS Coronavirus 2 by RT PCR NEGATIVE NEGATIVE Final    Comment: (NOTE) SARS-CoV-2 target nucleic acids are  NOT DETECTED.  The SARS-CoV-2 RNA is generally detectable in upper respiratory specimens during the acute phase of infection. The lowest concentration of SARS-CoV-2 viral copies this assay can detect is 138 copies/mL. A negative result does not preclude SARS-Cov-2 infection and should not be used as the sole basis for treatment or other patient management decisions. A negative result may occur with  improper specimen collection/handling, submission of specimen other than nasopharyngeal swab, presence of viral mutation(s) within the areas targeted by this assay, and inadequate number of viral copies(<138 copies/mL). A negative result must be combined with clinical observations, patient history, and epidemiological information. The expected result is Negative.  Fact Sheet for Patients:  EntrepreneurPulse.com.au  Fact Sheet for Healthcare Providers:  IncredibleEmployment.be  This test is no t yet approved or cleared by the [place] FDA and  has been authorized for detection and/or diagnosis of SARS-CoV-2 by FDA under an Emergency Use Authorization (EUA). This EUA will remain  in effect (meaning this test can be used) for the duration of the COVID-19 declaration under Section 564(b)(1) of the Act, 21 U.S.C.section 360bbb-3(b)(1), unless the authorization is terminated  or revoked sooner.       Influenza A by PCR NEGATIVE NEGATIVE Final   Influenza B by PCR NEGATIVE NEGATIVE Final    Comment: (NOTE) The Xpert Xpress SARS-CoV-2/FLU/RSV plus assay is intended as an aid in the diagnosis of influenza from Nasopharyngeal swab specimens and should not be used as a sole basis for treatment. Nasal washings and aspirates are unacceptable for Xpert Xpress SARS-CoV-2/FLU/RSV testing.  Fact Sheet for  Patients: EntrepreneurPulse.com.au  Fact Sheet for Healthcare Providers: IncredibleEmployment.be  This test is not yet approved or cleared by the [place] FDA and has been authorized for detection and/or diagnosis of SARS-CoV-2 by FDA under an Emergency Use Authorization (EUA). This EUA will remain in effect (meaning this test can be used) for the duration of the COVID-19 declaration under Section 564(b)(1) of the Act, 21 U.S.C. section 360bbb-3(b)(1), unless the authorization is terminated or revoked.     Resp Syncytial Virus by PCR NEGATIVE NEGATIVE Final    Comment: (NOTE) Fact Sheet for Patients: EntrepreneurPulse.com.au  Fact Sheet for Healthcare Providers: IncredibleEmployment.be  This test is not yet approved or cleared by the [place] FDA and has been authorized for detection and/or diagnosis of SARS-CoV-2 by FDA under an Emergency Use Authorization (EUA). This EUA will remain in effect (meaning this test can be used) for the duration of the COVID-19 declaration under Section 564(b)(1) of the Act, 21 U.S.C. section 360bbb-3(b)(1), unless the authorization is terminated or revoked.  Performed at [facility], [address], [place], [place] [address]   MRSA Next Gen by PCR, Nasal     Status: Abnormal   Collection Time: 06/17/22 12:30 AM   Specimen: Nasal Mucosa; Nasal Swab  Result Value Ref Range Status   MRSA by PCR Next Gen DETECTED (A) NOT DETECTED Final    Comment: RESULT CALLED TO, READ BACK BY AND VERIFIED WITH: HYLTON L @ [id number] ON [id number] BY HENDERSON L (NOTE) The GeneXpert MRSA Assay (FDA approved for NASAL specimens only), is one component of a comprehensive MRSA colonization surveillance program. It is not intended to diagnose MRSA infection nor to guide or monitor treatment for MRSA infections. Test performance is not FDA approved in patients less than 59 years old. Performed  at [facility], [address] [place], [place] [address]     Radiology Studies: No results found.  Scheduled  Meds:  feeding supplement  237 mL Oral BID BM   ondansetron (ZOFRAN) IV  4 mg Intravenous Once    LOS: 5 days    Time spent: 35 minutes  Giavonna Pflum Wynetta Emery, MD [facility]  If 7PM-7AM, please contact night-coverage [email or web address] 06/21/2022, 2:47 PM

## 2022-06-22 DIAGNOSIS — I4891 Unspecified atrial fibrillation: Secondary | ICD-10-CM | POA: Diagnosis not present

## 2022-06-22 DIAGNOSIS — N179 Acute kidney failure, unspecified: Secondary | ICD-10-CM | POA: Diagnosis not present

## 2022-06-22 DIAGNOSIS — L89150 Pressure ulcer of sacral region, unstageable: Secondary | ICD-10-CM | POA: Diagnosis not present

## 2022-06-22 DIAGNOSIS — E860 Dehydration: Secondary | ICD-10-CM | POA: Diagnosis not present

## 2022-07-10 NOTE — Progress Notes (Signed)
AT 12:45 am rounding to check on pt noticed pt breathes were agonal and diminished. Stayed at bedside with pt and wife at bedside noticed pt did not respond to tactile stimulation or verbal response. Breathes ceased and called CN Charma Igo, RN to verify, at 1:07 am pt officially pronounced deceased; NO APICAL, CAROTID, FEMORAL PULSES AUSCULTATED OR PALPATED NOTED FOR A FULL ONE MINUTE.  MD aware [facility] made aware.  Pt family arrived at bedside. Post Mortem care completed. SRP, RN

## 2022-07-10 NOTE — Death Summary Note (Signed)
DEATH SUMMARY   Patient Details  Name: Jonathan Wise MRN: [id number] DOB: 10-10-1945  Admission/Discharge Information   Admit Date:  06/19/2022  Date of Death: Date of Death: 2022-06-25  Time of Death: Time of Death: 0107  Length of Stay: 6  Referring Physician: Christain Sacramento, MD   Reason(s) for Hospitalization   77 y.o. male with medical history significant of type 2 diabetes mellitus, A-fib on Eliquis, hyperlipidemia, chronic respiratory failure on 3 LPM of oxygen at baseline who was on hospice and now presented to the ED due to nausea on waking up this morning, this was followed with vomitus x 1 which was described as coffee-ground in color.  Patient was admitted with possible GI bleed as well as AKI and presumed UTI.  Patient was eventually transitioned to full comfort care after consultation with palliative medicine service.   Diagnoses  Preliminary cause of death: Gastrointestinal Hemorrhage Secondary Diagnoses (including complications and co-morbidities):  Principal Problem:   Acute kidney injury ([facility]) Active Problems:   Hypotension   Venous stasis ulcers (HCC)   Diarrhea   Dehydration   Chronic respiratory failure with hypoxia (HCC)   GI bleed   Sacral decubitus ulcer   Pertinent Labs and Studies  Significant Diagnostic Studies DG Chest Port 1 View  Result Date: 06/19/22 CLINICAL DATA:  Generalized weakness, vomiting EXAM: PORTABLE CHEST 1 VIEW COMPARISON:  03/09/2020 FINDINGS: Transverse diameter of heart is increased. There are no signs of alveolar pulmonary edema. There is haziness in right lower lung field suggesting pleural effusion and possibly underlying infiltrate. There is possible bulla in the medial right lower lung fields. There is interval improvement in the aeration in left lower lung fields with residual increased density. There is no pneumothorax. IMPRESSION: Cardiomegaly. There are no signs of pulmonary edema or new focal infiltrates. There is interval  improvement in the aeration in left lower lung fields suggesting decrease in pleural effusion and decrease in infiltrate. There is haziness in the lateral aspect of right lower lung field with interval improvement suggesting possible decrease in amount of right pleural effusion. Electronically Signed   By: Elmer Picker M.D.   On: Jun 19, 2022 18:00    Microbiology Recent Results (from the past 240 hour(s))  Urine Culture     Status: Abnormal   Collection Time: Jun 19, 2022  4:52 PM   Specimen: Urine, Catheterized  Result Value Ref Range Status   Specimen Description   Final    URINE, CATHETERIZED Performed at [facility], [address] [place], [place] [address]    Special Requests   Final    NONE Performed at [facility] [address] [place], [place] [address]    Culture MULTIPLE SPECIES PRESENT, SUGGEST RECOLLECTION (A)  Final   Report Status 06/18/2022 FINAL  Final  Culture, blood (routine x 2)     Status: None   Collection Time: 19-Jun-2022  5:25 PM   Specimen: BLOOD RIGHT WRIST  Result Value Ref Range Status   Specimen Description   Final    BLOOD RIGHT WRIST BOTTLES DRAWN AEROBIC AND ANAEROBIC Performed at [facility], [address] [place], [place] [address]    Special Requests   Final    Blood Culture adequate volume Performed at [facility], [address] [place], [place] [address]    Culture   Final    NO GROWTH 5 DAYS Performed at [facility], [place] [address], [place], [place] [address]    Report Status 06/21/2022 FINAL  Final  Culture, blood (routine x 2)     Status: None   Collection Time: 06/11/2022  5:25 PM   Specimen: Left Antecubital; Blood  Result Value Ref Range Status   Specimen Description   Final    LEFT ANTECUBITAL BOTTLES DRAWN AEROBIC AND ANAEROBIC Performed at [facility], [address], [place], [place] [address]    Special Requests   Final    Blood Culture adequate volume Performed at [facility], [address] [place],  [place] [address]    Culture   Final    NO GROWTH 5 DAYS Performed at [facility], [place] [address], [place], [place] [address]    Report Status 06/21/2022 FINAL  Final  Resp panel by RT-PCR (RSV, Flu A&B, Covid) Anterior Nasal Swab     Status: None   Collection Time: 07/06/2022  6:01 PM   Specimen: Anterior Nasal Swab  Result Value Ref Range Status   SARS Coronavirus 2 by RT PCR NEGATIVE NEGATIVE Final    Comment: (NOTE) SARS-CoV-2 target nucleic acids are NOT DETECTED.  The SARS-CoV-2 RNA is generally detectable in upper respiratory specimens during the acute phase of infection. The lowest concentration of SARS-CoV-2 viral copies this assay can detect is 138 copies/mL. A negative result does not preclude SARS-Cov-2 infection and should not be used as the sole basis for treatment or other patient management decisions. A negative result may occur with  improper specimen collection/handling, submission of specimen other than nasopharyngeal swab, presence of viral mutation(s) within the areas targeted by this assay, and inadequate number of viral copies(<138 copies/mL). A negative result must be combined with clinical observations, patient history, and epidemiological information. The expected result is Negative.  Fact Sheet for Patients:  EntrepreneurPulse.com.au  Fact Sheet for Healthcare Providers:  IncredibleEmployment.be  This test is no t yet approved or cleared by the [place] FDA and  has been authorized for detection and/or diagnosis of SARS-CoV-2 by FDA under an Emergency Use Authorization (EUA). This EUA will remain  in effect (meaning this test can be used) for the duration of the COVID-19 declaration under Section 564(b)(1) of the Act, 21 U.S.C.section 360bbb-3(b)(1), unless the authorization is terminated  or revoked sooner.       Influenza A by PCR NEGATIVE NEGATIVE Final   Influenza B by PCR NEGATIVE NEGATIVE Final     Comment: (NOTE) The Xpert Xpress SARS-CoV-2/FLU/RSV plus assay is intended as an aid in the diagnosis of influenza from Nasopharyngeal swab specimens and should not be used as a sole basis for treatment. Nasal washings and aspirates are unacceptable for Xpert Xpress SARS-CoV-2/FLU/RSV testing.  Fact Sheet for Patients: EntrepreneurPulse.com.au  Fact Sheet for Healthcare Providers: IncredibleEmployment.be  This test is not yet approved or cleared by the [place] FDA and has been authorized for detection and/or diagnosis of SARS-CoV-2 by FDA under an Emergency Use Authorization (EUA). This EUA will remain in effect (meaning this test can be used) for the duration of the COVID-19 declaration under Section 564(b)(1) of the Act, 21 U.S.C. section 360bbb-3(b)(1), unless the authorization is terminated or revoked.     Resp Syncytial Virus by PCR NEGATIVE NEGATIVE Final    Comment: (NOTE) Fact Sheet for Patients: EntrepreneurPulse.com.au  Fact Sheet for Healthcare Providers: IncredibleEmployment.be  This test is not yet approved or cleared by the [place] FDA and has been authorized for detection and/or diagnosis of SARS-CoV-2 by FDA under an Emergency Use Authorization (EUA). This EUA will remain in effect (meaning  this test can be used) for the duration of the COVID-19 declaration under Section 564(b)(1) of the Act, 21 U.S.C. section 360bbb-3(b)(1), unless the authorization is terminated or revoked.  Performed at [facility], [address] [place], [place] [address]   MRSA Next Gen by PCR, Nasal     Status: Abnormal   Collection Time: 06/17/22 12:30 AM   Specimen: Nasal Mucosa; Nasal Swab  Result Value Ref Range Status   MRSA by PCR Next Gen DETECTED (A) NOT DETECTED Final    Comment: RESULT CALLED TO, READ BACK BY AND VERIFIED WITH: HYLTON L @ [id number] ON [id number] BY HENDERSON L (NOTE) The GeneXpert  MRSA Assay (FDA approved for NASAL specimens only), is one component of a comprehensive MRSA colonization surveillance program. It is not intended to diagnose MRSA infection nor to guide or monitor treatment for MRSA infections. Test performance is not FDA approved in patients less than 18 years old. Performed at [facility], [address], [place], [place] [address]     Lab Basic Metabolic Panel: Recent Labs  Lab 07/01/2022 1615 06/17/22 0409  NA 134* 137  K 4.3 4.3  CL 94* 99  CO2 27 24  GLUCOSE 153* 134*  BUN 45* 44*  CREATININE 2.60* 2.52*  CALCIUM 8.0* 7.9*  MG  --  2.4  PHOS  --  6.4*   Liver Function Tests: Recent Labs  Lab 07/07/2022 1615 06/17/22 0409  AST 24 20  ALT 12 11  ALKPHOS 78 75  BILITOT 1.4* 1.1  PROT 6.7 6.6  ALBUMIN 2.5* 2.6*   Recent Labs  Lab 06/19/2022 1615  LIPASE 30   No results for input(s): "AMMONIA" in the last 168 hours. CBC: Recent Labs  Lab 06/29/2022 1615 06/17/22 0409  WBC 19.1* 21.2*  HGB 7.9* 7.8*  HCT 31.3* 31.7*  MCV 76.3* 77.9*  PLT 434* 390   Cardiac Enzymes: No results for input(s): "CKTOTAL", "CKMB", "CKMBINDEX", "TROPONINI" in the last 168 hours. Sepsis Labs: Recent Labs  Lab 07/07/2022 1615 06/29/2022 1616 07/05/2022 1854 06/17/22 0409  WBC 19.1*  --   --  21.2*  LATICACIDVEN  --  2.3* 1.1  --     Procedures/Operations     Nimah Uphoff 06/13/2022, 11:31 AM

## 2022-07-10 DEATH — deceased
